# Patient Record
Sex: Female | Born: 1950 | Race: Black or African American | Hispanic: No | Smoking: Former smoker
Health system: Southern US, Community
[De-identification: ages and names within clinical notes are randomized; demographics above are authoritative.]

## PROBLEM LIST (undated history)

## (undated) DIAGNOSIS — K219 Gastro-esophageal reflux disease without esophagitis: Secondary | ICD-10-CM

## (undated) DIAGNOSIS — G4700 Insomnia, unspecified: Secondary | ICD-10-CM

## (undated) DIAGNOSIS — M542 Cervicalgia: Secondary | ICD-10-CM

## (undated) DIAGNOSIS — F4323 Adjustment disorder with mixed anxiety and depressed mood: Secondary | ICD-10-CM

## (undated) DIAGNOSIS — I10 Essential (primary) hypertension: Secondary | ICD-10-CM

## (undated) DIAGNOSIS — B353 Tinea pedis: Secondary | ICD-10-CM

## (undated) DIAGNOSIS — T7840XA Allergy, unspecified, initial encounter: Secondary | ICD-10-CM

## (undated) DIAGNOSIS — F32A Depression, unspecified: Secondary | ICD-10-CM

## (undated) DIAGNOSIS — R0989 Other specified symptoms and signs involving the circulatory and respiratory systems: Secondary | ICD-10-CM

## (undated) DIAGNOSIS — F329 Major depressive disorder, single episode, unspecified: Secondary | ICD-10-CM

## (undated) HISTORY — DX: Essential (primary) hypertension: I10

## (undated) HISTORY — DX: Tinea pedis: B35.3

## (undated) HISTORY — DX: Major depressive disorder, single episode, unspecified: F32.9

## (undated) HISTORY — DX: Gastro-esophageal reflux disease without esophagitis: K21.9

## (undated) HISTORY — DX: Other specified symptoms and signs involving the circulatory and respiratory systems: R09.89

## (undated) HISTORY — DX: Adjustment disorder with mixed anxiety and depressed mood: F43.23

## (undated) HISTORY — DX: Depression, unspecified: F32.A

## (undated) HISTORY — DX: Allergy, unspecified, initial encounter: T78.40XA

## (undated) HISTORY — DX: Cervicalgia: M54.2

## (undated) HISTORY — PX: HEMORRHOID SURGERY: SHX153

## (undated) HISTORY — DX: Insomnia, unspecified: G47.00

---

## 1982-06-20 HISTORY — PX: OTHER SURGICAL HISTORY: SHX169

## 1986-06-20 HISTORY — PX: ABDOMINAL HYSTERECTOMY: SHX81

## 1999-02-03 ENCOUNTER — Other Ambulatory Visit: Admission: RE | Admit: 1999-02-03 | Discharge: 1999-02-03 | Payer: Self-pay | Admitting: Obstetrics & Gynecology

## 2000-06-16 ENCOUNTER — Encounter: Admission: RE | Admit: 2000-06-16 | Discharge: 2000-06-16 | Payer: Self-pay | Admitting: Internal Medicine

## 2000-06-16 ENCOUNTER — Encounter: Payer: Self-pay | Admitting: Internal Medicine

## 2000-09-22 ENCOUNTER — Encounter: Admission: RE | Admit: 2000-09-22 | Discharge: 2000-09-22 | Payer: Self-pay | Admitting: Internal Medicine

## 2000-09-22 ENCOUNTER — Encounter: Payer: Self-pay | Admitting: Internal Medicine

## 2001-07-19 ENCOUNTER — Encounter: Payer: Self-pay | Admitting: Obstetrics & Gynecology

## 2001-07-19 ENCOUNTER — Encounter: Admission: RE | Admit: 2001-07-19 | Discharge: 2001-07-19 | Payer: Self-pay | Admitting: Internal Medicine

## 2002-11-27 ENCOUNTER — Encounter: Payer: Self-pay | Admitting: Internal Medicine

## 2002-11-27 ENCOUNTER — Encounter: Admission: RE | Admit: 2002-11-27 | Discharge: 2002-11-27 | Payer: Self-pay | Admitting: Internal Medicine

## 2003-06-02 ENCOUNTER — Encounter: Admission: RE | Admit: 2003-06-02 | Discharge: 2003-06-02 | Payer: Self-pay | Admitting: Gastroenterology

## 2003-06-06 ENCOUNTER — Ambulatory Visit (HOSPITAL_COMMUNITY): Admission: RE | Admit: 2003-06-06 | Discharge: 2003-06-06 | Payer: Self-pay | Admitting: Gastroenterology

## 2004-01-29 ENCOUNTER — Encounter: Admission: RE | Admit: 2004-01-29 | Discharge: 2004-01-29 | Payer: Self-pay | Admitting: Internal Medicine

## 2004-08-04 ENCOUNTER — Ambulatory Visit: Payer: Self-pay | Admitting: Family Medicine

## 2004-08-05 ENCOUNTER — Ambulatory Visit (HOSPITAL_COMMUNITY): Admission: RE | Admit: 2004-08-05 | Discharge: 2004-08-05 | Payer: Self-pay | Admitting: Family Medicine

## 2004-09-15 ENCOUNTER — Ambulatory Visit: Payer: Self-pay | Admitting: Family Medicine

## 2004-10-05 ENCOUNTER — Ambulatory Visit: Payer: Self-pay | Admitting: Family Medicine

## 2005-01-14 ENCOUNTER — Ambulatory Visit: Payer: Self-pay | Admitting: Family Medicine

## 2005-03-04 ENCOUNTER — Ambulatory Visit: Payer: Self-pay | Admitting: Family Medicine

## 2005-07-05 ENCOUNTER — Ambulatory Visit: Payer: Self-pay | Admitting: Family Medicine

## 2005-08-01 ENCOUNTER — Encounter: Admission: RE | Admit: 2005-08-01 | Discharge: 2005-08-01 | Payer: Self-pay | Admitting: Obstetrics & Gynecology

## 2005-09-27 ENCOUNTER — Ambulatory Visit: Payer: Self-pay | Admitting: Gastroenterology

## 2005-10-04 ENCOUNTER — Other Ambulatory Visit: Admission: RE | Admit: 2005-10-04 | Discharge: 2005-10-04 | Payer: Self-pay | Admitting: Obstetrics & Gynecology

## 2005-11-01 ENCOUNTER — Ambulatory Visit: Payer: Self-pay | Admitting: Family Medicine

## 2006-08-10 ENCOUNTER — Ambulatory Visit: Payer: Self-pay | Admitting: Family Medicine

## 2006-12-13 ENCOUNTER — Ambulatory Visit: Payer: Self-pay | Admitting: Family Medicine

## 2007-01-23 ENCOUNTER — Other Ambulatory Visit: Admission: RE | Admit: 2007-01-23 | Discharge: 2007-01-23 | Payer: Self-pay | Admitting: Otolaryngology

## 2007-02-09 ENCOUNTER — Ambulatory Visit: Payer: Self-pay | Admitting: Gastroenterology

## 2007-02-23 ENCOUNTER — Ambulatory Visit: Payer: Self-pay | Admitting: Gastroenterology

## 2007-06-21 ENCOUNTER — Encounter: Payer: Self-pay | Admitting: Family Medicine

## 2007-08-14 ENCOUNTER — Ambulatory Visit: Payer: Self-pay | Admitting: Family Medicine

## 2007-08-17 ENCOUNTER — Encounter: Payer: Self-pay | Admitting: Family Medicine

## 2007-08-17 LAB — CONVERTED CEMR LAB
BUN: 10 mg/dL (ref 6–23)
Basophils Absolute: 0 10*3/uL (ref 0.0–0.1)
Basophils Relative: 0 % (ref 0–1)
CO2: 25 meq/L (ref 19–32)
Calcium: 9.2 mg/dL (ref 8.4–10.5)
Chloride: 104 meq/L (ref 96–112)
Cholesterol: 140 mg/dL (ref 0–200)
Creatinine, Ser: 0.79 mg/dL (ref 0.40–1.20)
Eosinophils Absolute: 0.1 10*3/uL (ref 0.0–0.7)
Eosinophils Relative: 1 % (ref 0–5)
Glucose, Bld: 95 mg/dL (ref 70–99)
HCT: 42.3 % (ref 36.0–46.0)
HDL: 51 mg/dL (ref >39)
Hemoglobin: 13.9 g/dL (ref 12.0–15.0)
LDL Cholesterol: 74 mg/dL (ref 0–99)
Lymphocytes Relative: 31 % (ref 12–46)
Lymphs Abs: 1.6 10*3/uL (ref 0.7–4.0)
MCHC: 32.9 g/dL (ref 30.0–36.0)
MCV: 93.8 fL (ref 78.0–100.0)
Monocytes Absolute: 0.4 10*3/uL (ref 0.1–1.0)
Monocytes Relative: 7 % (ref 3–12)
Neutro Abs: 3.1 10*3/uL (ref 1.7–7.7)
Neutrophils Relative %: 61 % (ref 43–77)
Platelets: 228 10*3/uL (ref 150–400)
Potassium: 4.2 meq/L (ref 3.5–5.3)
RBC: 4.51 M/uL (ref 3.87–5.11)
RDW: 12.8 % (ref 11.5–15.5)
Sodium: 141 meq/L (ref 135–145)
Total CHOL/HDL Ratio: 2.7
Triglycerides: 77 mg/dL (ref <150)
VLDL: 15 mg/dL (ref 0–40)
WBC: 5.2 10*3/uL (ref 4.0–10.5)

## 2008-04-29 ENCOUNTER — Ambulatory Visit: Payer: Self-pay | Admitting: Family Medicine

## 2008-05-01 ENCOUNTER — Encounter: Payer: Self-pay | Admitting: Family Medicine

## 2008-08-12 ENCOUNTER — Ambulatory Visit: Payer: Self-pay | Admitting: Family Medicine

## 2008-08-12 DIAGNOSIS — I10 Essential (primary) hypertension: Secondary | ICD-10-CM | POA: Insufficient documentation

## 2008-08-12 DIAGNOSIS — K219 Gastro-esophageal reflux disease without esophagitis: Secondary | ICD-10-CM | POA: Insufficient documentation

## 2008-08-13 ENCOUNTER — Telehealth: Payer: Self-pay | Admitting: Family Medicine

## 2008-08-21 DIAGNOSIS — F4323 Adjustment disorder with mixed anxiety and depressed mood: Secondary | ICD-10-CM | POA: Insufficient documentation

## 2008-08-21 DIAGNOSIS — J309 Allergic rhinitis, unspecified: Secondary | ICD-10-CM | POA: Insufficient documentation

## 2008-08-26 ENCOUNTER — Encounter: Payer: Self-pay | Admitting: Family Medicine

## 2008-08-26 LAB — CONVERTED CEMR LAB
BUN: 12 mg/dL (ref 6–23)
Band Neutrophils: 0 % (ref 0–10)
Basophils Absolute: 0 10*3/uL (ref 0.0–0.1)
Basophils Relative: 1 % (ref 0–1)
CO2: 29 meq/L (ref 19–32)
Calcium: 9.4 mg/dL (ref 8.4–10.5)
Chloride: 102 meq/L (ref 96–112)
Cholesterol: 143 mg/dL (ref 0–200)
Creatinine, Ser: 0.76 mg/dL (ref 0.40–1.20)
Eosinophils Absolute: 0.2 10*3/uL (ref 0.0–0.7)
Eosinophils Relative: 4 % (ref 0–5)
Glucose, Bld: 91 mg/dL (ref 70–99)
HCT: 41.4 % (ref 36.0–46.0)
HDL: 50 mg/dL (ref >39)
Hemoglobin: 13.4 g/dL (ref 12.0–15.0)
LDL Cholesterol: 75 mg/dL (ref 0–99)
Lymphocytes Relative: 49 % — ABNORMAL HIGH (ref 12–46)
Lymphs Abs: 2.5 10*3/uL (ref 0.7–4.0)
MCHC: 32.4 g/dL (ref 30.0–36.0)
MCV: 93.5 fL (ref 78.0–100.0)
Monocytes Absolute: 0.5 10*3/uL (ref 0.1–1.0)
Monocytes Relative: 9 % (ref 3–12)
Neutro Abs: 1.9 10*3/uL (ref 1.7–7.7)
Neutrophils Relative %: 38 % — ABNORMAL LOW (ref 43–77)
Platelets: 226 10*3/uL (ref 150–400)
Potassium: 4.1 meq/L (ref 3.5–5.3)
RBC: 4.43 M/uL (ref 3.87–5.11)
RDW: 12.7 % (ref 11.5–15.5)
Sodium: 141 meq/L (ref 135–145)
Total CHOL/HDL Ratio: 2.9
Triglycerides: 90 mg/dL (ref <150)
VLDL: 18 mg/dL (ref 0–40)
Vit D, 25-Hydroxy: 33 ng/mL (ref 30–89)
WBC: 5 10*3/uL (ref 4.0–10.5)

## 2008-09-03 ENCOUNTER — Telehealth: Payer: Self-pay | Admitting: Family Medicine

## 2008-09-15 ENCOUNTER — Telehealth: Payer: Self-pay | Admitting: Family Medicine

## 2008-10-02 ENCOUNTER — Encounter: Payer: Self-pay | Admitting: Family Medicine

## 2008-10-02 DIAGNOSIS — K644 Residual hemorrhoidal skin tags: Secondary | ICD-10-CM | POA: Insufficient documentation

## 2008-10-02 DIAGNOSIS — K5900 Constipation, unspecified: Secondary | ICD-10-CM | POA: Insufficient documentation

## 2008-10-03 ENCOUNTER — Encounter: Payer: Self-pay | Admitting: Family Medicine

## 2008-10-03 ENCOUNTER — Ambulatory Visit: Payer: Self-pay | Admitting: Gastroenterology

## 2008-10-16 ENCOUNTER — Encounter: Payer: Self-pay | Admitting: Family Medicine

## 2008-11-04 ENCOUNTER — Encounter: Payer: Self-pay | Admitting: Family Medicine

## 2009-02-16 ENCOUNTER — Telehealth: Payer: Self-pay | Admitting: Family Medicine

## 2009-06-02 ENCOUNTER — Telehealth: Payer: Self-pay | Admitting: Family Medicine

## 2009-06-27 ENCOUNTER — Emergency Department (HOSPITAL_COMMUNITY): Admission: EM | Admit: 2009-06-27 | Discharge: 2009-06-27 | Payer: Self-pay | Admitting: Emergency Medicine

## 2009-07-09 ENCOUNTER — Ambulatory Visit: Payer: Self-pay | Admitting: Family Medicine

## 2009-07-09 DIAGNOSIS — F418 Other specified anxiety disorders: Secondary | ICD-10-CM | POA: Insufficient documentation

## 2009-07-09 DIAGNOSIS — F5105 Insomnia due to other mental disorder: Secondary | ICD-10-CM

## 2009-07-09 DIAGNOSIS — F419 Anxiety disorder, unspecified: Secondary | ICD-10-CM | POA: Insufficient documentation

## 2009-07-09 DIAGNOSIS — G4700 Insomnia, unspecified: Secondary | ICD-10-CM | POA: Insufficient documentation

## 2009-07-15 ENCOUNTER — Telehealth: Payer: Self-pay | Admitting: Family Medicine

## 2009-07-23 ENCOUNTER — Ambulatory Visit: Payer: Self-pay | Admitting: Family Medicine

## 2009-09-24 ENCOUNTER — Telehealth: Payer: Self-pay | Admitting: Family Medicine

## 2009-10-20 ENCOUNTER — Ambulatory Visit (HOSPITAL_COMMUNITY): Admission: RE | Admit: 2009-10-20 | Discharge: 2009-10-20 | Payer: Self-pay | Admitting: Obstetrics & Gynecology

## 2009-12-08 ENCOUNTER — Ambulatory Visit (HOSPITAL_COMMUNITY): Admission: RE | Admit: 2009-12-08 | Discharge: 2009-12-08 | Payer: Self-pay | Admitting: Family Medicine

## 2009-12-08 ENCOUNTER — Ambulatory Visit: Payer: Self-pay | Admitting: Family Medicine

## 2009-12-08 DIAGNOSIS — R5381 Other malaise: Secondary | ICD-10-CM | POA: Insufficient documentation

## 2009-12-08 DIAGNOSIS — R5383 Other fatigue: Secondary | ICD-10-CM | POA: Insufficient documentation

## 2009-12-08 DIAGNOSIS — M542 Cervicalgia: Secondary | ICD-10-CM | POA: Insufficient documentation

## 2009-12-08 DIAGNOSIS — R0989 Other specified symptoms and signs involving the circulatory and respiratory systems: Secondary | ICD-10-CM | POA: Insufficient documentation

## 2009-12-11 ENCOUNTER — Ambulatory Visit (HOSPITAL_COMMUNITY): Admission: RE | Admit: 2009-12-11 | Discharge: 2009-12-11 | Payer: Self-pay | Admitting: Family Medicine

## 2009-12-17 ENCOUNTER — Encounter: Payer: Self-pay | Admitting: Family Medicine

## 2009-12-31 LAB — CONVERTED CEMR LAB
BUN: 13 mg/dL (ref 6–23)
Basophils Absolute: 0 10*3/uL (ref 0.0–0.1)
Basophils Relative: 1 % (ref 0–1)
CO2: 26 meq/L (ref 19–32)
Calcium: 9.4 mg/dL (ref 8.4–10.5)
Chloride: 106 meq/L (ref 96–112)
Cholesterol: 171 mg/dL (ref 0–200)
Creatinine, Ser: 0.83 mg/dL (ref 0.40–1.20)
Eosinophils Absolute: 0.1 10*3/uL (ref 0.0–0.7)
Eosinophils Relative: 3 % (ref 0–5)
Glucose, Bld: 89 mg/dL (ref 70–99)
HCT: 37.5 % (ref 36.0–46.0)
HDL: 52 mg/dL (ref >39)
Hemoglobin: 12.8 g/dL (ref 12.0–15.0)
LDL Cholesterol: 107 mg/dL — ABNORMAL HIGH (ref 0–99)
Lymphocytes Relative: 56 % — ABNORMAL HIGH (ref 12–46)
Lymphs Abs: 2.6 10*3/uL (ref 0.7–4.0)
MCHC: 34.1 g/dL (ref 30.0–36.0)
MCV: 94 fL (ref 78.0–100.0)
Monocytes Absolute: 0.3 10*3/uL (ref 0.1–1.0)
Monocytes Relative: 7 % (ref 3–12)
Neutro Abs: 1.6 10*3/uL — ABNORMAL LOW (ref 1.7–7.7)
Neutrophils Relative %: 34 % — ABNORMAL LOW (ref 43–77)
Platelets: 211 10*3/uL (ref 150–400)
Potassium: 4.3 meq/L (ref 3.5–5.3)
RBC: 3.99 M/uL (ref 3.87–5.11)
RDW: 12.6 % (ref 11.5–15.5)
Sodium: 142 meq/L (ref 135–145)
TSH: 2.508 microintl units/mL (ref 0.350–4.500)
Total CHOL/HDL Ratio: 3.3
Triglycerides: 58 mg/dL (ref <150)
VLDL: 12 mg/dL (ref 0–40)
WBC: 4.6 10*3/uL (ref 4.0–10.5)

## 2010-01-12 ENCOUNTER — Telehealth: Payer: Self-pay | Admitting: Family Medicine

## 2010-04-15 ENCOUNTER — Ambulatory Visit: Payer: Self-pay | Admitting: Family Medicine

## 2010-06-04 ENCOUNTER — Ambulatory Visit: Payer: Self-pay | Admitting: Family Medicine

## 2010-06-04 DIAGNOSIS — M810 Age-related osteoporosis without current pathological fracture: Secondary | ICD-10-CM | POA: Insufficient documentation

## 2010-06-05 DIAGNOSIS — B353 Tinea pedis: Secondary | ICD-10-CM | POA: Insufficient documentation

## 2010-06-07 ENCOUNTER — Encounter: Payer: Self-pay | Admitting: Family Medicine

## 2010-06-09 ENCOUNTER — Encounter: Payer: Self-pay | Admitting: Family Medicine

## 2010-06-23 ENCOUNTER — Ambulatory Visit (HOSPITAL_COMMUNITY)
Admission: RE | Admit: 2010-06-23 | Discharge: 2010-06-23 | Payer: Self-pay | Source: Home / Self Care | Attending: Family Medicine | Admitting: Family Medicine

## 2010-06-23 ENCOUNTER — Encounter: Payer: Self-pay | Admitting: Family Medicine

## 2010-06-29 ENCOUNTER — Encounter: Payer: Self-pay | Admitting: Family Medicine

## 2010-07-11 ENCOUNTER — Encounter: Payer: Self-pay | Admitting: Obstetrics & Gynecology

## 2010-07-22 NOTE — Letter (Signed)
Summary: phone notes  phone notes   Imported By: Curtis Sites 11/23/2009 10:00:42  _____________________________________________________________________  External Attachment:    Type:   Image     Comment:   External Document

## 2010-07-22 NOTE — Progress Notes (Signed)
Summary: REFILL/ NO REFILLS  Phone Note Call from Patient   Summary of Call: WANTS HER CLORAZEPATE 7.5 MG FILLED AT [facility] IN [place] CALL HER BACK TO LET HER KNOW Initial call taken by: Lind Guest,  February 16, 2009 1:30 PM  Follow-up for Phone Call        Phone Call Completed Follow-up by: Worthy Keeler LPN,  February 16, 2009 1:45 PM

## 2010-07-22 NOTE — Letter (Signed)
Summary: progress notes  progress notes   Imported By: Curtis Sites 11/23/2009 10:00:59  _____________________________________________________________________  External Attachment:    Type:   Image     Comment:   External Document

## 2010-07-22 NOTE — Miscellaneous (Signed)
Summary: Refill  Clinical Lists Changes  Medications: Added new medication of ACIPHEX 20 MG TBEC (RABEPRAZOLE SODIUM) Take 1 tablet by mouth once a day - Signed Added new medication of KEFLEX 500 MG CAPS (CEPHALEXIN) Take 1 tablet by mouth two times a day - Signed Rx of ACIPHEX 20 MG TBEC (RABEPRAZOLE SODIUM) Take 1 tablet by mouth once a day;  #30 x 3;  Signed;  Entered by: Everitt Amber;  Authorized by: Syliva Overman MD;  Method used: Handwritten Rx of KEFLEX 500 MG CAPS (CEPHALEXIN) Take 1 tablet by mouth two times a day;  #14 x 0;  Signed;  Entered by: Everitt Amber;  Authorized by: Syliva Overman MD;  Method used: Handwritten    Prescriptions: KEFLEX 500 MG CAPS (CEPHALEXIN) Take 1 tablet by mouth two times a day  #14 x 0   Entered by:   Everitt Amber   Authorized by:   Syliva Overman MD   Signed by:   Everitt Amber on 05/01/2008   Method used:   Handwritten   RxID:   [id number] ACIPHEX 20 MG TBEC (RABEPRAZOLE SODIUM) Take 1 tablet by mouth once a day  #30 x 3   Entered by:   Everitt Amber   Authorized by:   Syliva Overman MD   Signed by:   Everitt Amber on 05/01/2008   Method used:   Handwritten   RxID:   [id number]

## 2010-07-22 NOTE — Progress Notes (Signed)
  Phone Note Call from Patient   Caller: Patient Summary of Call: patient wants to know if you can increase her dose of prozac to 40mg  Initial call taken by: Adella Hare LPN,  January 12, 2010 2:43 PM  Follow-up for Phone Call        advise will inc the dose , but sh should also consider seeing a therapist , and will cerainly need an ov by sept, pls fax in writing dose inc the new dose Follow-up by: Syliva Overman MD,  January 12, 2010 3:52 PM  Additional Follow-up for Phone Call Additional follow up Details #1::        called patient, no answer Additional Follow-up by: Everitt Amber LPN,  January 12, 2010 4:37 PM    Additional Follow-up for Phone Call Additional follow up Details #2::    called patient, busy signal Follow-up by: Adella Hare LPN,  January 13, 2010 9:54 AM  Additional Follow-up for Phone Call Additional follow up Details #3:: Details for Additional Follow-up Action Taken: patient aware Additional Follow-up by: Adella Hare LPN,  January 13, 2010 3:02 PM  New/Updated Medications: FLUOXETINE HCL 40 MG CAPS (FLUOXETINE HCL) Take 1 capsule by mouth once a day Prescriptions: FLUOXETINE HCL 40 MG CAPS (FLUOXETINE HCL) Take 1 capsule by mouth once a day  #30 x 2   Entered and Authorized by:   Syliva Overman MD   Signed by:   Syliva Overman MD on 01/12/2010   Method used:   Printed then faxed to ...       [facility] [facility] (retail)       [address]       [place], [place]  [address]       Ph: [phone]       Fax: [phone]   RxID:   [id number]

## 2010-07-22 NOTE — Progress Notes (Signed)
  Phone Note Call from Patient   Caller: Patient Summary of Call: having anxiety during the day  [facility]. [phone] Initial call taken by: Worthy Keeler LPN,  July 15, 2009 4:13 PM  Follow-up for Phone Call        pls fax to [facility] , i discussed with pt she wants the tranxene for anxiety, and prob twice daily, same sent in Follow-up by: Syliva Overman MD,  July 16, 2009 6:25 PM

## 2010-07-22 NOTE — Assessment & Plan Note (Signed)
Summary: office visit   Vital Signs:  Patient profile:   60 year old female Height:      68 inches Weight:      184.25 pounds BMI:     28.12 O2 Sat:      98 % on Room air Pulse rate:   80 / minute Pulse rhythm:   regular Resp:     16 per minute BP sitting:   120 / 80  (left arm)  Vitals Entered By: Worthy Keeler LPN (July 09, 2009 10:06 AM)  Nutrition Counseling: Patient's BMI is greater than 25 and therefore counseled on weight management options.  O2 Flow:  Room air CC: follow-up visit Is Patient Diabetic? No Pain Assessment Patient in pain? no        Primary Care Provider:  Syliva Overman MD  CC:  follow-up visit.  History of Present Illness: states sjhe was taking extra asprin for a short period of time, wonders if this is a problem. Not taking BP meds regularly. Nort sleeping well and had not been taing clorazepate regularly.  Overall stress with dealing with her Momis worsening, she reports frank depression, but denies suividl or homicidal thoughts or hallucinations.She reports continued deterioratin in her living sitation, sometimes driving out and parking in a lot to eat, so she can have mental peace. she has not been exercising regulsrly , but sees this as a way to improve both physical and mental heah  Was  in the ED recently, reportedly not feelig well , she was told bP was high.  Home BP 184/108, hosp 162/108, then 142/94  Current Medications (verified): 1)  Tranxene-T 7.5 Mg Tabs (Clorazepate Dipotassium) .... One Tab By Mouth Qhs 2)  Flonase 50 Mcg/act Susp (Fluticasone Propionate) .... Uad 3)  Hydrochlorothiazide 25 Mg Tabs (Hydrochlorothiazide) .... One Tab By Mouth Qd 4)  Klor-Con M10 10 Meq Cr-Tabs (Potassium Chloride Crys Cr) .... One Tab By Mouth Qd 5)  Miralax   Powd (Polyethylene Glycol 3350) .... Mix One Capful in 8 Ounces of Water At Bedtime 6)  Ranitidine Hcl 150 Mg Tabs (Ranitidine Hcl) .... One Tab By Mouth Bid 7)  Aspirin 81 Mg Tabs  (Aspirin) .... One Tab By Mouth Qd  Allergies (verified): No Known Drug Allergies  Review of Systems      See HPI General:  Denies chills and fever. Eyes:  Denies blurring and discharge. ENT:  Denies hoarseness. CV:  Denies chest pain or discomfort, palpitations, and swelling of feet. Resp:  Denies cough and sputum productive. GI:  Denies abdominal pain, constipation, diarrhea, nausea, and vomiting. GU:  Denies dysuria and urinary frequency. Neuro:  Denies headaches, seizures, and sensation of room spinning. Psych:  Complains of anxiety and depression; denies sense of great danger, suicidal thoughts/plans, thoughts of violence, and unusual visions or sounds. Endo:  Denies cold intolerance, excessive hunger, excessive thirst, excessive urination, heat intolerance, polyuria, and weight change. Heme:  Denies abnormal bruising. Allergy:  Complains of seasonal allergies.  Physical Exam  General:  Well-developed,well-nourished,in no acute distress; alert,appropriate and cooperative throughout examination HEENT: No facial asymmetry,  EOMI, No sinus tenderness, TM's Clear, oropharynx  pink and moist.   Chest: Clear to auscultation bilaterally.  CVS: S1, S2, No murmurs, No S3.   Abd: Soft, Nontender.  MS: Adequate ROM spine, hips, shoulders and knees.  Ext: No edema.   CNS: CN 2-12 intact, power tone and sensation normal throughout.   Skin: Intact, no visible lesions or rashes.  Psych: Good eye  contact, normal affect.  Memory intact,  anxious and depressed appearing.    Impression & Recommendations:  Problem # 1:  INSOMNIA (ICD-780.52) Assessment Deteriorated  The following medications were removed from the medication list:    Temazepam 15 Mg Caps (Temazepam) .Marland Kitchen... Take 1 tab by mouth at bedtime  Discussed sleep hygiene. After the visit, pt called back stating that anxiety was her bigger problem requesting inc on tranxene instead of tamezepam.  Problem # 2:  DEPRESSION  (ICD-311) Assessment: Deteriorated  The following medications were removed from the medication list:    Tranxene-t 7.5 Mg Tabs (Clorazepate dipotassium) ..... One tab by mouth qhs Her updated medication list for this problem includes:    Prozac 10 Mg Caps (Fluoxetine hcl) .Marland Kitchen... Take 1 tablet by mouth once a day    Tranxene-t 7.5 Mg Tabs (Clorazepate dipotassium) .Marland Kitchen... Take 1 tablet by mouth two times a day as needed  Problem # 3:  HYPERTENSION (ICD-401.9) Assessment: Unchanged  Her updated medication list for this problem includes:    Hydrochlorothiazide 25 Mg Tabs (Hydrochlorothiazide) ..... One tab by mouth qd  BP today: 120/80 Prior BP: 110/70 (10/03/2008)  Labs Reviewed: K+: 4.1 (08/26/2008) Creat: : 0.76 (08/26/2008)   Chol: 143 (08/26/2008)   HDL: 50 (08/26/2008)   LDL: 75 (08/26/2008)   TG: 90 (08/26/2008)  Problem # 4:  ALLERGIC RHINITIS CAUSE UNSPECIFIED (ICD-477.9) Assessment: Unchanged  Her updated medication list for this problem includes:    Flonase 50 Mcg/act Susp (Fluticasone propionate) ..... Uad  Problem # 5:  GERD (ICD-530.81) Assessment: Unchanged  The following medications were removed from the medication list:    Omeprazole 20 Mg Cpdr (Omeprazole) ..... One tab by mouth bid    Nexium 40 Mg Cpdr (Esomeprazole magnesium) .Marland Kitchen... Take 1 tablet by mouth once a day Her updated medication list for this problem includes:    Ranitidine Hcl 150 Mg Tabs (Ranitidine hcl) ..... One tab by mouth bid  Complete Medication List: 1)  Flonase 50 Mcg/act Susp (Fluticasone propionate) .... Uad 2)  Hydrochlorothiazide 25 Mg Tabs (Hydrochlorothiazide) .... One tab by mouth qd 3)  Klor-con M10 10 Meq Cr-tabs (Potassium chloride crys cr) .... One tab by mouth qd 4)  Miralax Powd (Polyethylene glycol 3350) .... Mix one capful in 8 ounces of water at bedtime 5)  Ranitidine Hcl 150 Mg Tabs (Ranitidine hcl) .... One tab by mouth bid 6)  Aspirin 81 Mg Tabs (Aspirin) .... One tab by  mouth qd 7)  Prozac 10 Mg Caps (Fluoxetine hcl) .... Take 1 tablet by mouth once a day 8)  Tranxene-t 7.5 Mg Tabs (Clorazepate dipotassium) .... Take 1 tablet by mouth two times a day as needed  Patient Instructions: 1)  Please schedule a follow-up appointment in 4 months. 2)  It is important that you exercise regularly at least 20 minutes 5 times a week. If you develop chest pain, have severe difficulty breathing, or feel very tired , stop exercising immediately and seek medical attention. 3)  You need to lose weight. Consider a lower calorie diet and regular exercise.  Prescriptions: TRANXENE-T 7.5 MG TABS (CLORAZEPATE DIPOTASSIUM) Take 1 tablet by mouth two times a day as needed  #60 x 3   Entered and Authorized by:   Syliva Overman MD   Signed by:   Syliva Overman MD on 07/16/2009   Method used:   Printed then faxed to ...       [facility] (retail)  [address]       [place], [place]  [address]       Ph: [phone]       Fax: [phone]   RxID:   [id number] PROZAC 10 MG CAPS (FLUOXETINE HCL) Take 1 tablet by mouth once a day  #30 x 3   Entered by:   Everitt Amber   Authorized by:   Syliva Overman MD   Signed by:   Everitt Amber on 07/09/2009   Method used:   Handwritten   RxID:   [id number] TEMAZEPAM 15 MG CAPS (TEMAZEPAM) Take 1 tab by mouth at bedtime  #30 x 3   Entered by:   Everitt Amber   Authorized by:   Syliva Overman MD   Signed by:   Everitt Amber on 07/09/2009   Method used:   Handwritten   RxID:   [id number]

## 2010-07-22 NOTE — Progress Notes (Signed)
Summary: CALL BACK  Phone Note Call from Patient   Summary of Call: NEEDS FOR YOU TO CALL HER AT [phone] ABOUT MEDICINE Initial call taken by: Lind Guest,  September 24, 2009 11:26 AM  Follow-up for Phone Call        wants to know if she should be on an antibiotic for preventative measures against strep throat, since mother has had strep throat Follow-up by: Adella Hare LPN,  September 24, 2009 2:18 PM  Additional Follow-up for Phone Call Additional follow up Details #1::        not necessary, mother got rocephin inj in the office Additional Follow-up by: Syliva Overman MD,  September 24, 2009 3:01 PM    Additional Follow-up for Phone Call Additional follow up Details #2::    patient aware Follow-up by: Adella Hare LPN,  September 24, 2009 4:54 PM

## 2010-07-22 NOTE — Medication Information (Signed)
Summary: Tax adviser   Imported By: Lind Guest 10/16/2008 08:03:06  _____________________________________________________________________  External Attachment:    Type:   Image     Comment:   External Document

## 2010-07-22 NOTE — Letter (Signed)
Summary: misc.  misc.   Imported By: Curtis Sites 11/23/2009 10:00:24  _____________________________________________________________________  External Attachment:    Type:   Image     Comment:   External Document

## 2010-07-22 NOTE — Progress Notes (Signed)
Summary: meds  Phone Note Call from Patient   Summary of Call: has a question about her meds and needs to talk with nurse. [phone] Initial call taken by: Rudene Anda,  July 15, 2009 1:31 PM  Follow-up for Phone Call        advised patient to take meds at the same time daily Follow-up by: Worthy Keeler LPN,  July 15, 2009 4:11 PM

## 2010-07-22 NOTE — Progress Notes (Signed)
Summary: nurse  Phone Note Call from Patient   Summary of Call: needs to speak with nurse about some test. [phone] Initial call taken by: Rudene Anda,  June 02, 2009 4:04 PM  Follow-up for Phone Call        She wants to know what you think about the [employer] checks and if you think she should have it done. Its an organization where they travel around to churches etc and perform tests (one test checks for blockages in arteries) Wants to know if you think she should get one done. Told her I would ask you and call her back Follow-up by: Everitt Amber,  June 02, 2009 4:12 PM  Additional Follow-up for Phone Call Additional follow up Details #1::        yes I think it is a good screening test Additional Follow-up by: Syliva Overman MD,  June 03, 2009 9:24 AM    Additional Follow-up for Phone Call Additional follow up Details #2::    called pt, left message Follow-up by: Everitt Amber,  June 03, 2009 10:40 AM  Additional Follow-up for Phone Call Additional follow up Details #3:: Details for Additional Follow-up Action Taken: Patient aware Additional Follow-up by: Everitt Amber,  June 03, 2009 1:57 PM

## 2010-07-22 NOTE — Procedures (Signed)
Summary: Colon   Colonoscopy  Procedure date:  02/23/2007  Findings:      Location:  [facility].    Colonoscopy  Procedure date:  02/23/2007  Findings:      Location:  [facility].    Patient Name: Tiffany Mendoza, Tiffany Mendoza MRN:  Procedure Procedures: Colonoscopy CPT: [phone].  Personnel: Endoscopist: Vania Rea. Jarold Motto, MD.  Exam Location: Exam performed in Outpatient Clinic. Outpatient  Patient Consent: Procedure, Alternatives, Risks and Benefits discussed, consent obtained, from patient. Consent was obtained by the RN.  Indications Symptoms: Constipation Patient has difficulty evacuating, strains with stool passage. Hematochezia.  Increased Risk Screening: Family History of Polyps.  History  Current Medications: Patient is not currently taking Coumadin.  Medical/ Surgical History: Hypertension, Reflux Disease,  Pre-Exam Physical: Performed Feb 23, 2007. Cardio-pulmonary exam, Rectal exam, Abdominal exam, Extremity exam, Mental status exam WNL.  Comments: Pt. history reviewed/updated, physical exam performed prior to initiation of sedation? yes Exam Exam: Extent of exam reached: Cecum, extent intended: Cecum.  The cecum was identified by appendiceal orifice and IC valve. Patient position: on left side. Time to Cecum: 00:11. Time for Withdrawl: 00:08:18. Colon retroflexion performed. Images taken. ASA Classification: II. Tolerance: excellent.  Monitoring: Pulse and BP monitoring, Oximetry used. Supplemental O2 given. at 2 Liters.  Colon Prep Used Golytely for colon prep. Prep results: excellent.  Sedation Meds: Patient assessed and found to be appropriate for moderate (conscious) sedation. Sedation was managed by the Endoscopist. Monitored Anesthesia Care. Fentanyl 100 mcg. given IV. Versed 10 mg. given IV.  Instrument(s): CF 140L. Serial [id number].  Comments: Very redundant and tortuous colon noted.... Findings - NORMAL EXAM:  Cecum to Rectum. Not Seen: Polyps. AVM's. Colitis. Tumors. Melanosis. Crohn's. Diverticulosis.  - HEMORRHOIDS: External. Size: Medium. Not bleeding. Not thrombosed. ICD9: Hemorrhoids, External: 455.3.   Assessment  Diagnoses: 455.3: Hemorrhoids, External.   Comments: Chronic functional constipation.and external hemorrhoidal periodic bleeding,.... Events  Unplanned Interventions: No intervention was required.  Plans Medication Plan: Anti-constipation: Amitiza BID, starting Feb 23, 2007 for 7 days.   Patient Education: Patient given standard instructions for: Constipation. Comments: Local anal cream... Disposition: After procedure patient sent to recovery. After recovery patient sent home.  Scheduling/Referral: Follow-Up prn.    CC: Syliva Overman, MD  This report was created from the original endoscopy report, which was reviewed and signed by the above listed endoscopist.

## 2010-07-22 NOTE — Letter (Signed)
Summary: consult  consult   Imported By: Curtis Sites 11/23/2009 09:59:14  _____________________________________________________________________  External Attachment:    Type:   Image     Comment:   External Document

## 2010-07-22 NOTE — Assessment & Plan Note (Signed)
Summary: office visit   Vital Signs:  Patient profile:   60 year old female Height:      68 inches Weight:      174.50 pounds BMI:     26.63 O2 Sat:      96 % Pulse rate:   73 / minute Pulse rhythm:   regular Resp:     16 per minute BP sitting:   124 / 86  (left arm)  Vitals Entered By: Everitt Amber LPN (December 08, 2009 10:55 AM)  Nutrition Counseling: Patient's BMI is greater than 25 and therefore counseled on weight management options. CC: Follow up chronic problems, do you still want her to take the prozac. She couldn't tell a difference really since maybe it was a low dose.    Primary Care Provider:  Syliva Overman MD  CC:  Follow up chronic problems and do you still want her to take the prozac. She couldn't tell a difference really since maybe it was a low dose. Marland Kitchen  History of Present Illness: Reports  that tshe has been doing fairly well. 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 Denies sinus pressure, nasal congestion , ear pain or sore throat. Denies chest congestion, or cough productive of sputum. Denies chest pain, palpitations, PND, orthopnea or leg swelling. Denies abdominal pain, nausea, vomitting, diarrhea or constipation. Denies change in bowel movements or bloody stool. Denies dysuria , frequency, incontinence or hesitancy. Denies  joint pain, swelling, or reduced mobility. Denies headaches, vertigo, seizures.  Denies  rash, lesions, or itch.     Current Medications (verified): 1)  Hydrochlorothiazide 25 Mg Tabs (Hydrochlorothiazide) .... One Tab By Mouth Qd 2)  Potassium 99 Mg Tabs (Potassium) .... Take 1 Tablet By Mouth Once A Day 3)  Ranitidine Hcl 150 Mg Tabs (Ranitidine Hcl) .... One Tab By Mouth Bid 4)  Aspirin 81 Mg Tabs (Aspirin) .... One Tab By Mouth Qd 5)  Prozac 10 Mg Caps (Fluoxetine Hcl) .... Take 1 Tablet By Mouth Once A Day 6)  Tranxene-T 7.5 Mg Tabs  (Clorazepate Dipotassium) .... Take 1 Tablet By Mouth Two Times A Day As Needed  Allergies (verified): No Known Drug Allergies  Review of Systems      See HPI General:  Complains of fatigue. Eyes:  Denies blurring and discharge. MS:  Complains of joint pain and stiffness; 4 week h/o ioncreased neck pain and stiffness, concerned about possible blockage in her neck also. Psych:  Complains of anxiety and depression; denies suicidal thoughts/plans, thoughts of violence, unusual visions or sounds, and thoughts /plans of harming others; improived symptoms, however feels as though she would do better with an increased dose of med. Endo:  Denies excessive thirst and excessive urination. Heme:  Denies abnormal bruising and bleeding. Allergy:  Complains of seasonal allergies; denies hives or rash and itching eyes; mild to moderate, controlled with as needed meds.  Physical Exam  General:  Well-developed,well-nourished,in no acute distress; alert,appropriate and cooperative throughout examination HEENT: No facial asymmetry,  EOMI, No sinus tenderness, TM's Clear, oropharynx  pink and moist. Carotid bruit  Chest: Clear to auscultation bilaterally.  CVS: S1, S2, No murmurs, No S3.   Abd: Soft, Nontender.  MS: Adequate ROM spine, hips, shoulders and knees.  Ext: No edema.   CNS: CN 2-12 intact, power tone and sensation normal throughout.   Skin: Intact, no visible lesions or rashes.  Psych: Good eye contact,  normal affect.  Memory intact, not anxious or depressed appearing.'    Impression & Recommendations:  Problem # 1:  NECK PAIN (ICD-723.1) Assessment Deteriorated  Her updated medication list for this problem includes:    Aspirin 81 Mg Tabs (Aspirin) ..... One tab by mouth qd  Orders: Diagnostic X-Ray/Fluoroscopy (Diagnostic X-Ray/Flu)  Problem # 2:  CAROTID BRUIT (ICD-785.9) Assessment: Deteriorated  Orders: Radiology Referral (Radiology)  Problem # 3:  DEPRESSION  (ICD-311) Assessment: Improved  The following medications were removed from the medication list:    Prozac 10 Mg Caps (Fluoxetine hcl) .Marland Kitchen... Take 1 tablet by mouth once a day Her updated medication list for this problem includes:    Tranxene-t 7.5 Mg Tabs (Clorazepate dipotassium) .Marland Kitchen... Take 1 tablet by mouth two times a day as needed    Fluoxetine Hcl 20 Mg Caps (Fluoxetine hcl) .Marland Kitchen... Take 1 capsule by mouth once a day  Problem # 4:  INSOMNIA (ICD-780.52) Assessment: Unchanged  Discussed sleep hygiene. Relies on tranxene  Problem # 5:  ALLERGIC RHINITIS CAUSE UNSPECIFIED (ICD-477.9) Assessment: Deteriorated  The following medications were removed from the medication list:    Flonase 50 Mcg/act Susp (Fluticasone propionate) ..... Uad  Problem # 6:  HYPERTENSION (ICD-401.9) Assessment: Unchanged  Her updated medication list for this problem includes:    Hydrochlorothiazide 25 Mg Tabs (Hydrochlorothiazide) ..... One tab by mouth qd  Orders: T-Basic Metabolic Panel [phone])  BP today: 124/86 Prior BP: 120/80 (07/23/2009)  Labs Reviewed: K+: 4.1 (08/26/2008) Creat: : 0.76 (08/26/2008)   Chol: 143 (08/26/2008)   HDL: 50 (08/26/2008)   LDL: 75 (08/26/2008)   TG: 90 (08/26/2008)  Problem # 7:  GERD (ICD-530.81) Assessment: Improved  Her updated medication list for this problem includes:    Ranitidine Hcl 150 Mg Tabs (Ranitidine hcl) ..... One tab by mouth bid  Complete Medication List: 1)  Hydrochlorothiazide 25 Mg Tabs (Hydrochlorothiazide) .... One tab by mouth qd 2)  Potassium 99 Mg Tabs (Potassium) .... Take 1 tablet by mouth once a day 3)  Ranitidine Hcl 150 Mg Tabs (Ranitidine hcl) .... One tab by mouth bid 4)  Aspirin 81 Mg Tabs (Aspirin) .... One tab by mouth qd 5)  Tranxene-t 7.5 Mg Tabs (Clorazepate dipotassium) .... Take 1 tablet by mouth two times a day as needed 6)  Fluoxetine Hcl 20 Mg Caps (Fluoxetine hcl) .... Take 1 capsule by mouth once a day  Other  Orders: T-Lipid Profile ([id number]) T-CBC w/Diff ([id number]) T-TSH [id number])  Patient Instructions: 1)  Please schedule a follow-up appointment in 4.5 months. 2)  BMP prior to visit, ICD-9: 3)  Lipid Panel prior to visit, ICD-9:   fasting labs  this week 4)  CBC w/ Diff prior to visit, ICD-9: 5)  TSH 6)  Dose increase on the prozac as discussed. 7)  It is important that you exercise regularly at least30 minutes 5 times a week. If you develop chest pain, have severe difficulty breathing, or feel very tired , stop exercising immediately and seek medical attention. 8)  You need to lose weight. Consider a lower calorie diet and regular exercise. Congrats on your weight loss, plas keeop it up! Prescriptions: HYDROCHLOROTHIAZIDE 25 MG TABS (HYDROCHLOROTHIAZIDE) one tab by mouth qd  #30 Each x 4   Entered by:   Everitt Amber LPN   Authorized by:   Syliva Overman MD   Signed by:   Everitt Amber LPN on 28/41/3244   Method used:   Electronically to  [facility] [facility] (retail)       [address]       [place], [place]  [address]       Ph: [phone]       Fax: [phone]   RxID:   [id number] FLUOXETINE HCL 20 MG CAPS (FLUOXETINE HCL) Take 1 capsule by mouth once a day  #30 x 3   Entered and Authorized by:   Syliva Overman MD   Signed by:   Syliva Overman MD on 12/08/2009   Method used:   Printed then faxed to ...       [facility] [facility] (retail)       [address]       [place], [place]  [address]       Ph: [phone]       Fax: [phone]   RxID:   [id number]

## 2010-07-22 NOTE — Assessment & Plan Note (Signed)
Summary: OV   Vital Signs:  Patient profile:   60 year old female Height:      68 inches Weight:      165.25 pounds BMI:     25.22 O2 Sat:      98 % on Room air Pulse rate:   95 / minute Pulse rhythm:   regular Resp:     16 per minute BP sitting:   120 / 80  (left arm)  Vitals Entered By: Adella Hare LPN (June 04, 2010 11:36 AM)  Nutrition Counseling: Patient's BMI is greater than 25 and therefore counseled on weight management options.  O2 Flow:  Room air CC: follow-up visit Is Patient Diabetic? No Comments did not bring meds to ov   Primary Care Provider:  Syliva Overman MD  CC:  follow-up visit.  History of Present Illness: Reports  that thshe has been doing well. 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 Denies sinus pressure, nasal congestion , ear pain or sore throat. Denies chest congestion, or cough productive of sputum. Denies chest pain, palpitations, PND, orthopnea or leg swelling. Denies abdominal pain, nausea, vomitting, diarrhea or constipation. Denies change in bowel movements or bloody stool. Denies dysuria , frequency, incontinence or hesitancy. Denies  joint pain, swelling, or reduced mobility. Denies headaches, vertigo, seizures. Denies depression, anxiety or insomnia. c/o puritic peeling rash on the soles of both feet   Allergies: No Known Drug Allergies  Review of Systems      See HPI Eyes:  Denies discharge, eye pain, and red eye. Derm:  smelly feet wityh scaling in the past 1 month. Endo:  Denies cold intolerance, excessive hunger, excessive thirst, excessive urination, and heat intolerance. Heme:  Denies abnormal bruising and bleeding. Allergy:  Denies hives or rash and itching eyes.  Physical Exam  General:  Well-developed,well-nourished,in no acute distress; alert,appropriate and cooperative throughout examination HEENT: No facial asymmetry,  EOMI, No sinus  tenderness, TM's Clear, oropharynx  pink and moist.   Chest: Clear to auscultation bilaterally.  CVS: S1, S2, No murmurs, No S3.   Abd: Soft, Nontender.  MS: Adequate ROM spine, hips, shoulders and knees.  Ext: No edema.   CNS: CN 2-12 intact, power tone and sensation normal throughout.   Skin: tinea pedis bilaterally affecting the soles of both feet, scratch marks visible where pt has been digging and scratching Psych: Good eye contact, normal affect.  Memory intact, not anxious or depressed appearing.    Impression & Recommendations:  Problem # 1:  UNSPECIFIED OSTEOPOROSIS (ICD-733.00) Assessment Comment Only  Orders: Radiology Referral (Radiology)  Problem # 2:  INSOMNIA (ICD-780.52) Assessment: Improved  Discussed sleep hygiene.   Problem # 3:  DEPRESSION (ICD-311) Assessment: Improved  Her updated medication list for this problem includes:    Tranxene-t 7.5 Mg Tabs (Clorazepate dipotassium) .Marland Kitchen... Take 1 tablet by mouth two times a day as needed    Fluoxetine Hcl 40 Mg Caps (Fluoxetine hcl) .Marland Kitchen... Take 1 capsule by mouth once a day  Problem # 4:  HYPERTENSION (ICD-401.9) Assessment: Unchanged  Her updated medication list for this problem includes:    Hydrochlorothiazide 25 Mg Tabs (Hydrochlorothiazide) ..... One tab by mouth once daily.  BP today: 120/80 Prior BP: 124/84 (04/15/2010)  Labs Reviewed: K+: 4.3 (12/10/2009) Creat: : 0.83 (12/10/2009)   Chol: 171 (12/10/2009)   HDL: 52 (12/10/2009)   LDL: 107 (12/10/2009)   TG: 58 (12/10/2009)  Problem # 5:  GERD (ICD-530.81) Assessment: Improved  Her updated medication list for this problem includes:    Ranitidine Hcl 150 Mg Tabs (Ranitidine hcl) ..... One tab by mouth two times a day.  Problem # 6:  DERMATOPHYTOSIS OF FOOT (ICD-110.4) Assessment: Comment Only  Her updated medication list for this problem includes:    Clotrimazole 1 % Crea (Clotrimazole) .Marland Kitchen... Apply twice daily to soles of feet for 4 weeks, then  as needed  Complete Medication List: 1)  Hydrochlorothiazide 25 Mg Tabs (Hydrochlorothiazide) .... One tab by mouth once daily. 2)  Potassium 99 Mg Tabs (Potassium) .... Take 1 tablet by mouth once a day 3)  Ranitidine Hcl 150 Mg Tabs (Ranitidine hcl) .... One tab by mouth two times a day. 4)  Aspirin 81 Mg Tabs (Aspirin) .... One tab by mouth once daily. 5)  Tranxene-t 7.5 Mg Tabs (Clorazepate dipotassium) .... Take 1 tablet by mouth two times a day as needed 6)  Fluoxetine Hcl 40 Mg Caps (Fluoxetine hcl) .... Take 1 capsule by mouth once a day 7)  B Complex  .Marland KitchenMarland Kitchen. 1 tab daily 8)  Vitamin C 500 Mg  .Marland Kitchen.. 1 tab daily 9)  Omega 3- Fish Oil  .Marland Kitchen.. 1 tab daily 10)  Vitamin D3 1000 Iu  .Marland Kitchen.. 1 tab daily 11)  Magnesium 200 Mg Tabs (Magnesium) .Marland Kitchen.. 1 tab daily 12)  Miralax  .... Before bedtime 13)  Psyllium Husk Powd (Psyllium husk) .... Taken daily 14)  Clotrimazole 1 % Crea (Clotrimazole) .... Apply twice daily to soles of feet for 4 weeks, then as needed  Other Orders: Gynecologic Referral (Gyn) TB Skin Test [phone]) Admin 1st Vaccine (30865)  Patient Instructions: 1)  F/U as before. 2)  You have fungal infection  of the soles of your feet. 3)  Pls stop picking your skin. 4)  Change your shoes and also change your socks every day. 5)  Pls use a drying foot powder dr Oletta Cohn for example every day 6)  Med is sent to your pharmacy  Prescriptions: CLOTRIMAZOLE 1 % CREA (CLOTRIMAZOLE) apply twice daily to soles of feet for 4 weeks, then as needed  #60 gm x 1   Entered and Authorized by:   Syliva Overman MD   Signed by:   Syliva Overman MD on 06/04/2010   Method used:   Electronically to        [facility]       [address]       [place], [place]  [address]       Ph: [phone]       Fax: [phone]   RxID:   [id number]    Orders Added: 1)  Est. Patient Level III [75643] 2)  Gynecologic Referral [Gyn] 3)  Radiology Referral  [Radiology] 4)  TB Skin Test [86580] 5)  Admin 1st Vaccine [32951]   Immunizations Administered:  PPD Skin Test:    Vaccine Type: PPD    Site: left forearm    Mfr: Sanofi Pasteur    Dose: 0.1 ml    Route: ID    Given by: Adella Hare LPN    Exp. Date: 04/22/2011    Lot #: [id number]   Immunizations Administered:  PPD Skin Test:    Vaccine Type: PPD    Site: left forearm    Mfr: Sanofi Pasteur    Dose: 0.1 ml    Route: ID    Given by: Marijean Niemann  Boothe LPN    Exp. Date: 04/22/2011    Lot #: [id number]   Appended Document: OV   Tetanus/Td Immunization History:    Tetanus/Td # 1:  Tdap (08/14/2007)  PPD Results    Date of reading: 06/07/2010    Results: < 5mm    Interpretation: negative    vision corrected right 20/20 left 20/25 both 20/20

## 2010-07-22 NOTE — Assessment & Plan Note (Signed)
Summary: TB  Nurse Visit        Orders Added: 1)  TB Skin Test [86580] 2)  Influenza Vaccine NON MCR [00028]    ]  Influenza Vaccine    Vaccine Type: Fluvax Non-MCR    Site: left deltoid    Mfr: GlaxoSmithKline    Dose: 0.5 ml    Route: IM    Given by: Everitt Amber    Exp. Date: 11/2008    Lot #: [id number]  PPD Application    Vaccine Type: PPD    Site: left forearm    Mfr: Sanofi Pasteur    Dose: 0.1 ml    Route: ID    Given by: Everitt Amber    Exp. Date: 12/20/2009    Lot #: [id number]   Appended Document: TB    Clinical Lists Changes  Observations: Added new observation of TB PPDRESULT: negative (05/01/2008 14:38) Added new observation of PPD RESULT: < 5mm (05/01/2008 14:38) Added new observation of TB-PPD RDDTE: 05/01/2008 (05/01/2008 14:38)       PPD Results    Date of reading: 05/01/2008    Results: < 5mm    Interpretation: negative

## 2010-07-22 NOTE — Letter (Signed)
Summary: lab  lab   Imported By: Curtis Sites 11/23/2009 10:00:09  _____________________________________________________________________  External Attachment:    Type:   Image     Comment:   External Document

## 2010-07-22 NOTE — Letter (Signed)
Summary: xray  xray   Imported By: Curtis Sites 11/23/2009 10:01:13  _____________________________________________________________________  External Attachment:    Type:   Image     Comment:   External Document

## 2010-07-22 NOTE — Letter (Signed)
Summary: demographic  demographic   Imported By: Curtis Sites 11/23/2009 09:59:30  _____________________________________________________________________  External Attachment:    Type:   Image     Comment:   External Document

## 2010-07-22 NOTE — Letter (Signed)
Summary: appt for dexa scan  appt for dexa scan   Imported By: Curtis Sites 06/22/2010 15:29:55  _____________________________________________________________________  External Attachment:    Type:   Image     Comment:   External Document

## 2010-07-22 NOTE — Letter (Signed)
Summary: Letter  Letter   Imported By: Lind Guest 06/30/2010 12:50:55  _____________________________________________________________________  External Attachment:    Type:   Image     Comment:   External Document

## 2010-07-22 NOTE — Assessment & Plan Note (Signed)
Summary: HEMMORROIDS,..EM    History of Present Illness Visit Type: Follow-up Visit Primary GI MD: Sheryn Bison MD FACP FAGA Primary Provider: Syliva Overman MD Chief Complaint: hemmorhoids History of Present Illness:   This patient is a 60 year old African American female who comes my office for evaluation of" hemorrhoids". She denies rectal bleeding but occasionally has some rectal pain associated with constipation. She is on fiber supplements and daily MiraLax and denies abdominal pain. She has chronic reflux for which he takes daily omeprazole 20 mg. I did colonoscopy on her 2 years ago which was unremarkable except for prominent external hemorrhoids.She is insistent on the fact that she's had previous injection therapy for hemorrhoids, but I cannot find this report in her records from [facility] or from [facility].   GI Review of Systems      Denies abdominal pain, acid reflux, belching, bloating, chest pain, dysphagia with liquids, dysphagia with solids, heartburn, loss of appetite, nausea, vomiting, vomiting blood, weight loss, and  weight gain.      Reports rectal pain.     Denies anal fissure, black tarry stools, change in bowel habit, constipation, diarrhea, diverticulosis, fecal incontinence, heme positive stool, hemorrhoids, irritable bowel syndrome, jaundice, light color stool, liver problems, and  rectal bleeding.    Current Medications (verified): 1)  Tranxene-T 7.5 Mg Tabs (Clorazepate Dipotassium) .... One Tab By Mouth Qhs 2)  Flonase 50 Mcg/act Susp (Fluticasone Propionate) .... Uad 3)  Hydrochlorothiazide 25 Mg Tabs (Hydrochlorothiazide) .... One Tab By Mouth Qd 4)  Klor-Con M10 10 Meq Cr-Tabs (Potassium Chloride Crys Cr) .... One Tab By Mouth Qd 5)  Omeprazole 20 Mg Cpdr (Omeprazole) .... One Tab By Mouth Bid  Allergies (verified): No Known Drug Allergies  Past History:  Past Surgical History:    Hysterectomy 1988    Hemorrhoid surgery  myoectomy  Family History:    Reviewed history from 10/02/2008 and no changes required:       Family History of Colon Polyps: father mid 84's       No FH of Colon Cancer:       Family History of Liver Disease/Cirrhosis:grandmother       Family History of Prostate Cancer:father       Family History of Liver Cancer:grandmother       Family History of Pancreatic Cancer:grandfather  Social History:    Reviewed history from 10/02/2008 and no changes required:       Patient has never smoked.        Alcohol Use - no       Occupation: teacher  Review of Systems  The patient denies allergy/sinus, anemia, anxiety-new, arthritis/joint pain, back pain, blood in urine, breast changes/lumps, change in vision, confusion, cough, coughing up blood, depression-new, fainting, fatigue, fever, headaches-new, hearing problems, heart murmur, heart rhythm changes, itching, menstrual pain, muscle pains/cramps, night sweats, nosebleeds, pregnancy symptoms, shortness of breath, skin rash, sleeping problems, sore throat, swelling of feet/legs, swollen lymph glands, thirst - excessive , urination - excessive , urination changes/pain, urine leakage, vision changes, and voice change.    Vital Signs:  Patient profile:   60 year old female Height:      68 inches Weight:      191.13 pounds BMI:     29.17 Pulse rate:   78 / minute Pulse rhythm:   regular BP sitting:   110 / 70  (left arm)  Vitals Entered By: Chales Abrahams CMA (October 03, 2008 10:54 AM)  Physical Exam  General:  Well developed, well nourished, no acute distress.healthy appearing.   Head:  Normocephalic and atraumatic. Eyes:  PERRLA, no icterus. Abdomen:  Soft, nontender and nondistended. No masses, hepatosplenomegaly or hernias noted. Normal bowel sounds. Rectal:  She has prominent external hemorrhoids in all quadrants without  fissures or fistulae noted. Rectal exam showed no masses or tenderness and there was no stool in the rectal vault for  guaiac testing. Psych:  Alert and cooperative. Normal mood and affect.   Impression & Recommendations:  Problem # 1:  EXTERNAL HEMORRHOIDS (ICD-455.3) Assessment Unchanged I have advised her to continue a high-fiber diet with fiber supplements and liberal p.o. fluids. She is to use p.r.n. Sitz bbaths and local Analmantle cream as needed.I have offered her a surgical appointment for possible hemorrhoidectomy but she is not interested in this course of action. I have informed her that I do not think injection therapy is indicated for her external hemorrhoids.  Problem # 2:  CONSTIPATION (ICD-564.00) Assessment: Improved Continue high-fiber diet, fiber supplements, and nightly MiraLax.  Problem # 3:  GERD (ICD-530.81) Assessment: Improved I have reviewed reflux regime with her and suggested that she continue omeprazole before first meal of the day for chronic GERD.  Problem # 4:  HYPERTENSION (ICD-401.9) Assessment: Improved Continue medications per Dr. Syliva Overman.  Patient Instructions: 1)  Copy sent to : Dr. Syliva Overman 2)  High Fiber, Low Fat  Healthy Eating Plan brochure given.  3)  Constipation and Hemorrhoids brochure given.  4)  Local as needed Analmantle ream 5)  Continue daily MiraLax 6)  The medication list was reviewed and reconciled.  All changed / newly prescribed medications were explained.  A complete medication list was provided to the patient / caregiver. Prescriptions: ANAMANTLE HC 3-2.5 %  KIT (LIDOCAINE-HYDROCORTISONE ACE) apply to rectum two times a day  #1 kit x 1   Entered by:   Harlow Mares CMA   Authorized by:   Mardella Layman MD FACG,FAGA   Signed by:   Harlow Mares CMA on 10/03/2008   Method used:   Electronically to        [facility]  [address] [facility] (retail)       [address]       [place], [place]  [address]       Ph: [phone]       Fax: [phone]   RxID:   [id number]

## 2010-07-22 NOTE — Progress Notes (Signed)
Summary: FLONASE  Phone Note Call from Patient   Summary of Call: SAID THE DOCTOR SAID SOMETHING ABOUT GIVING HER A RX FOR FLONASE AND NEVER RECEVED IT  CALL HER BACK TO LET HER KNOW AT [phone] Initial call taken by: Lind Guest,  August 13, 2008 10:43 AM  Follow-up for Phone Call        pls advise and fax in prescription i can't get into EMR Follow-up by: Syliva Overman MD,  August 13, 2008 1:46 PM  Additional Follow-up for Phone Call Additional follow up Details #1::        Patient aware Additional Follow-up by: Everitt Amber,  August 13, 2008 2:16 PM

## 2010-07-22 NOTE — Miscellaneous (Signed)
Summary: refill  Clinical Lists Changes  Medications: Added new medication of OMEPRAZOLE 20 MG CPDR (OMEPRAZOLE) one tab by mouth bid - Signed Rx of OMEPRAZOLE 20 MG CPDR (OMEPRAZOLE) one tab by mouth bid;  #180 x 1;  Signed;  Entered by: Worthy Keeler LPN;  Authorized by: Syliva Overman MD;  Method used: Electronically to [facility] [address], [place], [place], [place]  [address], Ph: [phone], Fax: [phone]    Prescriptions: OMEPRAZOLE 20 MG CPDR (OMEPRAZOLE) one tab by mouth bid  #180 x 1   Entered by:   Worthy Keeler LPN   Authorized by:   Syliva Overman MD   Signed by:   Worthy Keeler LPN on 78/46/9629   Method used:   Electronically to        [facility] ([facility]ail)       [address]       [place], [place]  [address]       Ph: [phone]       Fax: [phone]   RxID:   [id number]

## 2010-07-22 NOTE — Letter (Signed)
Summary: [facility] EXAMINATION CERTIFICATE  [facility] EXAMINATION CERTIFICATE   Imported By: Lind Guest 06/08/2010 09:52:34  _____________________________________________________________________  External Attachment:    Type:   Image     Comment:   External Document

## 2010-07-22 NOTE — Letter (Signed)
Summary: history and physical  history and physical   Imported By: Curtis Sites 11/23/2009 09:59:55  _____________________________________________________________________  External Attachment:    Type:   Image     Comment:   External Document

## 2010-07-22 NOTE — Assessment & Plan Note (Signed)
Summary: BP CK  Nurse Visit   Vital Signs:  Patient profile:   60 year old female Height:      68 inches Weight:      181 pounds BMI:     27.62 BP sitting:   120 / 80  (left arm)  Vitals Entered By: Worthy Keeler LPN (July 23, 2009 3:08 PM)   Allergies: No Known Drug Allergies  Orders Added: 1)  No Charge Patient Arrived (NCPA0) [NCPA0]   Orders Added: 1)  No Charge Patient Arrived (NCPA0) [NCPA0]

## 2010-07-22 NOTE — Assessment & Plan Note (Signed)
Summary: F UP   Vital Signs:  Patient profile:   60 year old female Height:      68 inches Weight:      170 pounds BMI:     25.94 O2 Sat:      98 % on Room air Pulse rate:   86 / minute Pulse rhythm:   regular Resp:     16 per minute BP sitting:   124 / 84  (left arm)  Vitals Entered By: Mauricia Area CMA (April 15, 2010 4:12 PM)  Nutrition Counseling: Patient's BMI is greater than 25 and therefore counseled on weight management options.  O2 Flow:  Room air CC: follow up   Primary Care Provider:  Syliva Overman MD  CC:  follow up.  History of Present Illness: Reports  that tshe has been doing well. 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 Denies recent fever or chills. Denies sinus pressure, nasal congestion , ear pain or sore throat. Denies chest congestion, or cough productive of sputum. Denies chest pain, palpitations, PND, orthopnea or leg swelling. Denies abdominal pain, nausea, vomitting, diarrhea or constipation. Denies change in bowel movements or bloody stool. Denies dysuria , frequency, incontinence or hesitancy. Denies  joint pain, swelling, or reduced mobility. Denies headaches, vertigo, seizures. Denies depression, anxiety or insomnia. Denies  rash, lesions, or itch.     Current Medications (verified): 1)  Hydrochlorothiazide 25 Mg Tabs (Hydrochlorothiazide) .... One Tab By Mouth Once Daily. 2)  Potassium 99 Mg Tabs (Potassium) .... Take 1 Tablet By Mouth Once A Day 3)  Ranitidine Hcl 150 Mg Tabs (Ranitidine Hcl) .... One Tab By Mouth Two Times A Day. 4)  Aspirin 81 Mg Tabs (Aspirin) .... One Tab By Mouth Once Daily. 5)  Tranxene-T 7.5 Mg Tabs (Clorazepate Dipotassium) .... Take 1 Tablet By Mouth Two Times A Day As Needed 6)  Fluoxetine Hcl 40 Mg Caps (Fluoxetine Hcl) .... Take 1 Capsule By Mouth Once A Day 7)  B Complex .Marland Kitchen.. 1 Tab Daily 8)  Vitamin C 500 Mg .Marland Kitchen.. 1 Tab Daily 9)  Omega 3- Fish  Oil .Marland Kitchen.. 1 Tab Daily 10)  Vitamin D3 1000 Iu .Marland Kitchen.. 1 Tab Daily 11)  Magnesium 200 Mg Tabs (Magnesium) .Marland Kitchen.. 1 Tab Daily 12)  Miralax .... Before Bedtime 13)  Psyllium Husk  Powd (Psyllium Husk) .... Taken Daily  Allergies (verified): No Known Drug Allergies  Review of Systems      See HPI General:  Denies chills and fever. Eyes:  Denies discharge, eye pain, and red eye. Endo:  Denies excessive thirst and excessive urination. Heme:  Denies abnormal bruising and bleeding. Allergy:  Complains of seasonal allergies; denies hives or rash and itching eyes.  Physical Exam  General:  Well-developed,well-nourished,in no acute distress; alert,appropriate and cooperative throughout examination HEENT: No facial asymmetry,  EOMI, No sinus tenderness, TM's Clear, oropharynx  pink and moist.   Chest: Clear to auscultation bilaterally.  CVS: S1, S2, No murmurs, No S3.   Abd: Soft, Nontender.  MS: Adequate ROM spine, hips, shoulders and knees.  Ext: No edema.   CNS: CN 2-12 intact, power tone and sensation normal throughout.   Skin: Intact, no visible lesions or rashes.  Psych: Good eye contact, normal affect.  Memory intact, not anxious or depressed appearing.    Impression & Recommendations:  Problem # 1:  INSOMNIA (ICD-780.52) Assessment Improved  Discussed sleep hygiene. Continue med as before  Problem # 2:  DEPRESSION (ICD-311) Assessment: Improved  Her updated medication list for this problem includes:    Tranxene-t 7.5 Mg Tabs (Clorazepate dipotassium) .Marland Kitchen... Take 1 tablet by mouth two times a day as needed    Fluoxetine Hcl 40 Mg Caps (Fluoxetine hcl) .Marland Kitchen... Take 1 capsule by mouth once a day  Problem # 3:  HYPERTENSION (ICD-401.9) Assessment: Improved  Her updated medication list for this problem includes:    Hydrochlorothiazide 25 Mg Tabs (Hydrochlorothiazide) ..... One tab by mouth once daily.  BP today: 124/84 Prior BP: 124/86 (12/08/2009)  Labs Reviewed: K+: 4.3  (12/10/2009) Creat: : 0.83 (12/10/2009)   Chol: 171 (12/10/2009)   HDL: 52 (12/10/2009)   LDL: 107 (12/10/2009)   TG: 58 (12/10/2009)  Problem # 4:  GERD (ICD-530.81) Assessment: Improved  Her updated medication list for this problem includes:    Ranitidine Hcl 150 Mg Tabs (Ranitidine hcl) ..... One tab by mouth two times a day.  Complete Medication List: 1)  Hydrochlorothiazide 25 Mg Tabs (Hydrochlorothiazide) .... One tab by mouth once daily. 2)  Potassium 99 Mg Tabs (Potassium) .... Take 1 tablet by mouth once a day 3)  Ranitidine Hcl 150 Mg Tabs (Ranitidine hcl) .... One tab by mouth two times a day. 4)  Aspirin 81 Mg Tabs (Aspirin) .... One tab by mouth once daily. 5)  Tranxene-t 7.5 Mg Tabs (Clorazepate dipotassium) .... Take 1 tablet by mouth two times a day as needed 6)  Fluoxetine Hcl 40 Mg Caps (Fluoxetine hcl) .... Take 1 capsule by mouth once a day 7)  B Complex  .Marland KitchenMarland Kitchen. 1 tab daily 8)  Vitamin C 500 Mg  .Marland Kitchen.. 1 tab daily 9)  Omega 3- Fish Oil  .Marland Kitchen.. 1 tab daily 10)  Vitamin D3 1000 Iu  .Marland Kitchen.. 1 tab daily 11)  Magnesium 200 Mg Tabs (Magnesium) .Marland Kitchen.. 1 tab daily 12)  Miralax  .... Before bedtime 13)  Psyllium Husk Powd (Psyllium husk) .... Taken daily  Other Orders: Influenza Vaccine NON MCR ([address]109)  Patient Instructions: 1)  Follow up appointment in 5.52months 2)  Congrats on your progress with education, all the best. 3)  Congrats on a 21 pound weight loss in 18 months. 4)  Your bP is excellent, no med changes, pls continue HALF HCTZ once daily. 5)  No changes in any of your meds 6)  BP is 124/82 7)  BMI is 25.94, wght is 170 pounds , and height 68' Prescriptions: FLUOXETINE HCL 40 MG CAPS (FLUOXETINE HCL) Take 1 capsule by mouth once a day  #30 x 3   Entered by:   Adella Hare LPN   Authorized by:   Syliva Overman MD   Signed by:   Adella Hare LPN on 60/45/4098   Method used:   Electronically to        [facility]  [address] (retail)       [address]        [place], [place]  [address]       Ph: [phone]       Fax: [phone]   RxID:   [id number] RANITIDINE HCL 150 MG TABS (RANITIDINE HCL) one tab by mouth two times a day.  #60 x 3   Entered by:   Adella Hare LPN   Authorized by:   Syliva Overman MD   Signed by:   Adella Hare LPN on 53/66/4403   Method used:   Electronically to  [facility]  [address] (retail)       [address]       [place], [place]  [address]       Ph: [phone]       Fax: [phone]   RxID:   [id number] HYDROCHLOROTHIAZIDE 25 MG TABS (HYDROCHLOROTHIAZIDE) one tab by mouth once daily.  #30 x 3   Entered by:   Adella Hare LPN   Authorized by:   Syliva Overman MD   Signed by:   Adella Hare LPN on 40/03/2724   Method used:   Electronically to        [facility]  [address] (retail)       [address]       [place], [place]  [address]       Ph: [phone]       Fax: [phone]   RxID:   [id number]    Orders Added: 1)  Est. Patient Level IV [09323] 2)  Influenza Vaccine NON MCR [00028]   Immunizations Administered:  Influenza Vaccine # 1:    Vaccine Type: Fluvax Non-MCR    Site: right deltoid    Mfr: novartis    Dose: 0.5 ml    Route: IM    Given by: Adella Hare LPN    Exp. Date: 10/2010    Lot #: [id number]    VIS given: 01/12/10 version given April 15, 2010.   Immunizations Administered:  Influenza Vaccine # 1:    Vaccine Type: Fluvax Non-MCR    Site: right deltoid    Mfr: novartis    Dose: 0.5 ml    Route: IM    Given by: Adella Hare LPN    Exp. Date: 10/2010    Lot #: [id number]    VIS given: 01/12/10 version given April 15, 2010.

## 2010-07-22 NOTE — Letter (Signed)
Summary: Letter  Letter   Imported By: Lind Guest 12/17/2009 15:55:14  _____________________________________________________________________  External Attachment:    Type:   Image     Comment:   External Document

## 2010-07-22 NOTE — Miscellaneous (Signed)
Summary: refill  Clinical Lists Changes  Medications: Rx of OMEPRAZOLE 20 MG CPDR (OMEPRAZOLE) one tab by mouth bid;  #180 x 1;  Signed;  Entered by: Worthy Keeler LPN;  Authorized by: Syliva Overman MD;  Method used: Electronically to [facility] [address], [place], [place], [place]  [address], Ph: [phone], Fax: [phone]    Prescriptions: OMEPRAZOLE 20 MG CPDR (OMEPRAZOLE) one tab by mouth bid  #180 x 1   Entered by:   Worthy Keeler LPN   Authorized by:   Syliva Overman MD   Signed by:   Worthy Keeler LPN on 47/42/5956   Method used:   Electronically to        [facility]  [address] (retail)       [address]       [place], [place]  [address]       Ph: [phone]       Fax: [phone]   RxID:   [id number]

## 2010-07-22 NOTE — Progress Notes (Signed)
Summary: RESULTS  Phone Note Call from Patient   Summary of Call: WANTS SOMEONE TO CALL HER ABOUT HER LAB RESULTS Initial call taken by: Lind Guest,  September 03, 2008 10:29 AM  Follow-up for Phone Call        Patient aware Follow-up by: Everitt Amber,  September 03, 2008 10:42 AM

## 2010-08-09 ENCOUNTER — Emergency Department (HOSPITAL_COMMUNITY)
Admission: EM | Admit: 2010-08-09 | Discharge: 2010-08-09 | Disposition: A | Discharge status: Home / Self Care | Payer: Self-pay | Attending: Emergency Medicine | Admitting: Emergency Medicine

## 2010-08-09 ENCOUNTER — Emergency Department (HOSPITAL_COMMUNITY): Payer: Self-pay

## 2010-08-09 DIAGNOSIS — Z79899 Other long term (current) drug therapy: Secondary | ICD-10-CM | POA: Insufficient documentation

## 2010-08-09 DIAGNOSIS — I10 Essential (primary) hypertension: Secondary | ICD-10-CM | POA: Insufficient documentation

## 2010-08-09 DIAGNOSIS — R51 Headache: Secondary | ICD-10-CM | POA: Insufficient documentation

## 2010-08-10 ENCOUNTER — Telehealth (INDEPENDENT_AMBULATORY_CARE_PROVIDER_SITE_OTHER): Payer: Self-pay | Admitting: *Deleted

## 2010-08-11 ENCOUNTER — Ambulatory Visit: Payer: Self-pay | Admitting: Family Medicine

## 2010-08-13 ENCOUNTER — Encounter: Payer: Self-pay | Admitting: Family Medicine

## 2010-08-13 ENCOUNTER — Ambulatory Visit (INDEPENDENT_AMBULATORY_CARE_PROVIDER_SITE_OTHER): Payer: Self-pay | Admitting: Family Medicine

## 2010-08-13 DIAGNOSIS — K219 Gastro-esophageal reflux disease without esophagitis: Secondary | ICD-10-CM

## 2010-08-13 DIAGNOSIS — I10 Essential (primary) hypertension: Secondary | ICD-10-CM

## 2010-08-13 LAB — CONVERTED CEMR LAB
HIV: NONREACTIVE
Hepatitis B-Post: 7.1 milliintl units/mL

## 2010-08-17 ENCOUNTER — Telehealth: Payer: Self-pay | Admitting: Family Medicine

## 2010-08-17 NOTE — Progress Notes (Signed)
Summary: headache  Phone Note Call from Patient   Summary of Call: pt has a headache and went to er yesterday. they did a ct and gave her medicines. but pt head still hurting [phone] Initial call taken by: Rudene Anda,  August 10, 2010 4:45 PM  Follow-up for Phone Call        patient has called agin and still would like to have an appoinment before friday Follow-up by: Lind Guest,  August 11, 2010 9:09 AM  Additional Follow-up for Phone Call Additional follow up Details #1::         I know there are no openings but try to work her in Friday if possible, thanks Additional Follow-up by: Everitt Amber LPN,  August 11, 2010 9:19 AM    Additional Follow-up for Phone Call Additional follow up Details #2::    patient aware to come in friday at 10:30 Follow-up by: Lind Guest,  August 11, 2010 4:38 PM

## 2010-08-26 NOTE — Assessment & Plan Note (Signed)
Summary: office visit   Vital Signs:  Patient profile:   60 year old female Height:      68 inches Weight:      169.25 pounds BMI:     25.83 O2 Sat:      96 % on Room air Pulse rate:   71 / minute Pulse rhythm:   regular Resp:     16 per minute BP sitting:   150 / 94  (left arm)  Vitals Entered By: Adella Hare LPN (August 13, 2010 10:55 AM)  Nutrition Counseling: Patient's BMI is greater than 25 and therefore counseled on weight management options.  O2 Flow:  Room air CC: follow-up visit Is Patient Diabetic? No   Primary Care Provider:  Syliva Overman MD  CC:  follow-up visit.  History of Present Illness: Pt has ahd headach since last Thursday, pain on the left side of the head, has been using asprin regularly, seen in the ED, was prescribed vicodin has not filled , stopped fluoxetine , wants tranxene Reports  that prior to this she had been doing fairly well. She continues to feel . Denies recent fever or chills. Denies sinus pressure, nasal congestion , ear pain or sore throat. Denies chest congestion, or cough productive of sputum. Denies chest pain, palpitations, PND, orthopnea or leg swelling. Denies abdominal pain, nausea, vomitting, diarrhea or constipation. Denies change in bowel movements or bloody stool. Denies dysuria , frequency, incontinence or hesitancy.  Denies depression, anxiety or insomnia. Denies  rash, lesions, or itch.     Current Medications (verified): 1)  Hydrochlorothiazide 25 Mg Tabs (Hydrochlorothiazide) .... One Tab By Mouth Once Daily. 2)  Potassium 99 Mg Tabs (Potassium) .... Take 1 Tablet By Mouth Once A Day 3)  Ranitidine Hcl 150 Mg Tabs (Ranitidine Hcl) .... One Tab By Mouth Two Times A Day. 4)  Tranxene-T 7.5 Mg Tabs (Clorazepate Dipotassium) .... Take 1 Tablet By Mouth Two Times A Day As Needed 5)  Vitamin D3 1000 Iu .Marland Kitchen.. 1 Tab Daily 6)  Magnesium 200 Mg Tabs (Magnesium) .Marland Kitchen.. 1 Tab Daily 7)  Miralax .... Before Bedtime 8)   Clotrimazole 1 % Crea (Clotrimazole) .... Apply Twice Daily To Soles of Feet For 4 Weeks, Then As Needed  Allergies (verified): No Known Drug Allergies  Review of Systems      See HPI General:  Complains of fatigue and sleep disorder. Eyes:  Complains of vision loss-both eyes; denies discharge and eye pain. Neuro:  Complains of headaches; denies memory loss, numbness, poor balance, seizures, sensation of room spinning, and tingling; left sided headache with sharp pain started last week Thursday. Psych:  Complains of anxiety and mental problems; denies depression, suicidal thoughts/plans, thoughts of violence, and unusual visions or sounds. Endo:  Denies cold intolerance, excessive hunger, excessive thirst, and excessive urination. Heme:  Denies abnormal bruising and bleeding. Allergy:  Complains of seasonal allergies.  Physical Exam  General:  Well-developed,well-nourished,in no acute distress; alert,appropriate and cooperative throughout examination HEENT: No facial asymmetry,  EOMI, No sinus tenderness, TM's Clear, oropharynx  pink and moist.   Chest: Clear to auscultation bilaterally.  CVS: S1, S2, No murmurs, No S3.   Abd: Soft, Nontender.  MS: Adequate ROM spine, hips, shoulders and knees.  Ext: No edema.   CNS: CN 2-12 intact, power tone and sensation normal throughout.   Skin: Intact, no visible lesions or rashes.  Psych: Good eye contact, normal affect.  Memory intact, mildly anxious not  depressed appearing.  Impression & Recommendations:  Problem # 1:  HYPERTENSION (ICD-401.9) Assessment Deteriorated  Her updated medication list for this problem includes:    Hydrochlorothiazide 25 Mg Tabs (Hydrochlorothiazide) ..... One tab by mouth once daily.  BP today: 150/94 Prior BP: 120/80 (06/04/2010)  Labs Reviewed: K+: 4.3 (12/10/2009) Creat: : 0.83 (12/10/2009)   Chol: 171 (12/10/2009)   HDL: 52 (12/10/2009)   LDL: 107 (12/10/2009)   TG: 58 (12/10/2009)  Problem #  2:  DEPRESSION (ICD-311)  The following medications were removed from the medication list:    Fluoxetine Hcl 40 Mg Caps (Fluoxetine hcl) .Marland Kitchen... Take 1 capsule by mouth once a day Her updated medication list for this problem includes:    Tranxene-t 7.5 Mg Tabs (Clorazepate dipotassium) .Marland Kitchen... Take 1 tablet by mouth two times a day as needed  Discussed treatment options, including trial of antidpressant medication. Will refer to behavioral health. Follow-up call in in 24-48 hours and recheck in 2 weeks, sooner as needed. Patient agrees to call if any worsening of symptoms or thoughts of doing harm arise. Verified that the patient has no suicidal ideation at this time.   Problem # 3:  GERD (ICD-530.81) Assessment: Unchanged  Her updated medication list for this problem includes:    Ranitidine Hcl 150 Mg Tabs (Ranitidine hcl) ..... One tab by mouth two times a day.  Labs Reviewed: Hgb: 12.8 (12/10/2009)   Hct: 37.5 (12/10/2009)  Complete Medication List: 1)  Hydrochlorothiazide 25 Mg Tabs (Hydrochlorothiazide) .... One tab by mouth once daily. 2)  Potassium 99 Mg Tabs (Potassium) .... Take 1 tablet by mouth once a day 3)  Ranitidine Hcl 150 Mg Tabs (Ranitidine hcl) .... One tab by mouth two times a day. 4)  Tranxene-t 7.5 Mg Tabs (Clorazepate dipotassium) .... Take 1 tablet by mouth two times a day as needed 5)  Vitamin D3 1000 Iu  .Marland Kitchen.. 1 tab daily 6)  Magnesium 200 Mg Tabs (Magnesium) .Marland Kitchen.. 1 tab daily 7)  Miralax  .... Before bedtime 8)  Clotrimazole 1 % Crea (Clotrimazole) .... Apply twice daily to soles of feet for 4 weeks, then as needed  Other Orders: T-HIV Antibody  (Reflex) ([id number]) T- * Misc. Laboratory test [phone])  Patient Instructions: 1)  Please schedule a follow-up appointment in 2.months. 2)  Resume tranxene as before, we will refill. 3)  Pls call if you still feel overwhelmed  and stressed 3 weeks into tranxene, so I can know if I need to refill the fluoxetine, even  at a lower dose for starters. 4)  Increase the HCTZ to ONE tabl;et daily and the potassium to TWO tablets daily pls. your BP is 150/94   Orders Added: 1)  Est. Patient Level IV [95621] 2)  T-HIV Antibody  (Reflex) [[id number]] 3)  T- * Misc. Laboratory test [phone]

## 2010-08-26 NOTE — Progress Notes (Signed)
Summary: wnats a anti.  Phone Note From Pharmacy   Summary of Call: could you rx her an anti. for ear and head thinkning it is a viral infection. [facility] in [place]. call back at cell # [phone] to let her know Initial call taken by: Lind Guest,  August 17, 2010 1:52 PM  Follow-up for Phone Call        headache, sinus pressure, no fever, stuffy head  patient wants antibiotic  advised no antibiotics without ov but patient wants to know what you suggest  since no feve , etc then netty pot, 2 to 3 times daily, decongestant eg sudafed one daily and benadryl 1 at night if she has no prescription allergy meds regulalrly  Additional Follow-up for Phone Call Additional follow up Details #1::        returned call, left message Additional Follow-up by: Adella Hare LPN,  August 17, 2010 2:16 PM

## 2010-09-23 ENCOUNTER — Encounter: Payer: Self-pay | Admitting: Family Medicine

## 2010-09-23 ENCOUNTER — Encounter (HOSPITAL_COMMUNITY): Payer: Self-pay | Admitting: Family Medicine

## 2010-09-27 ENCOUNTER — Encounter: Payer: Self-pay | Admitting: Family Medicine

## 2010-09-28 ENCOUNTER — Ambulatory Visit: Payer: Self-pay | Admitting: Family Medicine

## 2010-10-05 ENCOUNTER — Encounter: Payer: Self-pay | Admitting: Family Medicine

## 2010-10-06 ENCOUNTER — Ambulatory Visit: Payer: Self-pay | Admitting: Family Medicine

## 2010-10-28 ENCOUNTER — Telehealth: Payer: Self-pay | Admitting: Family Medicine

## 2010-11-05 NOTE — Op Note (Signed)
NAME:  Tiffany Mendoza, Tiffany Mendoza NO.:  [id number]   MEDICAL RECORD NO.:  [id number]                   PATIENT TYPE:  AMB   LOCATION:  ENDO                                 FACILITY:  [facility]   PHYSICIAN:  Danise Edge, M.D.                DATE OF BIRTH:  1950/10/22   DATE OF PROCEDURE:  06/06/2003  DATE OF DISCHARGE:                                 OPERATIVE REPORT   PROCEDURE:  Esophagogastroduodenoscopy.   INDICATIONS FOR PROCEDURE:  Tiffany Mendoza is a 60 year old female born  06/10/1951.  Tiffany Mendoza is experiencing post prandial upper abdominal  discomfort.  Her abdominal ultrasound did not show gallstones.  She is on a  proton pump inhibitor and her symptoms are improving.   ENDOSCOPIST:  Danise Edge, M.D.   PREMEDICATION:  Versed 10 mg, Demerol 50 mg.   DESCRIPTION OF PROCEDURE:  After obtaining informed consent, Tiffany Mendoza was  placed in the left lateral decubitus position. I administered intravenous  Demerol and intravenous Versed to achieve conscious sedation for the  procedure. The patient's blood pressure, oxygen saturation and cardiac  rhythm were monitored throughout the procedure and documented in the medical  record.   The Olympus gastroscope was passed through the posterior hypopharynx into  the proximal esophagus without difficulty. The hypopharynx, larynx and vocal  cords appeared normal.   ESOPHAGOSCOPY:  The proximal, mid and lower segments of the esophageal  mucosa appear normal. The squamocolumnar junction and esophagogastric  junction are noted at 40 cm from the incisor teeth.   GASTROSCOPY:  Retroflexed view of the gastric cardia and fundus was normal.  The gastric body, antrum, and pylorus appear normal.   DUODENOSCOPY:  The duodenal bulb, mid duodenum and distal duodenum appear  normal.   ASSESSMENT:  Normal esophagogastroduodenoscopy.  No gallstones by  ultrasound.      Danise Edge, M.D.    MJ/MEDQ  D:  06/06/2003  T:  06/07/2003  Job:  [id number]   cc:   Theressa Millard, M.D.  [address] [place]  [place] [address]  Fax: [phone]

## 2010-11-11 ENCOUNTER — Encounter: Payer: Self-pay | Admitting: Family Medicine

## 2010-11-16 ENCOUNTER — Ambulatory Visit (INDEPENDENT_AMBULATORY_CARE_PROVIDER_SITE_OTHER): Payer: Self-pay | Admitting: Family Medicine

## 2010-11-16 ENCOUNTER — Encounter: Payer: Self-pay | Admitting: Family Medicine

## 2010-11-16 ENCOUNTER — Other Ambulatory Visit: Payer: Self-pay | Admitting: Family Medicine

## 2010-11-16 DIAGNOSIS — I10 Essential (primary) hypertension: Secondary | ICD-10-CM

## 2010-11-16 DIAGNOSIS — Z13220 Encounter for screening for lipoid disorders: Secondary | ICD-10-CM

## 2010-11-16 DIAGNOSIS — G4700 Insomnia, unspecified: Secondary | ICD-10-CM

## 2010-11-16 DIAGNOSIS — F329 Major depressive disorder, single episode, unspecified: Secondary | ICD-10-CM

## 2010-11-16 DIAGNOSIS — K219 Gastro-esophageal reflux disease without esophagitis: Secondary | ICD-10-CM

## 2010-11-16 DIAGNOSIS — R5381 Other malaise: Secondary | ICD-10-CM

## 2010-11-16 DIAGNOSIS — Z139 Encounter for screening, unspecified: Secondary | ICD-10-CM

## 2010-11-16 DIAGNOSIS — M810 Age-related osteoporosis without current pathological fracture: Secondary | ICD-10-CM

## 2010-11-16 DIAGNOSIS — R5383 Other fatigue: Secondary | ICD-10-CM

## 2010-11-16 DIAGNOSIS — B353 Tinea pedis: Secondary | ICD-10-CM

## 2010-11-16 NOTE — Progress Notes (Signed)
  Subjective:    Patient ID: Tiffany Mendoza, female    DOB: [birth year], 60 y.o.   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 She has std receiving her social security which is a help, recently had a hearing in [place] regarding property which she had almost lost to foreclosure, this was approved in April, so overall her finances are better, and so reportedly is her mental health. She is planning to start an exercise program and make serious attempts to lose some weight. She even reports that she is hoping to move to [employer]. C/O fungal foot infection after visit and requests med sent in for this   Review of Systems Denies recent fever or chills. Denies sinus pressure, nasal congestion, ear pain or sore throat. Denies chest congestion, productive cough or wheezing. Denies chest pains, palpitations, and leg swelling Denies abdominal pain, nausea, vomiting,diarrhea or constipation.  . Denies dysuria, frequency, hesitancy or incontinence. Denies joint pain, swelling and limitation in mobility. Denies headaches, seizure, numbness, or tingling. Denies uncontrolled  depression, anxiety or insomnia.       Objective:   Physical Exam    Patient alert and oriented and in no Cardiopulmonary distress.  HEENT: No facial asymmetry, EOMI, no sinus tenderness,   Neck supple no adenopathy.  Chest: Clear to auscultation bilaterally.  CVS: S1, S2 no murmurs, no S3.  ABD: Soft non tender. Bowel sounds normal.  Ext: No edema  MS: Adequate ROM spine, shoulders, hips and knees.  Skin: Intact, tinea pedis  Psych: Good eye contact, normal affect. Memory intact not anxious or depressed appearing.  CNS: CN 2-12 intact, power, tone and  sensation normal throughout.     Assessment & Plan:

## 2010-11-16 NOTE — Patient Instructions (Addendum)
F/U in 4 months.  You need your mammogram and we will send for your last colonscopy report.  Blood pressure is good , no med changes.  Pls start regular exercise , and stop the moon pies, eat mainly fruit and vegetable and drink water and not sweetened drinks.  Fasting labs are due June 23 or after , you will get the order today

## 2010-11-17 ENCOUNTER — Telehealth: Payer: Self-pay | Admitting: Family Medicine

## 2010-11-17 MED ORDER — KETOCONAZOLE 2 % EX CREA: TOPICAL_CREAM | Freq: Every day | CUTANEOUS | Status: DC

## 2010-11-17 NOTE — Assessment & Plan Note (Signed)
Unchanged, wants alternative med

## 2010-11-17 NOTE — Assessment & Plan Note (Signed)
Controlled on current med, no change 

## 2010-11-17 NOTE — Telephone Encounter (Signed)
Called patient, not available 

## 2010-11-17 NOTE — Assessment & Plan Note (Signed)
Controlled, no change in medication  

## 2010-11-17 NOTE — Assessment & Plan Note (Signed)
Improved, no med change at this time

## 2010-11-17 NOTE — Telephone Encounter (Signed)
Let her know new med sent in pls

## 2010-11-17 NOTE — Assessment & Plan Note (Signed)
improved

## 2010-11-25 ENCOUNTER — Ambulatory Visit (HOSPITAL_COMMUNITY): Payer: Self-pay

## 2010-12-02 ENCOUNTER — Ambulatory Visit (HOSPITAL_COMMUNITY)
Admission: RE | Admit: 2010-12-02 | Discharge: 2010-12-02 | Disposition: A | Discharge status: Home / Self Care | Payer: Self-pay | Source: Ambulatory Visit | Attending: Family Medicine | Admitting: Family Medicine

## 2010-12-02 DIAGNOSIS — Z139 Encounter for screening, unspecified: Secondary | ICD-10-CM

## 2010-12-02 DIAGNOSIS — Z1231 Encounter for screening mammogram for malignant neoplasm of breast: Secondary | ICD-10-CM | POA: Insufficient documentation

## 2010-12-07 NOTE — Telephone Encounter (Signed)
Was sent to [facility] and nothing was sent back it is 6.19.12

## 2010-12-14 LAB — CBC WITH DIFFERENTIAL/PLATELET
Basophils Absolute: 0 10*3/uL (ref 0.0–0.1)
Basophils Relative: 0 % (ref 0–1)
Eosinophils Absolute: 0.1 10*3/uL (ref 0.0–0.7)
Eosinophils Relative: 2 % (ref 0–5)
HCT: 40.8 % (ref 36.0–46.0)
Hemoglobin: 13.2 g/dL (ref 12.0–15.0)
Lymphocytes Relative: 46 % (ref 12–46)
Lymphs Abs: 2.4 10*3/uL (ref 0.7–4.0)
MCH: 31.1 pg (ref 26.0–34.0)
MCHC: 32.4 g/dL (ref 30.0–36.0)
MCV: 96.2 fL (ref 78.0–100.0)
Monocytes Absolute: 0.3 10*3/uL (ref 0.1–1.0)
Monocytes Relative: 6 % (ref 3–12)
Neutro Abs: 2.4 10*3/uL (ref 1.7–7.7)
Neutrophils Relative %: 47 % (ref 43–77)
Platelets: 226 10*3/uL (ref 150–400)
RBC: 4.24 MIL/uL (ref 3.87–5.11)
RDW: 12.9 % (ref 11.5–15.5)
WBC: 5.1 10*3/uL (ref 4.0–10.5)

## 2010-12-14 LAB — BASIC METABOLIC PANEL
BUN: 11 mg/dL (ref 6–23)
CO2: 27 mEq/L (ref 19–32)
Calcium: 9.7 mg/dL (ref 8.4–10.5)
Chloride: 105 mEq/L (ref 96–112)
Creat: 0.81 mg/dL (ref 0.50–1.10)
Glucose, Bld: 92 mg/dL (ref 70–99)
Potassium: 4.3 mEq/L (ref 3.5–5.3)
Sodium: 142 mEq/L (ref 135–145)

## 2010-12-14 LAB — LIPID PANEL
Cholesterol: 170 mg/dL (ref 0–200)
HDL: 58 mg/dL (ref >39)
LDL Cholesterol: 97 mg/dL (ref 0–99)
Total CHOL/HDL Ratio: 2.9 Ratio
Triglycerides: 76 mg/dL (ref <150)
VLDL: 15 mg/dL (ref 0–40)

## 2010-12-14 LAB — TSH: TSH: 3.22 u[IU]/mL (ref 0.350–4.500)

## 2010-12-16 LAB — VITAMIN D 1,25 DIHYDROXY
Vitamin D 1, 25 (OH)2 Total: 29 pg/mL (ref 18–72)
Vitamin D2 1, 25 (OH)2: <8 pg/mL
Vitamin D3 1, 25 (OH)2: 29 pg/mL

## 2011-01-09 ENCOUNTER — Other Ambulatory Visit: Payer: Self-pay | Admitting: Family Medicine

## 2011-01-24 ENCOUNTER — Telehealth: Payer: Self-pay | Admitting: Family Medicine

## 2011-01-28 NOTE — Telephone Encounter (Signed)
Patient wanted to know when last flu shot was. She is aware

## 2011-03-09 ENCOUNTER — Other Ambulatory Visit: Payer: Self-pay | Admitting: Family Medicine

## 2011-03-17 ENCOUNTER — Ambulatory Visit: Payer: Self-pay | Admitting: Family Medicine

## 2011-03-29 ENCOUNTER — Telehealth: Payer: Self-pay | Admitting: Family Medicine

## 2011-03-30 ENCOUNTER — Other Ambulatory Visit: Payer: Self-pay | Admitting: Family Medicine

## 2011-03-30 NOTE — Telephone Encounter (Signed)
Pls refer to dermatology for foot rash per her request

## 2011-03-30 NOTE — Telephone Encounter (Signed)
Called and left message for pt to return my call

## 2011-03-30 NOTE — Telephone Encounter (Signed)
pls advise pt OTC antfungal preps are very good if used regularly cream as well as the powder. If she has no insurance we do not need to refer her , she can just call for her own appt, let me know if further problems with this

## 2011-04-05 NOTE — Telephone Encounter (Signed)
There are no dermotology that takes discount

## 2011-04-15 ENCOUNTER — Encounter: Payer: Self-pay | Admitting: Family Medicine

## 2011-04-18 ENCOUNTER — Encounter: Payer: Self-pay | Admitting: Family Medicine

## 2011-04-18 ENCOUNTER — Ambulatory Visit (INDEPENDENT_AMBULATORY_CARE_PROVIDER_SITE_OTHER): Payer: Self-pay | Admitting: Family Medicine

## 2011-04-18 VITALS — BP 120/84 | HR 85 | Resp 16 | Ht 68.0 in | Wt 179.0 lb

## 2011-04-18 DIAGNOSIS — F329 Major depressive disorder, single episode, unspecified: Secondary | ICD-10-CM

## 2011-04-18 DIAGNOSIS — B353 Tinea pedis: Secondary | ICD-10-CM

## 2011-04-18 DIAGNOSIS — I10 Essential (primary) hypertension: Secondary | ICD-10-CM

## 2011-04-18 DIAGNOSIS — G4700 Insomnia, unspecified: Secondary | ICD-10-CM

## 2011-04-18 DIAGNOSIS — Z23 Encounter for immunization: Secondary | ICD-10-CM

## 2011-04-18 MED ORDER — KETOCONAZOLE 2 % EX CREA: TOPICAL_CREAM | Freq: Every day | CUTANEOUS | Status: AC

## 2011-04-18 MED ORDER — HYDROCHLOROTHIAZIDE 25 MG PO TABS: ORAL_TABLET | ORAL | Status: DC

## 2011-04-18 MED ORDER — CLORAZEPATE DIPOTASSIUM 7.5 MG PO TABS: ORAL_TABLET | ORAL | Status: DC

## 2011-04-18 NOTE — Patient Instructions (Addendum)
F/u in 6 months  It is important that you exercise regularly at least 30 minutes 5 times a week. If you develop chest pain, have severe difficulty breathing, or feel very tired, stop exercising immediately and seek medical attention    A healthy diet is rich in fruit, vegetables and whole grains. Poultry fish, nuts and beans are a healthy choice for protein rather then red meat. A low sodium diet and drinking 64 ounces of water daily is generally recommended. Oils and sweet should be limited. Carbohydrates especially for those who are diabetic or overweight, should be limited to 30-45 gram per meal. It is important to eat on a regular schedule, at least 3 times daily. Snacks should be primarily fruits, vegetables or nuts.  Your blood pressure is excellent no med change  Labs this Summer were excellent

## 2011-04-19 NOTE — Assessment & Plan Note (Signed)
Continues to rely on medication, primarily due  To stress of relationship with her mother and lack of work

## 2011-04-19 NOTE — Assessment & Plan Note (Signed)
Controlled, no change in medication  

## 2011-04-19 NOTE — Assessment & Plan Note (Signed)
Improved, she is to continue to use topical antifungal cream

## 2011-04-19 NOTE — Assessment & Plan Note (Signed)
Improved, not relying on /needing zoloft at this time

## 2011-04-19 NOTE — Progress Notes (Signed)
  Subjective:    Patient ID: Tiffany Mendoza, female    DOB: 04/03/1951, 60 y.o.   MRN: [id number]  HPI The PT is here for follow up and re-evaluation of chronic medical conditions, medication management and review of any available recent lab and radiology data.  Preventive health is updated, specifically  Cancer screening and Immunization.   Questions or concerns regarding consultations or procedures which the PT has had in the interim are  addressed. The PT denies any adverse reactions to current medications since the last visit.  There are no new concerns.  C/o continued stress living with her aging mom, but states that in many ways this is improving    Review of Systems See HPI Denies recent fever or chills. Denies sinus pressure, nasal congestion, ear pain or sore throat. Denies chest congestion, productive cough or wheezing. Denies chest pains, palpitations and leg swelling Denies abdominal pain, nausea, vomiting,diarrhea or constipation.   Denies dysuria, frequency, hesitancy or incontinence. Denies joint pain, swelling and limitation in mobility. Denies headaches, seizures, numbness, or tingling. Denies depression, anxiety or insomnia. States fungal rash on foot markedly improved, but concerned that still present to slight degree      Objective:   Physical Exam Patient alert and oriented and in no cardiopulmonary distress.  HEENT: No facial asymmetry, EOMI, no sinus tenderness,  oropharynx pink and moist.  Neck supple no adenopathy.  Chest: Clear to auscultation bilaterally.  CVS: S1, S2 no murmurs, no S3.  ABD: Soft non tender. Bowel sounds normal.  Ext: No edema  MS: Adequate ROM spine, shoulders, hips and knees.  Skin: Intact, no ulcerations noted.Mild tinea pedis  Psych: Good eye contact, normal affect. Memory intact not anxious or depressed appearing.  CNS: CN 2-12 intact, power, tone and sensation normal throughout.        Assessment & Plan:

## 2011-10-17 ENCOUNTER — Ambulatory Visit: Payer: Self-pay | Admitting: Family Medicine

## 2011-11-25 ENCOUNTER — Other Ambulatory Visit: Payer: Self-pay | Admitting: Family Medicine

## 2011-12-12 ENCOUNTER — Telehealth: Payer: Self-pay | Admitting: Family Medicine

## 2011-12-12 NOTE — Telephone Encounter (Signed)
pls document her concerns I just spoke with her last week, pls explain this is the way we handle phone messages

## 2011-12-13 NOTE — Telephone Encounter (Signed)
Calling regarding a bill her mother received in the mail regarding office visit 10/07/2010

## 2011-12-14 NOTE — Telephone Encounter (Signed)
Returned call to Tiffany Mendoza regarding Tiffany Mendoza charges for 10/07/10 visit.  Advised I would request [facility] send to Coding for review as I do not see any problems with the initial billing for a Level IV visit which is supported by Dr. Anthony Sar progress notes.  JSH

## 2012-01-02 ENCOUNTER — Other Ambulatory Visit: Payer: Self-pay | Admitting: Family Medicine

## 2012-01-11 ENCOUNTER — Ambulatory Visit: Payer: Self-pay | Admitting: Family Medicine

## 2012-01-24 ENCOUNTER — Telehealth: Payer: Self-pay

## 2012-01-24 DIAGNOSIS — I10 Essential (primary) hypertension: Secondary | ICD-10-CM

## 2012-01-24 DIAGNOSIS — Z13220 Encounter for screening for lipoid disorders: Secondary | ICD-10-CM

## 2012-01-24 NOTE — Telephone Encounter (Signed)
Advise cbc, fasting chem 7 and lipids before next vist and order please. Sorry she was "upset" let her know

## 2012-01-24 NOTE — Telephone Encounter (Signed)
Patient said she was upset because she was called to be reminded of her next day appt and a message was left with her mother she needed to bring $100 to visit. She called back and cancelled and rescheduled for Oct. She said she likes to be preventative with her healthcare and she hasn't had labs done in over a year and really needs to come in also. Wants to know if you think she needs to come sooner and if she needs labwork and when? (last labs were excellent fyi)

## 2012-01-30 NOTE — Telephone Encounter (Signed)
01/30/12 - spoke with Janye (daughter) regarding mother's open accounts.  As of this date, only accounts open are awaiting payment from insurance company.  No outstanding balances and nothing shown as being sent to [employer].  Advised Roselinda if she receives another call from [employer], immediately call [employer] and have them research - I will also research from the physician practice side as well.

## 2012-01-30 NOTE — Addendum Note (Signed)
Addended by: Kandis Fantasia B on: 01/30/2012 12:02 PM   Modules accepted: Orders

## 2012-01-30 NOTE — Telephone Encounter (Signed)
Pt aware and labs ordered 

## 2012-02-23 ENCOUNTER — Ambulatory Visit (INDEPENDENT_AMBULATORY_CARE_PROVIDER_SITE_OTHER): Payer: Self-pay | Admitting: Otolaryngology

## 2012-02-23 DIAGNOSIS — K219 Gastro-esophageal reflux disease without esophagitis: Secondary | ICD-10-CM

## 2012-02-23 DIAGNOSIS — R070 Pain in throat: Secondary | ICD-10-CM

## 2012-04-18 ENCOUNTER — Ambulatory Visit: Payer: Self-pay | Admitting: Family Medicine

## 2012-04-22 ENCOUNTER — Other Ambulatory Visit: Payer: Self-pay | Admitting: Family Medicine

## 2012-04-24 ENCOUNTER — Telehealth: Payer: Self-pay | Admitting: Family Medicine

## 2012-04-24 NOTE — Telephone Encounter (Signed)
Tele message is on [place] and will be in her chart

## 2012-05-28 ENCOUNTER — Other Ambulatory Visit: Payer: Self-pay | Admitting: Family Medicine

## 2012-06-26 ENCOUNTER — Other Ambulatory Visit: Payer: Self-pay | Admitting: Family Medicine

## 2012-06-28 ENCOUNTER — Telehealth: Payer: Self-pay

## 2012-07-04 NOTE — Telephone Encounter (Signed)
Opened in error

## 2012-07-31 ENCOUNTER — Ambulatory Visit: Payer: Self-pay | Admitting: Family Medicine

## 2012-08-08 ENCOUNTER — Ambulatory Visit (INDEPENDENT_AMBULATORY_CARE_PROVIDER_SITE_OTHER): Payer: Self-pay | Admitting: Family Medicine

## 2012-08-08 ENCOUNTER — Encounter: Payer: Self-pay | Admitting: Family Medicine

## 2012-08-08 VITALS — BP 140/90 | HR 76 | Resp 18 | Ht 68.0 in | Wt 188.0 lb

## 2012-08-08 DIAGNOSIS — Z130 Encounter for screening for diseases of the blood and blood-forming organs and certain disorders involving the immune mechanism: Secondary | ICD-10-CM

## 2012-08-08 DIAGNOSIS — Z23 Encounter for immunization: Secondary | ICD-10-CM

## 2012-08-08 DIAGNOSIS — Z1321 Encounter for screening for nutritional disorder: Secondary | ICD-10-CM

## 2012-08-08 DIAGNOSIS — R5383 Other fatigue: Secondary | ICD-10-CM

## 2012-08-08 DIAGNOSIS — Z1329 Encounter for screening for other suspected endocrine disorder: Secondary | ICD-10-CM

## 2012-08-08 DIAGNOSIS — Z13220 Encounter for screening for lipoid disorders: Secondary | ICD-10-CM

## 2012-08-08 DIAGNOSIS — I10 Essential (primary) hypertension: Secondary | ICD-10-CM

## 2012-08-08 DIAGNOSIS — F329 Major depressive disorder, single episode, unspecified: Secondary | ICD-10-CM

## 2012-08-08 DIAGNOSIS — K219 Gastro-esophageal reflux disease without esophagitis: Secondary | ICD-10-CM

## 2012-08-08 DIAGNOSIS — F3289 Other specified depressive episodes: Secondary | ICD-10-CM

## 2012-08-08 DIAGNOSIS — Z111 Encounter for screening for respiratory tuberculosis: Secondary | ICD-10-CM

## 2012-08-08 DIAGNOSIS — R5381 Other malaise: Secondary | ICD-10-CM

## 2012-08-08 DIAGNOSIS — F4323 Adjustment disorder with mixed anxiety and depressed mood: Secondary | ICD-10-CM

## 2012-08-08 DIAGNOSIS — G4700 Insomnia, unspecified: Secondary | ICD-10-CM

## 2012-08-08 MED ORDER — HYDROCHLOROTHIAZIDE 25 MG PO TABS: ORAL_TABLET | ORAL | Status: DC

## 2012-08-08 NOTE — Patient Instructions (Addendum)
F/u in mid May.  Blood pressure is high today, you need to take your medication every day at the same time  CBC, fasting lipid, chem 7, TSH, Vit D today today.  Please call and schedule your mammogram , this is past due.  It is important that you exercise regularly at least 30 minutes 5 times a week. If you develop chest pain, have severe difficulty breathing, or feel very tired, stop exercising immediately and seek medical attention    TB test placement today.  Flu vaccine today

## 2012-08-09 ENCOUNTER — Other Ambulatory Visit: Payer: Self-pay | Admitting: Family Medicine

## 2012-08-09 ENCOUNTER — Other Ambulatory Visit (HOSPITAL_COMMUNITY): Payer: Self-pay | Admitting: Internal Medicine

## 2012-08-09 DIAGNOSIS — Z0000 Encounter for general adult medical examination without abnormal findings: Secondary | ICD-10-CM

## 2012-08-09 DIAGNOSIS — Z23 Encounter for immunization: Secondary | ICD-10-CM

## 2012-08-09 DIAGNOSIS — Z139 Encounter for screening, unspecified: Secondary | ICD-10-CM

## 2012-08-09 LAB — LIPID PANEL
Cholesterol: 181 mg/dL (ref 0–200)
HDL: 52 mg/dL (ref >39)
LDL Cholesterol: 112 mg/dL — ABNORMAL HIGH (ref 0–99)
Total CHOL/HDL Ratio: 3.5 Ratio
Triglycerides: 83 mg/dL (ref <150)
VLDL: 17 mg/dL (ref 0–40)

## 2012-08-09 LAB — CBC
HCT: 38.7 % (ref 36.0–46.0)
Hemoglobin: 13.4 g/dL (ref 12.0–15.0)
MCH: 31.1 pg (ref 26.0–34.0)
MCHC: 34.6 g/dL (ref 30.0–36.0)
MCV: 89.8 fL (ref 78.0–100.0)
Platelets: 245 10*3/uL (ref 150–400)
RBC: 4.31 MIL/uL (ref 3.87–5.11)
RDW: 13.6 % (ref 11.5–15.5)
WBC: 6.6 10*3/uL (ref 4.0–10.5)

## 2012-08-09 LAB — BASIC METABOLIC PANEL
BUN: 11 mg/dL (ref 6–23)
CO2: 29 mEq/L (ref 19–32)
Calcium: 10.1 mg/dL (ref 8.4–10.5)
Chloride: 102 mEq/L (ref 96–112)
Creat: 0.86 mg/dL (ref 0.50–1.10)
Glucose, Bld: 82 mg/dL (ref 70–99)
Potassium: 3.8 mEq/L (ref 3.5–5.3)
Sodium: 141 mEq/L (ref 135–145)

## 2012-08-09 LAB — TSH: TSH: 2.985 u[IU]/mL (ref 0.350–4.500)

## 2012-08-09 LAB — VITAMIN D 25 HYDROXY (VIT D DEFICIENCY, FRACTURES): Vit D, 25-Hydroxy: 36 ng/mL (ref 30–89)

## 2012-08-12 NOTE — Assessment & Plan Note (Signed)
Not suicidal or homicidal, overwhelmed with caring for her mother, her personal life is improving, needs counseling

## 2012-08-12 NOTE — Progress Notes (Signed)
  Subjective:    Patient ID: Tiffany Mendoza, female    DOB: Jan 30, 1951, 62 y.o.   MRN: [id number]  HPI The PT is here for follow up and re-evaluation of chronic medical conditions, medication management and review of any available recent lab and radiology data.  Preventive health is updated, specifically  Cancer screening and Immunization.   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 Has noted increased increase anxiety and agitation while dealing with her Mom, asking for help with this.  She is non compliant with taking her bP med daily as prescribed, no specific reason for this      Review of Systems See HPI Denies recent fever or chills. Denies sinus pressure, nasal congestion, ear pain or sore throat. Denies chest congestion, productive cough or wheezing. Denies chest pains, palpitations and leg swelling Denies abdominal pain, nausea, vomiting,diarrhea or constipation.   Denies dysuria, frequency, hesitancy or incontinence. Denies joint pain, swelling and limitation in mobility. Denies headaches, seizures, numbness, or tingling.  Denies skin break down or rash.        Objective:   Physical Exam  Patient alert and oriented and in no cardiopulmonary distress.  HEENT: No facial asymmetry, EOMI, no sinus tenderness,  oropharynx pink and moist.  Neck supple no adenopathy.  Chest: Clear to auscultation bilaterally.  CVS: S1, S2 no murmurs, no S3.  ABD: Soft non tender. Bowel sounds normal.  Ext: No edema  MS: Adequate ROM spine, shoulders, hips and knees.  Skin: Intact, no ulcerations or rash noted.  Psych: Good eye contact, normal affect. Memory intact not anxious or depressed appearing.  CNS: CN 2-12 intact, power, tone and sensation normal throughout.       Assessment & Plan:

## 2012-08-12 NOTE — Assessment & Plan Note (Signed)
Uncontrolled, non compliant with meds. DASH diet and commitment to daily physical activity for a minimum of 30 minutes discussed and encouraged, as a part of hypertension management. The importance of attaining a healthy weight is also discussed.

## 2012-08-12 NOTE — Assessment & Plan Note (Signed)
Controlled, no change in medication  

## 2012-08-12 NOTE — Assessment & Plan Note (Signed)
Sleep hygiene reviewed, continue tranxene

## 2012-08-12 NOTE — Assessment & Plan Note (Signed)
increaasd anxiety and poor impulse control associated wit caring for her Mom. Will refer to psych for counseling

## 2012-08-22 ENCOUNTER — Ambulatory Visit (HOSPITAL_COMMUNITY): Payer: Self-pay | Admitting: Psychiatry

## 2012-09-07 ENCOUNTER — Ambulatory Visit (HOSPITAL_COMMUNITY)
Admission: RE | Admit: 2012-09-07 | Discharge: 2012-09-07 | Disposition: A | Discharge status: Home / Self Care | Payer: Self-pay | Source: Ambulatory Visit | Attending: Family Medicine | Admitting: Family Medicine

## 2012-09-07 DIAGNOSIS — Z139 Encounter for screening, unspecified: Secondary | ICD-10-CM

## 2012-09-07 DIAGNOSIS — Z1231 Encounter for screening mammogram for malignant neoplasm of breast: Secondary | ICD-10-CM | POA: Insufficient documentation

## 2012-11-07 ENCOUNTER — Encounter: Payer: Self-pay | Admitting: Family Medicine

## 2012-11-07 ENCOUNTER — Ambulatory Visit (INDEPENDENT_AMBULATORY_CARE_PROVIDER_SITE_OTHER): Payer: Self-pay | Admitting: Family Medicine

## 2012-11-07 VITALS — BP 110/78 | HR 74 | Resp 16 | Wt 189.1 lb

## 2012-11-07 DIAGNOSIS — F329 Major depressive disorder, single episode, unspecified: Secondary | ICD-10-CM

## 2012-11-07 DIAGNOSIS — K219 Gastro-esophageal reflux disease without esophagitis: Secondary | ICD-10-CM

## 2012-11-07 DIAGNOSIS — I10 Essential (primary) hypertension: Secondary | ICD-10-CM

## 2012-11-07 DIAGNOSIS — F4323 Adjustment disorder with mixed anxiety and depressed mood: Secondary | ICD-10-CM

## 2012-11-07 NOTE — Patient Instructions (Addendum)
F/u in 6 month, call if you need me before  Blood pressure is excellent , no med change   Please commit to daily exercise , at least for 30 minutes each day.  Continue healthy eating this will continue to improve your health

## 2012-11-07 NOTE — Progress Notes (Signed)
  Subjective:    Patient ID: Tiffany Mendoza, female    DOB: Apr 10, 1951, 62 y.o.   MRN: [id number]  HPI The PT is here for follow up and re-evaluation of chronic medical conditions, medication management and review of any available recent lab and radiology data.  Preventive health is updated, specifically  Cancer screening and Immunization.   The PT denies any adverse reactions to current medications since the last visit.  Increased anxiety and mild depression due to lack of steady income and living with her Mother. Not suicidal or homicidal, no hallucinations     Review of Systems See HPI Denies recent fever or chills. Denies sinus pressure, nasal congestion, ear pain or sore throat. Denies chest congestion, productive cough or wheezing. Denies chest pains, palpitations and leg swelling Denies abdominal pain, nausea, vomiting,diarrhea , has chronic constipation which is unchanged   Denies dysuria, frequency, hesitancy or incontinence. Denies joint pain, swelling and limitation in mobility. Denies headaches, seizures, numbness, or tingling.  Denies skin break down or rash.        Objective:   Physical Exam  Patient alert and oriented and in no cardiopulmonary distress.  HEENT: No facial asymmetry, EOMI, no sinus tenderness,  oropharynx pink and moist.  Neck supple no adenopathy.  Chest: Clear to auscultation bilaterally.  CVS: S1, S2 no murmurs, no S3.  ABD: Soft non tender. Bowel sounds normal.  Ext: No edema  MS: Adequate ROM spine, shoulders, hips and knees.  Skin: Intact, no ulcerations or rash noted.  Psych: Good eye contact, flat affect. Memory intact mildly anxious and  depressed appearing.  CNS: CN 2-12 intact, power, tone and sensation normal throughout.       Assessment & Plan:

## 2012-11-14 ENCOUNTER — Other Ambulatory Visit: Payer: Self-pay | Admitting: Family Medicine

## 2012-11-14 ENCOUNTER — Telehealth: Payer: Self-pay

## 2012-11-14 MED ORDER — FLUOXETINE HCL 10 MG PO CAPS: 10.0000 mg | ORAL_CAPSULE | Freq: Every day | ORAL | Status: DC

## 2012-11-14 NOTE — Telephone Encounter (Signed)
Fluoxetine sent in pls let her know

## 2012-11-14 NOTE — Telephone Encounter (Signed)
She did not discuss this when she was here because she has been trying to avoid going on any medicine for this but she has been a little sad/depressed lately and wants to see if she can try an antidepressant like zoloft or prozac. She is familiar with those 2 and wants to see if you would be willing to prescribe one. She has been feeling blue and lack of motivation with sadness for some time now. Finally decided to try to do something about it. Please advise  [facility]

## 2012-11-15 ENCOUNTER — Other Ambulatory Visit: Payer: Self-pay

## 2012-11-15 NOTE — Telephone Encounter (Signed)
Patient aware.

## 2012-11-25 NOTE — Assessment & Plan Note (Signed)
Controlled, no change in medication DASH diet and commitment to daily physical activity for a minimum of 30 minutes discussed and encouraged, as a part of hypertension management. The importance of attaining a healthy weight is also discussed.  

## 2012-11-25 NOTE — Assessment & Plan Note (Signed)
Increased anxiety due to unemployment and living situation, trying to change both

## 2012-11-25 NOTE — Assessment & Plan Note (Signed)
Controlled, no change in medication  

## 2012-11-25 NOTE — Assessment & Plan Note (Signed)
Uncontrolled, not suicidal or homicidal. Resume prozac

## 2012-11-27 ENCOUNTER — Ambulatory Visit (INDEPENDENT_AMBULATORY_CARE_PROVIDER_SITE_OTHER): Payer: Self-pay | Admitting: Obstetrics & Gynecology

## 2012-11-27 ENCOUNTER — Encounter: Payer: Self-pay | Admitting: Obstetrics & Gynecology

## 2012-11-27 VITALS — BP 130/80 | Ht 67.0 in | Wt 187.0 lb

## 2012-11-27 DIAGNOSIS — Z01419 Encounter for gynecological examination (general) (routine) without abnormal findings: Secondary | ICD-10-CM

## 2012-11-27 DIAGNOSIS — Z1212 Encounter for screening for malignant neoplasm of rectum: Secondary | ICD-10-CM

## 2012-11-27 LAB — HEMOCCULT GUIAC POC 1CARD (OFFICE): Fecal Occult Blood, POC: NEGATIVE

## 2012-11-27 NOTE — Patient Instructions (Addendum)
Pelvic Exam A pelvic (gynecologic) exam is an exam of a woman's outer and inner genitals and reproductive organs. At age 64, or before a woman starts to have sexual intercourse, she should have her first pelvic exam. Pelvic exams allow your caregiver to check on normal development and screen for health problems. These exams should be done regularly throughout a woman's life. Usually, a general physical exam is done first. An exam of the breasts is also done. At this visit, you can ask questions about your health, body, menstrual cycles, sex, and birth control methods. Your caregiver will also ask you questions about your health, family health, menstrual periods, immunizations, and if you are sexually active. The information shared between you and your caregiver is kept confidential. REASONS FOR A PELVIC EXAM  Annual exam and Pap test. A Pap test removes cells from the cervix gently with a spatula and a small brush. The cells are tested for infection, precancer, and cancer.  A Pap test is done to screen for cervical cancer.  The first Pap test should be done at age 21.  Between ages 21 and 29, Pap tests are repeated every 2 years.  Beginning at age 30, you are advised to have a Pap test every 3 years as long as your past 3 Pap tests have been normal.  Some women have medical problems that increase the chance of getting cervical cancer. Talk to your caregiver about these problems. It is especially important to talk to your caregiver if a new problem develops soon after your last Pap test. In these cases, your caregiver may recommend more frequent screening and Pap tests.  The above recommendations are the same for women who have or have not gotten the vaccine for HPV (Human Papillomavirus).  If you had a hysterectomy for a problem that was not cancer or a condition that could lead to cancer, then you no longer need Pap tests. However, even if you no longer need a Pap test, a regular exam is a good  idea to make sure no other problems are starting.   If you are between ages 65 and 70, and you have had normal Pap tests going back 10 years, you no longer need Pap tests. However, even if you no longer need a Pap test, a regular exam is a good idea to make sure no other problems are starting.   If you have had past treatment for cervical cancer or a condition that could lead to cancer, you need Pap tests and screening for cancer for at least 20 years after your treatment.  If Pap tests have been discontinued, risk factors (such as a new sexual partner) need to be re-assessed to determine if screening should be resumed.  Some women may need screenings more often if they are at high risk for cervical cancer.  Make sure your female organs are normal and functioning correctly.  Evaluate a mass or other symptoms that suggest a reproductive system cancer.  Explore why you are not able to get pregnant (infertility).  Find a cause for vaginal discharge, itching, or burning.  Get certain types of birth control or start hormone therapy.  Look for causes of urinary incontinence or sexual problems.  Look for signs of sexually transmitted infection (STI).  Follow the progression of labor.  Determine if pregnancy is present or how far advanced the pregnancy is.  You have severe cramps during your menstrual period.  You have pain during sexual intercourse.  You have abnormal   menstrual periods.  You have no menstrual period by the age of 16. PROCEDURE   A pelvic exam is usually painless but may cause mild discomfort.  In unusual circumstances or in young girls, medicines may be used for comfort. A pelvic exam is not done routinely before a girl is sexually active. Special circumstances such as rape, trauma, or medical problems may require an exam.  You will remove all your clothes and will be given a gown. Usually, there is a nurse in the room during the exam and you can have someone  from your family with you also.  The general physical exam will be done first.  Before the pelvic exam starts, the woman lies down on her back on a special table. She puts the heels of her feet into foot rests (stirrups) with her legs apart. A gown, cloth, or paper drape is usually placed over her belly (abdomen) and legs. First, the caregiver checks the normal arrangement of body parts of the outer genitals. This includes the clitoris, vaginal opening, hymen, labia, and the perineal area between the vagina and rectum. The labia are the skin folds surrounding the vaginal opening. The tube that carries urine (urethra) is also examined.  An internal exam is done next. First, the caregiver inserts an instrument called a speculum into the vagina. The speculum has lubricant on it. The speculum helps hold the vaginal walls apart. The caregiver can then examine the vagina and cervix, which is the opening to the womb (uterus). Cultures of any discharge may be taken to check for an infection. A Pap test may be done.  After the internal exam is done, the speculum is removed. The caregiver uses latex gloves with a lubricant on the fingers to gently press against various pelvic organs from inside the vagina while the other hand is on the lower belly. The caregiver will note any tenderness or abnormalities.  If a pelvic exam is done on a woman who is thought to be in labor, her caregiver can check on the baby and how far her cervix has opened.  Following the exam, you will get dressed and can speak with your caregiver.  Ask your caregiver when and how often you should return for future visits. Finding out the results of your test Ask when your test results will be ready. Make sure you get your test results. TO HAVE A HEALTHY LIFESTYLE:  Follow your caregiver's advice regarding follow-up and future visits.  Get the necessary immunizations according to your age and any traveling you may do.  Eat a balanced,  nourishing diet.  Get plenty of rest and sleep.  Exercise regularly.  Maintain a healthy weight.  Do not smoke or take illegal drugs.  Drink alcohol in moderation or not at all.  If you are sexually active, use some form of birth control if you do not plan to get pregnant.  If you are sexually active, practice safe sex by using a condom to protect against sexually transmitted disease (STD).  Get help or counseling if you have emotional problems. Document Released: 08/27/2002 Document Revised: 08/29/2011 Document Reviewed: 09/02/2009 [employer] Patient Information 2014 [employer].  

## 2012-11-27 NOTE — Progress Notes (Signed)
Patient ID: SMITH MCNICHOLAS, female   DOB: May 18, 1951, 62 y.o.   MRN: [id number] Subjective:     Tiffany Mendoza is a 62 y.o. female here for a routine exam.  No LMP recorded. Patient has had a hysterectomy. No obstetric history on file. Current complaints: none.  Personal health questionnaire reviewed: no.   Gynecologic History No LMP recorded. Patient has had a hysterectomy. Contraception: status post hysterectomy Last Pap: 2010. Results were: normal Last mammogram: 2013. Results were: normal  Obstetric History OB History   Grav Para Term Preterm Abortions TAB SAB Ect Mult Living                   The following portions of the patient's history were reviewed and updated as appropriate: allergies, current medications, past family history, past medical history, past social history, past surgical history and problem list.  Review of Systems  Review of Systems  Constitutional: Negative for fever, chills, weight loss, malaise/fatigue and diaphoresis.  HENT: Negative for hearing loss, ear pain, nosebleeds, congestion, sore throat, neck pain, tinnitus and ear discharge.   Eyes: Negative for blurred vision, double vision, photophobia, pain, discharge and redness.  Respiratory: Negative for cough, hemoptysis, sputum production, shortness of breath, wheezing and stridor.   Cardiovascular: Negative for chest pain, palpitations, orthopnea, claudication, leg swelling and PND.  Gastrointestinal: negative for abdominal pain. Negative for heartburn, nausea, vomiting, diarrhea, constipation, blood in stool and melena.  Genitourinary: Negative for dysuria, urgency, frequency, hematuria and flank pain.  Musculoskeletal: Negative for myalgias, back pain, joint pain and falls.  Skin: Negative for itching and rash.  Neurological: Negative for dizziness, tingling, tremors, sensory change, speech change, focal weakness, seizures, loss of consciousness, weakness and headaches.  Endo/Heme/Allergies:  Negative for environmental allergies and polydipsia. Does not bruise/bleed easily.  Psychiatric/Behavioral: Negative for depression, suicidal ideas, hallucinations, memory loss and substance abuse. The patient is not nervous/anxious and does not have insomnia.        Objective:    Physical Exam  Vitals reviewed. Constitutional: She is oriented to person, place, and time. She appears well-developed and well-nourished.  HENT:  Head: Normocephalic and atraumatic.        Right Ear: External ear normal.  Left Ear: External ear normal.  Nose: Nose normal.  Mouth/Throat: Oropharynx is clear and moist.  Eyes: Conjunctivae and EOM are normal. Pupils are equal, round, and reactive to light. Right eye exhibits no discharge. Left eye exhibits no discharge. No scleral icterus.  Neck: Normal range of motion. Neck supple. No tracheal deviation present. No thyromegaly present.  Cardiovascular: Normal rate, regular rhythm, normal heart sounds and intact distal pulses.  Exam reveals no gallop and no friction rub.   No murmur heard. Respiratory: Effort normal and breath sounds normal. No respiratory distress. She has no wheezes. She has no rales. She exhibits no tenderness.  GI: Soft. Bowel sounds are normal. She exhibits no distension and no mass. There is no tenderness. There is no rebound and no guarding.  Genitourinary:       Vulva is normal without lesions Vagina is pink moist without discharge Cervix absent Uterus absent Adnexa is negative with no ovaries   Musculoskeletal: Normal range of motion. She exhibits no edema and no tenderness.  Neurological: She is alert and oriented to person, place, and time. She has normal reflexes. She displays normal reflexes. No cranial nerve deficit. She exhibits normal muscle tone. Coordination normal.  Skin: Skin is warm and dry. No rash noted.  No erythema. No pallor.  Psychiatric: She has a normal mood and affect. Her behavior is normal. Judgment and thought  content normal.       Assessment:    Healthy female exam.    Plan:    Follow up in: 1 year.

## 2013-01-24 ENCOUNTER — Telehealth: Payer: Self-pay | Admitting: Family Medicine

## 2013-01-25 NOTE — Telephone Encounter (Signed)
Noted  

## 2013-01-25 NOTE — Telephone Encounter (Signed)
Let her know that the hospital has group classes on an intermittent basis,keep an ear open for that, explain that we have not got a direct response though message has been sent, I believe group session is what is available. Pls also see if the hospital can give you any info on when next session will be held Thanks

## 2013-01-25 NOTE — Telephone Encounter (Signed)
Daughter aware of appointment summary from her mother's visit on 8/7.  She is also aware of xray results.  She is now asking if there is any information on help with living will.  Email was sent to [facility], with no response.  Please advise.

## 2013-03-12 ENCOUNTER — Other Ambulatory Visit: Payer: Self-pay | Admitting: Family Medicine

## 2013-04-25 ENCOUNTER — Other Ambulatory Visit: Payer: Self-pay

## 2013-05-08 ENCOUNTER — Ambulatory Visit: Payer: Self-pay | Admitting: Family Medicine

## 2013-07-12 ENCOUNTER — Other Ambulatory Visit: Payer: Self-pay | Admitting: Family Medicine

## 2013-08-01 ENCOUNTER — Telehealth: Payer: Self-pay

## 2013-08-01 DIAGNOSIS — R5381 Other malaise: Secondary | ICD-10-CM

## 2013-08-01 DIAGNOSIS — I10 Essential (primary) hypertension: Secondary | ICD-10-CM

## 2013-08-01 DIAGNOSIS — R5383 Other fatigue: Secondary | ICD-10-CM

## 2013-08-01 DIAGNOSIS — E785 Hyperlipidemia, unspecified: Secondary | ICD-10-CM

## 2013-08-01 NOTE — Telephone Encounter (Signed)
Has appt for Mar 31 and wants me to mail her lab order to her. Please advise what you would like me to order.

## 2013-08-02 NOTE — Telephone Encounter (Signed)
pls order cbc, fasting chem 7 and lipid. Pls also send a note with iit that the absolutely only one she nEEDS based on meds is her kidney function test. Her bad chol was slightly high last year , so if she wants to follow up ion this then order, her blood count was normal last year. Explain things can CHANGE and unless it is checked we will not know. Explain if cost, or dx coverage is an issue she needs to let me know if she wants to cancel any ordered labs,  Two have a code , HTN and dyslipidemia, cbc is just 'Screening" and may not be covered, I cannot tell Frankly I think based on probs with coding and billing best if labs are done after visit , based on symptoms she describes pls ex[plain

## 2013-08-02 NOTE — Addendum Note (Signed)
Addended by: Eual Fines on: 08/02/2013 03:21 PM   Modules accepted: Orders

## 2013-08-02 NOTE — Telephone Encounter (Signed)
Patient would like blood work before visit and will mail to her

## 2013-08-21 ENCOUNTER — Ambulatory Visit: Payer: Self-pay | Admitting: Family Medicine

## 2013-09-13 LAB — CBC WITH DIFFERENTIAL/PLATELET
Basophils Absolute: 0 10*3/uL (ref 0.0–0.1)
Basophils Relative: 1 % (ref 0–1)
Eosinophils Absolute: 0.1 10*3/uL (ref 0.0–0.7)
Eosinophils Relative: 3 % (ref 0–5)
HCT: 38.9 % (ref 36.0–46.0)
Hemoglobin: 13.3 g/dL (ref 12.0–15.0)
Lymphocytes Relative: 49 % — ABNORMAL HIGH (ref 12–46)
Lymphs Abs: 2.2 10*3/uL (ref 0.7–4.0)
MCH: 31.1 pg (ref 26.0–34.0)
MCHC: 34.2 g/dL (ref 30.0–36.0)
MCV: 90.9 fL (ref 78.0–100.0)
Monocytes Absolute: 0.4 10*3/uL (ref 0.1–1.0)
Monocytes Relative: 8 % (ref 3–12)
Neutro Abs: 1.7 10*3/uL (ref 1.7–7.7)
Neutrophils Relative %: 39 % — ABNORMAL LOW (ref 43–77)
Platelets: 254 10*3/uL (ref 150–400)
RBC: 4.28 MIL/uL (ref 3.87–5.11)
RDW: 13.6 % (ref 11.5–15.5)
WBC: 4.4 10*3/uL (ref 4.0–10.5)

## 2013-09-14 LAB — BASIC METABOLIC PANEL
BUN: 9 mg/dL (ref 6–23)
CO2: 28 mEq/L (ref 19–32)
Calcium: 9 mg/dL (ref 8.4–10.5)
Chloride: 106 mEq/L (ref 96–112)
Creat: 0.82 mg/dL (ref 0.50–1.10)
Glucose, Bld: 99 mg/dL (ref 70–99)
Potassium: 3.9 mEq/L (ref 3.5–5.3)
Sodium: 141 mEq/L (ref 135–145)

## 2013-09-14 LAB — LIPID PANEL
Cholesterol: 149 mg/dL (ref 0–200)
HDL: 49 mg/dL (ref >39)
LDL Cholesterol: 87 mg/dL (ref 0–99)
Total CHOL/HDL Ratio: 3 Ratio
Triglycerides: 63 mg/dL (ref <150)
VLDL: 13 mg/dL (ref 0–40)

## 2013-09-17 ENCOUNTER — Ambulatory Visit (INDEPENDENT_AMBULATORY_CARE_PROVIDER_SITE_OTHER): Payer: Medicare Other | Admitting: Family Medicine

## 2013-09-17 ENCOUNTER — Encounter: Payer: Self-pay | Admitting: Family Medicine

## 2013-09-17 VITALS — BP 122/82 | HR 95 | Resp 18 | Ht 68.0 in | Wt 185.1 lb

## 2013-09-17 DIAGNOSIS — F4323 Adjustment disorder with mixed anxiety and depressed mood: Secondary | ICD-10-CM

## 2013-09-17 DIAGNOSIS — F329 Major depressive disorder, single episode, unspecified: Secondary | ICD-10-CM

## 2013-09-17 DIAGNOSIS — F3289 Other specified depressive episodes: Secondary | ICD-10-CM

## 2013-09-17 DIAGNOSIS — Z13820 Encounter for screening for osteoporosis: Secondary | ICD-10-CM

## 2013-09-17 DIAGNOSIS — I10 Essential (primary) hypertension: Secondary | ICD-10-CM

## 2013-09-17 DIAGNOSIS — K219 Gastro-esophageal reflux disease without esophagitis: Secondary | ICD-10-CM

## 2013-09-17 DIAGNOSIS — Z7189 Other specified counseling: Secondary | ICD-10-CM

## 2013-09-17 DIAGNOSIS — R7301 Impaired fasting glucose: Secondary | ICD-10-CM

## 2013-09-17 MED ORDER — SERTRALINE HCL 25 MG PO TABS: 25.0000 mg | ORAL_TABLET | Freq: Every day | ORAL | Status: DC

## 2013-09-17 NOTE — Patient Instructions (Addendum)
F/u in  8 weeks, please call if you need me before  Pneumonia vaccine today, and check with your pharmacy re cost of shingles vaccine, please get this in 4 to 5 weeks if able (should be affordable)  Bone density test is recommended and you are referred for this   Please consider seeing a therapist to help with mental health issues, if you decide call so we can refer  You are "self medicating" with ice cream, please discontinue and , rely on  fruit as your source of sugar  Commit to getting out of bed sooner and exercising, and I hope that you are able to get employment in the near future.  I DO recommend resuming zoloft 25 mg daily to help you as you work on these changes, and will re evalaute you in 8 weeks   Labs are excellent, except blood sugar too high, under 100 is normal , you are 99!  Fall Prevention and Home Safety Falls cause injuries and can affect all age groups. It is possible to prevent falls.  HOW TO PREVENT FALLS  Wear shoes with rubber soles that do not have an opening for your toes.  Keep the inside and outside of your house well lit.  Use night lights throughout your home.  Remove clutter from floors.  Clean up floor spills.  Remove throw rugs or fasten them to the floor with carpet tape.  Do not place electrical cords across pathways.  Put grab bars by your tub, shower, and toilet. Do not use towel bars as grab bars.  Put handrails on both sides of the stairway. Fix loose handrails.  Do not climb on stools or stepladders, if possible.  Do not wax your floors.  Repair uneven or unsafe sidewalks, walkways, or stairs.  Keep items you use a lot within reach.  Be aware of pets.  Keep emergency numbers next to the telephone.  Put smoke detectors in your home and near bedrooms. Ask your doctor what other things you can do to prevent falls. Document Released: 04/02/2009 Document Revised: 12/06/2011 Document Reviewed: 09/06/2011 [facility] Patient  Information 2014 [place], [place].

## 2013-09-17 NOTE — Progress Notes (Signed)
   Subjective:    Patient ID: Tiffany Mendoza, female    DOB: [birth year], 63 y.o.   MRN: [id number]  HPI The PT is here for follow up and re-evaluation of chronic medical conditions, medication management and review of any available recent lab and radiology data.  Preventive health is updated, specifically  Cancer screening and Immunization.   Has had pelvic exam since last seen The PT denies any adverse reactions to current medications since the last visit.  C/o increased and uncontrolled symptoms of depression and anxiety, increasingly pressured, living with and caring for her Mom and remaining unemployed, wants to resume zoloft, no interest in therapy currently , not suicidal or homicidal, working on Arts development officer a book. Over medicating on icecream    Review of Systems See HPI Denies recent fever or chills. Denies sinus pressure, nasal congestion, ear pain or sore throat. Denies chest congestion, productive cough or wheezing. Denies chest pains, palpitations and leg swelling Denies abdominal pain, nausea, vomiting,diarrhea or constipation.   Denies dysuria, frequency, hesitancy or incontinence. Denies joint pain, swelling and limitation in mobility. Denies headaches, seizures, numbness, or tingling.  Denies skin break down or rash.        Objective:   Physical Exam BP 122/82  Pulse 95  Resp 18  Ht 5\' 8"  (1.727 m)  Wt 185 lb 1.9 oz (83.97 kg)  BMI 28.15 kg/m2  SpO2 95% Patient alert and oriented and in no cardiopulmonary distress.  HEENT: No facial asymmetry, EOMI, no sinus tenderness,  oropharynx pink and moist.  Neck supple no adenopathy.  Chest: Clear to auscultation bilaterally.  CVS: S1, S2 no murmurs, no S3.  ABD: Soft non tender. Bowel sounds normal.  Ext: No edema  MS: Adequate ROM spine, shoulders, hips and knees.  Skin: Intact, no ulcerations or rash noted.  Psych: Good eye contact, normal affect. Memory intact not anxious but mildly depressed  appearing.  CNS: CN 2-12 intact, power, tone and sensation normal throughout.        Assessment & Plan:  HYPERTENSION Controlled, no change in medication DASH diet and commitment to daily physical activity for a minimum of 30 minutes discussed and encouraged, as a part of hypertension management. The importance of attaining a healthy weight is also discussed.   DEPRESSION Resume zoloft, therapy offered but no interest at thjs ttime  ADJ DISORDER WITH MIXED ANXIETY & DEPRESSED MOOD Continue tranxene at night for sleep Sleep hygiene reviewed. \Pt still struggliung to get full time employment, also trying to get a book published  GERD Controlled with zantac as needed  Encounter for home safety review for injury prevention Home safety discussed and information also provided  IFG (impaired fasting glucose) Fasting blood glucose of 100, with over indulgence on ice cream Patient educated about the importance of limiting  Carbohydrate intake , the need to commit to daily physical activity for a minimum of 30 minutes , and to commit weight loss. The fact that changes in all these areas will reduce or eliminate all together the development of diabetes is stressed.

## 2013-09-24 ENCOUNTER — Telehealth: Payer: Self-pay | Admitting: Family Medicine

## 2013-09-24 NOTE — Telephone Encounter (Signed)
Concern addressed

## 2013-09-30 ENCOUNTER — Telehealth: Payer: Self-pay | Admitting: Family Medicine

## 2013-09-30 ENCOUNTER — Telehealth: Payer: Self-pay

## 2013-09-30 ENCOUNTER — Other Ambulatory Visit (HOSPITAL_COMMUNITY): Payer: Medicare Other

## 2013-09-30 NOTE — Telephone Encounter (Signed)
Noted  

## 2013-09-30 NOTE — Telephone Encounter (Signed)
Patient wants to know if she can increase her zoloft. Doesn't seem to be helping and she wants to go ahead and increase it before shes due to come back in 8 weeks. Please advise if increase is ok.

## 2013-10-06 NOTE — Telephone Encounter (Signed)
Change to zoloft 50mg  daily #30 refill 2, and send in I will sign, pls let her know also. Again remind her of recommendation to see a therapist , referral was entered already

## 2013-10-07 ENCOUNTER — Other Ambulatory Visit: Payer: Self-pay

## 2013-10-07 MED ORDER — SERTRALINE HCL 50 MG PO TABS: 50.0000 mg | ORAL_TABLET | Freq: Every day | ORAL | Status: DC

## 2013-10-07 NOTE — Telephone Encounter (Signed)
Med increased and patient aware

## 2013-11-04 ENCOUNTER — Other Ambulatory Visit: Payer: Self-pay | Admitting: Family Medicine

## 2013-11-11 DIAGNOSIS — R7301 Impaired fasting glucose: Secondary | ICD-10-CM | POA: Insufficient documentation

## 2013-11-11 DIAGNOSIS — Z7189 Other specified counseling: Secondary | ICD-10-CM | POA: Insufficient documentation

## 2013-11-11 NOTE — Assessment & Plan Note (Signed)
Controlled with zantac as needed

## 2013-11-11 NOTE — Assessment & Plan Note (Signed)
Controlled, no change in medication DASH diet and commitment to daily physical activity for a minimum of 30 minutes discussed and encouraged, as a part of hypertension management. The importance of attaining a healthy weight is also discussed.  

## 2013-11-11 NOTE — Assessment & Plan Note (Signed)
Continue tranxene at night for sleep Sleep hygiene reviewed. \Pt still struggliung to get full time employment, also trying to get a book published

## 2013-11-11 NOTE — Assessment & Plan Note (Signed)
Home safety discussed and information also provided

## 2013-11-11 NOTE — Assessment & Plan Note (Signed)
Resume zoloft, therapy offered but no interest at thjs ttime

## 2013-11-11 NOTE — Assessment & Plan Note (Addendum)
Fasting blood glucose of 100, with over indulgence on ice cream Patient educated about the importance of limiting  Carbohydrate intake , the need to commit to daily physical activity for a minimum of 30 minutes , and to commit weight loss. The fact that changes in all these areas will reduce or eliminate all together the development of diabetes is stressed.

## 2013-11-14 ENCOUNTER — Encounter: Payer: Self-pay | Admitting: Family Medicine

## 2013-11-14 ENCOUNTER — Ambulatory Visit (INDEPENDENT_AMBULATORY_CARE_PROVIDER_SITE_OTHER): Payer: Medicare Other | Admitting: Family Medicine

## 2013-11-14 VITALS — BP 118/74 | HR 91 | Resp 16 | Wt 182.0 lb

## 2013-11-14 DIAGNOSIS — I10 Essential (primary) hypertension: Secondary | ICD-10-CM

## 2013-11-14 DIAGNOSIS — K219 Gastro-esophageal reflux disease without esophagitis: Secondary | ICD-10-CM

## 2013-11-14 DIAGNOSIS — Z7189 Other specified counseling: Secondary | ICD-10-CM

## 2013-11-14 DIAGNOSIS — R7301 Impaired fasting glucose: Secondary | ICD-10-CM

## 2013-11-14 DIAGNOSIS — F329 Major depressive disorder, single episode, unspecified: Secondary | ICD-10-CM

## 2013-11-14 DIAGNOSIS — F3289 Other specified depressive episodes: Secondary | ICD-10-CM

## 2013-11-14 MED ORDER — CLORAZEPATE DIPOTASSIUM 7.5 MG PO TABS: ORAL_TABLET | ORAL | Status: DC

## 2013-11-14 NOTE — Patient Instructions (Addendum)
Welcome to medicare 4.5 month, call if you ned me before  Zostavax  As soon as possible  No changes in medication at this time  You will dEFINITELY benefit from therapy, so pLs fill out the form!

## 2013-11-16 NOTE — Assessment & Plan Note (Signed)
Home safety discussed and literature provided

## 2013-11-16 NOTE — Assessment & Plan Note (Signed)
Controlled, no change in medication  

## 2013-11-16 NOTE — Assessment & Plan Note (Addendum)
improveed and controlled on zoloft 50mg  daily Needs therapy to help her to work through her personal issues, encouraged to follow through on this

## 2013-11-16 NOTE — Progress Notes (Signed)
   Subjective:    Patient ID: Tiffany Mendoza, female    DOB: June 28, 1950, 63 y.o.   MRN: [id number]  HPI The PT is here for follow up and re-evaluation of chronic medical conditions, medication management and review of any available recent lab and radiology data.  Preventive health is updated, specifically  Cancer screening and Immunization.   Questions or concerns regarding consultations or procedures which the PT has had in the interim are  Addressed.Has upcoming gyne appt where she will get her dexa done also. Still did not follow through wit psychologic counseling though she realizes that it could be beneficial, states paperwork to be completed was a lot, states she realizes that a big problem for her is the fact that she is still not independent in her own home and has not accomplished things she wished she had at an earlier age. Between visits, she called in requesting a higher dose of zoloft and feels this was more effective The PT denies any adverse reactions to current medications since the last visit.  C/o acute GI disturbance 4 nights ago, with residual abdominal cramps and loose stool, not excessive, probavbly 3 per day, no blood or mucus, had chills and fatigue, no documented fever, symptoms have essentially resolved       Review of Systems See HPI Denies recent fever or chills. Denies sinus pressure, nasal congestion, ear pain or sore throat. Denies chest congestion, productive cough or wheezing. Denies chest pains, palpitations and leg swelling    Denies dysuria, frequency, hesitancy or incontinence. Denies joint pain, swelling and limitation in mobility. Denies headaches, seizures, numbness, or tingling. Denies skin break down or rash.        Objective:   Physical Exam BP 118/74  Pulse 91  Resp 16  Wt 182 lb (82.555 kg)  SpO2 97% Patient alert and oriented and in no cardiopulmonary distress.  HEENT: No facial asymmetry, EOMI, no sinus tenderness,  oropharynx  pink and moist.  Neck supple no adenopathy.  Chest: Clear to auscultation bilaterally.  CVS: S1, S2 no murmurs, no S3.  ABD: Soft non tender.  Ext: No edema  MS: Adequate ROM spine, shoulders, hips and knees.  Skin: Intact, no ulcerations or rash noted.  Psych: Good eye contact, normal affect. Memory intact mildly  anxious and  depressed appearing.  CNS: CN 2-12 intact, power, t normal throughout.        Assessment & Plan:  HYPERTENSION Controlled, no change in medication DASH diet and commitment to daily physical activity for a minimum of 30 minutes discussed and encouraged, as a part of hypertension management. The importance of attaining a healthy weight is also discussed.   GERD Controlled, no change in medication   DEPRESSION improveed and controlled on zoloft 50mg  daily Needs therapy to help her to work through her personal issues, encouraged to follow through on this  Encounter for home safety review for injury prevention Home safety discussed and literature provided  IFG (impaired fasting glucose) Patient educated about the importance of limiting  Carbohydrate intake , the need to commit to daily physical activity for a minimum of 30 minutes , and to commit weight loss. The fact that changes in all these areas will reduce or eliminate all together the development of diabetes is stressed.   Updated lab needed at/ before next visit.

## 2013-11-16 NOTE — Assessment & Plan Note (Signed)
Patient educated about the importance of limiting  Carbohydrate intake , the need to commit to daily physical activity for a minimum of 30 minutes , and to commit weight loss. The fact that changes in all these areas will reduce or eliminate all together the development of diabetes is stressed.   Updated lab needed at/ before next visit.  

## 2013-11-16 NOTE — Assessment & Plan Note (Signed)
Controlled, no change in medication DASH diet and commitment to daily physical activity for a minimum of 30 minutes discussed and encouraged, as a part of hypertension management. The importance of attaining a healthy weight is also discussed.  

## 2013-12-17 ENCOUNTER — Ambulatory Visit: Payer: Medicare Other | Admitting: Family Medicine

## 2014-01-14 ENCOUNTER — Other Ambulatory Visit: Payer: Self-pay | Admitting: Family Medicine

## 2014-01-30 ENCOUNTER — Encounter: Payer: Self-pay | Admitting: Family Medicine

## 2014-01-30 ENCOUNTER — Ambulatory Visit (INDEPENDENT_AMBULATORY_CARE_PROVIDER_SITE_OTHER): Payer: Medicare Other | Admitting: Family Medicine

## 2014-01-30 VITALS — BP 126/82 | HR 76 | Resp 18 | Ht 68.0 in | Wt 177.1 lb

## 2014-01-30 DIAGNOSIS — G4700 Insomnia, unspecified: Secondary | ICD-10-CM

## 2014-01-30 DIAGNOSIS — I10 Essential (primary) hypertension: Secondary | ICD-10-CM

## 2014-01-30 DIAGNOSIS — F329 Major depressive disorder, single episode, unspecified: Secondary | ICD-10-CM

## 2014-01-30 DIAGNOSIS — K219 Gastro-esophageal reflux disease without esophagitis: Secondary | ICD-10-CM

## 2014-01-30 DIAGNOSIS — F3289 Other specified depressive episodes: Secondary | ICD-10-CM

## 2014-01-30 MED ORDER — SERTRALINE HCL 100 MG PO TABS: 100.0000 mg | ORAL_TABLET | Freq: Every day | ORAL | Status: DC

## 2014-01-30 NOTE — Patient Instructions (Signed)
F/u as before  Dose increase in zoloft as we discussed.  I ABSOLUTELY belive that  Sessions with the therapist that I recommended, will be of great benefit , an respectfully ask that you reconsider this and that you do go

## 2014-02-02 NOTE — Assessment & Plan Note (Addendum)
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

## 2014-02-02 NOTE — Progress Notes (Signed)
   Subjective:    Patient ID: Tiffany Mendoza, female    DOB: 12/14/1950, 63 y.o.   MRN: [id number]  HPI Pt in with concerns of fatigue, inability to remain focussed on getting ahead as she wants to . Repeats goal of living on her own, no specific plan as to what will happen to her mom who she cares for, states she will get a sibling in [place] to be more involved. Repts same concerns of insufficient money and space. Not suicidal or homicidal, remains frustrated, has not yet seen therapist which she needs Sleep is fair C/o sore area on right flank wants this checked, no drainage , warmth or significant pain associated with it, thinks she may have picked off a scab   Review of Systems See HPI Denies recent fever or chills. Denies sinus pressure, nasal congestion, ear pain or sore throat. Denies chest congestion, productive cough or wheezing. Denies chest pains, palpitations and leg swelling Denies abdominal pain, nausea, vomiting,diarrhea or constipation.   Denies dysuria, frequency, hesitancy or incontinence. Denies joint pain, swelling and limitation in mobility. Denies headaches, seizures, numbness, or tingling.       Objective:   Physical Exam BP 126/82  Pulse 76  Resp 18  Ht 5\' 8"  (1.727 m)  Wt 177 lb 1.9 oz (80.341 kg)  BMI 26.94 kg/m2  SpO2 100% Patient alert and oriented and in no cardiopulmonary distress.  HEENT: No facial asymmetry, EOMI,   oropharynx pink and moist.  Neck supple no JVD, no mass.  Chest: Clear to auscultation bilaterally.  CVS: S1, S2 no murmurs, no S3.Regular rate.  ABD: Soft non tender.   Ext: No edema  MS: Adequate ROM spine, shoulders, hips and knees.  Skin: Intact, healed scab on right flank in area of concern, no sign of infectrion  Psych: Good eye contact, normal affect. Memory intact  anxious , mildly depressed appearing.  CNS: CN 2-12 intact, power,  normal throughout.no focal deficits noted.        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 Prescription sent for  medication needed. Tranxene as before   HYPERTENSION Controlled, no change in medication DASH diet and commitment to daily physical activity for a minimum of 30 minutes discussed and encouraged, as a part of hypertension management. The importance of attaining a healthy weight is also discussed.   GERD Controlled, no change in medication

## 2014-02-02 NOTE — Assessment & Plan Note (Signed)
Controlled, no change in medication  

## 2014-02-02 NOTE — Assessment & Plan Note (Signed)
Sleep hygiene reviewed and written information offered also. Prescription sent for  medication needed. Tranxene as before

## 2014-02-02 NOTE — Assessment & Plan Note (Signed)
Controlled, no change in medication DASH diet and commitment to daily physical activity for a minimum of 30 minutes discussed and encouraged, as a part of hypertension management. The importance of attaining a healthy weight is also discussed.  

## 2014-02-17 ENCOUNTER — Other Ambulatory Visit: Payer: Self-pay | Admitting: Family Medicine

## 2014-03-07 ENCOUNTER — Encounter: Payer: Self-pay | Admitting: Internal Medicine

## 2014-03-18 ENCOUNTER — Encounter: Payer: Self-pay | Admitting: Family Medicine

## 2014-03-18 ENCOUNTER — Ambulatory Visit (INDEPENDENT_AMBULATORY_CARE_PROVIDER_SITE_OTHER): Payer: Medicare Other | Admitting: Family Medicine

## 2014-03-18 VITALS — BP 126/72 | HR 74 | Resp 18 | Ht 68.0 in | Wt 177.1 lb

## 2014-03-18 DIAGNOSIS — F341 Dysthymic disorder: Secondary | ICD-10-CM | POA: Diagnosis not present

## 2014-03-18 DIAGNOSIS — H9319 Tinnitus, unspecified ear: Secondary | ICD-10-CM | POA: Diagnosis not present

## 2014-03-18 DIAGNOSIS — Z23 Encounter for immunization: Secondary | ICD-10-CM

## 2014-03-18 DIAGNOSIS — R7301 Impaired fasting glucose: Secondary | ICD-10-CM

## 2014-03-18 DIAGNOSIS — I10 Essential (primary) hypertension: Secondary | ICD-10-CM | POA: Diagnosis not present

## 2014-03-18 DIAGNOSIS — F418 Other specified anxiety disorders: Secondary | ICD-10-CM

## 2014-03-18 DIAGNOSIS — F5105 Insomnia due to other mental disorder: Secondary | ICD-10-CM

## 2014-03-18 DIAGNOSIS — F489 Nonpsychotic mental disorder, unspecified: Secondary | ICD-10-CM

## 2014-03-18 NOTE — Patient Instructions (Addendum)
F/u in 5 months, call if you need me before  Flu vaccine today     Fasting chem 7 and TSH this week  You are referred to ENT per your request for tinnitus

## 2014-03-19 ENCOUNTER — Telehealth: Payer: Self-pay | Admitting: Family Medicine

## 2014-03-20 NOTE — Telephone Encounter (Signed)
Noted and given to referral staff

## 2014-03-20 NOTE — Telephone Encounter (Signed)
Script signed pls complete , fax and let her know

## 2014-03-28 ENCOUNTER — Encounter: Payer: Self-pay | Admitting: Family Medicine

## 2014-03-28 LAB — BASIC METABOLIC PANEL
BUN: 9 mg/dL (ref 6–23)
CO2: 27 mEq/L (ref 19–32)
Calcium: 9.2 mg/dL (ref 8.4–10.5)
Chloride: 102 mEq/L (ref 96–112)
Creat: 0.74 mg/dL (ref 0.50–1.10)
Glucose, Bld: 85 mg/dL (ref 70–99)
Potassium: 3.7 mEq/L (ref 3.5–5.3)
Sodium: 138 mEq/L (ref 135–145)

## 2014-03-28 LAB — TSH: TSH: 2.156 u[IU]/mL (ref 0.350–4.500)

## 2014-05-07 ENCOUNTER — Encounter: Payer: Self-pay | Admitting: Family Medicine

## 2014-05-13 ENCOUNTER — Ambulatory Visit (INDEPENDENT_AMBULATORY_CARE_PROVIDER_SITE_OTHER): Payer: Medicare Other | Admitting: Internal Medicine

## 2014-05-13 ENCOUNTER — Encounter: Payer: Self-pay | Admitting: Internal Medicine

## 2014-05-13 VITALS — BP 112/70 | HR 77 | Ht 66.0 in | Wt 178.2 lb

## 2014-05-13 DIAGNOSIS — K5900 Constipation, unspecified: Secondary | ICD-10-CM

## 2014-05-13 DIAGNOSIS — K644 Residual hemorrhoidal skin tags: Secondary | ICD-10-CM

## 2014-05-13 DIAGNOSIS — K5909 Other constipation: Secondary | ICD-10-CM

## 2014-05-13 DIAGNOSIS — K648 Other hemorrhoids: Secondary | ICD-10-CM

## 2014-05-13 NOTE — Progress Notes (Signed)
   Subjective:    Patient ID: Tiffany Mendoza, female    DOB: 1951-03-30, 63 y.o.   MRN: [id number]  HPI This is middle-aged to elderly black woman with a hx of chronic constipation and hemorrhoids. She has previously seen my retired partner Dr. Sharlett Iles. She manages her constipation fairly well with diet and psyllium husk. She thinks her hemorrhoids might be flaring with swelling from time to time and wants to get checked. No major complaints today. No rectal bleeding, just some swelling and irritation occasionally. She uses a Fleet enema from time to time as well. She had a hemorrhoidectomy > 10 yrs ago by Dr. Lennie Hummer. Medications, allergies, past medical history, past surgical history, family history and social history are reviewed and updated in the EMR.    Review of Systems All other ROs negative except as per HPI    Objective:   Physical Exam General:  Well-developed, well-nourished and in no acute distress Eyes:  anicteric. Lungs: Clear to auscultation bilaterally. Heart:  S1S2, no rubs, murmurs, gallops. Abdomen:  soft, non-tender, no hepatosplenomegaly, hernia, or mass and BS+.  Rectal:  Patti [place], [place] present.  Mod large anal tags Moderate length anal canal, non-tender and no mass  Anoscopy:  RA internal hemorrhoid, Gr 1 Otherwise negative   Lymph:  no cervical or supraclavicular adenopathy. Extremities:   no edema Skin   no rash. Neuro:  A&O x 3.  Psych:  appropriate mood and  Affect.   Data Reviewed: Prior colonoscopy and GI notes 2008 colonoscopy - hemorrhoids o/w NL     Assessment & Plan:  Chronic constipation  hemorrhoids  Anal skin tags  Continue current care See me as needed Colonoscopy for screening 2018  [id number] Moshe Cipro, MD

## 2014-05-13 NOTE — Patient Instructions (Addendum)
Follow up with Dr. Carlean Purl as needed.  You may use preparation H suppositories as needed.    I appreciate the opportunity to care for you.

## 2014-05-19 ENCOUNTER — Encounter (HOSPITAL_COMMUNITY): Payer: Self-pay | Admitting: *Deleted

## 2014-05-19 ENCOUNTER — Ambulatory Visit (HOSPITAL_COMMUNITY)
Admission: RE | Admit: 2014-05-19 | Discharge: 2014-05-19 | Disposition: A | Discharge status: Home / Self Care | Payer: Medicare Other | Source: Ambulatory Visit | Attending: Family Medicine | Admitting: Family Medicine

## 2014-05-19 ENCOUNTER — Telehealth: Payer: Self-pay | Admitting: Family Medicine

## 2014-05-19 ENCOUNTER — Telehealth: Payer: Self-pay

## 2014-05-19 ENCOUNTER — Emergency Department (HOSPITAL_COMMUNITY)
Admission: EM | Admit: 2014-05-19 | Discharge: 2014-05-19 | Disposition: A | Discharge status: Home / Self Care | Payer: Medicare Other | Attending: Emergency Medicine | Admitting: Emergency Medicine

## 2014-05-19 DIAGNOSIS — X58XXXA Exposure to other specified factors, initial encounter: Secondary | ICD-10-CM | POA: Diagnosis not present

## 2014-05-19 DIAGNOSIS — R0789 Other chest pain: Secondary | ICD-10-CM | POA: Insufficient documentation

## 2014-05-19 DIAGNOSIS — S39012A Strain of muscle, fascia and tendon of lower back, initial encounter: Secondary | ICD-10-CM | POA: Insufficient documentation

## 2014-05-19 DIAGNOSIS — Z79899 Other long term (current) drug therapy: Secondary | ICD-10-CM | POA: Diagnosis not present

## 2014-05-19 DIAGNOSIS — R0781 Pleurodynia: Secondary | ICD-10-CM

## 2014-05-19 DIAGNOSIS — Y929 Unspecified place or not applicable: Secondary | ICD-10-CM | POA: Insufficient documentation

## 2014-05-19 DIAGNOSIS — Z8619 Personal history of other infectious and parasitic diseases: Secondary | ICD-10-CM | POA: Insufficient documentation

## 2014-05-19 DIAGNOSIS — K219 Gastro-esophageal reflux disease without esophagitis: Secondary | ICD-10-CM | POA: Insufficient documentation

## 2014-05-19 DIAGNOSIS — Y93B9 Activity, other involving muscle strengthening exercises: Secondary | ICD-10-CM | POA: Diagnosis not present

## 2014-05-19 DIAGNOSIS — F329 Major depressive disorder, single episode, unspecified: Secondary | ICD-10-CM | POA: Insufficient documentation

## 2014-05-19 DIAGNOSIS — M549 Dorsalgia, unspecified: Secondary | ICD-10-CM | POA: Diagnosis present

## 2014-05-19 DIAGNOSIS — I10 Essential (primary) hypertension: Secondary | ICD-10-CM | POA: Insufficient documentation

## 2014-05-19 DIAGNOSIS — Y998 Other external cause status: Secondary | ICD-10-CM | POA: Diagnosis not present

## 2014-05-19 DIAGNOSIS — Z8669 Personal history of other diseases of the nervous system and sense organs: Secondary | ICD-10-CM | POA: Diagnosis not present

## 2014-05-19 DIAGNOSIS — S29012A Strain of muscle and tendon of back wall of thorax, initial encounter: Secondary | ICD-10-CM

## 2014-05-19 MED ORDER — METHOCARBAMOL 500 MG PO TABS: 500.0000 mg | ORAL_TABLET | Freq: Three times a day (TID) | ORAL | Status: DC

## 2014-05-19 MED ORDER — METHOCARBAMOL 500 MG PO TABS
500.0000 mg | ORAL_TABLET | Freq: Once | ORAL | Status: AC
  Administered 2014-05-19: 500 mg via ORAL
  Filled 2014-05-19: qty 1

## 2014-05-19 MED ORDER — TRAMADOL HCL 50 MG PO TABS: 50.0000 mg | ORAL_TABLET | Freq: Four times a day (QID) | ORAL | Status: DC | PRN

## 2014-05-19 NOTE — Telephone Encounter (Signed)
cxr is ordered

## 2014-05-19 NOTE — ED Provider Notes (Signed)
CSN: [id number]     Arrival date & time 05/19/14  1805 History  This chart was scribed for Kem Parkinson, PA-C with Carmin Muskrat, MD by Edison Simon, ED Scribe. This patient was seen in room APFT24/APFT24 and the patient's care was started at 7:03 PM.    Chief Complaint  Patient presents with  . Back Pain   The history is provided by the patient. No language interpreter was used.    HPI Comments: Tiffany Mendoza is a 63 y.o. female who presents to the Emergency Department complaining of left scapula area back pain with onset 4 days ago. She states she has been doing weight exercises 6-7 days a week for the past 3 weeks, including planks, pushups, and jumping jacks. She states pain was intermittent at first and increased gradually; she started using Advil 2 days ago and took 6 pills that day. She states that yesterday, she was unable to get comfortable while trying sleep despite using Clorazepate sleep aid and was able to get to sleep after using Advil. She contacted Dr. Moshe Cipro today and had a chest x-ray, but pain continued and has been constant so she decided to come to the ED. She states pain is not worse with movement or deep breath. She states that pain improved when applying a warm compress. She denies headache or dizziness, neck pain, numbness or weakness of the arms, chest pain or shortness of breath.  Past Medical History  Diagnosis Date  . Allergy   . Neck pain   . Insomnia   . Carotid bruit   . Dermatophytosis of foot   . Osteoporosis   . Adjustment disorder with mixed anxiety and depressed mood   . Depression   . Hypertension   . GERD (gastroesophageal reflux disease)    Past Surgical History  Procedure Laterality Date  . Abdominal hysterectomy  1988  . Hemorrhoid surgery    . Myoectomy     Family History  Problem Relation Age of Onset  . Colon polyps Father   . Prostate cancer Father   . Cirrhosis      grandmother   . Liver cancer Maternal Grandmother   .  Pancreatic cancer Maternal Grandfather    History  Substance Use Topics  . Smoking status: Never Smoker   . Smokeless tobacco: Not on file  . Alcohol Use: No   OB History    No data available     Review of Systems  Respiratory: Negative for cough, chest tightness and shortness of breath.   Cardiovascular: Negative for chest pain and palpitations.  Genitourinary: Negative for flank pain.  Musculoskeletal: Positive for back pain.  Skin: Negative for rash and wound.  Neurological: Negative for dizziness, weakness, numbness and headaches.  Psychiatric/Behavioral: Negative for sleep disturbance.  All other systems reviewed and are negative.     Allergies  Review of patient's allergies indicates no known allergies.  Home Medications   Prior to Admission medications   Medication Sig Start Date End Date Taking? Authorizing Provider  b complex vitamins tablet Take 1 tablet by mouth daily.    Historical Provider, MD  Cholecalciferol (VITAMIN D3) 1000 UNITS CAPS Take by mouth.      Historical Provider, MD  clorazepate (TRANXENE) 7.5 MG tablet TAKE ONE TABLET BY MOUTH TWICE DAILY AS NEEDED 11/14/13   Fayrene Helper, MD  estrogen, conjugated,-medroxyprogesterone (PREMPRO) 0.625-2.5 MG per tablet Take 1 tablet by mouth daily.    Historical Provider, MD  fish oil-omega-3 fatty acids  1000 MG capsule Take 1 g by mouth daily.    Historical Provider, MD  GARLIC PO Take by mouth.    Historical Provider, MD  hydrochlorothiazide (HYDRODIURIL) 25 MG tablet TAKE ONE TABLET BY MOUTH ONCE DAILY 02/18/14   Fayrene Helper, MD  magnesium aspartate (MAGINEX) 615 MG tablet Take 615 mg by mouth 2 (two) times daily.    Historical Provider, MD  Nutritional Supplements (NUTRITIONAL SUPPLEMENT PO) Take by mouth. Psyllium Husk    Historical Provider, MD  omeprazole (PRILOSEC OTC) 20 MG tablet Take 20 mg by mouth daily.    Historical Provider, MD  Polyethylene Glycol 3350 (MIRALAX PO) Take by mouth.       Historical Provider, MD  Potassium 99 MG TABS Take by mouth daily.      Historical Provider, MD  Probiotic Product (PROBIOTIC DAILY PO) Take by mouth.    Historical Provider, MD  ranitidine (ZANTAC) 150 MG capsule Take 150 mg by mouth every evening.    Historical Provider, MD  sertraline (ZOLOFT) 100 MG tablet Take 1 tablet (100 mg total) by mouth daily. 01/30/14   Fayrene Helper, MD  vitamin C (ASCORBIC ACID) 500 MG tablet Take 500 mg by mouth daily.    Historical Provider, MD   BP 159/92 mmHg  Pulse 67  Temp(Src) 97.7 F (36.5 C) (Oral)  Resp 18  Ht 5\' 7"  (1.702 m)  Wt 178 lb (80.74 kg)  BMI 27.87 kg/m2  SpO2 97% Physical Exam  Constitutional: She is oriented to person, place, and time. She appears well-developed and well-nourished.  HENT:  Head: Normocephalic and atraumatic.  Eyes: Conjunctivae are normal.  Neck: Normal range of motion, full passive range of motion without pain and phonation normal. Neck supple.  Cardiovascular: Normal rate, regular rhythm, normal heart sounds and intact distal pulses.   No murmur heard. Pulmonary/Chest: Effort normal and breath sounds normal. No respiratory distress. She has no wheezes. She has no rales. She exhibits no tenderness.  Musculoskeletal: Normal range of motion.  Localized tenderness along left scapular border No spinal tednerness or edema Distal sensation, radial pulse intact cap refill <2 seconds 5/5 strength against resistance of bilateral UE's  Neurological: She is alert and oriented to person, place, and time.  Skin: Skin is warm and dry.  Psychiatric: She has a normal mood and affect.  Nursing note and vitals reviewed.   ED Course  Procedures (including critical care time)  DIAGNOSTIC STUDIES: Oxygen Saturation is 97% on room air, normal by my interpretation.    COORDINATION OF CARE: 7:18 PM Discussed treatment plan with patient at beside, including muscle relaxant and pain medication. The patient agrees with the  plan and has no further questions at this time.   Labs Review Labs Reviewed - No data to display  Imaging Review No results found.   EKG Interpretation None      MDM   Final diagnoses:  Muscle strain of left upper back, initial encounter    Pt with likely muscle strain after exercising.  No concerning sx for cardiac or neurological  process.  NV intact.  She agrees to rest, ice and symptomatic treatment with ultram and robaxin   I personally performed the services described in this documentation, which was scribed in my presence. The recorded information has been reviewed and is accurate.    Reonna Finlayson L. Vanessa Watts, PA-C 05/21/14 [place], MD 05/21/14 [phone]

## 2014-05-19 NOTE — Telephone Encounter (Signed)
I spoke directly with pt, she had told the radiologist she is not leaving the x ray de[pt unless she got medication for pain I advised her to go to the ED to be evaluated as she needs a clinician to determine and treat the cause of such severe debilitating pain, she agreed to go

## 2014-05-19 NOTE — Telephone Encounter (Signed)
States x 1 week she has been hurting under her left arm, in her rib area and around to her left shoulder blade. Getting worse since Saturday. Rates pain at a 9 (throbbing) Has been taking Advil without much relief. Has been exercising for the past 2 weeks (sit ups and planks) doesn't know if this is the cause. Told her I would call her with response but she wants to wait here incase you order xray she can go on now. Please advise

## 2014-05-19 NOTE — ED Notes (Signed)
Pain lt scapular region,  Turned her head on Thursday and felt onset  Of pain.  Had chest xray done as OP per order Dr Moshe Cipro  No known injury

## 2014-05-19 NOTE — Discharge Instructions (Signed)

## 2014-05-19 NOTE — ED Notes (Signed)
Pt has been doing weight bearing exercises for 3 weeks. States pain to left upper back. States pain was better with heat therapy last night. Has seen Dr. Moshe Cipro for same.

## 2014-05-20 ENCOUNTER — Emergency Department (HOSPITAL_COMMUNITY)
Admission: EM | Admit: 2014-05-20 | Discharge: 2014-05-20 | Disposition: A | Discharge status: Home / Self Care | Payer: Medicare Other | Attending: Emergency Medicine | Admitting: Emergency Medicine

## 2014-05-20 ENCOUNTER — Encounter (HOSPITAL_COMMUNITY): Payer: Self-pay | Admitting: Emergency Medicine

## 2014-05-20 DIAGNOSIS — Z8619 Personal history of other infectious and parasitic diseases: Secondary | ICD-10-CM | POA: Insufficient documentation

## 2014-05-20 DIAGNOSIS — Z23 Encounter for immunization: Secondary | ICD-10-CM | POA: Insufficient documentation

## 2014-05-20 DIAGNOSIS — K219 Gastro-esophageal reflux disease without esophagitis: Secondary | ICD-10-CM | POA: Diagnosis not present

## 2014-05-20 DIAGNOSIS — Z79899 Other long term (current) drug therapy: Secondary | ICD-10-CM | POA: Insufficient documentation

## 2014-05-20 DIAGNOSIS — I10 Essential (primary) hypertension: Secondary | ICD-10-CM | POA: Insufficient documentation

## 2014-05-20 DIAGNOSIS — F329 Major depressive disorder, single episode, unspecified: Secondary | ICD-10-CM | POA: Diagnosis not present

## 2014-05-20 DIAGNOSIS — M5412 Radiculopathy, cervical region: Secondary | ICD-10-CM | POA: Diagnosis not present

## 2014-05-20 DIAGNOSIS — M792 Neuralgia and neuritis, unspecified: Secondary | ICD-10-CM

## 2014-05-20 DIAGNOSIS — R200 Anesthesia of skin: Secondary | ICD-10-CM | POA: Diagnosis present

## 2014-05-20 MED ORDER — PREDNISONE 10 MG PO TABS: 20.0000 mg | ORAL_TABLET | Freq: Two times a day (BID) | ORAL | Status: DC

## 2014-05-20 NOTE — Discharge Instructions (Signed)
Continue tramadol and Flexeril as prescribed.  Start taking prednisone as prescribed.  Follow-up with your primary Dr. if not improving in the next week to discuss additional medications or treatment options.   Neuropathic Pain We often think that pain has a physical cause. If we get rid of the cause, the pain should go away. Nerves themselves can also cause pain. It is called neuropathic pain, which means nerve abnormality. It may be difficult for the patients who have it and for the treating caregivers. Pain is usually described as acute (short-lived) or chronic (long-lasting). Acute pain is related to the physical sensations caused by an injury. It can last from a few seconds to many weeks, but it usually goes away when normal healing occurs. Chronic pain lasts beyond the typical healing time. With neuropathic pain, the nerve fibers themselves may be damaged or injured. They then send incorrect signals to other pain centers. The pain you feel is real, but the cause is not easy to find.  CAUSES  Chronic pain can result from diseases, such as diabetes and shingles (an infection related to chickenpox), or from trauma, surgery, or amputation. It can also happen without any known injury or disease. The nerves are sending pain messages, even though there is no identifiable cause for such messages.   Other common causes of neuropathy include diabetes, phantom limb pain, or Regional Pain Syndrome (RPS).  As with all forms of chronic back pain, if neuropathy is not correctly treated, there can be a number of associated problems that lead to a downward cycle for the patient. These include depression, sleeplessness, feelings of fear and anxiety, limited social interaction and inability to do normal daily activities or work.  The most dramatic and mysterious example of neuropathic pain is called "phantom limb syndrome." This occurs when an arm or a leg has been removed because of illness or injury. The brain  still gets pain messages from the nerves that originally carried impulses from the missing limb. These nerves now seem to misfire and cause troubling pain.  Neuropathic pain often seems to have no cause. It responds poorly to standard pain treatment. Neuropathic pain can occur after:  Shingles (herpes zoster virus infection).  A lasting burning sensation of the skin, caused usually by injury to a peripheral nerve.  Peripheral neuropathy which is widespread nerve damage, often caused by diabetes or alcoholism.  Phantom limb pain following an amputation.  Facial nerve problems (trigeminal neuralgia).  Multiple sclerosis.  Reflex sympathetic dystrophy.  Pain which comes with cancer and cancer chemotherapy.  Entrapment neuropathy such as when pressure is put on a nerve such as in carpal tunnel syndrome.  Back, leg, and hip problems (sciatica).  Spine or back surgery.  HIV Infection or AIDS where nerves are infected by viruses. Your caregiver can explain items in the above list which may apply to you. SYMPTOMS  Characteristics of neuropathic pain are:  Severe, sharp, electric shock-like, shooting, lightening-like, knife-like.  Pins and needles sensation.  Deep burning, deep cold, or deep ache.  Persistent numbness, tingling, or weakness.  Pain resulting from light touch or other stimulus that would not usually cause pain.  Increased sensitivity to something that would normally cause pain, such as a pinprick. Pain may persist for months or years following the healing of damaged tissues. When this happens, pain signals no longer sound an alarm about current injuries or injuries about to happen. Instead, the alarm system itself is not working correctly.  Neuropathic pain may get  worse instead of better over time. For some people, it can lead to serious disability. It is important to be aware that severe injury in a limb can occur without a proper, protective pain response.Burns,  cuts, and other injuries may go unnoticed. Without proper treatment, these injuries can become infected or lead to further disability. Take any injury seriously, and consult your caregiver for treatment. DIAGNOSIS  When you have a pain with no known cause, your caregiver will probably ask some specific questions:   Do you have any other conditions, such as diabetes, shingles, multiple sclerosis, or HIV infection?  How would you describe your pain? (Neuropathic pain is often described as shooting, stabbing, burning, or searing.)  Is your pain worse at any time of the day? (Neuropathic pain is usually worse at night.)  Does the pain seem to follow a certain physical pathway?  Does the pain come from an area that has missing or injured nerves? (An example would be phantom limb pain.)  Is the pain triggered by minor things such as rubbing against the sheets at night? These questions often help define the type of pain involved. Once your caregiver knows what is happening, treatment can begin. Anticonvulsant, antidepressant drugs, and various pain relievers seem to work in some cases. If another condition, such as diabetes is involved, better management of that disorder may relieve the neuropathic pain.  TREATMENT  Neuropathic pain is frequently long-lasting and tends not to respond to treatment with narcotic type pain medication. It may respond well to other drugs such as antiseizure and antidepressant medications. Usually, neuropathic problems do not completely go away, but partial improvement is often possible with proper treatment. Your caregivers have large numbers of medications available to treat you. Do not be discouraged if you do not get immediate relief. Sometimes different medications or a combination of medications will be tried before you receive the results you are hoping for. See your caregiver if you have pain that seems to be coming from nowhere and does not go away. Help is available.    SEEK IMMEDIATE MEDICAL CARE IF:   There is a sudden change in the quality of your pain, especially if the change is on only one side of the body.  You notice changes of the skin, such as redness, black or purple discoloration, swelling, or an ulcer.  You cannot move the affected limbs. Document Released: 03/03/2004 Document Revised: 08/29/2011 Document Reviewed: 03/03/2004 [facility] Patient Information 2015 [place], [place]. This information is not intended to replace advice given to you by your health care provider. Make sure you discuss any questions you have with your health care provider.

## 2014-05-20 NOTE — Assessment & Plan Note (Signed)
Vaccine administered at visit.  

## 2014-05-20 NOTE — ED Notes (Signed)
Patient reports seen in ED yesterday and diagnosed with muscle strain of left upper back. Reports she noticed last night that her lower left arm is numb.

## 2014-05-20 NOTE — Assessment & Plan Note (Signed)
Sleep hygiene reviewed and written information offered also. Prescription sent for  medication needed.  

## 2014-05-20 NOTE — Assessment & Plan Note (Signed)
Patient educated about the importance of limiting  Carbohydrate intake , the need to commit to daily physical activity for a minimum of 30 minutes , and to commit weight loss. The fact that changes in all these areas will reduce or eliminate all together the development of diabetes is stressed.   Updated lab needed at/ before next visit.  

## 2014-05-20 NOTE — ED Notes (Signed)
Pt alert & oriented x4, stable gait. Patient given discharge instructions, paperwork & prescription(s). Patient  instructed to stop at the registration desk to finish any additional paperwork. Patient verbalized understanding. Pt left department w/ no further questions. 

## 2014-05-20 NOTE — Progress Notes (Signed)
Subjective:    Patient ID: Tiffany Mendoza, female    DOB: 10-07-1950, 63 y.o.   MRN: [id number]  HPI The PT is here for follow up and re-evaluation of chronic medical conditions, medication management and review of any available recent lab and radiology data.  Preventive health is updated, specifically  Cancer screening and Immunization.  Need for flu vaccine Questions or concerns regarding consultations or procedures which the PT has had in the interim are  Addressed.Has not followed through on referral to mental health, will benefit from therapy but not yet willing to go. Not suicidal or homicidal, but has a lot of conflict at home with her aged Mother who sh cares for The PT denies any adverse reactions to current medications since the last visit.  C/o ringing in the ears and wants ENT eval for this, denies hearing loss. 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 Denies sinus pressure, nasal congestion, ear pain or sore throat. Denies chest congestion, productive cough or wheezing. Denies chest pains, palpitations and leg swelling Denies abdominal pain, nausea, vomiting,diarrhea or constipation.   Denies dysuria, frequency, hesitancy or incontinence. Denies joint pain, swelling and limitation in mobility. Denies headaches, seizures, numbness, or tingling. Denies uncontrolled depression, anxiety or insomnia.not suicidal or homicidal Denies skin break down or rash.        Objective:   Physical Exam BP 126/72 mmHg  Pulse 74  Resp 18  Ht 5\' 8"  (1.727 m)  Wt 177 lb 1.9 oz (80.341 kg)  BMI 26.94 kg/m2  SpO2 96% Patient alert and oriented and in no cardiopulmonary distress.  HEENT: No facial asymmetry, EOMI,   oropharynx pink and moist.  Neck supple no JVD, no mass. TM clear bilaterally good light reflex Chest: Clear to  auscultation bilaterally.  CVS: S1, S2 no murmurs, no S3.Regular rate.  ABD: Soft non tender.   Ext: No edema  MS: Adequate ROM spine, shoulders, hips and knees.  Skin: Intact, no ulcerations or rash noted.  Psych: Good eye contact, normal affect. Memory intact not anxious or depressed appearing.  CNS: CN 2-12 intact, power,  normal throughout.no focal deficits noted.        Assessment & Plan:  Essential hypertension Controlled, no change in medication DASH diet and commitment to daily physical activity for a minimum of 30 minutes discussed and encouraged, as a part of hypertension management. The importance of attaining a healthy weight is also discussed.   IFG (impaired fasting glucose) Patient educated about the importance of limiting  Carbohydrate intake , the need to commit to daily physical activity for a minimum of 30 minutes , and to commit weight loss. The fact that changes in all these areas will reduce or eliminate all together the development of diabetes is stressed.   Updated lab needed at/ before next visit.   Depression with anxiety Inadequate control, though poses no threat to herself or to anyone. Needs the benefit of therapy and maybe even psychiatry, resists being referred, states sh has seen psychiatry in the apst and does not feel as though she would benefit from  Mental health service at this time  Insomnia secondary to depression with anxiety Sleep hygiene reviewed and written information offered also. Prescription sent for  medication needed.   Tinnitus Ear exam is normal refer to ENT  for further eval and management . Med review reveals no ototoxic meds   Need for prophylactic vaccination and inoculation against influenza Vaccine administered at visit.

## 2014-05-20 NOTE — ED Notes (Signed)
Pt w/ good movement & strength in all extremities. Pt complaining of a numbness to touch on the left forearm. No other symptoms noted.

## 2014-05-20 NOTE — ED Provider Notes (Signed)
CSN: [id number]     Arrival date & time 05/20/14  2111 History  This chart was scribed for Tiffany Speak, MD by Delphia Grates, ED Scribe. This patient was seen in room APA10/[facility]10 and the patient's care was started at 9:45 PM.     Chief Complaint  Patient presents with  . Numbness    The history is provided by the patient. No language interpreter was used.     HPI Comments: Tiffany Mendoza is a 63 y.o. female who presents to the Emergency Department complaining of numbness to the left forearm onset last night. Patient states she was here last night due to pain in the left scapular region that developed after exercising for the last 3 weeks. Patient was prescribed a muscle relaxer, however, she reports upon taking the medication, she developed numbness in her left forearm.  She reports taking several other prescribed medications (potassium, Sertraline, and hydrochlorothiazide)  with the muscle relaxer and suspects this is may be related. Patient is also concerned she may be having a unique side effect to the medication. She denies any other numbness or pain.  Past Medical History  Diagnosis Date  . Allergy   . Neck pain   . Insomnia   . Carotid bruit   . Dermatophytosis of foot   . Osteoporosis   . Adjustment disorder with mixed anxiety and depressed mood   . Depression   . Hypertension   . GERD (gastroesophageal reflux disease)    Past Surgical History  Procedure Laterality Date  . Abdominal hysterectomy  1988  . Hemorrhoid surgery    . Myoectomy     Family History  Problem Relation Age of Onset  . Colon polyps Father   . Prostate cancer Father   . Cirrhosis      grandmother   . Liver cancer Maternal Grandmother   . Pancreatic cancer Maternal Grandfather    History  Substance Use Topics  . Smoking status: Never Smoker   . Smokeless tobacco: Not on file  . Alcohol Use: No   OB History    No data available     Review of Systems  A complete 10 system review of  systems was obtained and all systems are negative except as noted in the HPI and PMH.    Allergies  Review of patient's allergies indicates no known allergies.  Home Medications   Prior to Admission medications   Medication Sig Start Date End Date Taking? Authorizing Provider  b complex vitamins tablet Take 1 tablet by mouth daily.    Historical Provider, MD  Cholecalciferol (VITAMIN D3) 1000 UNITS CAPS Take by mouth.      Historical Provider, MD  clorazepate (TRANXENE) 7.5 MG tablet TAKE ONE TABLET BY MOUTH TWICE DAILY AS NEEDED 11/14/13   Fayrene Helper, MD  estrogen, conjugated,-medroxyprogesterone (PREMPRO) 0.625-2.5 MG per tablet Take 1 tablet by mouth daily.    Historical Provider, MD  fish oil-omega-3 fatty acids 1000 MG capsule Take 1 g by mouth daily.    Historical Provider, MD  GARLIC PO Take by mouth.    Historical Provider, MD  hydrochlorothiazide (HYDRODIURIL) 25 MG tablet TAKE ONE TABLET BY MOUTH ONCE DAILY 02/18/14   Fayrene Helper, MD  magnesium aspartate (MAGINEX) 615 MG tablet Take 615 mg by mouth 2 (two) times daily.    Historical Provider, MD  methocarbamol (ROBAXIN) 500 MG tablet Take 1 tablet (500 mg total) by mouth 3 (three) times daily. 05/19/14   Tammy L. Triplett,  PA-C  Nutritional Supplements (NUTRITIONAL SUPPLEMENT PO) Take by mouth. Psyllium Husk    Historical Provider, MD  omeprazole (PRILOSEC OTC) 20 MG tablet Take 20 mg by mouth daily.    Historical Provider, MD  Polyethylene Glycol 3350 (MIRALAX PO) Take by mouth.      Historical Provider, MD  Potassium 99 MG TABS Take by mouth daily.      Historical Provider, MD  Probiotic Product (PROBIOTIC DAILY PO) Take by mouth.    Historical Provider, MD  ranitidine (ZANTAC) 150 MG capsule Take 150 mg by mouth every evening.    Historical Provider, MD  sertraline (ZOLOFT) 100 MG tablet Take 1 tablet (100 mg total) by mouth daily. 01/30/14   Fayrene Helper, MD  traMADol (ULTRAM) 50 MG tablet Take 1 tablet (50 mg  total) by mouth every 6 (six) hours as needed. 05/19/14   Tammy L. Triplett, PA-C  vitamin C (ASCORBIC ACID) 500 MG tablet Take 500 mg by mouth daily.    Historical Provider, MD   Triage Vitals: BP 146/93 mmHg  Pulse 65  Temp(Src) 97.9 F (36.6 C) (Oral)  Resp 18  Ht 5\' 6"  (1.676 m)  Wt 178 lb (80.74 kg)  BMI 28.74 kg/m2  SpO2 99%  Physical Exam  Constitutional: She is oriented to person, place, and time. She appears well-developed and well-nourished. No distress.  HENT:  Head: Normocephalic and atraumatic.  Eyes: Conjunctivae and EOM are normal.  Neck: Neck supple. No tracheal deviation present.  Cardiovascular: Normal rate.   Pulmonary/Chest: Effort normal. No respiratory distress.  Musculoskeletal: Normal range of motion.  The bilateral upper extremities are noted to have 5/5 strength. Ulnar and radial pulses are palpable bilaterally. Sensory is intact trhoughout.  Neurological: She is alert and oriented to person, place, and time.  Skin: Skin is warm and dry.  Psychiatric: She has a normal mood and affect. Her behavior is normal.  Nursing note and vitals reviewed.   ED Course  Procedures (including critical care time)  DIAGNOSTIC STUDIES: Oxygen Saturation is 99% on room air, normal by my interpretation.    COORDINATION OF CARE: At 2155 Discussed treatment plan with patient. Patient agrees.   Labs Review Labs Reviewed - No data to display  Imaging Review Dg Chest 2 View  05/20/2014   CLINICAL DATA:  Left sided rib cage pain after exercisinglast week  EXAM: CHEST  2 VIEW  COMPARISON:  None.  FINDINGS: The lungs are mildly hyperinflated with hemidiaphragm flattening. There is no focal infiltrate, pleural effusion, or pneumothorax. The heart and pulmonary vascularity are within the limits of normal. The bony thorax exhibits no acute abnormality. Specific attention to the visualized left ribs reveals no acute abnormality.  IMPRESSION: Mild hyperinflation consistent with  COPD or reactive airway disease. There is no evidence of pneumonia nor other acute cardiopulmonary abnormality. The observed portions of the bony thorax are unremarkable.  A left rib detail series may be useful if the patient has focal symptoms.   Electronically Signed   By: David  Martinique   On: 05/20/2014 08:33     EKG Interpretation None      MDM   Final diagnoses:  None    Patient presents with numbness of the left forearm. She was seen yesterday for pain in her left shoulder. Her symptoms appear radicular in nature. I will add prednisone to the treatment regimen and advise her to follow-up with her primary Dr. if not improving in the next week. Strength, motor, and pulses  are symmetrical in both upper extremities and I see no red flags which would indicate an emergent situation.  I personally performed the services described in this documentation, which was scribed in my presence. The recorded information has been reviewed and is accurate.     Tiffany Speak, MD 05/20/14 2231

## 2014-05-20 NOTE — Assessment & Plan Note (Signed)
Inadequate control, though poses no threat to herself or to anyone. Needs the benefit of therapy and maybe even psychiatry, resists being referred, states sh has seen psychiatry in the apst and does not feel as though she would benefit from  Mental health service at this time

## 2014-05-20 NOTE — Assessment & Plan Note (Signed)
Ear exam is normal refer to ENT for further eval and management . Med review reveals no ototoxic meds

## 2014-05-20 NOTE — Assessment & Plan Note (Signed)
Controlled, no change in medication DASH diet and commitment to daily physical activity for a minimum of 30 minutes discussed and encouraged, as a part of hypertension management. The importance of attaining a healthy weight is also discussed.  

## 2014-05-22 ENCOUNTER — Encounter: Payer: Self-pay | Admitting: Family Medicine

## 2014-05-22 ENCOUNTER — Ambulatory Visit (INDEPENDENT_AMBULATORY_CARE_PROVIDER_SITE_OTHER): Payer: Medicare Other | Admitting: Family Medicine

## 2014-05-22 VITALS — BP 122/82 | HR 86 | Resp 16 | Ht 68.0 in | Wt 179.0 lb

## 2014-05-22 DIAGNOSIS — M546 Pain in thoracic spine: Secondary | ICD-10-CM | POA: Insufficient documentation

## 2014-05-22 DIAGNOSIS — I10 Essential (primary) hypertension: Secondary | ICD-10-CM

## 2014-05-22 MED ORDER — IBUPROFEN 600 MG PO TABS: ORAL_TABLET | ORAL | Status: DC

## 2014-05-22 NOTE — Progress Notes (Signed)
   Subjective:    Patient ID: Tiffany Mendoza, female    DOB: 04-24-1951, 63 y.o.   MRN: [id number]  HPI Pt in today still having significant upper back pain radiating to left forearm associated with left hand weakness today, feels as though she injured herself while doing exercises last week Has only filled tramadol and flexeril,needs to fill prednisone diose pack Denies weakness in hand, has had no simmilar symptoms in the past  Review of Systems See HPI Denies recent fever or chills. Denies sinus pressure, nasal congestion, ear pain or sore throat. Denies chest congestion, productive cough or wheezing. Denies anterior  chest pains, palpitations and leg swelling, denies PND or orthopnea Denies abdominal pain, nausea, vomiting,diarrhea or constipation.    Denies headaches, seizures,  Denies uncontrolled depression, anxiety or insomnia. Denies skin break down or rash.        Objective:   Physical Exam BP 122/82 mmHg  Pulse 86  Resp 16  Ht 5\' 8"  (1.727 m)  Wt 179 lb (81.194 kg)  BMI 27.22 kg/m2  SpO2 97% Patient alert and oriented and in no cardiopulmonary distress.Pt in mild to moderate pain  HEENT: No facial asymmetry, EOMI,   oropharynx pink and moist.  Neck supple no JVD, no mass.  Chest: Clear to auscultation bilaterally.Tender over posterior left chest, upper and middle third, no erythema or warmth or visiuble skin breakdown in area concerned  CVS: S1, S2 no murmurs, no S3.Regular rate.  ABD: Soft non tender.   Ext: No edema  MS: Adequate ROM spine, shoulders, hips and knees.  Skin: Intact, no ulcerations or rash noted.  Psych: Good eye contact, normal affect. Memory intact not anxious or depressed appearing.  CNS: CN 2-12 intact, power,  normal throughout.no focal deficits noted.        Assessment & Plan:  Left-sided thoracic back pain Uncontrolled, pt needs to fill prediosone, s ibuprofen prescribed with warning re excessive use. Muscle relaxant  also prescribed Xray shows no bony abnormality, but s/s consistent withy nerve pain likely traumatized during recent exercise May benefit from Pt , wills see how things evolve, may also need orhto input depending on severity and duration of symptoms  Essential hypertension Controlled, no change in medication

## 2014-05-22 NOTE — Patient Instructions (Signed)
F/u as before  You are experiencing pain from nerve irritation in your uppper back  Take prednisone as prescribed  Take ibuprofen as prescribed  Take Zantac 150 mg one twice daily for 2 weeks  Only use tramadol and cyclobenzaprine for uncontrolled pain or back spasm and preferably only at night as they are both sedating

## 2014-05-28 ENCOUNTER — Other Ambulatory Visit: Payer: Self-pay

## 2014-05-28 ENCOUNTER — Telehealth: Payer: Self-pay

## 2014-05-28 MED ORDER — IBUPROFEN 800 MG PO TABS: 800.0000 mg | ORAL_TABLET | Freq: Every day | ORAL | Status: DC | PRN

## 2014-05-28 NOTE — Telephone Encounter (Signed)
Patient aware and med sent to pharmacy.  Will call back if she would like to be referred to pt

## 2014-05-28 NOTE — Telephone Encounter (Signed)
When is gh her appt with chiropracter, does she want to be referred to PT instead, iof so pls do twice weekly for 4 weeks What is she requesting ? I ibprofen 800mg  , I recommen once daily #30 only and also tylenol 500mg  2 daily  ??pls ask

## 2014-06-01 ENCOUNTER — Telehealth: Payer: Self-pay | Admitting: Family Medicine

## 2014-06-01 ENCOUNTER — Other Ambulatory Visit: Payer: Self-pay | Admitting: Family Medicine

## 2014-06-01 NOTE — Telephone Encounter (Signed)
Called in with question about whether she can take additional ibuprofen 800 mg tab today, has already taken one and ibuprofen 800 mg, also has taken 2 alleve today, I  advised her to only to take tylenol for the rest of day, and states she will keep appt with chiropracter in am before decifding if she will need orhto, convinmced she injured herself doing exercise  Pls refill flexeril x 2 thanks

## 2014-06-01 NOTE — Assessment & Plan Note (Signed)
Uncontrolled, pt needs to fill prediosone, s ibuprofen prescribed with warning re excessive use. Muscle relaxant also prescribed Xray shows no bony abnormality, but s/s consistent withy nerve pain likely traumatized during recent exercise May benefit from Pt , wills see how things evolve, may also need orhto input depending on severity and duration of symptoms

## 2014-06-01 NOTE — Assessment & Plan Note (Signed)
Controlled, no change in medication  

## 2014-06-02 ENCOUNTER — Other Ambulatory Visit: Payer: Self-pay

## 2014-06-02 MED ORDER — CYCLOBENZAPRINE HCL 5 MG PO TABS: 5.0000 mg | ORAL_TABLET | Freq: Three times a day (TID) | ORAL | Status: DC | PRN

## 2014-06-02 NOTE — Telephone Encounter (Signed)
refilled 

## 2014-06-26 ENCOUNTER — Other Ambulatory Visit: Payer: Self-pay | Admitting: Family Medicine

## 2014-07-19 ENCOUNTER — Other Ambulatory Visit: Payer: Self-pay | Admitting: Family Medicine

## 2014-07-29 ENCOUNTER — Ambulatory Visit: Payer: Medicare Other | Admitting: Family Medicine

## 2014-08-07 ENCOUNTER — Encounter: Payer: Self-pay | Admitting: Family Medicine

## 2014-08-07 ENCOUNTER — Ambulatory Visit (INDEPENDENT_AMBULATORY_CARE_PROVIDER_SITE_OTHER): Payer: Medicare Other | Admitting: Family Medicine

## 2014-08-07 VITALS — BP 120/78 | HR 83 | Resp 16 | Ht 68.0 in | Wt 176.0 lb

## 2014-08-07 DIAGNOSIS — I10 Essential (primary) hypertension: Secondary | ICD-10-CM | POA: Diagnosis not present

## 2014-08-07 DIAGNOSIS — R0789 Other chest pain: Secondary | ICD-10-CM | POA: Insufficient documentation

## 2014-08-07 DIAGNOSIS — F5105 Insomnia due to other mental disorder: Secondary | ICD-10-CM

## 2014-08-07 DIAGNOSIS — E559 Vitamin D deficiency, unspecified: Secondary | ICD-10-CM

## 2014-08-07 DIAGNOSIS — F4323 Adjustment disorder with mixed anxiety and depressed mood: Secondary | ICD-10-CM

## 2014-08-07 DIAGNOSIS — F341 Dysthymic disorder: Secondary | ICD-10-CM

## 2014-08-07 DIAGNOSIS — E785 Hyperlipidemia, unspecified: Secondary | ICD-10-CM

## 2014-08-07 DIAGNOSIS — Z79899 Other long term (current) drug therapy: Secondary | ICD-10-CM | POA: Diagnosis not present

## 2014-08-07 DIAGNOSIS — F418 Other specified anxiety disorders: Secondary | ICD-10-CM

## 2014-08-07 DIAGNOSIS — R7301 Impaired fasting glucose: Secondary | ICD-10-CM | POA: Diagnosis not present

## 2014-08-07 DIAGNOSIS — K5909 Other constipation: Secondary | ICD-10-CM

## 2014-08-07 DIAGNOSIS — K219 Gastro-esophageal reflux disease without esophagitis: Secondary | ICD-10-CM

## 2014-08-07 NOTE — Progress Notes (Signed)
Subjective:    Patient ID: Tiffany Mendoza, female    DOB: 07/09/1950, 64 y.o.   MRN: [id number]  HPI   Tiffany Mendoza     MRN: [id number]      DOB: May 14, 1951   HPI Tiffany Mendoza is here for follow up and re-evaluation of chronic medical conditions, medication management and review of any available recent lab and radiology data.  Preventive health is updated, specifically  Cancer screening and Immunization.    The PT denies any adverse reactions to current medications since the last visit.  2 week h/o intermittent left chest pain, no specific aggravating or relieving factors noted, non radiating, Denies any associated nausea, diaphoresis or light headedness.   ROS Denies recent fever or chills. Denies sinus pressure, nasal congestion, ear pain or sore throat. Denies chest congestion, productive cough or wheezing.  Denies abdominal pain, nausea, vomiting,diarrhea or constipation.   Denies dysuria, frequency, hesitancy or incontinence. Denies joint pain, swelling and limitation in mobility. Denies headaches, seizures, numbness, or tingling. Denies uncontrolled  depression, anxiety or insomnia.Wants to taper off of zoloft and wishes advice how to safely do this. Does not feel that she needs this anymore. Denies skin break down or rash.   PE  BP 120/78 mmHg  Pulse 83  Resp 16  Ht 5\' 8"  (1.727 m)  Wt 176 lb (79.833 kg)  BMI 26.77 kg/m2  SpO2 97%  Patient alert and oriented and in no cardiopulmonary distress.  HEENT: No facial asymmetry, EOMI,   oropharynx pink and moist.  Neck supple no JVD, no mass.  Chest: Clear to auscultation bilaterally.No reproducible chest wall tenderness  CVS: S1, S2 no murmurs, no S3.Regular rate.  ABD: Soft non tender.   Ext: No edema  MS: Adequate ROM spine, shoulders, hips and knees.  Skin: Intact, no ulcerations or rash noted.  Psych: Good eye contact, normal affect. Memory intact not anxious or depressed appearing.  CNS: CN 2-12  intact, power,  normal throughout.no focal deficits noted.   Assessment & Plan   Essential hypertension Controlled, no change in medication DASH diet and commitment to daily physical activity for a minimum of 30 minutes discussed and encouraged, as a part of hypertension management. The importance of attaining a healthy weight is also discussed.  BP/Weight 08/07/2014 05/22/2014 05/20/2014 05/19/2014 05/13/2014 03/18/2014 02/05/2992  Systolic BP 716 967 893 810 175 102 585  Diastolic BP 78 82 93 92 70 72 82  Wt. (Lbs) 176 179 178 178 178.2 177.12 177.12  BMI 26.77 27.22 28.74 27.87 28.78 26.94 26.94         GERD Improved, uses medication on as needed basis only   Constipation Improved, and best controlled with diet. Uses fiber supplement as needed only   ADJ DISORDER WITH MIXED ANXIETY & DEPRESSED MOOD Improved Continue current medication   Depression with anxiety Reports marked im[provement and wishes to taper off medication, advised her in writing how to discontinue medication safely. Alo advised that should she determine that she needs to resume medication she is to call in for this   Chest discomfort C/o intermittent left chest pain, non specific in character. EKG in office today shows no ischemia, NSR and no LVH   Insomnia secondary to depression with anxiety Sleep hygiene reviewed and written information offered also. Prescription sent for  medication needed.    Dyslipidemia Hyperlipidemia:Low fat diet discussed and encouraged.   Lipid Panel  Lab Results  Component Value Date   CHOL 169 09/25/2014  HDL 50 09/25/2014   LDLCALC 100* 09/25/2014   TRIG 96 09/25/2014   CHOLHDL 3.4 09/25/2014               Review of Systems     Objective:   Physical Exam        Assessment & Plan:

## 2014-08-07 NOTE — Patient Instructions (Addendum)
Annual welllness initial in 4 month, call if you need me before  EKG today   Reduce zoloft , take on e tablet ever Monday, Wednesday , Friday and Sunday for the next 3 weeks, Starting the week of March 14 take zoloft 100 mg evry Monday, Wednesday and  Friday ONLY for 2 weeks, then starting the week of   March 28 take zoloft 100mg  Monday and Thursday only for 2 more weeks then STOP zoloft April 8, the last day to take it is April 7.  Pls bring all meds to visit    Fasting lipid, cmp, TSH vit D and CBC first week in April  You  Do need the zostavax  OK to get 2nd pneumonia vaccine  First week i n April , make arrangement with nurse for this

## 2014-08-18 ENCOUNTER — Encounter: Payer: Self-pay | Admitting: Family Medicine

## 2014-08-28 ENCOUNTER — Other Ambulatory Visit: Payer: Self-pay | Admitting: Family Medicine

## 2014-09-26 LAB — TSH: TSH: 2.345 u[IU]/mL (ref 0.350–4.500)

## 2014-09-26 LAB — COMPREHENSIVE METABOLIC PANEL
ALT: 11 U/L (ref 0–35)
AST: 15 U/L (ref 0–37)
Albumin: 4.1 g/dL (ref 3.5–5.2)
Alkaline Phosphatase: 43 U/L (ref 39–117)
BUN: 10 mg/dL (ref 6–23)
CO2: 29 mEq/L (ref 19–32)
Calcium: 9.4 mg/dL (ref 8.4–10.5)
Chloride: 103 mEq/L (ref 96–112)
Creat: 0.76 mg/dL (ref 0.50–1.10)
Glucose, Bld: 80 mg/dL (ref 70–99)
Potassium: 3.8 mEq/L (ref 3.5–5.3)
Sodium: 142 mEq/L (ref 135–145)
Total Bilirubin: 0.7 mg/dL (ref 0.2–1.2)
Total Protein: 7.1 g/dL (ref 6.0–8.3)

## 2014-09-26 LAB — CBC
HCT: 39.3 % (ref 36.0–46.0)
Hemoglobin: 12.9 g/dL (ref 12.0–15.0)
MCH: 30.7 pg (ref 26.0–34.0)
MCHC: 32.8 g/dL (ref 30.0–36.0)
MCV: 93.6 fL (ref 78.0–100.0)
MPV: 10.8 fL (ref 8.6–12.4)
Platelets: 229 10*3/uL (ref 150–400)
RBC: 4.2 MIL/uL (ref 3.87–5.11)
RDW: 13.4 % (ref 11.5–15.5)
WBC: 4.5 10*3/uL (ref 4.0–10.5)

## 2014-09-26 LAB — LIPID PANEL
Cholesterol: 169 mg/dL (ref 0–200)
HDL: 50 mg/dL (ref >=46)
LDL Cholesterol: 100 mg/dL — ABNORMAL HIGH (ref 0–99)
Total CHOL/HDL Ratio: 3.4 Ratio
Triglycerides: 96 mg/dL (ref <150)
VLDL: 19 mg/dL (ref 0–40)

## 2014-09-26 LAB — VITAMIN D 25 HYDROXY (VIT D DEFICIENCY, FRACTURES): Vit D, 25-Hydroxy: 30 ng/mL (ref 30–100)

## 2014-10-26 DIAGNOSIS — E785 Hyperlipidemia, unspecified: Secondary | ICD-10-CM | POA: Insufficient documentation

## 2014-10-26 NOTE — Assessment & Plan Note (Signed)
Hyperlipidemia:Low fat diet discussed and encouraged.   Lipid Panel  Lab Results  Component Value Date   CHOL 169 09/25/2014   HDL 50 09/25/2014   LDLCALC 100* 09/25/2014   TRIG 96 09/25/2014   CHOLHDL 3.4 09/25/2014

## 2014-10-26 NOTE — Assessment & Plan Note (Signed)
Improved, and best controlled with diet. Uses fiber supplement as needed only

## 2014-10-26 NOTE — Assessment & Plan Note (Signed)
Improved.  Continue current medication. 

## 2014-10-26 NOTE — Assessment & Plan Note (Signed)
Controlled, no change in medication DASH diet and commitment to daily physical activity for a minimum of 30 minutes discussed and encouraged, as a part of hypertension management. The importance of attaining a healthy weight is also discussed.  BP/Weight 08/07/2014 05/22/2014 05/20/2014 05/19/2014 05/13/2014 03/18/2014 2/56/3893  Systolic BP 734 287 681 157 262 035 597  Diastolic BP 78 82 93 92 70 72 82  Wt. (Lbs) 176 179 178 178 178.2 177.12 177.12  BMI 26.77 27.22 28.74 27.87 28.78 26.94 26.94

## 2014-10-26 NOTE — Assessment & Plan Note (Signed)
Sleep hygiene reviewed and written information offered also. Prescription sent for  medication needed.  

## 2014-10-26 NOTE — Assessment & Plan Note (Signed)
Improved, uses medication on as needed basis only

## 2014-10-26 NOTE — Assessment & Plan Note (Signed)
C/o intermittent left chest pain, non specific in character. EKG in office today shows no ischemia, NSR and no LVH

## 2014-10-26 NOTE — Assessment & Plan Note (Addendum)
Reports marked im[provement and wishes to taper off medication, advised her in writing how to discontinue medication safely. Alo advised that should she determine that she needs to resume medication she is to call in for this

## 2014-12-15 ENCOUNTER — Other Ambulatory Visit: Payer: Self-pay

## 2014-12-24 ENCOUNTER — Encounter: Payer: Medicare Other | Admitting: Family Medicine

## 2015-01-13 ENCOUNTER — Other Ambulatory Visit: Payer: Self-pay | Admitting: Family Medicine

## 2015-03-25 ENCOUNTER — Ambulatory Visit: Payer: Medicare Other | Admitting: Family Medicine

## 2015-04-22 ENCOUNTER — Other Ambulatory Visit: Payer: Self-pay | Admitting: Family Medicine

## 2015-04-28 ENCOUNTER — Encounter: Payer: Self-pay | Admitting: Family Medicine

## 2015-04-28 ENCOUNTER — Ambulatory Visit (INDEPENDENT_AMBULATORY_CARE_PROVIDER_SITE_OTHER): Payer: Medicare Other | Admitting: Family Medicine

## 2015-04-28 VITALS — BP 138/84 | HR 84 | Resp 16 | Ht 68.0 in | Wt 184.1 lb

## 2015-04-28 DIAGNOSIS — Z1159 Encounter for screening for other viral diseases: Secondary | ICD-10-CM | POA: Diagnosis not present

## 2015-04-28 DIAGNOSIS — I10 Essential (primary) hypertension: Secondary | ICD-10-CM

## 2015-04-28 DIAGNOSIS — K219 Gastro-esophageal reflux disease without esophagitis: Secondary | ICD-10-CM

## 2015-04-28 DIAGNOSIS — R7303 Prediabetes: Secondary | ICD-10-CM

## 2015-04-28 DIAGNOSIS — F418 Other specified anxiety disorders: Secondary | ICD-10-CM

## 2015-04-28 DIAGNOSIS — F4323 Adjustment disorder with mixed anxiety and depressed mood: Secondary | ICD-10-CM

## 2015-04-28 DIAGNOSIS — E785 Hyperlipidemia, unspecified: Secondary | ICD-10-CM | POA: Diagnosis not present

## 2015-04-28 DIAGNOSIS — Z23 Encounter for immunization: Secondary | ICD-10-CM | POA: Diagnosis not present

## 2015-04-28 DIAGNOSIS — F341 Dysthymic disorder: Secondary | ICD-10-CM

## 2015-04-28 DIAGNOSIS — E663 Overweight: Secondary | ICD-10-CM

## 2015-04-28 DIAGNOSIS — R7301 Impaired fasting glucose: Secondary | ICD-10-CM

## 2015-04-28 DIAGNOSIS — F5105 Insomnia due to other mental disorder: Secondary | ICD-10-CM

## 2015-04-28 NOTE — Patient Instructions (Signed)
Annual wellness in 3.5 month, call if you need mke sooner  Fating labs   Flu vaccine today  Please work on good  health habits so that your health will improve. 1. Commitment to daily physical activity for 30 to 60  minutes, if you are able to do this.  2. Commitment to wise food choices. Aim for half of your  food intake to be vegetable and fruit, one quarter starchy foods, and one quarter protein. Try to eat on a regular schedule  3 meals per day, snacking between meals should be limited to vegetables or fruits or small portions of nuts. 64 ounces of water per day is generally recommended, unless you have specific health conditions, like heart failure or kidney failure where you will need to limit fluid intake.  3. Commitment to sufficient and a  good quality of physical and mental rest daily, generally between 6 to 8 hours per day.  WITH PERSISTANCE AND PERSEVERANCE, THE IMPOSSIBLE , BECOMES THE NORM!  Thanks for choosing [facility], we consider it a privelige to serve you.

## 2015-04-28 NOTE — Progress Notes (Signed)
Subjective:    Patient ID: Tiffany Mendoza, female    DOB: 01/08/1951, 64 y.o.   MRN: [id number]  HPI The PT is here for follow up and re-evaluation of chronic medical conditions, medication management and review of any available recent lab and radiology data.  Preventive health is updated, specifically  Cancer screening and Immunization.   Questions or concerns regarding consultations or procedures which the PT has had in the interim are  addressed. The PT denies any adverse reactions to current medications since the last visit.  In today stating that her youngest sister in [place] since Oct 15, has taken over her mother's finances, and has the health care  POA Reports that the sister in [place]'s spouse sees things more from her own  (Tiffany Mendoza's) view point Tiffany Mendoza states she has decided to put the house on sale , and she is looking for a senior's  Center for new living situation She is stressed , depressed and overwhelmed, ready for a necessary change, plans a visit with her 2 siblings , so that they can hear and understand her Mother's condition      Review of Systems See HPI Denies recent fever or chills. Denies sinus pressure, nasal congestion, ear pain or sore throat. Denies chest congestion, productive cough or wheezing. Denies chest pains, palpitations and leg swelling Denies abdominal pain, nausea, vomiting,diarrhea or constipation.   Denies dysuria, frequency, hesitancy or incontinence. Denies joint pain, swelling and limitation in mobility. Denies headaches, seizures, numbness, or tingling. Denies skin break down or rash.        Objective:   Physical Exam  BP 138/84 mmHg  Pulse 84  Resp 16  Ht 5\' 8"  (1.727 m)  Wt 184 lb 1.9 oz (83.516 kg)  BMI 28.00 kg/m2  SpO2 99% Patient alert and oriented and in no cardiopulmonary distress.  HEENT: No facial asymmetry, EOMI,   oropharynx pink and moist.  Neck supple no JVD, no mass.  Chest: Clear to auscultation  bilaterally.  CVS: S1, S2 no murmurs, no S3.Regular rate.  ABD: Soft non tender.   Ext: No edema  MS: Adequate ROM spine, shoulders, hips and knees.  Skin: Intact, no ulcerations or rash noted.  Psych: Good eye contact, . Memory intact  anxious , obviously irritated and stressed at times tearful depressed appearing.  CNS: CN 2-12 intact, power,  normal throughout.no focal deficits noted.       Assessment & Plan:  Essential hypertension Controlled, no change in medication DASH diet and commitment to daily physical activity for a minimum of 30 minutes discussed and encouraged, as a part of hypertension management. The importance of attaining a healthy weight is also discussed.  BP/Weight 04/28/2015 08/07/2014 05/22/2014 05/20/2014 05/19/2014 05/13/2014 9/44/9675  Systolic BP 916 384 665 993 570 177 939  Diastolic BP 84 78 82 93 92 70 72  Wt. (Lbs) 184.12 176 179 178 178 178.2 177.12  BMI 28 26.77 27.22 28.74 27.87 28.78 26.94        IFG (impaired fasting glucose) Patient educated about the importance of limiting  Carbohydrate intake , the need to commit to daily physical activity for a minimum of 30 minutes , and to commit weight loss. The fact that changes in all these areas will reduce or eliminate all together the development of diabetes is stressed.    Diabetic Labs Latest Ref Rng 04/28/2015 09/25/2014 03/27/2014 09/13/2013 08/08/2012  HbA1c <5.7 % 5.8(H) - - - -  Chol 125 - 200 mg/dL 148  169 - 149 181  HDL >=46 mg/dL 56 50 - 49 52  Calc LDL <130 mg/dL 77 100(H) - 87 112(H)  Triglycerides <150 mg/dL 75 96 - 63 83  Creatinine 0.50 - 1.10 mg/dL - 0.76 0.74 0.82 0.86   BP/Weight 04/28/2015 08/07/2014 05/22/2014 05/20/2014 05/19/2014 05/13/2014 0/01/6760  Systolic BP 950 932 671 245 809 983 382  Diastolic BP 84 78 82 93 92 70 72  Wt. (Lbs) 184.12 176 179 178 178 178.2 177.12  BMI 28 26.77 27.22 28.74 27.87 28.78 26.94   No flowsheet data found.     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 Intends to seek independent living situation Wants conference with her 2 sibl;ings present to discuss furhter  Need for prophylactic vaccination and inoculation against influenza After obtaining informed consent, the vaccine is  administered by LPN.   Insomnia secondary to depression with anxiety Sleep hygiene reviewed and written information offered also. Prescription sent for  medication needed.   GERD Improved, uses medication, zantac  as needed only  Overweight (BMI 25.0-29.9) Deteriorated. Patient re-educated about  the importance of commitment to a  minimum of 150 minutes of exercise per week.  The importance of healthy food choices with portion control discussed. Encouraged to start a food diary, count calories and to consider  joining a support group. Sample diet sheets offered. Goals set by the patient for the next several months.   Weight /BMI 04/28/2015 08/07/2014 05/22/2014  WEIGHT 184 lb 1.9 oz 176 lb 179 lb  HEIGHT 5\' 8"  5\' 8"  5\' 8"   BMI 28 kg/m2 26.77 kg/m2 27.22 kg/m2    Current exercise per week 45 minutes.

## 2015-04-29 LAB — HEMOGLOBIN A1C
Hgb A1c MFr Bld: 5.8 % — ABNORMAL HIGH (ref <5.7)
Mean Plasma Glucose: 120 mg/dL — ABNORMAL HIGH (ref <117)

## 2015-04-29 LAB — LIPID PANEL
Cholesterol: 148 mg/dL (ref 125–200)
HDL: 56 mg/dL (ref >=46)
LDL Cholesterol: 77 mg/dL (ref <130)
Total CHOL/HDL Ratio: 2.6 Ratio (ref <=5.0)
Triglycerides: 75 mg/dL (ref <150)
VLDL: 15 mg/dL (ref <30)

## 2015-04-29 LAB — TSH: TSH: 2.905 u[IU]/mL (ref 0.350–4.500)

## 2015-04-29 LAB — HEPATITIS C ANTIBODY: HCV Ab: NEGATIVE

## 2015-05-09 DIAGNOSIS — E663 Overweight: Secondary | ICD-10-CM | POA: Insufficient documentation

## 2015-05-09 NOTE — Assessment & Plan Note (Signed)
Patient educated about the importance of limiting  Carbohydrate intake , the need to commit to daily physical activity for a minimum of 30 minutes , and to commit weight loss. The fact that changes in all these areas will reduce or eliminate all together the development of diabetes is stressed.    Diabetic Labs Latest Ref Rng 04/28/2015 09/25/2014 03/27/2014 09/13/2013 08/08/2012  HbA1c <5.7 % 5.8(H) - - - -  Chol 125 - 200 mg/dL 148 169 - 149 181  HDL >=46 mg/dL 56 50 - 49 52  Calc LDL <130 mg/dL 77 100(H) - 87 112(H)  Triglycerides <150 mg/dL 75 96 - 63 83  Creatinine 0.50 - 1.10 mg/dL - 0.76 0.74 0.82 0.86   BP/Weight 04/28/2015 08/07/2014 05/22/2014 05/20/2014 05/19/2014 05/13/2014 [id number]  Systolic BP 0000000 123456 [id number] 123456 [id number]  Diastolic BP 84 78 82 93 92 70 72  Wt. (Lbs) 184.12 176 179 178 178 178.2 177.12  BMI 28 26.77 27.22 28.74 27.87 28.78 26.94   No flowsheet data found.

## 2015-05-09 NOTE — Assessment & Plan Note (Signed)
Deteriorated. Patient re-educated about  the importance of commitment to a  minimum of 150 minutes of exercise per week.  The importance of healthy food choices with portion control discussed. Encouraged to start a food diary, count calories and to consider  joining a support group. Sample diet sheets offered. Goals set by the patient for the next several months.   Weight /BMI 04/28/2015 08/07/2014 05/22/2014  WEIGHT 184 lb 1.9 oz 176 lb 179 lb  HEIGHT 5\' 8"  5\' 8"  5\' 8"   BMI 28 kg/m2 26.77 kg/m2 27.22 kg/m2    Current exercise per week 45 minutes.

## 2015-05-09 NOTE — Assessment & Plan Note (Signed)
Improved, uses medication, zantac  as needed only

## 2015-05-09 NOTE — Assessment & Plan Note (Signed)
Sleep hygiene reviewed and written information offered also. Prescription sent for  medication needed.  

## 2015-05-09 NOTE — Assessment & Plan Note (Signed)
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 Intends to seek independent living situation Wants conference with her 2 sibl;ings present to discuss furhter

## 2015-05-09 NOTE — Assessment & Plan Note (Signed)
After obtaining informed consent, the vaccine is  administered by LPN.  

## 2015-05-09 NOTE — Assessment & Plan Note (Signed)
Controlled, no change in medication DASH diet and commitment to daily physical activity for a minimum of 30 minutes discussed and encouraged, as a part of hypertension management. The importance of attaining a healthy weight is also discussed.  BP/Weight 04/28/2015 08/07/2014 05/22/2014 05/20/2014 05/19/2014 05/13/2014 [id number]  Systolic BP 0000000 123456 [id number] 123456 [id number]  Diastolic BP 84 78 82 93 92 70 72  Wt. (Lbs) 184.12 176 179 178 178 178.2 177.12  BMI 28 26.77 27.22 28.74 27.87 28.78 26.94

## 2015-05-12 ENCOUNTER — Other Ambulatory Visit: Payer: Self-pay | Admitting: Family Medicine

## 2015-05-25 ENCOUNTER — Other Ambulatory Visit: Payer: Self-pay | Admitting: Family Medicine

## 2015-06-11 ENCOUNTER — Other Ambulatory Visit: Payer: Self-pay | Admitting: Family Medicine

## 2015-06-25 ENCOUNTER — Encounter: Payer: Medicare Other | Admitting: Family Medicine

## 2015-08-05 ENCOUNTER — Encounter: Payer: Medicare Other | Admitting: Family Medicine

## 2015-08-14 ENCOUNTER — Ambulatory Visit (INDEPENDENT_AMBULATORY_CARE_PROVIDER_SITE_OTHER): Payer: Medicare Other | Admitting: Family Medicine

## 2015-08-14 ENCOUNTER — Encounter: Payer: Self-pay | Admitting: Family Medicine

## 2015-08-14 VITALS — BP 120/82 | HR 88 | Resp 16 | Ht 67.5 in | Wt 178.0 lb

## 2015-08-14 DIAGNOSIS — Z0000 Encounter for general adult medical examination without abnormal findings: Secondary | ICD-10-CM | POA: Insufficient documentation

## 2015-08-14 DIAGNOSIS — Z23 Encounter for immunization: Secondary | ICD-10-CM

## 2015-08-14 DIAGNOSIS — E785 Hyperlipidemia, unspecified: Secondary | ICD-10-CM

## 2015-08-14 DIAGNOSIS — I10 Essential (primary) hypertension: Secondary | ICD-10-CM

## 2015-08-14 DIAGNOSIS — R7301 Impaired fasting glucose: Secondary | ICD-10-CM

## 2015-08-14 NOTE — Progress Notes (Signed)
Subjective:    Patient ID: TYNEKA MONTANI, female    DOB: 03-26-1951, 65 y.o.   MRN: [id number]  HPI Preventive Screening-Counseling & Management   Patient present here today for a Medicare annual wellness visit.   Current Problems (verified)   Medications Prior to Visit Allergies (verified)   PAST HISTORY  Family History (verified)  Social History Divorced, no children, still on the sub list but only part time.    Risk Factors   Current exercise habits:   In process of starting exercising routine   Dietary issues discussed: Heart healthy diet encouraged, limit fried fatty foods and red meat   Cardiac risk factors: none significant  Depression Screen  (Note: if answer to either of the following is "Yes", a more complete depression screening is indicated)   Over the past two weeks, have you felt down, depressed or hopeless? Seemed to be more depressed in the am so she is tapering off her zoloft  Over the past two weeks, have you felt little interest or pleasure in doing things? No  Have you lost interest or pleasure in daily life? No  Do you often feel hopeless? No  Do you cry easily over simple problems? No   Activities of Daily Living  In your present state of health, do you have any difficulty performing the following activities?  Driving?: No Managing money?: No Feeding yourself?:No Getting from bed to chair?:No Climbing a flight of stairs?:No Preparing food and eating?:No Bathing or showering?:No Getting dressed?:No Getting to the toilet?:No Using the toilet?:No Moving around from place to place?: No  Fall Risk Assessment In the past year have you fallen or had a near fall?:No Are you currently taking any medications that make you dizzy?:No   Hearing Difficulties: No Do you often ask people to speak up or repeat themselves?:No Do you experience ringing or noises in your ears?:No Do you have difficulty understanding soft or whispered  voices?:No  Cognitive Testing  Alert? Yes Normal Appearance?Yes  Oriented to person? Yes Place? Yes  Time? Yes  Displays appropriate judgment?Yes  Can read the correct time from a watch face? yes Are you having problems remembering things?No  Advanced Directives have been discussed with the patient?Yes, brochure already given    List the Names of Other Physician/Practitioners you currently use:  Dr Evette Cristal (obgyn) Dr Gershon Crane (opth)  Indicate any recent Medical Services you may have received from other than [facility] providers in the past year (date may be approximate).   Assessment:    Annual Wellness Exam   Plan:    Medicare Attestation  I have personally reviewed:  The patient's medical and social history  Their use of alcohol, tobacco or illicit drugs  Their current medications and supplements  The patient's functional ability including ADLs,fall risks, home safety risks, cognitive, and hearing and visual impairment  Diet and physical activities  Evidence for depression or mood disorders  The patient's weight, height, BMI, and visual acuity have been recorded in the chart. I have made referrals, counseling, and provided education to the patient based on review of the above and I have provided the patient with a written personalized care plan for preventive services.      Review of Systems     Objective:   Physical Exam BP 120/82 mmHg  Pulse 88  Resp 16  Ht 5' 7.5" (1.715 m)  Wt 178 lb (80.74 kg)  BMI 27.45 kg/m2  SpO2 98%  Assessment & Plan:  Medicare annual wellness visit, subsequent Annual exam as documented. Counseling done  re healthy lifestyle involving commitment to 150 minutes exercise per week, heart healthy diet, and attaining healthy weight.The importance of adequate sleep also discussed. Regular seat belt use and home safety, is also discussed. Changes in health habits are decided on by the patient with goals and time frames  set for  achieving them. Immunization and cancer screening needs are specifically addressed at this visit.

## 2015-08-14 NOTE — Patient Instructions (Addendum)
F/u in 4 month,call if you need me before  Please stay on DAILY zoloft 50 mg   Fasting lipid, cmp , hBA1C, CBC 2nd week in 4 month CONGRATS on weight loss  It is important that you exercise regularly at least 30 minutes 7 times a week. If you develop chest pain, have severe difficulty breathing, or feel very tired, stop exercising immediately and seek medical attention   Prevnar today Fall Prevention in the Home  Falls can cause injuries. They can happen to people of all ages. There are many things you can do to make your home safe and to help prevent falls.  WHAT CAN I DO ON THE OUTSIDE OF MY HOME?  Regularly fix the edges of walkways and driveways and fix any cracks.  Remove anything that might make you trip as you walk through a door, such as a raised step or threshold.  Trim any bushes or trees on the path to your home.  Use bright outdoor lighting.  Clear any walking paths of anything that might make someone trip, such as rocks or tools.  Regularly check to see if handrails are loose or broken. Make sure that both sides of any steps have handrails.  Any raised decks and porches should have guardrails on the edges.  Have any leaves, snow, or ice cleared regularly.  Use sand or salt on walking paths during winter.  Clean up any spills in your garage right away. This includes oil or grease spills. WHAT CAN I DO IN THE BATHROOM?   Use night lights.  Install grab bars by the toilet and in the tub and shower. Do not use towel bars as grab bars.  Use non-skid mats or decals in the tub or shower.  If you need to sit down in the shower, use a plastic, non-slip stool.  Keep the floor dry. Clean up any water that spills on the floor as soon as it happens.  Remove soap buildup in the tub or shower regularly.  Attach bath mats securely with double-sided non-slip rug tape.  Do not have throw rugs and other things on the floor that can make you trip. WHAT CAN I DO IN THE  BEDROOM?  Use night lights.  Make sure that you have a light by your bed that is easy to reach.  Do not use any sheets or blankets that are too big for your bed. They should not hang down onto the floor.  Have a firm chair that has side arms. You can use this for support while you get dressed.  Do not have throw rugs and other things on the floor that can make you trip. WHAT CAN I DO IN THE KITCHEN?  Clean up any spills right away.  Avoid walking on wet floors.  Keep items that you use a lot in easy-to-reach places.  If you need to reach something above you, use a strong step stool that has a grab bar.  Keep electrical cords out of the way.  Do not use floor polish or wax that makes floors slippery. If you must use wax, use non-skid floor wax.  Do not have throw rugs and other things on the floor that can make you trip. WHAT CAN I DO WITH MY STAIRS?  Do not leave any items on the stairs.  Make sure that there are handrails on both sides of the stairs and use them. Fix handrails that are broken or loose. Make sure that handrails are as long  as the stairways.  Check any carpeting to make sure that it is firmly attached to the stairs. Fix any carpet that is loose or worn.  Avoid having throw rugs at the top or bottom of the stairs. If you do have throw rugs, attach them to the floor with carpet tape.  Make sure that you have a light switch at the top of the stairs and the bottom of the stairs. If you do not have them, ask someone to add them for you. WHAT ELSE CAN I DO TO HELP PREVENT FALLS?  Wear shoes that:  Do not have high heels.  Have rubber bottoms.  Are comfortable and fit you well.  Are closed at the toe. Do not wear sandals.  If you use a stepladder:  Make sure that it is fully opened. Do not climb a closed stepladder.  Make sure that both sides of the stepladder are locked into place.  Ask someone to hold it for you, if possible.  Clearly mark and make  sure that you can see:  Any grab bars or handrails.  First and last steps.  Where the edge of each step is.  Use tools that help you move around (mobility aids) if they are needed. These include:  Canes.  Walkers.  Scooters.  Crutches.  Turn on the lights when you go into a dark area. Replace any light bulbs as soon as they burn out.  Set up your furniture so you have a clear path. Avoid moving your furniture around.  If any of your floors are uneven, fix them.  If there are any pets around you, be aware of where they are.  Review your medicines with your doctor. Some medicines can make you feel dizzy. This can increase your chance of falling. Ask your doctor what other things that you can do to help prevent falls.   This information is not intended to replace advice given to you by your health care provider. Make sure you discuss any questions you have with your health care provider.   Document Released: 04/02/2009 Document Revised: 10/21/2014 Document Reviewed: 07/11/2014 Elsevier Interactive Patient Education [employer].

## 2015-08-14 NOTE — Assessment & Plan Note (Signed)

## 2015-08-15 ENCOUNTER — Encounter: Payer: Self-pay | Admitting: Family Medicine

## 2015-08-15 ENCOUNTER — Other Ambulatory Visit: Payer: Self-pay | Admitting: Family Medicine

## 2015-08-17 ENCOUNTER — Telehealth: Payer: Self-pay | Admitting: Family Medicine

## 2015-08-17 DIAGNOSIS — M25511 Pain in right shoulder: Secondary | ICD-10-CM

## 2015-08-17 NOTE — Telephone Encounter (Signed)
Patient has some symptoms to discuss with the nurse, please advise?

## 2015-08-17 NOTE — Telephone Encounter (Signed)
Patient is requesting to speak with Velna Hatchet since she was present during last visit.

## 2015-08-18 NOTE — Addendum Note (Signed)
Addended by: Eual Fines on: 08/18/2015 12:58 PM   Modules accepted: Orders

## 2015-08-18 NOTE — Telephone Encounter (Signed)
pls order I will sign

## 2015-08-18 NOTE — Telephone Encounter (Signed)
Forgot to tell you that she has been having right shoulder pain x 2 months, in the deltoid area. Thinks she needs an xray. Please advise

## 2015-08-18 NOTE — Telephone Encounter (Signed)
X-ray ordered.

## 2015-08-21 ENCOUNTER — Ambulatory Visit (HOSPITAL_COMMUNITY)
Admission: RE | Admit: 2015-08-21 | Discharge: 2015-08-21 | Disposition: A | Discharge status: Home / Self Care | Payer: Medicare Other | Source: Ambulatory Visit | Attending: Family Medicine | Admitting: Family Medicine

## 2015-08-21 DIAGNOSIS — M25511 Pain in right shoulder: Secondary | ICD-10-CM | POA: Diagnosis not present

## 2015-08-25 ENCOUNTER — Other Ambulatory Visit: Payer: Self-pay | Admitting: Family Medicine

## 2015-08-25 DIAGNOSIS — R9389 Abnormal findings on diagnostic imaging of other specified body structures: Secondary | ICD-10-CM

## 2015-08-28 ENCOUNTER — Encounter: Payer: Self-pay | Admitting: *Deleted

## 2015-09-03 ENCOUNTER — Encounter (HOSPITAL_COMMUNITY): Payer: Medicare Other | Attending: Hematology & Oncology | Admitting: Hematology & Oncology

## 2015-09-03 ENCOUNTER — Encounter (HOSPITAL_COMMUNITY): Payer: Self-pay | Admitting: Hematology & Oncology

## 2015-09-03 ENCOUNTER — Ambulatory Visit (HOSPITAL_COMMUNITY)
Admission: RE | Admit: 2015-09-03 | Discharge: 2015-09-03 | Disposition: A | Discharge status: Home / Self Care | Payer: Medicare Other | Source: Ambulatory Visit | Attending: Hematology & Oncology | Admitting: Hematology & Oncology

## 2015-09-03 ENCOUNTER — Encounter (HOSPITAL_COMMUNITY): Payer: Medicare Other

## 2015-09-03 VITALS — BP 130/76 | HR 76 | Temp 98.3°F | Resp 20 | Wt 176.6 lb

## 2015-09-03 DIAGNOSIS — K219 Gastro-esophageal reflux disease without esophagitis: Secondary | ICD-10-CM | POA: Insufficient documentation

## 2015-09-03 DIAGNOSIS — Z90710 Acquired absence of both cervix and uterus: Secondary | ICD-10-CM | POA: Insufficient documentation

## 2015-09-03 DIAGNOSIS — Z79899 Other long term (current) drug therapy: Secondary | ICD-10-CM | POA: Diagnosis not present

## 2015-09-03 DIAGNOSIS — F329 Major depressive disorder, single episode, unspecified: Secondary | ICD-10-CM | POA: Diagnosis not present

## 2015-09-03 DIAGNOSIS — M899 Disorder of bone, unspecified: Secondary | ICD-10-CM | POA: Insufficient documentation

## 2015-09-03 DIAGNOSIS — M25511 Pain in right shoulder: Secondary | ICD-10-CM | POA: Insufficient documentation

## 2015-09-03 DIAGNOSIS — I10 Essential (primary) hypertension: Secondary | ICD-10-CM | POA: Diagnosis not present

## 2015-09-03 DIAGNOSIS — Z8042 Family history of malignant neoplasm of prostate: Secondary | ICD-10-CM | POA: Diagnosis not present

## 2015-09-03 LAB — CBC WITH DIFFERENTIAL/PLATELET
Basophils Absolute: 0 10*3/uL (ref 0.0–0.1)
Basophils Relative: 0 %
Eosinophils Absolute: 0.1 10*3/uL (ref 0.0–0.7)
Eosinophils Relative: 2 %
HCT: 40.6 % (ref 36.0–46.0)
Hemoglobin: 13.5 g/dL (ref 12.0–15.0)
Lymphocytes Relative: 42 %
Lymphs Abs: 2.3 10*3/uL (ref 0.7–4.0)
MCH: 30.9 pg (ref 26.0–34.0)
MCHC: 33.3 g/dL (ref 30.0–36.0)
MCV: 92.9 fL (ref 78.0–100.0)
Monocytes Absolute: 0.4 10*3/uL (ref 0.1–1.0)
Monocytes Relative: 7 %
Neutro Abs: 2.7 10*3/uL (ref 1.7–7.7)
Neutrophils Relative %: 49 %
Platelets: 216 10*3/uL (ref 150–400)
RBC: 4.37 MIL/uL (ref 3.87–5.11)
RDW: 12.4 % (ref 11.5–15.5)
WBC: 5.4 10*3/uL (ref 4.0–10.5)

## 2015-09-03 LAB — COMPREHENSIVE METABOLIC PANEL
ALT: 16 U/L (ref 14–54)
AST: 19 U/L (ref 15–41)
Albumin: 4.5 g/dL (ref 3.5–5.0)
Alkaline Phosphatase: 48 U/L (ref 38–126)
Anion gap: 6 (ref 5–15)
BUN: 9 mg/dL (ref 6–20)
CO2: 31 mmol/L (ref 22–32)
Calcium: 9.4 mg/dL (ref 8.9–10.3)
Chloride: 102 mmol/L (ref 101–111)
Creatinine, Ser: 0.82 mg/dL (ref 0.44–1.00)
GFR calc Af Amer: >60 mL/min (ref >60)
GFR calc non Af Amer: >60 mL/min (ref >60)
Glucose, Bld: 96 mg/dL (ref 65–99)
Potassium: 3.6 mmol/L (ref 3.5–5.1)
Sodium: 139 mmol/L (ref 135–145)
Total Bilirubin: 0.8 mg/dL (ref 0.3–1.2)
Total Protein: 8.1 g/dL (ref 6.5–8.1)

## 2015-09-03 NOTE — Progress Notes (Signed)
Tiffany Mendoza at [facility] Note  Patient Care Team: Fayrene Helper, MD as PCP - General W Evette Cristal, MD as Consulting Physician (Obstetrics and Gynecology) Rutherford Guys, MD as Consulting Physician (Ophthalmology)  CHIEF COMPLAINTS/PURPOSE OF CONSULTATION:  Right shoulder film on 08/21/2015 with oval areas of lucency in the humeral head and neck as well as in the scapula.  HISTORY OF PRESENTING ILLNESS:  Tiffany Mendoza 65 y.o. female is here because of abnormal imaging of the shoulder. Tiffany Mendoza was here alone today.  She had an X-Ray done on her right shoulder on 08/21/15. This showed some lucent areas.   She is the primary caregiver for her mother. She says that she has to do a lot for her and in the past 6 months she has gotten more combative. Her mother forgets a lot of things and seems to be in denial about her situation. The patient lives with her mother.  She states that she feels good other than stress. She said that she has always been a laid back person who is not anxious. She says that this is the most stressed she has every been and she attributes this to taking care of her Mother. She said that she is getting depressed because she doesn't like where she is living now. She said that she is not liking her life and that she needs something to get up for in the morning.   She started out saying that her appetite is good. However as she continued she stated that she is eating later now because she has been going through everything and trimming down. She said that packing has "got next to me" She says that she has lost interest in her appetite that it is "not gone but something else took the place of it" She says that she gets up in the morning and she is not focused on eating. She says that when she goes to sleep later her meal gets pushed back. She tries to eat at least 2 times a day.   She said that after she puts her Mother to bed she sometimes eats  [employer] ice cream late at night. Her stomach had been hurting and she thought that this was just because of stress but noticed that it hurts if she eats after 12 am.   Her doctor told her that she needs to lose 6-8 pounds. She has lost 2 pounds since 08/14/15. She has been trying to do weight bearing exercises.  She says that she has an on and off Rheumatism on her right shoulder. Every once an a while she feels a little ache. She does notice it when she lifts and carries things. She also feels a little ache when she lays down. Her chiropractor said that this might be because she stays in one position for too long.  She asked if her vitamins would have anything to do with her body being out of whack. She stopped taking her vitamins because she was worried about this and just took her normal medicine.  She gets a bone density test taken every couple of years. She said that her last one was improved.  MEDICAL HISTORY:  Past Medical History  Diagnosis Date  . Allergy   . Neck pain   . Insomnia   . Carotid bruit   . Dermatophytosis of foot   . Osteoporosis   . Adjustment disorder with mixed anxiety and depressed mood   . Depression   .  Hypertension   . GERD (gastroesophageal reflux disease)     SURGICAL HISTORY: Past Surgical History  Procedure Laterality Date  . Abdominal hysterectomy  1988  . Hemorrhoid surgery    . Myoectomy      SOCIAL HISTORY: Social History   Social History  . Marital Status: Divorced    Spouse Name: N/A  . Number of Children: N/A  . Years of Education: N/A   Occupational History  . Not on file.   Social History Main Topics  . Smoking status: Never Smoker   . Smokeless tobacco: Not on file  . Alcohol Use: No  . Drug Use: No  . Sexual Activity: Not Currently   Other Topics Concern  . 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 Colon polyps Father   . Prostate cancer Father   . Hypertension Sister   . Liver cancer Paternal Grandmother    indicated that her mother is alive. She indicated that her father is deceased. She indicated that both of her sisters are alive.   ALLERGIES:  has No Known Allergies.  MEDICATIONS:  Current Outpatient Prescriptions  Medication Sig Dispense Refill  . b complex vitamins tablet Take 1 tablet by mouth daily.    . Cholecalciferol (VITAMIN D3) 1000 UNITS CAPS Take by mouth.      . clorazepate (TRANXENE) 7.5 MG tablet TAKE ONE TABLET BY MOUTH TWICE DAILY AS NEEDED 60 tablet 1  . fish oil-omega-3 fatty acids 1000 MG capsule Take 1 g by mouth daily.    Marland Kitchen GARLIC PO Take by mouth.    . hydrochlorothiazide (HYDRODIURIL) 25 MG tablet TAKE ONE TABLET BY MOUTH ONCE DAILY 90 tablet 0  . HYDROcodone-acetaminophen (NORCO/VICODIN) 5-325 MG tablet Take 1 tablet by mouth every 3 (three) hours as needed for moderate pain.    . Nutritional Supplements (NUTRITIONAL SUPPLEMENT PO) Take by mouth. Psyllium Husk    . Polyethylene Glycol 3350 (MIRALAX PO) Take by mouth.      . Potassium 99 MG TABS Take by mouth daily.      . ranitidine (ZANTAC) 150 MG capsule Take 150 mg by mouth every evening.    . sertraline (ZOLOFT) 50 MG tablet TAKE ONE TABLET BY MOUTH ONCE DAILY 30 tablet 2  . vitamin C (ASCORBIC ACID) 500 MG tablet Take 500 mg by mouth daily.     No current facility-administered medications for this visit.    Review of Systems  Constitutional: Negative.   HENT: Negative.   Eyes: Negative.   Respiratory: Negative.     Cardiovascular: Negative.   Gastrointestinal: Negative.  Negative for abdominal pain.  Genitourinary: Negative.   Musculoskeletal: Negative.        Rheumatism on right shoulder  Skin: Negative.   Neurological: Negative.   Endo/Heme/Allergies: Negative.   Psychiatric/Behavioral: Positive for depression.       Stressed due to taking care of her Mother Depressed because she doesn't like where she is living and not liking her life.  All other systems reviewed and are negative.  14 point ROS was done and is otherwise as detailed above or in HPI   PHYSICAL EXAMINATION: ECOG PERFORMANCE STATUS: 0 - Asymptomatic  Filed Vitals:   09/03/15 1433  BP: 130/76  Pulse: 76  Temp: 98.3 F (36.8 C)  Resp: 20   Filed Weights   09/03/15 1433  Weight: 176 lb 9.6 oz (80.105 kg)     Physical Exam  Constitutional: She is oriented to person, place, and time and well-developed, well-nourished, and in no distress.  HENT:  Head: Normocephalic and atraumatic.  Nose: Nose normal.  Mouth/Throat: Oropharynx is clear and moist. No oropharyngeal exudate.  Eyes: Conjunctivae and EOM are normal. Pupils are equal, round, and reactive to light. Right eye exhibits no discharge. Left eye exhibits no discharge. No scleral icterus.  Neck: Normal range of motion. Neck supple. No tracheal deviation present. No thyromegaly present.  Cardiovascular: Normal rate, regular rhythm and normal heart sounds.  Exam reveals no gallop and no friction rub.   No murmur heard. Pulmonary/Chest: Effort normal and breath sounds normal. She has no wheezes. She has no rales.  Abdominal: Soft. Bowel sounds are normal. She exhibits no distension and no mass. There is no tenderness. There is no rebound and no guarding.  Musculoskeletal: Normal range of motion. She exhibits no edema.  Lymphadenopathy:    She has no cervical adenopathy.  Neurological: She is alert and oriented to person, place, and time. She has normal reflexes. No  cranial nerve deficit. Gait normal. Coordination normal.  Skin: Skin is warm and dry. No rash noted.  Psychiatric: Mood, memory, affect and judgment normal.  Nursing note and vitals reviewed.   LABORATORY DATA:  I have reviewed the data as listed Lab Results  Component Value Date   WBC 5.4 09/03/2015   HGB 13.5 09/03/2015   HCT 40.6 09/03/2015   MCV 92.9 09/03/2015   PLT 216 09/03/2015    RADIOGRAPHIC STUDIES: I have personally reviewed the radiological images as listed and agreed with the findings in the report. Study Result     CLINICAL DATA: Right shoulder pain for several months  EXAM: RIGHT SHOULDER - 2+ VIEW  COMPARISON: None.  FINDINGS: There is mild arthritic change of the acromioclavicular joint. There is no fracture or dislocation. There are several oval areas of lucency measuring 2-3 mm in the humeral head and neck as well as in the scapula. These are nonspecific. In the appropriate clinical setting they could be related to myeloma but are by no means specific. Glenohumeral joint shows no significant degenerative change.  IMPRESSION: No acute finding   Electronically Signed  By: Skipper Cliche M.D.  On: 08/21/2015 17:26     ASSESSMENT & PLAN:  Right shoulder film on 08/21/2015 with oval areas of lucency in the humeral head and neck as well as in the scapula. R shoulder pain  We discussed potential causes of the findings on her plain films.  She was concerned about myeloma. I advised her that we would certainly proceed with a myeloma evaluation. We have ordered the following:  Orders Placed This Encounter  Procedures  . DG Bone Survey Met    Add on, nbs    Standing Status: Future     Number of Occurrences:      Standing Expiration Date: 11/02/2016    Order Specific Question:  Reason for Exam (SYMPTOM  OR DIAGNOSIS REQUIRED)    Answer:  ;possible mylemoa    Order Specific Question:  Preferred imaging location?    Answer:  [facility]  . Protein electrophoresis, urine    Standing Status: Future     Number of Occurrences:      Standing Expiration Date: 09/02/2016  . Immunofixation, urine    Standing Status: Future     Number of Occurrences:      Standing Expiration Date: 09/02/2016  . CBC with Differential    Standing Status: Future     Number of Occurrences: 1     Standing Expiration Date: 09/02/2016  . Comprehensive metabolic panel    Standing Status: Future     Number of Occurrences: 1     Standing Expiration Date: 09/02/2016  . Multiple myeloma panel, serum    Standing Status: Future     Number of Occurrences: 1     Standing Expiration Date: 09/02/2016  . Kappa/lambda light chains    Standing Status: Future     Number of Occurrences: 1     Standing Expiration Date: 09/02/2016    She will return in a week and a half for a follow up and to discuss her lab results.   All questions were answered. The patient knows to call the clinic with any problems, questions or concerns.  This document serves as a record of services personally performed by Ancil Linsey, MD. It was created on her behalf by Kandace Blitz, a trained medical scribe. The creation of this record is based on the scribe's personal observations and the provider's statements to them. This document has been checked and approved by the attending provider.  I have reviewed the above documentation for accuracy and completeness, and I agree with the above.  This note was electronically signed.    Molli Hazard, MD  09/03/2015 4:29 PM

## 2015-09-03 NOTE — Patient Instructions (Addendum)
[place] at [facility] Discharge Instructions  RECOMMENDATIONS MADE BY THE CONSULTANT AND ANY TEST RESULTS WILL BE SENT TO YOUR REFERRING PHYSICIAN.    Exam and discussion by Dr Whitney Muse today On your x-ray your bone should be white and some of your on your x-ray shows some paler areas. So we are going to work this up. Need to do 24 hour urine.  Make sure to keep this in the fridge or on ice. Lab work today Corporate treasurer, this will look at all your long bones and your skull  Return to see the doctor in Please call the clinic if you have any questions or concerns      24-Hour Urine Collection HOW DO I DO A 24-HOUR URINE COLLECTION?  When you get up in the morning, urinate in the toilet and flush. Write down the time. This will be your start time on the day of collection and your end time on the next morning.  From then on, collect all of your urine in the plastic jug that is given to you.  Stop collecting your urine 24 hours after you started.  If the plastic jug that is given to you already has liquid in it, that is okay. Do not throw out the liquid or rinse out the jug. Some tests need the liquid to be added to your urine.  Keep your plastic jug cool in an ice chest or keep it in the refrigerator during the test.  When 24 hours are over, bring your plastic jug to the clinic lab. Keep the jug cool in an ice chest while you are bringing it to the lab.   This information is not intended to replace advice given to you by your health care provider. Make sure you discuss any questions you have with your health care provider.   Document Released: 09/02/2008 Document Revised: 06/27/2014 Document Reviewed: 10/30/2013 Elsevier Interactive Patient Education 2016 [employer].     Thank you for choosing [place] at [facility] to provide your oncology and hematology care.  To afford each patient quality time with our provider, please  arrive at least 15 minutes before your scheduled appointment time.   Beginning January 23rd 2017 lab work for the [facility] will be done in the  Main lab at [facility] on 1st floor. If you have a lab appointment with the [place] please come in thru the  Main Entrance and check in at the main information desk  You need to re-schedule your appointment should you arrive 10 or more minutes late.  We strive to give you quality time with our providers, and arriving late affects you and other patients whose appointments are after yours.  Also, if you no show three or more times for appointments you may be dismissed from the clinic at the providers discretion.     Again, thank you for choosing [facility].  Our hope is that these requests will decrease the amount of time that you wait before being seen by our physicians.       _____________________________________________________________  Should you have questions after your visit to [facility], please contact our office at [phone] between the hours of 8:30 a.m. and 4:30 p.m.  Voicemails left after 4:30 p.m. will not be returned until the following business day.  For prescription refill requests, have your pharmacy contact our office.         Resources  For Cancer Patients and their Caregivers ? American Cancer Society: Can assist with transportation, wigs, general needs, runs Look Good Feel Better.        806 758 8592 ? Cancer Care: Provides financial assistance, online support groups, medication/co-pay assistance.  1-800-813-HOPE (216)484-3234) ? [employer] Assists [place] cancer patients and their families through emotional , educational and financial support.  (304) 004-9946 ? [place] DSS Where to apply for food stamps, Medicaid and utility assistance. 531-066-5820 ? [facility]: Transportation to medical appointments. 405-067-8020 ? Social Security Administration: May  apply for disability if have a Stage IV cancer. 959-046-1115 606-599-8614 ? [employer], Disability and Transit Services: Assists with nutrition, care and transit needs. 661-191-8412

## 2015-09-04 LAB — KAPPA/LAMBDA LIGHT CHAINS
Kappa free light chain: 21.93 mg/L — ABNORMAL HIGH (ref 3.30–19.40)
Kappa, lambda light chain ratio: 1.88 — ABNORMAL HIGH (ref 0.26–1.65)
Lambda free light chains: 11.69 mg/L (ref 5.71–26.30)

## 2015-09-07 ENCOUNTER — Telehealth: Payer: Self-pay | Admitting: Family Medicine

## 2015-09-07 ENCOUNTER — Encounter (HOSPITAL_COMMUNITY): Payer: Medicare Other

## 2015-09-07 DIAGNOSIS — M899 Disorder of bone, unspecified: Secondary | ICD-10-CM

## 2015-09-07 DIAGNOSIS — M25511 Pain in right shoulder: Secondary | ICD-10-CM | POA: Diagnosis not present

## 2015-09-07 MED ORDER — PREDNISONE 5 MG PO TABS: 5.0000 mg | ORAL_TABLET | Freq: Two times a day (BID) | ORAL | Status: DC

## 2015-09-07 MED ORDER — BENZONATATE 100 MG PO CAPS: 100.0000 mg | ORAL_CAPSULE | Freq: Two times a day (BID) | ORAL | Status: DC | PRN

## 2015-09-07 MED ORDER — AZITHROMYCIN 250 MG PO TABS: ORAL_TABLET | ORAL | Status: DC

## 2015-09-07 NOTE — Telephone Encounter (Signed)
Called in stating that since this past Thursday, cough, scant sputum, tight in chest Z pack, tessalon perles and prednisone prescribed, she is aware

## 2015-09-08 LAB — UIFE/LIGHT CHAINS/TP QN, 24-HR UR
% BETA, Urine: 0 %
ALPHA 1 URINE: 0 %
Albumin, U: 100 %
Alpha 2, Urine: 0 %
Free Kappa/Lambda Ratio: 18.57 — ABNORMAL HIGH (ref 2.04–10.37)
Free Lambda Lt Chains,Ur: 0.07 mg/L — ABNORMAL LOW (ref 0.24–6.66)
Free Lt Chn Excr Rate: 1.3 mg/L — ABNORMAL LOW (ref 1.35–24.19)
GAMMA GLOBULIN URINE: 0 %
Time.: 24 hours
Total Protein, Urine-Ur/day: 77.6 mg/24 hr (ref 30.0–150.0)
Total Protein, Urine: 4.5 mg/dL
Volume, Urin-: 1725 mL

## 2015-09-08 LAB — MULTIPLE MYELOMA PANEL, SERUM
Albumin SerPl Elph-Mcnc: 4 g/dL (ref 2.9–4.4)
Albumin/Glob SerPl: 1.2 (ref 0.7–1.7)
Alpha 1: 0.2 g/dL (ref 0.0–0.4)
Alpha2 Glob SerPl Elph-Mcnc: 0.8 g/dL (ref 0.4–1.0)
B-Globulin SerPl Elph-Mcnc: 1.1 g/dL (ref 0.7–1.3)
Gamma Glob SerPl Elph-Mcnc: 1.4 g/dL (ref 0.4–1.8)
Globulin, Total: 3.5 g/dL (ref 2.2–3.9)
IgA: 192 mg/dL (ref 87–352)
IgG (Immunoglobin G), Serum: 1427 mg/dL (ref 700–1600)
IgM, Serum: 56 mg/dL (ref 26–217)
Total Protein ELP: 7.5 g/dL (ref 6.0–8.5)

## 2015-09-08 LAB — IMMUNOFIXATION, URINE

## 2015-09-09 ENCOUNTER — Encounter: Payer: Self-pay | Admitting: Family Medicine

## 2015-09-09 ENCOUNTER — Telehealth: Payer: Self-pay | Admitting: Family Medicine

## 2015-09-09 ENCOUNTER — Ambulatory Visit (INDEPENDENT_AMBULATORY_CARE_PROVIDER_SITE_OTHER): Payer: Medicare Other | Admitting: Family Medicine

## 2015-09-09 VITALS — BP 114/76 | HR 76 | Temp 98.5°F | Resp 16 | Ht 67.0 in | Wt 176.0 lb

## 2015-09-09 DIAGNOSIS — F418 Other specified anxiety disorders: Secondary | ICD-10-CM | POA: Diagnosis not present

## 2015-09-09 DIAGNOSIS — J209 Acute bronchitis, unspecified: Secondary | ICD-10-CM | POA: Diagnosis not present

## 2015-09-09 DIAGNOSIS — I10 Essential (primary) hypertension: Secondary | ICD-10-CM

## 2015-09-09 DIAGNOSIS — J040 Acute laryngitis: Secondary | ICD-10-CM | POA: Diagnosis not present

## 2015-09-09 MED ORDER — PROMETHAZINE-DM 6.25-15 MG/5ML PO SYRP: ORAL_SOLUTION | ORAL | Status: DC

## 2015-09-09 MED ORDER — PREDNISONE 5 MG (21) PO TBPK: 5.0000 mg | ORAL_TABLET | ORAL | Status: DC

## 2015-09-09 NOTE — Telephone Encounter (Signed)
Patient is scheduled today at 11:00 am.

## 2015-09-09 NOTE — Telephone Encounter (Signed)
Called on 3/21 with worsening cough and chest congestion, started on meds 2 days prior and worsening. Pls sched work in appt today before 2:30 pm as able , thanks

## 2015-09-09 NOTE — Patient Instructions (Signed)
F/u as before, call if you need me sooner  You are treated for acute bronchitis and laryngitis. VOICE REST Salt  Water gargles 2 to 3 times daily, also honey and lime are soothing to the throat. Drink a lot of water Complete azithromycin, the antibiotic Start new prescription for prednisone do not take anymore of the  Last prednisone tablets you have  Take tessalon perles as prescribed  Laryngitis Laryngitis is swelling (inflammation) of your vocal cords. This causes hoarseness, coughing, loss of voice, sore throat, or a dry throat. When your vocal cords are inflamed, your voice sounds different. Laryngitis can be temporary (acute) or long-term (chronic). Most cases of acute laryngitis improve with time. Chronic laryngitis is laryngitis that lasts for more than three weeks. HOME CARE  Drink enough fluid to keep your pee (urine) clear or pale yellow.  Breathe in moist air. Use a humidifier if you live in a dry climate.  Take medicines only as told by your doctor.  Do not smoke cigarettes or electronic cigarettes. If you need help quitting, ask your doctor.  Talk as little as possible. Also avoid whispering, which can cause vocal strain.  Write instead of talking. Do this until your voice is back to normal. GET HELP IF:  You have a fever.  Your pain is worse.  You have trouble swallowing. GET HELP RIGHT AWAY IF:  You cough up blood.  You have trouble breathing.   This information is not intended to replace advice given to you by your health care provider. Make sure you discuss any questions you have with your health care provider.   Document Released: 05/26/2011 Document Revised: 06/27/2014 Document Reviewed: 11/19/2013 Elsevier Interactive Patient Education [employer].

## 2015-09-09 NOTE — Progress Notes (Signed)
   Subjective:    Patient ID: Tiffany Mendoza, female    DOB: Jan 09, 1951, 65 y.o.   MRN: [id number]  HPI 5 day  h/o worsening chest congestion, associated with fever and chills intermittently. Sputum is thick and yellow. C/o bilateral ear pressure, denies hearing loss . Increasing fatigue , poor appetitie and sleep disturbed by cough. No improvement with OTC medication.Started on Z pack and low dose prednisone , notes increased hoarseness and a lot of chest tightness and congestion     Review of Systems See HPI  Denies chest pains, palpitations and leg swelling Denies abdominal pain, nausea, vomiting,diarrhea or constipation.   Denies dysuria, frequency, hesitancy or incontinence. Denies joint pain, swelling and limitation in mobility. Denies headaches, seizures, numbness, or tingling. Denies depression, anxiety or insomnia. Denies skin break down or rash.         Objective:   Physical Exam BP 114/76 mmHg  Pulse 76  Temp(Src) 98.5 F (36.9 C) (Oral)  Resp 16  Ht 5\' 7"  (1.702 m)  Wt 176 lb (79.833 kg)  BMI 27.56 kg/m2  SpO2 97% Patient alert and oriented and in no cardiopulmonary distress.Hoarse, ill appearing  HEENT: No facial asymmetry, EOMI,   oropharynx pink and moist.  Neck supple no JVD, no mass. TM clear, No sinus tenderness, nasal mucosa erythematous and edematous Chest: Adequate air entry bilaterally, few crackles bilaterally no wheezes  CVS: S1, S2 no murmurs, no S3.Regular rate.  ABD: Soft non tender.   Ext: No edema  MS: Adequate ROM spine, shoulders, hips and knees.  Skin: Intact, no ulcerations or rash noted.  Psych: Good eye contact, normal affect. Memory intact not anxious or depressed appearing.  CNS: CN 2-12 intact, power,  normal throughout.no focal deficits noted.        Assessment & Plan:  Acute bronchitis Complete z pack started 2 days ago, also additional prednisone prescribed in higher dose, tessalon perles as before Call for  worsening symptoms or no improvement  Acute laryngitis Voice rest, salt water gargles, honey and lemon and TIME  Essential hypertension Controlled, no change in medication   Depression with anxiety Controlled, no change in medication

## 2015-09-13 DIAGNOSIS — J040 Acute laryngitis: Secondary | ICD-10-CM | POA: Insufficient documentation

## 2015-09-13 NOTE — Assessment & Plan Note (Signed)
Voice rest, salt water gargles, honey and lemon and TIME

## 2015-09-13 NOTE — Assessment & Plan Note (Signed)
Controlled, no change in medication  

## 2015-09-13 NOTE — Assessment & Plan Note (Signed)
Complete z pack started 2 days ago, also additional prednisone prescribed in higher dose, tessalon perles as before Call for worsening symptoms or no improvement

## 2015-09-17 ENCOUNTER — Ambulatory Visit (HOSPITAL_COMMUNITY): Payer: Medicare Other | Admitting: Hematology & Oncology

## 2015-09-18 ENCOUNTER — Encounter (HOSPITAL_BASED_OUTPATIENT_CLINIC_OR_DEPARTMENT_OTHER): Payer: Medicare Other | Admitting: Hematology & Oncology

## 2015-09-18 ENCOUNTER — Telehealth: Payer: Self-pay | Admitting: Family Medicine

## 2015-09-18 ENCOUNTER — Encounter (HOSPITAL_COMMUNITY): Payer: Self-pay | Admitting: Hematology & Oncology

## 2015-09-18 VITALS — BP 131/72 | HR 78 | Temp 98.1°F | Resp 18 | Wt 180.1 lb

## 2015-09-18 DIAGNOSIS — M899 Disorder of bone, unspecified: Secondary | ICD-10-CM | POA: Diagnosis not present

## 2015-09-18 DIAGNOSIS — R803 Bence Jones proteinuria: Secondary | ICD-10-CM | POA: Diagnosis present

## 2015-09-18 NOTE — Telephone Encounter (Signed)
Patient is calling stating that she thinks she needs more antibiotics that she is not 100%, she still is having sinus drainage and somewhat of a sorethroat, please advise?

## 2015-09-18 NOTE — Patient Instructions (Signed)
[place] at [facility] Discharge Instructions  RECOMMENDATIONS MADE BY THE CONSULTANT AND ANY TEST RESULTS WILL BE SENT TO YOUR REFERRING PHYSICIAN.   Exam and discussion by Dr Whitney Muse today  Your light chain serum ration is abnormal this is an indication for a bone marrow biopsy.  We need to look at your cells in your bone marrow, we can do this by a bone marrow biopsy.  They do this at [facility], they will put in an IV and make you sleepy, so you will need a driver that day   Return to see the doctor in few weeks after your bone marrow biopsy. If it has not been at least two weeks since your bone marrow biopsy then please call and change your appt, we will not have all the results back yet because it takes about 2 weeks for these results to come back  Please call the clinic if you have any questions or concerns        Bone Marrow Aspiration and Bone Marrow Biopsy Bone marrow aspiration and bone marrow biopsy are procedures that are done to diagnose blood disorders. You may also have one of these procedures to help diagnose infections or some types of cancer. Bone marrow is the soft tissue that is inside your bones. Blood cells are produced in bone marrow. For bone marrow aspiration, a sample of tissue in liquid form is removed from inside your bone. For a bone marrow biopsy, a small core of bone marrow tissue is removed. Then these samples are examined under a microscope or tested in a lab. You may need these procedures if you have an abnormal complete blood count (CBC). The aspiration or biopsy sample is usually taken from the top of your hip bone. Sometimes, an aspiration sample is taken from your chest bone (sternum). LET [facility] CARE PROVIDER KNOW ABOUT:  Any allergies you have.  All medicines you are taking, including vitamins, herbs, eye drops, creams, and over-the-counter medicines.  Previous problems you or members of your family have had with  the use of anesthetics.  Any blood disorders you have.  Previous surgeries you have had.  Any medical conditions you may have.  Whether you are pregnant or you think that you may be pregnant. RISKS AND COMPLICATIONS Generally, this is a safe procedure. However, problems may occur, including:  Infection.  Bleeding. BEFORE THE PROCEDURE  Ask your health care provider about:  Changing or stopping your regular medicines. This is especially important if you are taking diabetes medicines or blood thinners.  Taking medicines such as aspirin and ibuprofen. These medicines can thin your blood. Do not take these medicines before your procedure if your health care provider instructs you not to.  Plan to have someone take you home after the procedure.  If you go home right after the procedure, plan to have someone with you for 24 hours. PROCEDURE   An IV tube may be inserted into one of your veins.  The injection site will be cleaned with a germ-killing solution (antiseptic).  You will be given one or more of the following:  A medicine that helps you relax (sedative).  A medicine that numbs the area (local anesthetic).  The bone marrow sample will be removed as follows:  For an aspiration, a hollow needle will be inserted through your skin and into your bone. Bone marrow fluid will be drawn up into a syringe.  For a biopsy, your health care provider  will use a hollow needle to remove a core of tissue from your bone marrow.  The needle will be removed.  A bandage (dressing) will be placed over the insertion site and taped in place. The procedure may vary among health care providers and hospitals. AFTER THE PROCEDURE  Your blood pressure, heart rate, breathing rate, and blood oxygen level will be monitored often until the medicines you were given have worn off.  Return to your normal activities as directed by your health care provider.   This information is not intended to  replace advice given to you by your health care provider. Make sure you discuss any questions you have with your health care provider.   Document Released: 06/09/2004 Document Revised: 10/21/2014 Document Reviewed: 05/28/2014 Elsevier Interactive Patient Education 2016 [employer].        Thank you for choosing [place] at [facility] to provide your oncology and hematology care.  To afford each patient quality time with our provider, please arrive at least 15 minutes before your scheduled appointment time.   Beginning January 23rd 2017 lab work for the [facility] will be done in the  Main lab at [facility] on 1st floor. If you have a lab appointment with the [facility] please come in thru the  Main Entrance and check in at the main information desk  You need to re-schedule your appointment should you arrive 10 or more minutes late.  We strive to give you quality time with our providers, and arriving late affects you and other patients whose appointments are after yours.  Also, if you no show three or more times for appointments you may be dismissed from the clinic at the providers discretion.     Again, thank you for choosing [facility].  Our hope is that these requests will decrease the amount of time that you wait before being seen by our physicians.       _____________________________________________________________  Should you have questions after your visit to [facility], please contact our office at [phone] between the hours of 8:30 a.m. and 4:30 p.m.  Voicemails left after 4:30 p.m. will not be returned until the following business day.  For prescription refill requests, have your pharmacy contact our office.         Resources For Cancer Patients and their Caregivers ? American Cancer Society: Can assist with transportation, wigs, general needs, runs Look Good Feel Better.        (414)473-7442 ? Cancer  Care: Provides financial assistance, online support groups, medication/co-pay assistance.  1-800-813-HOPE 628-284-7102) ? [facility] Assists [place] cancer patients and their families through emotional , educational and financial support.  312-533-7643 ? [place] DSS Where to apply for food stamps, Medicaid and utility assistance. 450-130-9599 ? [facility]: Transportation to medical appointments. 951-278-9878 ? Social Security Administration: May apply for disability if have a Stage IV cancer. 406-792-0387 (785)820-6521 ? [employer], Disability and Transit Services: Assists with nutrition, care and transit needs. 860-725-2304

## 2015-09-18 NOTE — Telephone Encounter (Signed)
Very minimal sinus drainage and DOESN'T have sore throat but a tingle every now and then. Advised no more antibiotics needed but to call back Monday with an update on how she's feeling

## 2015-09-18 NOTE — Progress Notes (Signed)
[facility] [place] Note  Patient Care Team: Fayrene Helper, MD as PCP - General W Tiffany Cristal, MD as Consulting Physician (Obstetrics and Gynecology) Tiffany Guys, MD as Consulting Physician (Ophthalmology)  CHIEF COMPLAINTS/PURPOSE OF CONSULTATION:  Right shoulder film on 08/21/2015 with oval areas of lucency in the humeral head and neck as well as in the scapula.  HISTORY OF PRESENTING ILLNESS:  Tiffany Mendoza 65 y.o. female is here because of abnormal imaging of the shoulder. Tiffany Mendoza was here alone today.  She had an X-Ray done on her right shoulder on 08/21/15. This showed some lucent areas.   Tiffany Mendoza returns to the [facility] alone today.   At the start of her appointment, she was advised that many of her labs came back normal, with the exception of the presence of light chains in her blood and urine.  As myeloma and MGUS were reviewed with the patient during her appointment, many questions about the nature of the diseases were answered.   MEDICAL HISTORY:  Past Medical History  Diagnosis Date  . Allergy   . Neck pain   . Insomnia   . Carotid bruit   . Dermatophytosis of foot   . Osteoporosis   . Adjustment disorder with mixed anxiety and depressed mood   . Depression   . Hypertension   . GERD (gastroesophageal reflux disease)     SURGICAL HISTORY: Past Surgical History  Procedure Laterality Date  . Abdominal hysterectomy  1988  . Hemorrhoid surgery    . Myoectomy      SOCIAL HISTORY: Social History   Social History  . Marital Status: Divorced    Spouse Name: N/A  . Number of Children: N/A  . Years of Education: N/A   Occupational History  . Not on file.   Social History Main Topics  . Smoking status: Never Smoker   . Smokeless tobacco: Not on file  . Alcohol Use: No  . Drug Use: No  . Sexual Activity: Not Currently   Other Topics Concern  . 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 Colon polyps Father   . Prostate cancer Father   . Hypertension Sister   . Liver cancer Paternal Grandmother    indicated that her mother is alive. She indicated that her father is deceased. She indicated that both of her sisters are alive.   ALLERGIES:  has No Known Allergies.  MEDICATIONS:  Current Outpatient Prescriptions  Medication Sig Dispense Refill  . b complex vitamins tablet Take 1 tablet by mouth daily.    . benzonatate (TESSALON) 100 MG capsule Take 1 capsule (100 mg total) by mouth 2 (two) times daily as needed for cough. 14 capsule 0  . Cholecalciferol (VITAMIN D3) 1000 UNITS CAPS Take by mouth.      . clorazepate (TRANXENE) 7.5 MG tablet TAKE ONE TABLET BY MOUTH TWICE DAILY AS NEEDED 60 tablet 1  . fish oil-omega-3 fatty acids 1000  MG capsule Take 1 g by mouth daily.    Marland Kitchen GARLIC PO Take by mouth.    . hydrochlorothiazide (HYDRODIURIL) 25 MG tablet TAKE ONE TABLET BY MOUTH ONCE DAILY 90 tablet 0  . HYDROcodone-acetaminophen (NORCO/VICODIN) 5-325 MG tablet Take 1 tablet by mouth every 3 (three) hours as needed for moderate pain.    . Nutritional Supplements (NUTRITIONAL SUPPLEMENT PO) Take by mouth. Psyllium Husk    . Polyethylene Glycol 3350 (MIRALAX PO) Take by mouth.      . Potassium 99 MG TABS Take by mouth daily.      . predniSONE (STERAPRED UNI-PAK 21 TAB) 5 MG (21) TBPK tablet Take 1 tablet (5 mg total) by mouth as directed. Use as directed 21 tablet 0  . promethazine-dextromethorphan  (PROMETHAZINE-DM) 6.25-15 MG/5ML syrup One teaspoon at bedtime as needed , for excessive cough 118 mL 0  . ranitidine (ZANTAC) 150 MG capsule Take 150 mg by mouth every evening.    . sertraline (ZOLOFT) 50 MG tablet TAKE ONE TABLET BY MOUTH ONCE DAILY 30 tablet 2  . vitamin C (ASCORBIC ACID) 500 MG tablet Take 500 mg by mouth daily.     No current facility-administered medications for this visit.    Review of Systems  Constitutional: Negative.   HENT: Negative.   Eyes: Negative.   Respiratory: Negative.   Cardiovascular: Negative.   Gastrointestinal: Negative.  Negative for abdominal pain.  Genitourinary: Negative.   Musculoskeletal: Negative.        Rheumatism on right shoulder  Skin: Negative.   Neurological: Negative.   Endo/Heme/Allergies: Negative.   Psychiatric/Behavioral: Positive for depression.       Stressed due to taking care of her Mother Depressed because she doesn't like where she is living and not liking her life.   All other systems reviewed and are negative.  14 point ROS was done and is otherwise as detailed above or in HPI    PHYSICAL EXAMINATION: ECOG PERFORMANCE STATUS: 0 - Asymptomatic  Filed Vitals:   09/18/15 1300  BP: 131/72  Pulse: 78  Temp: 98.1 F (36.7 C)  Resp: 18   Filed Weights   09/18/15 1300  Weight: 180 lb 1.6 oz (81.693 kg)   Physical Exam  Constitutional: She is oriented to person, place, and time and well-developed, well-nourished, and in no distress.  HENT:  Head: Normocephalic and atraumatic.  Nose: Nose normal.  Mouth/Throat: Oropharynx is clear and moist. No oropharyngeal exudate.  Eyes: Conjunctivae and EOM are normal. Pupils are equal, round, and reactive to light. Right eye exhibits no discharge. Left eye exhibits no discharge. No scleral icterus.  Neck: Normal range of motion. Neck supple. No tracheal deviation present. No thyromegaly present.  Cardiovascular: Normal rate, regular rhythm and normal heart sounds.  Exam  reveals no gallop and no friction rub.   No murmur heard. Pulmonary/Chest: Effort normal and breath sounds normal. She has no wheezes. She has no rales.  Abdominal: Soft. Bowel sounds are normal. She exhibits no distension and no mass. There is no tenderness. There is no rebound and no guarding.  Musculoskeletal: Normal range of motion. She exhibits no edema.  Lymphadenopathy:    She has no cervical adenopathy.  Neurological: She is alert and oriented to person, place, and time. She has normal reflexes. No cranial nerve deficit. Gait normal. Coordination normal.  Skin: Skin is warm and dry. No rash noted.  Psychiatric: Mood, memory, affect and judgment normal.  Nursing note and vitals reviewed.   LABORATORY  DATA:  I have reviewed the data as listed Lab Results  Component Value Date   WBC 5.4 09/03/2015   HGB 13.5 09/03/2015   HCT 40.6 09/03/2015   MCV 92.9 09/03/2015   PLT 216 09/03/2015   CMP     Component Value Date/Time   NA 139 09/03/2015 1601   K 3.6 09/03/2015 1601   CL 102 09/03/2015 1601   CO2 31 09/03/2015 1601   GLUCOSE 96 09/03/2015 1601   BUN 9 09/03/2015 1601   CREATININE 0.82 09/03/2015 1601   CREATININE 0.76 09/25/2014 1620   CALCIUM 9.4 09/03/2015 1601   PROT 8.1 09/03/2015 1601   ALBUMIN 4.5 09/03/2015 1601   AST 19 09/03/2015 1601   ALT 16 09/03/2015 1601   ALKPHOS 48 09/03/2015 1601   BILITOT 0.8 09/03/2015 1601   GFRNONAA >60 09/03/2015 1601   GFRAA >60 09/03/2015 1601    Results for AUGUSTA, MIRKIN (MRN [id number]) as of 09/18/2015 16:09  Ref. Range 09/07/2015 10:15  % Beta Latest Units: % 0.0  M-SPIKE, % Latest Ref Range: Not Observed % Not Observed  NOTE: Unknown Comment  Immunofixation, Urine Unknown Comment  Total Protein, Urine-UPE24 Latest Ref Range: Not Estab. mg/dL 4.5  Total Protein, Urine-Ur/day Latest Ref Range: 30.0-150.0 mg/24 hr 77.6  ALBUMIN, U Latest Units: % 100.0  ALPHA 1 URINE Latest Units: % 0.0  Alpha 2, Urine Latest  Units: % 0.0  GAMMA GLOBULIN URINE Latest Units: % 0.0  Free Lt Chn Excr Rate Latest Ref Range: 1.35-24.19 mg/L 1.30 (L)  Free Lambda Lt Chains,Ur Latest Ref Range: 0.24-6.66 mg/L 0.07 (L)  Free Kappa/Lambda Ratio Latest Ref Range: 2.04-10.37  18.57 (H)  Immunofixation Result, Urine Unknown Comment      RADIOGRAPHIC STUDIES: I have personally reviewed the radiological images as listed and agreed with the findings in the report. Study Result     CLINICAL DATA: Right shoulder pain for several months  EXAM: RIGHT SHOULDER - 2+ VIEW  COMPARISON: None.  FINDINGS: There is mild arthritic change of the acromioclavicular joint. There is no fracture or dislocation. There are several oval areas of lucency measuring 2-3 mm in the humeral head and neck as well as in the scapula. These are nonspecific. In the appropriate clinical setting they could be related to myeloma but are by no means specific. Glenohumeral joint shows no significant degenerative change.  IMPRESSION: No acute finding   Electronically Signed  By: Skipper Cliche M.D.  On: 08/21/2015 17:26   CLINICAL DATA: Lytic bone lesions on x-ray. Possible myeloma. Right shoulder pain.  EXAM: METASTATIC BONE SURVEY  COMPARISON: Right shoulder radiographs 08/21/2015. Chest radiographs 05/19/2014. Cervical spine radiographs 12/08/2009.  FINDINGS: Tiny lucencies in the proximal right humerus and scapula on the prior shoulder radiographs are not as well demonstrated on the current examination. There is at most one similar lucency in the left humeral neck measuring 3 mm. No suspicious lytic or blastic osseous lesions are identified elsewhere in the imaged portion of the skeleton.  Hyperostosis frontalis is noted. Multilevel cervical disc degeneration is greatest at C6-7 where there is moderate disc space narrowing and anterior greater than posterior osteophytosis, similar to the prior study. Mild  thoracic and lumbar spondylosis is noted, and there are degenerative changes involving the SI joints. Chronic appearing sclerosis is noted about the SI joints and pubic symphysis. Transitional lumbosacral anatomy is noted with partial lumbarization of S1, and there is grade 1 anterolisthesis of L4 on L5 which is likely likely degenerative  and facet mediated.  The lungs demonstrate mild chronic interstitial coarsening without evidence of acute airspace consolidation, edema, pleural effusion, or pneumothorax. Cardiac silhouette is normal in size. Thoracic aortic calcification is noted.  IMPRESSION: No definite evidence of multiple myeloma or metastatic disease. Tiny, subtle lucencies in the right and possibly left proximal humeri and right scapula are not as well seen as on the prior shoulder radiographs.  ASSESSMENT & PLAN:  Right shoulder film on 08/21/2015 with oval areas of lucency in the humeral head and neck as well as in the scapula. R shoulder pain Abnormal kappa/lamda light chain ratio, serum Bence jones proteinuria  We discussed completion of her evaluation with BMBX. We discussed the procedure in detail, including risks, benefits and she is agreeable. Bone survey was reviewed with her. Labs from her prior visit were reviewed as well. She was provided with reading information on light chains/MGUS.   Orders Placed This Encounter  Procedures  . CT Biopsy    Standing Status: Future     Number of Occurrences:      Standing Expiration Date: 09/17/2016    Order Specific Question:  Lab orders requested (DO NOT place separate lab orders, these will be automatically ordered during procedure specimen collection):    Answer:  Surgical Pathology    Order Specific Question:  Reason for Exam (SYMPTOM  OR DIAGNOSIS REQUIRED)    Answer:  BMBX and aspirate, send flow cytometry, cytogenetics and FISH    Order Specific Question:  Preferred imaging location?    Answer:  [facility]     All questions were answered. The patient knows to call the clinic with any problems, questions or concerns.  This document serves as a record of services personally performed by Ancil Linsey, MD. It was created on her behalf by Toni Amend, a trained medical scribe. The creation of this record is based on the scribe's personal observations and the provider's statements to them. This document has been checked and approved by the attending provider.  I have reviewed the above documentation for accuracy and completeness, and I agree with the above.  This note was electronically signed.    Molli Hazard, MD  09/18/2015 4:10 PM

## 2015-09-19 ENCOUNTER — Other Ambulatory Visit: Payer: Self-pay | Admitting: Family Medicine

## 2015-09-24 ENCOUNTER — Other Ambulatory Visit: Payer: Self-pay | Admitting: Family Medicine

## 2015-09-29 ENCOUNTER — Ambulatory Visit: Payer: Self-pay | Admitting: Allergy and Immunology

## 2015-10-01 ENCOUNTER — Other Ambulatory Visit: Payer: Self-pay | Admitting: General Surgery

## 2015-10-02 ENCOUNTER — Ambulatory Visit (HOSPITAL_COMMUNITY)
Admission: RE | Admit: 2015-10-02 | Discharge: 2015-10-02 | Disposition: A | Discharge status: Home / Self Care | Payer: Medicare Other | Source: Ambulatory Visit | Attending: Hematology & Oncology | Admitting: Hematology & Oncology

## 2015-10-02 ENCOUNTER — Encounter (HOSPITAL_COMMUNITY): Payer: Self-pay

## 2015-10-02 DIAGNOSIS — F329 Major depressive disorder, single episode, unspecified: Secondary | ICD-10-CM | POA: Insufficient documentation

## 2015-10-02 DIAGNOSIS — D72822 Plasmacytosis: Secondary | ICD-10-CM | POA: Diagnosis not present

## 2015-10-02 DIAGNOSIS — Z90710 Acquired absence of both cervix and uterus: Secondary | ICD-10-CM | POA: Diagnosis not present

## 2015-10-02 DIAGNOSIS — M542 Cervicalgia: Secondary | ICD-10-CM | POA: Insufficient documentation

## 2015-10-02 DIAGNOSIS — B353 Tinea pedis: Secondary | ICD-10-CM | POA: Diagnosis not present

## 2015-10-02 DIAGNOSIS — F4323 Adjustment disorder with mixed anxiety and depressed mood: Secondary | ICD-10-CM | POA: Insufficient documentation

## 2015-10-02 DIAGNOSIS — Z808 Family history of malignant neoplasm of other organs or systems: Secondary | ICD-10-CM | POA: Diagnosis not present

## 2015-10-02 DIAGNOSIS — Z8042 Family history of malignant neoplasm of prostate: Secondary | ICD-10-CM | POA: Insufficient documentation

## 2015-10-02 DIAGNOSIS — D72820 Lymphocytosis (symptomatic): Secondary | ICD-10-CM | POA: Diagnosis not present

## 2015-10-02 DIAGNOSIS — Z8249 Family history of ischemic heart disease and other diseases of the circulatory system: Secondary | ICD-10-CM | POA: Insufficient documentation

## 2015-10-02 DIAGNOSIS — R803 Bence Jones proteinuria: Secondary | ICD-10-CM | POA: Insufficient documentation

## 2015-10-02 DIAGNOSIS — R0989 Other specified symptoms and signs involving the circulatory and respiratory systems: Secondary | ICD-10-CM | POA: Insufficient documentation

## 2015-10-02 DIAGNOSIS — K219 Gastro-esophageal reflux disease without esophagitis: Secondary | ICD-10-CM | POA: Diagnosis not present

## 2015-10-02 DIAGNOSIS — Z79899 Other long term (current) drug therapy: Secondary | ICD-10-CM | POA: Insufficient documentation

## 2015-10-02 DIAGNOSIS — I10 Essential (primary) hypertension: Secondary | ICD-10-CM | POA: Insufficient documentation

## 2015-10-02 DIAGNOSIS — M810 Age-related osteoporosis without current pathological fracture: Secondary | ICD-10-CM | POA: Insufficient documentation

## 2015-10-02 DIAGNOSIS — Z8719 Personal history of other diseases of the digestive system: Secondary | ICD-10-CM | POA: Insufficient documentation

## 2015-10-02 DIAGNOSIS — G4700 Insomnia, unspecified: Secondary | ICD-10-CM | POA: Insufficient documentation

## 2015-10-02 DIAGNOSIS — Z8371 Family history of colonic polyps: Secondary | ICD-10-CM | POA: Diagnosis not present

## 2015-10-02 LAB — CBC
HCT: 39 % (ref 36.0–46.0)
Hemoglobin: 13.2 g/dL (ref 12.0–15.0)
MCH: 31.1 pg (ref 26.0–34.0)
MCHC: 33.8 g/dL (ref 30.0–36.0)
MCV: 92 fL (ref 78.0–100.0)
Platelets: 224 10*3/uL (ref 150–400)
RBC: 4.24 MIL/uL (ref 3.87–5.11)
RDW: 12.8 % (ref 11.5–15.5)
WBC: 4.7 10*3/uL (ref 4.0–10.5)

## 2015-10-02 LAB — PROTIME-INR
INR: 0.94 (ref 0.00–1.49)
Prothrombin Time: 12.8 seconds (ref 11.6–15.2)

## 2015-10-02 LAB — APTT: aPTT: 32 seconds (ref 24–37)

## 2015-10-02 LAB — BONE MARROW EXAM: Bone Marrow Exam: 270

## 2015-10-02 MED ORDER — FENTANYL CITRATE (PF) 100 MCG/2ML IJ SOLN
INTRAMUSCULAR | Status: AC | PRN
  Administered 2015-10-02 (×2): 25 ug via INTRAVENOUS
  Administered 2015-10-02: 50 ug via INTRAVENOUS

## 2015-10-02 MED ORDER — FENTANYL CITRATE (PF) 100 MCG/2ML IJ SOLN
INTRAMUSCULAR | Status: AC
  Filled 2015-10-02: qty 4

## 2015-10-02 MED ORDER — HYDROCODONE-ACETAMINOPHEN 5-325 MG PO TABS
1.0000 | ORAL_TABLET | ORAL | Status: DC | PRN
  Filled 2015-10-02: qty 2

## 2015-10-02 MED ORDER — MIDAZOLAM HCL 2 MG/2ML IJ SOLN
INTRAMUSCULAR | Status: AC | PRN
  Administered 2015-10-02: 2 mg via INTRAVENOUS
  Administered 2015-10-02 (×2): 1 mg via INTRAVENOUS

## 2015-10-02 MED ORDER — MIDAZOLAM HCL 2 MG/2ML IJ SOLN
INTRAMUSCULAR | Status: AC
  Filled 2015-10-02: qty 6

## 2015-10-02 MED ORDER — SODIUM CHLORIDE 0.9 % IV SOLN: INTRAVENOUS | Status: DC

## 2015-10-02 NOTE — H&P (Signed)
Chief Complaint: possible myeloma  Referring Physician:Dr. Ancil Linsey  Supervising Physician: Daryll Brod  HPI: Tiffany Mendoza is an 65 y.o. female who was found to have a lucency in her humeral head and neck as well as her scapula.  There was a concern for possible myeloma.  She was sent to Dr. Whitney Muse for evaluation.  She felt as though the patient would benefit from a BMBX and aspiration.  The patient presents today for this procedure.  She has otherwise been feeling well and in her normal state of health.  Past Medical History:  Past Medical History  Diagnosis Date  . Allergy   . Neck pain   . Insomnia   . Carotid bruit   . Dermatophytosis of foot   . Osteoporosis   . Adjustment disorder with mixed anxiety and depressed mood   . Depression   . Hypertension   . GERD (gastroesophageal reflux disease)     Past Surgical History:  Past Surgical History  Procedure Laterality Date  . Abdominal hysterectomy  1988  . Hemorrhoid surgery    . Myoectomy      Family History:  Family History  Problem Relation Age of Onset  . Colon polyps Father   . Prostate cancer Father   . Hypertension Sister   . Liver cancer Paternal Grandmother     Social History:  reports that she has never smoked. She does not have any smokeless tobacco history on file. She reports that she does not drink alcohol or use illicit drugs.  Allergies: No Known Allergies  Medications:   Medication List    ASK your doctor about these medications        b complex vitamins tablet  Take 1 tablet by mouth daily.     benzonatate 100 MG capsule  Commonly known as:  TESSALON  Take 1 capsule (100 mg total) by mouth 2 (two) times daily as needed for cough.     clorazepate 7.5 MG tablet  Commonly known as:  TRANXENE  TAKE ONE TABLET BY MOUTH TWICE DAILY AS NEEDED     fish oil-omega-3 fatty acids 1000 MG capsule  Take 1 g by mouth daily.     GARLIC PO  Take by mouth.     hydrochlorothiazide 25 MG tablet  Commonly known as:  HYDRODIURIL  TAKE ONE TABLET BY MOUTH ONCE DAILY     HYDROcodone-acetaminophen 5-325 MG tablet  Commonly known as:  NORCO/VICODIN  Take 1 tablet by mouth every 3 (three) hours as needed for moderate pain.     MIRALAX PO  Take by mouth.     NUTRITIONAL SUPPLEMENT PO  Take by mouth. Psyllium Husk     Potassium 99 MG Tabs  Take by mouth daily.     predniSONE 5 MG (21) Tbpk tablet  Commonly known as:  STERAPRED UNI-PAK 21 TAB  Take 1 tablet (5 mg total) by mouth as directed. Use as directed     promethazine-dextromethorphan 6.25-15 MG/5ML syrup  Commonly known as:  PROMETHAZINE-DM  One teaspoon at bedtime as needed , for excessive cough     ranitidine 150 MG capsule  Commonly known as:  ZANTAC  Take 150 mg by mouth every evening.     sertraline 50 MG tablet  Commonly known as:  ZOLOFT  TAKE ONE TABLET BY MOUTH ONCE DAILY     vitamin C 500 MG tablet  Commonly known as:  ASCORBIC ACID  Take 500 mg by mouth daily.  Vitamin D3 1000 units Caps  Take by mouth.        Please HPI for pertinent positives, otherwise complete 10 system ROS negative.  Mallampati Score: MD Evaluation Airway: WNL Heart: WNL Abdomen: WNL Chest/ Lungs: WNL ASA  Classification: 3 Mallampati/Airway Score: Two  Physical Exam: BP 147/92 mmHg  Pulse 71  Temp(Src) 97.8 F (36.6 C) (Oral)  Resp 16  Ht 5' 7"  (1.702 m)  Wt 179 lb (81.194 kg)  BMI 28.03 kg/m2  SpO2 98% Body mass index is 28.03 kg/(m^2). General: pleasant, WD, WN black female who is laying in bed in NAD HEENT: head is normocephalic, atraumatic.  Sclera are noninjected.  PERRL.  Ears and nose without any masses or lesions.  Mouth is pink and moist Heart: regular, rate, and rhythm.  Normal s1,s2. No obvious murmurs, gallops, or rubs noted.  Palpable radial and pedal pulses bilaterally Lungs: CTAB, no wheezes, rhonchi, or rales noted.  Respiratory effort nonlabored Abd: soft,  NT, ND, +BS, no masses, hernias, or organomegaly MS: all 4 extremities are symmetrical with no cyanosis, clubbing, or edema. Psych: A&Ox3 with an appropriate affect.   Labs: Results for orders placed or performed during the hospital encounter of 10/02/15 (from the past 48 hour(s))  APTT upon arrival     Status: None   Collection Time: 10/02/15  9:25 AM  Result Value Ref Range   aPTT 32 24 - 37 seconds  CBC upon arrival     Status: None   Collection Time: 10/02/15  9:25 AM  Result Value Ref Range   WBC 4.7 4.0 - 10.5 K/uL   RBC 4.24 3.87 - 5.11 MIL/uL   Hemoglobin 13.2 12.0 - 15.0 g/dL   HCT 39.0 36.0 - 46.0 %   MCV 92.0 78.0 - 100.0 fL   MCH 31.1 26.0 - 34.0 pg   MCHC 33.8 30.0 - 36.0 g/dL   RDW 12.8 11.5 - 15.5 %   Platelets 224 150 - 400 K/uL  Protime-INR upon arrival     Status: None   Collection Time: 10/02/15  9:25 AM  Result Value Ref Range   Prothrombin Time 12.8 11.6 - 15.2 seconds   INR 0.94 0.00 - 1.49    Imaging: No results found.  Assessment/Plan 1. Lucency of humerus, neck and scapula -will proceed with bone marrow biopsy today with aspiration. -labs and vitals have been reviewed -Risks and Benefits discussed with the patient including, but not limited to bleeding, infection, damage to adjacent structures or low yield requiring additional tests. All of the patient's questions were answered, patient is agreeable to proceed. Consent signed and in chart.     Thank you for this interesting consult.  I greatly enjoyed meeting SADAKO CEGIELSKI and look forward to participating in their care.  A copy of this report was sent to the requesting provider on this date.  Electronically Signed: Henreitta Cea 10/02/2015, 10:25 AM   I spent a total of  30 Minutes   in face to face in clinical consultation, greater than 50% of which was counseling/coordinating care for lesions on humerus, neck, and scapula, rule out myeloma

## 2015-10-02 NOTE — Sedation Documentation (Signed)
Patient denies pain and is resting comfortably.  

## 2015-10-02 NOTE — Procedures (Signed)
Successful RT ILIAC BM ASP AND CORE BX No comp Stable Path pending Full report in PACS

## 2015-10-02 NOTE — Discharge Instructions (Signed)
Bone Marrow Aspiration and Bone Marrow Biopsy, Care After °Refer to this sheet in the next few weeks. These instructions provide you with information about caring for yourself after your procedure. Your health care provider may also give you more specific instructions. Your treatment has been planned according to current medical practices, but problems sometimes occur. Call your health care provider if you have any problems or questions after your procedure. °WHAT TO EXPECT AFTER THE PROCEDURE °After your procedure, it is common to have: °· Soreness or tenderness around the puncture site. °· Bruising. °HOME CARE INSTRUCTIONS °· Take medicines only as directed by your health care provider. °· Follow your health care provider's instructions about: °¨ Puncture site care. °¨ Bandage (dressing) changes and removal. °· Bathe and shower as directed by your health care provider. °· Check your puncture site every day for signs of infection. Watch for: °¨ Redness, swelling, or pain. °¨ Fluid, blood, or pus. °· Return to your normal activities as directed by your health care provider. °· Keep all follow-up visits as directed by your health care provider. This is important. °SEEK MEDICAL CARE IF: °· You have a fever. °· You have uncontrollable bleeding. °· You have redness, swelling, or pain at the site of your puncture. °· You have fluid, blood, or pus coming from your puncture site. °  °This information is not intended to replace advice given to you by your health care provider. Make sure you discuss any questions you have with your health care provider. °  °Document Released: 12/24/2004 Document Revised: 10/21/2014 Document Reviewed: 05/28/2014 °Elsevier Interactive Patient Education ©2016 Elsevier Inc. °Moderate Conscious Sedation, Adult, Care After °Refer to this sheet in the next few weeks. These instructions provide you with information on caring for yourself after your procedure. Your health care provider may also give  you more specific instructions. Your treatment has been planned according to current medical practices, but problems sometimes occur. Call your health care provider if you have any problems or questions after your procedure. °WHAT TO EXPECT AFTER THE PROCEDURE  °After your procedure: °· You may feel sleepy, clumsy, and have poor balance for several hours. °· Vomiting may occur if you eat too soon after the procedure. °HOME CARE INSTRUCTIONS °· Do not participate in any activities where you could become injured for at least 24 hours. Do not: °¨ Drive. °¨ Swim. °¨ Ride a bicycle. °¨ Operate heavy machinery. °¨ Cook. °¨ Use power tools. °¨ Climb ladders. °¨ Work from a high place. °· Do not make important decisions or sign legal documents until you are improved. °· If you vomit, drink water, juice, or soup when you can drink without vomiting. Make sure you have little or no nausea before eating solid foods. °· Only take over-the-counter or prescription medicines for pain, discomfort, or fever as directed by your health care provider. °· Make sure you and your family fully understand everything about the medicines given to you, including what side effects may occur. °· You should not drink alcohol, take sleeping pills, or take medicines that cause drowsiness for at least 24 hours. °· If you smoke, do not smoke without supervision. °· If you are feeling better, you may resume normal activities 24 hours after you were sedated. °· Keep all appointments with your health care provider. °SEEK MEDICAL CARE IF: °· Your skin is pale or bluish in color. °· You continue to feel nauseous or vomit. °· Your pain is getting worse and is not helped by medicine. °·   You have bleeding or swelling. °· You are still sleepy or feeling clumsy after 24 hours. °SEEK IMMEDIATE MEDICAL CARE IF: °· You develop a rash. °· You have difficulty breathing. °· You develop any type of allergic problem. °· You have a fever. °MAKE SURE YOU: °· Understand  these instructions. °· Will watch your condition. °· Will get help right away if you are not doing well or get worse. °  °This information is not intended to replace advice given to you by your health care provider. Make sure you discuss any questions you have with your health care provider. °  °Document Released: 03/27/2013 Document Revised: 06/27/2014 Document Reviewed: 03/27/2013 °Elsevier Interactive Patient Education ©2016 Elsevier Inc. ° ° °

## 2015-10-16 LAB — CHROMOSOME ANALYSIS, BONE MARROW

## 2015-10-16 LAB — TISSUE HYBRIDIZATION (BONE MARROW)-NCBH

## 2015-10-21 ENCOUNTER — Encounter (HOSPITAL_COMMUNITY): Payer: Self-pay | Admitting: Hematology & Oncology

## 2015-10-21 ENCOUNTER — Encounter (HOSPITAL_COMMUNITY): Payer: Medicare Other | Attending: Hematology & Oncology | Admitting: Hematology & Oncology

## 2015-10-21 VITALS — BP 119/82 | HR 82 | Temp 98.1°F | Resp 18 | Wt 175.0 lb

## 2015-10-21 DIAGNOSIS — Z90710 Acquired absence of both cervix and uterus: Secondary | ICD-10-CM | POA: Insufficient documentation

## 2015-10-21 DIAGNOSIS — I10 Essential (primary) hypertension: Secondary | ICD-10-CM | POA: Insufficient documentation

## 2015-10-21 DIAGNOSIS — M25511 Pain in right shoulder: Secondary | ICD-10-CM | POA: Insufficient documentation

## 2015-10-21 DIAGNOSIS — M898X9 Other specified disorders of bone, unspecified site: Secondary | ICD-10-CM

## 2015-10-21 DIAGNOSIS — D72822 Plasmacytosis: Secondary | ICD-10-CM | POA: Diagnosis not present

## 2015-10-21 DIAGNOSIS — K219 Gastro-esophageal reflux disease without esophagitis: Secondary | ICD-10-CM | POA: Insufficient documentation

## 2015-10-21 DIAGNOSIS — R803 Bence Jones proteinuria: Secondary | ICD-10-CM

## 2015-10-21 DIAGNOSIS — F329 Major depressive disorder, single episode, unspecified: Secondary | ICD-10-CM | POA: Insufficient documentation

## 2015-10-21 DIAGNOSIS — Z79899 Other long term (current) drug therapy: Secondary | ICD-10-CM | POA: Insufficient documentation

## 2015-10-21 DIAGNOSIS — Z8042 Family history of malignant neoplasm of prostate: Secondary | ICD-10-CM | POA: Insufficient documentation

## 2015-10-21 DIAGNOSIS — M899 Disorder of bone, unspecified: Secondary | ICD-10-CM

## 2015-10-21 NOTE — Patient Instructions (Signed)
[place] at [facility] Discharge Instructions  RECOMMENDATIONS MADE BY THE CONSULTANT AND ANY TEST RESULTS WILL BE SENT TO YOUR REFERRING PHYSICIAN.   Exam and discussion by Dr Whitney Muse today Labs in 6 months Return to see the doctor in 6 months Please call the clinic if you have any questions or concerns      Thank you for choosing [place] at [facility] to provide your oncology and hematology care.  To afford each patient quality time with our provider, please arrive at least 15 minutes before your scheduled appointment time.   Beginning January 23rd 2017 lab work for the [facility] will be done in the  Main lab at [facility] on 1st floor. If you have a lab appointment with the [facility] please come in thru the  Main Entrance and check in at the main information desk  You need to re-schedule your appointment should you arrive 10 or more minutes late.  We strive to give you quality time with our providers, and arriving late affects you and other patients whose appointments are after yours.  Also, if you no show three or more times for appointments you may be dismissed from the clinic at the providers discretion.     Again, thank you for choosing [facility].  Our hope is that these requests will decrease the amount of time that you wait before being seen by our physicians.       _____________________________________________________________  Should you have questions after your visit to [facility], please contact our office at [phone] between the hours of 8:30 a.m. and 4:30 p.m.  Voicemails left after 4:30 p.m. will not be returned until the following business day.  For prescription refill requests, have your pharmacy contact our office.         Resources For Cancer Patients and their Caregivers ? American Cancer Society: Can assist with transportation, wigs, general needs, runs Look Good  Feel Better.        786-749-7304 ? Cancer Care: Provides financial assistance, online support groups, medication/co-pay assistance.  1-800-813-HOPE 405-812-0081) ? [place] Assists [place] cancer patients and their families through emotional , educational and financial support.  (818)037-2897 ? [place] DSS Where to apply for food stamps, Medicaid and utility assistance. 407-218-1249 ? [facility]: Transportation to medical appointments. (718) 613-3310 ? Social Security Administration: May apply for disability if have a Stage IV cancer. (831)843-9477 (214)655-7435 ? [employer], Disability and Transit Services: Assists with nutrition, care and transit needs. 959-517-9274

## 2015-10-21 NOTE — Progress Notes (Signed)
[place] at [place] Note  Patient Care Team: Fayrene Helper, MD as PCP - General W Evette Cristal, MD as Consulting Physician (Obstetrics and Gynecology) Rutherford Guys, MD as Consulting Physician (Ophthalmology)  CHIEF COMPLAINTS/PURPOSE OF CONSULTATION:  Right shoulder film on 08/21/2015 with oval areas of lucency in the humeral head and neck as well as in the scapula.  HISTORY OF PRESENTING ILLNESS:  Tiffany Mendoza 65 y.o. female is here because of abnormal imaging of the shoulder. Tiffany Mendoza was here alone today. She is here to review recent BMBX.  She is the primary caregiver for her mother. She says that she has to do a lot for her and in the past 6 months she has gotten more combative. Her mother forgets a lot of things and seems to be in denial about her situation. The patient lives with her mother.  Tiffany Mendoza is accompanied by her mother today. I personally reviewed and went over laboratory studies and BMBX with the patient at length.  She believes her symptoms may be stress related, noting her arm did not hurt until she visited our clinic.   The patient has chronic medical problems including bronchitis. She does not get chronic bronchitis often and has not had it since 2010. She recently recovered from laryngitis.    MEDICAL HISTORY:  Past Medical History  Diagnosis Date  . Allergy   . Neck pain   . Insomnia   . Carotid bruit   . Dermatophytosis of foot   . Osteoporosis   . Adjustment disorder with mixed anxiety and depressed mood   . Depression   . Hypertension   . GERD (gastroesophageal reflux disease)     SURGICAL HISTORY: Past Surgical History  Procedure Laterality Date  . Abdominal hysterectomy  1988  . Hemorrhoid surgery    . Myoectomy      SOCIAL HISTORY: Social History   Social History  . Marital Status: Divorced    Spouse Name: N/A  . Number of Children: N/A  . Years of Education: N/A   Occupational History   . Not on file.   Social History Main Topics  . Smoking status: Never Smoker   . Smokeless tobacco: Not on file  . Alcohol Use: No  . Drug Use: No  . Sexual Activity: Not Currently   Other Topics Concern  . 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 Colon polyps Father   . Prostate cancer Father   . Hypertension Sister   . Liver cancer Paternal Grandmother    indicated that her mother is alive. She indicated that her father is deceased. She indicated that both of her sisters are alive.   ALLERGIES:  has No Known Allergies.  MEDICATIONS:  Current Outpatient Prescriptions  Medication Sig Dispense Refill  . Cholecalciferol (VITAMIN D3) 1000 UNITS CAPS Take by mouth.      . clorazepate (TRANXENE) 7.5 MG tablet TAKE ONE TABLET BY MOUTH TWICE DAILY AS NEEDED  60 tablet 2  . fish oil-omega-3 fatty acids 1000 MG capsule Take 1 g by mouth daily.    Marland Kitchen GARLIC PO Take by mouth.    . hydrochlorothiazide (HYDRODIURIL) 25 MG tablet TAKE ONE TABLET BY MOUTH ONCE DAILY 90 tablet 0  . Nutritional Supplements (NUTRITIONAL SUPPLEMENT PO) Take by mouth. Psyllium Husk    . Polyethylene Glycol 3350 (MIRALAX PO) Take by mouth.      . Potassium 99 MG TABS Take by mouth daily.      . ranitidine (ZANTAC) 150 MG capsule Take 150 mg by mouth every evening.    . sertraline (ZOLOFT) 50 MG tablet TAKE ONE TABLET BY MOUTH ONCE DAILY 30 tablet 2  . vitamin C (ASCORBIC ACID) 500 MG tablet Take 500 mg by mouth daily.    Marland Kitchen b complex  vitamins tablet Take 1 tablet by mouth daily. Reported on 10/21/2015    . promethazine-dextromethorphan (PROMETHAZINE-DM) 6.25-15 MG/5ML syrup One teaspoon at bedtime as needed , for excessive cough (Patient not taking: Reported on 10/21/2015) 118 mL 0   No current facility-administered medications for this visit.    Review of Systems  Constitutional: Negative.   HENT: Negative.   Eyes: Negative.   Respiratory: Negative.   Cardiovascular: Negative.   Gastrointestinal: Negative.  Negative for abdominal pain.  Genitourinary: Negative.   Musculoskeletal: Negative.        Rheumatism on right shoulder  Skin: Negative.   Neurological: Negative.   Endo/Heme/Allergies: Negative.   Psychiatric/Behavioral: Positive for depression.       Stress  All other systems reviewed and are negative.  14 point ROS was done and is otherwise as detailed above or in HPI   PHYSICAL EXAMINATION: ECOG PERFORMANCE STATUS: 0 - Asymptomatic  Filed Vitals:   10/21/15 1500  BP: 119/82  Pulse: 82  Temp: 98.1 F (36.7 C)  Resp: 18   Filed Weights   10/21/15 1500  Weight: 175 lb (79.379 kg)     Physical Exam  Constitutional: She is oriented to person, place, and time and well-developed, well-nourished, and in no distress.  HENT:  Head: Normocephalic and atraumatic.  Nose: Nose normal.  Mouth/Throat: Oropharynx is clear and moist. No oropharyngeal exudate.  Eyes: Conjunctivae and EOM are normal. Pupils are equal, round, and reactive to light. Right eye exhibits no discharge. Left eye exhibits no discharge. No scleral icterus.  Neck: Normal range of motion. Neck supple. No tracheal deviation present. No thyromegaly present.  Cardiovascular: Normal rate, regular rhythm and normal heart sounds.  Exam reveals no gallop and no friction rub.   No murmur heard. Pulmonary/Chest: Effort normal and breath sounds normal. She has no wheezes. She has no rales.  Abdominal: Soft. Bowel sounds are normal. She exhibits  no distension and no mass. There is no tenderness. There is no rebound and no guarding.  Musculoskeletal: Normal range of motion. She exhibits no edema.  Lymphadenopathy:    She has no cervical adenopathy.  Neurological: She is alert and oriented to person, place, and time. She has normal reflexes. No cranial nerve deficit. Gait normal. Coordination normal.  Skin: Skin is warm and dry. No rash noted.  Psychiatric: Mood, memory, affect and judgment normal.  Nursing note and vitals reviewed.   LABORATORY DATA:  I have reviewed the data as listed Lab Results  Component Value Date   WBC 4.7 10/02/2015   HGB 13.2 10/02/2015   HCT 39.0 10/02/2015   MCV 92.0 10/02/2015   PLT 224 10/02/2015  RADIOGRAPHIC STUDIES: I have personally reviewed the radiological images as listed and agreed with the findings in the report. Study Result     CLINICAL DATA: Right shoulder pain for several months  EXAM: RIGHT SHOULDER - 2+ VIEW  COMPARISON: None.  FINDINGS: There is mild arthritic change of the acromioclavicular joint. There is no fracture or dislocation. There are several oval areas of lucency measuring 2-3 mm in the humeral head and neck as well as in the scapula. These are nonspecific. In the appropriate clinical setting they could be related to myeloma but are by no means specific. Glenohumeral joint shows no significant degenerative change.  IMPRESSION: No acute finding   Electronically Signed  By: Skipper Cliche M.D.  On: 08/21/2015 17:26   PATHOLOGY    ASSESSMENT & PLAN:  Right shoulder film on 08/21/2015 with oval areas of lucency in the humeral head and neck as well as in the scapula. R shoulder pain Abnormal kappa/lamda light chain ratio, serum Bence jones proteinuria BMBX 10/06/2015 trilineage hematopoiesis, plasmacytosis 6%, polyclonal    I reviewed her BMBX in detail. 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  I advised her that there is no evidence of myeloma.   The patient was given a copy of these results.  She will return in 6 months for follow up with labs. If these labs results come back stable, we will consider discharge.   Orders Placed This Encounter  Procedures  . CBC with Differential    Standing Status: Future     Number of Occurrences:      Standing Expiration Date: 10/20/2016  . Comprehensive metabolic panel    Standing Status: Future     Number of Occurrences:      Standing Expiration Date: 10/20/2016    All questions were answered. The patient knows to call the clinic with any problems, questions or concerns.  This document serves as a record of services personally performed by Ancil Linsey, MD. It was created on her behalf by Arlyce Harman, a trained medical scribe. The creation of this record is based on the scribe's personal observations and the provider's statements to them. This document has been checked and approved by the attending provider.  I have reviewed the above documentation for accuracy and completeness, and I agree with the above.  This note was electronically signed.    Molli Hazard, MD  11/07/2015 1:59 PM

## 2015-10-22 ENCOUNTER — Encounter (HOSPITAL_COMMUNITY): Payer: Self-pay

## 2015-10-23 ENCOUNTER — Encounter (HOSPITAL_COMMUNITY): Payer: Self-pay | Admitting: Hematology & Oncology

## 2015-11-19 ENCOUNTER — Ambulatory Visit (INDEPENDENT_AMBULATORY_CARE_PROVIDER_SITE_OTHER): Payer: Medicare Other | Admitting: Otolaryngology

## 2015-11-19 DIAGNOSIS — R490 Dysphonia: Secondary | ICD-10-CM

## 2015-12-17 ENCOUNTER — Encounter: Payer: Self-pay | Admitting: Family Medicine

## 2015-12-17 ENCOUNTER — Ambulatory Visit (INDEPENDENT_AMBULATORY_CARE_PROVIDER_SITE_OTHER): Payer: Medicare Other | Admitting: Family Medicine

## 2015-12-17 VITALS — BP 134/82 | HR 78 | Resp 16 | Ht 67.0 in | Wt 172.0 lb

## 2015-12-17 DIAGNOSIS — E663 Overweight: Secondary | ICD-10-CM

## 2015-12-17 DIAGNOSIS — K219 Gastro-esophageal reflux disease without esophagitis: Secondary | ICD-10-CM

## 2015-12-17 DIAGNOSIS — I10 Essential (primary) hypertension: Secondary | ICD-10-CM | POA: Diagnosis not present

## 2015-12-17 DIAGNOSIS — E559 Vitamin D deficiency, unspecified: Secondary | ICD-10-CM | POA: Diagnosis not present

## 2015-12-17 DIAGNOSIS — F418 Other specified anxiety disorders: Secondary | ICD-10-CM

## 2015-12-17 DIAGNOSIS — R7301 Impaired fasting glucose: Secondary | ICD-10-CM | POA: Diagnosis not present

## 2015-12-17 DIAGNOSIS — E785 Hyperlipidemia, unspecified: Secondary | ICD-10-CM

## 2015-12-17 DIAGNOSIS — Z0000 Encounter for general adult medical examination without abnormal findings: Secondary | ICD-10-CM

## 2015-12-17 NOTE — Patient Instructions (Addendum)
F/u in 4.5 month, call if you need me before  Non fast chem 7, HBA1C  Fasting chol, tsh, chem 7 and vit D Nov 8 or after  congrats on improved weight   Need mammogram, pls schedule and stool test  Thank you  for choosing [facility]. We consider it a privelige to serve you.  Delivering excellent health care in a caring and  compassionate way is our goal.  Partnering with you,  so that together we can achieve this goal is our strategy.

## 2015-12-18 LAB — BASIC METABOLIC PANEL
BUN: 8 mg/dL (ref 7–25)
CO2: 28 mmol/L (ref 20–31)
Calcium: 9.6 mg/dL (ref 8.6–10.4)
Chloride: 102 mmol/L (ref 98–110)
Creat: 0.78 mg/dL (ref 0.50–0.99)
Glucose, Bld: 95 mg/dL (ref 65–99)
Potassium: 3.7 mmol/L (ref 3.5–5.3)
Sodium: 139 mmol/L (ref 135–146)

## 2015-12-18 LAB — HEMOGLOBIN A1C
Hgb A1c MFr Bld: 5.7 % — ABNORMAL HIGH (ref <5.7)
Mean Plasma Glucose: 117 mg/dL

## 2015-12-20 NOTE — Assessment & Plan Note (Signed)
Improved, trial of living on her won, showed her that best for her Mom to be in assisted living facility where  She can get the care that she needs, states one of her 2 sisters is working with her on this

## 2015-12-20 NOTE — Assessment & Plan Note (Signed)
Improved. Pt applauded on succesful weight loss through lifestyle change, and encouraged to continue same. Weight loss goal set for the next several months.  

## 2015-12-20 NOTE — Progress Notes (Signed)
Subjective:    Patient ID: Tiffany Mendoza, female    DOB: 1951/06/01, 65 y.o.   MRN: [id number]  HPI   Tiffany Mendoza     MRN: [id number]      DOB: June 28, 1950   HPI Tiffany Mendoza is here for follow up and re-evaluation of chronic medical conditions, medication management and review of any available recent lab and radiology data.  Preventive health is updated, specifically  Cancer screening and Immunization.   Questions or concerns regarding consultations or procedures which the PT has had in the interim are  addressed. The PT denies any adverse reactions to current medications since the last visit.  There are no new concerns.  There are no specific complaints   ROS Denies recent fever or chills. Denies sinus pressure, nasal congestion, ear pain or sore throat. Denies chest congestion, productive cough or wheezing. Denies chest pains, palpitations and leg swelling Denies abdominal pain, nausea, vomiting,diarrhea or constipation.   Denies dysuria, frequency, hesitancy or incontinence. Denies joint pain, swelling and limitation in mobility. Denies headaches, seizures, numbness, or tingling. Denies depression, anxiety or insomnia. Denies skin break down or rash.   PE  BP 134/82 mmHg  Pulse 78  Resp 16  Ht 5\' 7"  (1.702 m)  Wt 172 lb (78.019 kg)  BMI 26.93 kg/m2  SpO2 99%  Patient alert and oriented and in no cardiopulmonary distress.  HEENT: No facial asymmetry, EOMI,   oropharynx pink and moist.  Neck supple no JVD, no mass.  Chest: Clear to auscultation bilaterally.  CVS: S1, S2 no murmurs, no S3.Regular rate.  ABD: Soft non tender.   Ext: No edema  MS: Adequate ROM spine, shoulders, hips and knees.  Skin: Intact, no ulcerations or rash noted.  Psych: Good eye contact, normal affect. Memory intact not anxious or depressed appearing.  CNS: CN 2-12 intact, power,  normal throughout.no focal deficits noted.   Assessment & Plan   Essential  hypertension Controlled, no change in medication DASH diet and commitment to daily physical activity for a minimum of 30 minutes discussed and encouraged, as a part of hypertension management. The importance of attaining a healthy weight is also discussed.  BP/Weight 12/17/2015 10/21/2015 10/02/2015 09/18/2015 09/09/2015 09/03/2015 99991111  Systolic BP Q000111Q 123456 [id number] [id number]  Diastolic BP 82 82 76 72 76 76 82  Wt. (Lbs) 172 175 179 180.1 176 176.6 178  BMI 26.93 27.4 28.03 28.2 27.56 27.24 27.45        GERD Improved, uses H2 blocker, as needed  Depression with anxiety Improved, trial of living on her won, showed her that best for her Mom to be in assisted living facility where  She can get the care that she needs, states one of her 2 sisters is working with her on this  Overweight (BMI 25.0-29.9) Improved. Pt applauded on succesful weight loss through lifestyle change, and encouraged to continue same. Weight loss goal set for the next several months.   IFG (impaired fasting glucose) Patient educated about the importance of limiting  Carbohydrate intake , the need to commit to daily physical activity for a minimum of 30 minutes , and to commit weight loss. The fact that changes in all these areas will reduce or eliminate all together the development of diabetes is stressed.  Improved  Diabetic Labs Latest Ref Rng 12/17/2015 09/03/2015 04/28/2015 09/25/2014 03/27/2014  HbA1c <5.7 % 5.7(H) - 5.8(H) - -  Chol 125 - 200 mg/dL - - 148 169 -  HDL >=46 mg/dL - - 56 50 -  Calc LDL <130 mg/dL - - 77 100(H) -  Triglycerides <150 mg/dL - - 75 96 -  Creatinine 0.50 - 0.99 mg/dL 0.78 0.82 - 0.76 0.74   BP/Weight 12/17/2015 10/21/2015 10/02/2015 09/18/2015 09/09/2015 09/03/2015 99991111  Systolic BP Q000111Q 123456 [id number] [id number] [id number]  Diastolic BP 82 82 76 72 76 76 82  Wt. (Lbs) 172 175 179 180.1 176 176.6 178  BMI 26.93 27.4 28.03 28.2 27.56 27.24 27.45   No flowsheet data  found.         Review of Systems     Objective:   Physical Exam        Assessment & Plan:

## 2015-12-20 NOTE — Assessment & Plan Note (Signed)
Patient educated about the importance of limiting  Carbohydrate intake , the need to commit to daily physical activity for a minimum of 30 minutes , and to commit weight loss. The fact that changes in all these areas will reduce or eliminate all together the development of diabetes is stressed.  Improved  Diabetic Labs Latest Ref Rng 12/17/2015 09/03/2015 04/28/2015 09/25/2014 03/27/2014  HbA1c <5.7 % 5.7(H) - 5.8(H) - -  Chol 125 - 200 mg/dL - - 148 169 -  HDL >=46 mg/dL - - 56 50 -  Calc LDL <130 mg/dL - - 77 100(H) -  Triglycerides <150 mg/dL - - 75 96 -  Creatinine 0.50 - 0.99 mg/dL 0.78 0.82 - 0.76 0.74   BP/Weight 12/17/2015 10/21/2015 10/02/2015 09/18/2015 09/09/2015 09/03/2015 99991111  Systolic BP Q000111Q 123456 [id number] [id number] [id number]  Diastolic BP 82 82 76 72 76 76 82  Wt. (Lbs) 172 175 179 180.1 176 176.6 178  BMI 26.93 27.4 28.03 28.2 27.56 27.24 27.45   No flowsheet data found.

## 2015-12-20 NOTE — Assessment & Plan Note (Signed)
Improved, uses H2 blocker, as needed

## 2015-12-20 NOTE — Assessment & Plan Note (Signed)
Controlled, no change in medication DASH diet and commitment to daily physical activity for a minimum of 30 minutes discussed and encouraged, as a part of hypertension management. The importance of attaining a healthy weight is also discussed.  BP/Weight 12/17/2015 10/21/2015 10/02/2015 09/18/2015 09/09/2015 09/03/2015 99991111  Systolic BP Q000111Q 123456 [id number] [id number]  Diastolic BP 82 82 76 72 76 76 82  Wt. (Lbs) 172 175 179 180.1 176 176.6 178  BMI 26.93 27.4 28.03 28.2 27.56 27.24 27.45

## 2015-12-21 ENCOUNTER — Other Ambulatory Visit: Payer: Self-pay | Admitting: Family Medicine

## 2016-01-19 ENCOUNTER — Other Ambulatory Visit: Payer: Self-pay | Admitting: Family Medicine

## 2016-03-15 ENCOUNTER — Other Ambulatory Visit: Payer: Self-pay | Admitting: Family Medicine

## 2016-03-15 DIAGNOSIS — Z1231 Encounter for screening mammogram for malignant neoplasm of breast: Secondary | ICD-10-CM

## 2016-03-24 ENCOUNTER — Ambulatory Visit (HOSPITAL_COMMUNITY): Payer: Medicare Other

## 2016-03-30 ENCOUNTER — Ambulatory Visit (HOSPITAL_COMMUNITY)
Admission: RE | Admit: 2016-03-30 | Discharge: 2016-03-30 | Disposition: A | Discharge status: Home / Self Care | Payer: Medicare Other | Source: Ambulatory Visit | Attending: Family Medicine | Admitting: Family Medicine

## 2016-03-30 DIAGNOSIS — Z1231 Encounter for screening mammogram for malignant neoplasm of breast: Secondary | ICD-10-CM | POA: Diagnosis not present

## 2016-04-08 ENCOUNTER — Other Ambulatory Visit: Payer: Self-pay | Admitting: Family Medicine

## 2016-04-22 ENCOUNTER — Encounter (HOSPITAL_COMMUNITY): Payer: Medicare Other | Attending: Hematology & Oncology | Admitting: Hematology & Oncology

## 2016-04-22 ENCOUNTER — Encounter (HOSPITAL_COMMUNITY): Payer: Self-pay | Admitting: Hematology & Oncology

## 2016-04-22 ENCOUNTER — Encounter (HOSPITAL_COMMUNITY): Payer: Medicare Other

## 2016-04-22 VITALS — BP 129/82 | HR 74 | Temp 98.5°F | Resp 16 | Wt 174.8 lb

## 2016-04-22 DIAGNOSIS — R803 Bence Jones proteinuria: Secondary | ICD-10-CM

## 2016-04-22 DIAGNOSIS — D72822 Plasmacytosis: Secondary | ICD-10-CM | POA: Diagnosis present

## 2016-04-22 DIAGNOSIS — M899 Disorder of bone, unspecified: Secondary | ICD-10-CM

## 2016-04-22 DIAGNOSIS — M898X9 Other specified disorders of bone, unspecified site: Secondary | ICD-10-CM

## 2016-04-22 LAB — COMPREHENSIVE METABOLIC PANEL
ALT: 16 U/L (ref 14–54)
AST: 24 U/L (ref 15–41)
Albumin: 4.2 g/dL (ref 3.5–5.0)
Alkaline Phosphatase: 47 U/L (ref 38–126)
Anion gap: 7 (ref 5–15)
BUN: 9 mg/dL (ref 6–20)
CO2: 27 mmol/L (ref 22–32)
Calcium: 9.3 mg/dL (ref 8.9–10.3)
Chloride: 104 mmol/L (ref 101–111)
Creatinine, Ser: 0.67 mg/dL (ref 0.44–1.00)
GFR calc Af Amer: >60 mL/min (ref >60)
GFR calc non Af Amer: >60 mL/min (ref >60)
Glucose, Bld: 108 mg/dL — ABNORMAL HIGH (ref 65–99)
Potassium: 3.6 mmol/L (ref 3.5–5.1)
Sodium: 138 mmol/L (ref 135–145)
Total Bilirubin: 0.5 mg/dL (ref 0.3–1.2)
Total Protein: 7.5 g/dL (ref 6.5–8.1)

## 2016-04-22 LAB — CBC WITH DIFFERENTIAL/PLATELET
Basophils Absolute: 0 10*3/uL (ref 0.0–0.1)
Basophils Relative: 1 %
Eosinophils Absolute: 0.2 10*3/uL (ref 0.0–0.7)
Eosinophils Relative: 4 %
HCT: 38.7 % (ref 36.0–46.0)
Hemoglobin: 13 g/dL (ref 12.0–15.0)
Lymphocytes Relative: 51 %
Lymphs Abs: 2.3 10*3/uL (ref 0.7–4.0)
MCH: 31.4 pg (ref 26.0–34.0)
MCHC: 33.6 g/dL (ref 30.0–36.0)
MCV: 93.5 fL (ref 78.0–100.0)
Monocytes Absolute: 0.3 10*3/uL (ref 0.1–1.0)
Monocytes Relative: 7 %
Neutro Abs: 1.7 10*3/uL (ref 1.7–7.7)
Neutrophils Relative %: 37 %
Platelets: 214 10*3/uL (ref 150–400)
RBC: 4.14 MIL/uL (ref 3.87–5.11)
RDW: 12.4 % (ref 11.5–15.5)
WBC: 4.5 10*3/uL (ref 4.0–10.5)

## 2016-04-22 NOTE — Patient Instructions (Addendum)
[place] at [facility] Discharge Instructions  RECOMMENDATIONS MADE BY THE CONSULTANT AND ANY TEST RESULTS WILL BE SENT TO YOUR REFERRING PHYSICIAN.  You saw Dr.Penland today. Follow up in 6 months after bone survey and labs. See Amy at checkout for appointments.  Thank you for choosing [place] at [facility] to provide your oncology and hematology care.  To afford each patient quality time with our provider, please arrive at least 15 minutes before your scheduled appointment time.   Beginning January 23rd 2017 lab work for the [facility] will be done in the  Main lab at [facility] on 1st floor. If you have a lab appointment with the [facility] please come in thru the  Main Entrance and check in at the main information desk  You need to re-schedule your appointment should you arrive 10 or more minutes late.  We strive to give you quality time with our providers, and arriving late affects you and other patients whose appointments are after yours.  Also, if you no show three or more times for appointments you may be dismissed from the clinic at the providers discretion.     Again, thank you for choosing [facility].  Our hope is that these requests will decrease the amount of time that you wait before being seen by our physicians.       _____________________________________________________________  Should you have questions after your visit to [facility], please contact our office at [phone] between the hours of 8:30 a.m. and 4:30 p.m.  Voicemails left after 4:30 p.m. will not be returned until the following business day.  For prescription refill requests, have your pharmacy contact our office.         Resources For Cancer Patients and their Caregivers ? American Cancer Society: Can assist with transportation, wigs, general needs, runs Look Good Feel Better.        (770) 138-9886 ? Cancer  Care: Provides financial assistance, online support groups, medication/co-pay assistance.  1-800-813-HOPE 216-177-8286) ? [place] Assists [place] cancer patients and their families through emotional , educational and financial support.  (904) 768-9739 ? [place] DSS Where to apply for food stamps, Medicaid and utility assistance. (442) 617-5450 ? [facility]: Transportation to medical appointments. 813-793-6497 ? Social Security Administration: May apply for disability if have a Stage IV cancer. 920-633-5426 717-533-1193 ? [employer], Disability and Transit Services: Assists with nutrition, care and transit needs. [place] Support Programs: @10RELATIVEDAYS @ > Cancer Support Group  2nd Tuesday of the month 1pm-2pm, Journey Room  > Creative Journey  3rd Tuesday of the month 1130am-1pm, Journey Room  > Look Good Feel Better  1st Wednesday of the month 10am-12 noon, Journey Room (Call [facility] to register [phone])

## 2016-04-22 NOTE — Progress Notes (Signed)
[place] at [facility] Note  Patient Care Team: Fayrene Helper, MD as PCP - General W Evette Cristal, MD as Consulting Physician (Obstetrics and Gynecology) Rutherford Guys, MD as Consulting Physician (Ophthalmology)  CHIEF COMPLAINT: Right shoulder film on 08/21/2015 with oval areas of lucency in the humeral head and neck as well as in the scapula.  HISTORY OF PRESENTING ILLNESS:  Tiffany Mendoza 65 y.o. female is here because of abnormal imaging of the shoulder which showed an oval area of lucency. 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   She is the primary caregiver for her mother. She says that she has to do a lot for her and in the past 6 months she has gotten more combative. Her mother forgets a lot of things and seems to be in denial about her situation. The patient lives with her mother.  [name] is unaccompanied.  She is aggravated and stressed because her mother is even more combative and argues with her much more. It is hard to get her out of the house. She's trying to figure out how to handle the stress.   She has a good appetite.   She's been having problems with depression and lack of motivation in the mornings. She said that the afternoons she feels good and gets out of the house, but in the mornings she doesn't want to move. She's been going to bed later and getting a full night of sleep.   She has been having some cramping in her left hand, near her thumb and second finger. She says it happens when she moves her hand "in a certain way". When it's not cramping, she says that it is sore.  She believes she has received her flu shot, but she is not sure.  She is up to date on her mammogram. She got one last month.  She is going to try and start exercising to prevent her skin from stretching. She wants to "tone up".  I have reviewed labs with the patient.  No new pain, no change in appetite.    MEDICAL HISTORY:    Past Medical History:  Diagnosis Date  . Adjustment disorder with mixed anxiety and depressed mood   . Allergy   . Carotid bruit   . Depression   . Dermatophytosis of foot   . GERD (gastroesophageal reflux disease)   . Hypertension   . Insomnia   . Neck pain   . Osteoporosis     SURGICAL HISTORY: Past Surgical History:  Procedure Laterality Date  . ABDOMINAL HYSTERECTOMY  1988  . HEMORRHOID SURGERY    . myoectomy      SOCIAL HISTORY: Social History   Social History  . Marital status: Divorced    Spouse name: N/A  . Number of children: N/A  . Years of education: N/A   Occupational History  . Not on file.   Social History Main Topics  . Smoking status: Never Smoker  . Smokeless tobacco: Not on file  . Alcohol use No  . Drug use: No  . Sexual activity: Not Currently   Other Topics Concern  . Not on file   Social History Narrative  . 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 Colon polyps Father   . Prostate cancer Father   . Hypertension Sister   . Liver cancer Paternal Grandmother    indicated that her mother is alive. She indicated that her father is deceased. She indicated that both of her sisters are alive.    ALLERGIES:  has No Known Allergies.  MEDICATIONS:  Current Outpatient Prescriptions  Medication Sig Dispense Refill  . b complex vitamins tablet Take 1 tablet by mouth daily. Reported on 10/21/2015    . Cholecalciferol (VITAMIN D3) 1000 UNITS CAPS Take by mouth.      . clorazepate  (TRANXENE) 7.5 MG tablet TAKE ONE TABLET BY MOUTH TWICE DAILY AS NEEDED 60 tablet 1  . fish oil-omega-3 fatty acids 1000 MG capsule Take 1 g by mouth daily.    Marland Kitchen GARLIC PO Take by mouth.    . hydrochlorothiazide (HYDRODIURIL) 25 MG tablet TAKE ONE TABLET BY MOUTH ONCE DAILY 90 tablet 1  . Nutritional Supplements (NUTRITIONAL SUPPLEMENT PO) Take by mouth. Psyllium Husk    . Polyethylene Glycol 3350 (MIRALAX PO) Take by mouth.      . Potassium 99 MG TABS Take by mouth daily.      . ranitidine (ZANTAC) 150 MG capsule Take 150 mg by mouth every evening.    . sertraline (ZOLOFT) 50 MG tablet TAKE ONE TABLET BY MOUTH ONCE DAILY 30 tablet 3  . vitamin C (ASCORBIC ACID) 500 MG tablet Take 500 mg by mouth daily.     No current facility-administered medications for this visit.     Review of Systems  Constitutional: Negative.   HENT: Negative.   Eyes: Negative.   Respiratory: Negative.   Cardiovascular: Negative.   Gastrointestinal: Negative.   Genitourinary: Negative.   Musculoskeletal: Negative.        Cramping and soreness in left hand (between thumb and 2nd finger)  Skin: Negative.   Neurological: Negative.   Endo/Heme/Allergies: Negative.   Psychiatric/Behavioral: Positive for depression.       Stress and aggravation Lack of motivation in the mornings  All other systems reviewed and are negative.  14 point ROS was done and is otherwise as detailed above or in HPI   PHYSICAL EXAMINATION: ECOG PERFORMANCE STATUS: 0 - Asymptomatic  Vitals:   04/22/16 1418  BP: 129/82  Pulse: 74  Resp: 16  Temp: 98.5 F (36.9 C)   Filed Weights   04/22/16 1418  Weight: 174 lb 12.8 oz (79.3 kg)      Physical Exam  Constitutional: She is oriented to person, place, and time and well-developed, well-nourished, and in no distress.  HENT:  Head: Normocephalic and atraumatic.  Nose: Nose normal.  Mouth/Throat: Oropharynx is clear and moist. No oropharyngeal exudate.  Eyes: Conjunctivae and  EOM are normal. Pupils are equal, round, and reactive to light. Right eye exhibits no discharge. Left eye exhibits no discharge. No scleral icterus.  Neck: Normal range of motion. Neck supple. No tracheal deviation present. No thyromegaly present.  Cardiovascular: Normal rate, regular rhythm and normal heart sounds.  Exam reveals no gallop and no friction rub.   No murmur heard. Pulmonary/Chest: Effort normal and breath sounds normal. She has no wheezes. She has no rales.  Abdominal: Soft. Bowel sounds are normal. She  exhibits no distension and no mass. There is no tenderness. There is no rebound and no guarding.  Musculoskeletal: Normal range of motion. She exhibits no edema.  Lymphadenopathy:    She has no cervical adenopathy.  Neurological: She is alert and oriented to person, place, and time. She has normal reflexes. No cranial nerve deficit. Gait normal. Coordination normal.  Skin: Skin is warm and dry. No rash noted.  Psychiatric: Mood, memory, affect and judgment normal.  Nursing note and vitals reviewed.   LABORATORY DATA:  I have reviewed the data as listed Lab Results  Component Value Date   WBC 4.7 10/02/2015   HGB 13.2 10/02/2015   HCT 39.0 10/02/2015   MCV 92.0 10/02/2015   PLT 224 10/02/2015             RADIOGRAPHIC STUDIES: I have personally reviewed the radiological images as listed and agreed with the findings in the report. Study Result   CLINICAL DATA:  Screening.  EXAM: 2D DIGITAL SCREENING BILATERAL MAMMOGRAM WITH CAD AND ADJUNCT TOMO  COMPARISON:  Previous exam(s).  ACR Breast Density Category a: The breast tissue is almost entirely fatty.  FINDINGS: There are no findings suspicious for malignancy. Images were processed with CAD.  IMPRESSION: No mammographic evidence of malignancy. A result letter of this screening mammogram will be mailed directly to the patient.  RECOMMENDATION: Screening mammogram in one year.  (Code:SM-B-01Y)  BI-RADS CATEGORY  1: Negative.   Electronically Signed   By: Margarette Canada M.D.   On: 04/01/2016 14:52     PATHOLOGY    ASSESSMENT & PLAN:  Right shoulder film on 08/21/2015 with oval areas of lucency in the humeral head and neck as well as in the scapula. R shoulder pain Abnormal kappa/lamda light chain ratio, serum Bence jones proteinuria BMBX 10/06/2015 trilineage hematopoiesis, plasmacytosis 6%, polyclonal   She had multiple questions about her BMBX again today. Questions were answered. I reviewed her BMBX in detail. 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 I advised her that there is no evidence of myeloma.   I will check and see when she got her last flu shot to see if she is up to date with this year.  I will order a repeat of her bone survey  and her blood tests in 6 months.   She will return for a follow up in 6 months to review her labs. If everything is normal, we will consider discharge back to her PCP.  Orders Placed This Encounter  Procedures  . DG Bone Survey Met    Standing Status:   Future    Standing Expiration Date:   06/23/2017    Order Specific Question:   Reason for Exam (SYMPTOM  OR DIAGNOSIS REQUIRED)    Answer:   f/u bone lucency. light chain MGUS    Order Specific Question:   Preferred imaging location?    Answer:   [facility] light chains    Standing Status:   Future    Standing Expiration Date:   04/22/2017  . Immunofixation electrophoresis    Standing Status:   Future    Standing Expiration Date:   04/22/2017  . Protein electrophoresis, serum    Standing Status:   Future    Standing Expiration Date:   04/22/2017  . CBC with Differential    Standing Status:   Future    Standing Expiration Date:   04/22/2017  . Comprehensive metabolic panel    Standing Status:  Future    Standing Expiration Date:   04/22/2017    All questions were answered. The patient knows to call the  clinic with any problems, questions or concerns.  This document serves as a record of services personally performed by Ancil Linsey, MD. It was created on her behalf by Martinique Casey, a trained medical scribe. The creation of this record is based on the scribe's personal observations and the provider's statements to them. This document has been checked and approved by the attending provider.  I have reviewed the above documentation for accuracy and completeness, and I agree with the above.  This note was electronically signed.  Molli Hazard, MD  04/22/2016 2:21 PM

## 2016-05-04 ENCOUNTER — Ambulatory Visit: Payer: Medicare Other | Admitting: Family Medicine

## 2016-05-16 ENCOUNTER — Encounter (HOSPITAL_COMMUNITY): Payer: Self-pay | Admitting: Hematology & Oncology

## 2016-05-18 ENCOUNTER — Ambulatory Visit: Payer: Medicare Other

## 2016-05-18 DIAGNOSIS — Z23 Encounter for immunization: Secondary | ICD-10-CM

## 2016-05-19 ENCOUNTER — Other Ambulatory Visit: Payer: Self-pay | Admitting: Family Medicine

## 2016-06-01 ENCOUNTER — Ambulatory Visit (INDEPENDENT_AMBULATORY_CARE_PROVIDER_SITE_OTHER): Payer: Medicare Other | Admitting: Family Medicine

## 2016-06-01 ENCOUNTER — Encounter: Payer: Self-pay | Admitting: Family Medicine

## 2016-06-01 VITALS — BP 130/84 | HR 68 | Temp 97.9°F | Resp 18 | Ht 67.0 in | Wt 176.0 lb

## 2016-06-01 DIAGNOSIS — Z23 Encounter for immunization: Secondary | ICD-10-CM | POA: Diagnosis not present

## 2016-06-01 DIAGNOSIS — R7301 Impaired fasting glucose: Secondary | ICD-10-CM | POA: Diagnosis not present

## 2016-06-01 DIAGNOSIS — I10 Essential (primary) hypertension: Secondary | ICD-10-CM

## 2016-06-01 DIAGNOSIS — F418 Other specified anxiety disorders: Secondary | ICD-10-CM | POA: Diagnosis not present

## 2016-06-01 DIAGNOSIS — K219 Gastro-esophageal reflux disease without esophagitis: Secondary | ICD-10-CM

## 2016-06-01 DIAGNOSIS — E663 Overweight: Secondary | ICD-10-CM

## 2016-06-01 NOTE — Patient Instructions (Signed)
Annual wellness Feb 25 or after, call if you need me before  Fasting labs today  Excellent blood pressure, no med change   It is important that you exercise regularly at least 30 minutes 5 times a week. If you develop chest pain, have severe difficulty breathing, or feel very tired, stop exercising immediately and seek medical attention

## 2016-06-02 ENCOUNTER — Encounter: Payer: Self-pay | Admitting: Family Medicine

## 2016-06-02 LAB — BASIC METABOLIC PANEL
BUN: 9 mg/dL (ref 7–25)
CO2: 31 mmol/L (ref 20–31)
Calcium: 9.7 mg/dL (ref 8.6–10.4)
Chloride: 105 mmol/L (ref 98–110)
Creat: 0.8 mg/dL (ref 0.50–0.99)
Glucose, Bld: 94 mg/dL (ref 65–99)
Potassium: 4.1 mmol/L (ref 3.5–5.3)
Sodium: 141 mmol/L (ref 135–146)

## 2016-06-02 LAB — TSH: TSH: 3.38 mIU/L

## 2016-06-02 LAB — HEMOGLOBIN A1C
Hgb A1c MFr Bld: 5.4 % (ref <5.7)
Mean Plasma Glucose: 108 mg/dL

## 2016-06-02 LAB — LIPID PANEL
Cholesterol: 169 mg/dL (ref <200)
HDL: 58 mg/dL (ref >50)
LDL Cholesterol: 96 mg/dL (ref <100)
Total CHOL/HDL Ratio: 2.9 Ratio (ref <5.0)
Triglycerides: 75 mg/dL (ref <150)
VLDL: 15 mg/dL (ref <30)

## 2016-06-02 LAB — VITAMIN D 25 HYDROXY (VIT D DEFICIENCY, FRACTURES): Vit D, 25-Hydroxy: 39 ng/mL (ref 30–100)

## 2016-06-05 NOTE — Assessment & Plan Note (Signed)
Deteriorated. Patient re-educated about  the importance of commitment to a  minimum of 150 minutes of exercise per week.  The importance of healthy food choices with portion control discussed. Encouraged to start a food diary, count calories and to consider  joining a support group. Sample diet sheets offered. Goals set by the patient for the next several months.   Weight /BMI 06/01/2016 04/22/2016 12/17/2015  WEIGHT 176 lb 0.6 oz 174 lb 12.8 oz 172 lb  HEIGHT 5\' 7"  - 5\' 7"   BMI 27.57 kg/m2 27.38 kg/m2 26.93 kg/m2

## 2016-06-05 NOTE — Assessment & Plan Note (Signed)
Patient educated about the importance of limiting  Carbohydrate intake , the need to commit to daily physical activity for a minimum of 30 minutes , and to commit weight loss. The fact that changes in all these areas will reduce or eliminate all together the development of diabetes is stressed.  Markedly improved and normalized  Diabetic Labs Latest Ref Rng & Units 06/01/2016 04/22/2016 12/17/2015 09/03/2015 04/28/2015  HbA1c <5.7 % 5.4 - 5.7(H) - 5.8(H)  Chol <200 mg/dL 169 - - - 148  HDL >50 mg/dL 58 - - - 56  Calc LDL <100 mg/dL 96 - - - 77  Triglycerides <150 mg/dL 75 - - - 75  Creatinine 0.50 - 0.99 mg/dL 0.80 0.67 0.78 0.82 -   BP/Weight 06/01/2016 04/22/2016 12/17/2015 10/21/2015 10/02/2015 09/18/2015 Q000111Q  Systolic BP [id number] Q000111Q Q000111Q 123456 [id number]  Diastolic BP 84 82 82 82 76 72 76  Wt. (Lbs) 176.04 174.8 172 175 179 180.1 176  BMI 27.57 27.38 26.93 27.4 28.03 28.2 27.56   No flowsheet data found.

## 2016-06-05 NOTE — Assessment & Plan Note (Signed)
Improved, as needed use of medication only

## 2016-06-05 NOTE — Progress Notes (Signed)
Tiffany Mendoza     MRN: [id number]      DOB: 29-Dec-1950   HPI Tiffany Mendoza is here for follow up and re-evaluation of chronic medical conditions, medication management and review of any available recent lab and radiology data.  Preventive health is updated, specifically  Cancer screening and Immunization.   Questions or concerns regarding consultations or procedures which the PT has had in the interim are  addressed. The PT denies any adverse reactions to current medications since the last visit.  There are no new concerns.  There are no specific complaints   ROS Denies recent fever or chills. Denies sinus pressure, nasal congestion, ear pain or sore throat. Denies chest congestion, productive cough or wheezing. Denies chest pains, palpitations and leg swelling Denies abdominal pain, nausea, vomiting,diarrhea or constipation.   Denies dysuria, frequency, hesitancy or incontinence. Denies joint pain, swelling and limitation in mobility. Denies headaches, seizures, numbness, or tingling. Denies depression, anxiety or insomnia. Denies skin break down or rash.   PE  BP 130/84 (BP Location: Left Arm, Patient Position: Sitting, Cuff Size: Normal)   Pulse 68   Temp 97.9 F (36.6 C) (Oral)   Resp 18   Ht 5\' 7"  (1.702 m)   Wt 176 lb 0.6 oz (79.9 kg)   SpO2 96%   BMI 27.57 kg/m   Patient alert and oriented and in no cardiopulmonary distress.  HEENT: No facial asymmetry, EOMI,   oropharynx pink and moist.  Neck supple no JVD, no mass.  Chest: Clear to auscultation bilaterally.  CVS: S1, S2 no murmurs, no S3.Regular rate.  ABD: Soft non tender.   Ext: No edema  MS: Adequate ROM spine, shoulders, hips and knees.  Skin: Intact, no ulcerations or rash noted.  Psych: Good eye contact, normal affect. Memory intact not anxious or depressed appearing.  CNS: CN 2-12 intact, power,  normal throughout.no focal deficits noted.   Assessment & Plan Essential  hypertension Controlled, no change in medication DASH diet and commitment to daily physical activity for a minimum of 30 minutes discussed and encouraged, as a part of hypertension management. The importance of attaining a healthy weight is also discussed.  BP/Weight 06/01/2016 04/22/2016 12/17/2015 10/21/2015 10/02/2015 09/18/2015 Q000111Q  Systolic BP [id number] Q000111Q Q000111Q 123456 [id number]  Diastolic BP 84 82 82 82 76 72 76  Wt. (Lbs) 176.04 174.8 172 175 179 180.1 176  BMI 27.57 27.38 26.93 27.4 28.03 28.2 27.56       Depression with anxiety Improved with spiritual growth and medication, doing very well in adjusting to full time care of her aging mother, no changes at this time  Overweight (BMI 25.0-29.9) Deteriorated. Patient re-educated about  the importance of commitment to a  minimum of 150 minutes of exercise per week.  The importance of healthy food choices with portion control discussed. Encouraged to start a food diary, count calories and to consider  joining a support group. Sample diet sheets offered. Goals set by the patient for the next several months.   Weight /BMI 06/01/2016 04/22/2016 12/17/2015  WEIGHT 176 lb 0.6 oz 174 lb 12.8 oz 172 lb  HEIGHT 5\' 7"  - 5\' 7"   BMI 27.57 kg/m2 27.38 kg/m2 26.93 kg/m2      IFG (impaired fasting glucose) Patient educated about the importance of limiting  Carbohydrate intake , the need to commit to daily physical activity for a minimum of 30 minutes , and to commit weight loss. The fact that changes in all  these areas will reduce or eliminate all together the development of diabetes is stressed.  Markedly improved and normalized  Diabetic Labs Latest Ref Rng & Units 06/01/2016 04/22/2016 12/17/2015 09/03/2015 04/28/2015  HbA1c <5.7 % 5.4 - 5.7(H) - 5.8(H)  Chol <200 mg/dL 169 - - - 148  HDL >50 mg/dL 58 - - - 56  Calc LDL <100 mg/dL 96 - - - 77  Triglycerides <150 mg/dL 75 - - - 75  Creatinine 0.50 - 0.99 mg/dL 0.80 0.67 0.78 0.82 -    BP/Weight 06/01/2016 04/22/2016 12/17/2015 10/21/2015 10/02/2015 09/18/2015 Q000111Q  Systolic BP [id number] Q000111Q Q000111Q 123456 [id number]  Diastolic BP 84 82 82 82 76 72 76  Wt. (Lbs) 176.04 174.8 172 175 179 180.1 176  BMI 27.57 27.38 26.93 27.4 28.03 28.2 27.56   No flowsheet data found.    GERD Improved, as needed use of medication only

## 2016-06-05 NOTE — Assessment & Plan Note (Signed)
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

## 2016-06-05 NOTE — Assessment & Plan Note (Signed)
Controlled, no change in medication DASH diet and commitment to daily physical activity for a minimum of 30 minutes discussed and encouraged, as a part of hypertension management. The importance of attaining a healthy weight is also discussed.  BP/Weight 06/01/2016 04/22/2016 12/17/2015 10/21/2015 10/02/2015 09/18/2015 Q000111Q  Systolic BP [id number] Q000111Q Q000111Q 123456 [id number]  Diastolic BP 84 82 82 82 76 72 76  Wt. (Lbs) 176.04 174.8 172 175 179 180.1 176  BMI 27.57 27.38 26.93 27.4 28.03 28.2 27.56

## 2016-06-08 ENCOUNTER — Ambulatory Visit: Payer: Medicare Other | Admitting: Family Medicine

## 2016-07-03 ENCOUNTER — Other Ambulatory Visit: Payer: Self-pay | Admitting: Family Medicine

## 2016-07-30 ENCOUNTER — Other Ambulatory Visit: Payer: Self-pay | Admitting: Family Medicine

## 2016-08-18 ENCOUNTER — Ambulatory Visit: Payer: Medicare Other

## 2016-08-31 ENCOUNTER — Ambulatory Visit (INDEPENDENT_AMBULATORY_CARE_PROVIDER_SITE_OTHER): Payer: Medicare Other

## 2016-08-31 VITALS — BP 136/88 | HR 73 | Temp 99.1°F | Ht 67.0 in | Wt 177.0 lb

## 2016-08-31 DIAGNOSIS — Z0000 Encounter for general adult medical examination without abnormal findings: Secondary | ICD-10-CM

## 2016-08-31 NOTE — Progress Notes (Signed)
Subjective:   Tiffany Mendoza is a 66 y.o. female who presents for Medicare Annual (Subsequent) preventive examination.  Cardiac Risk Factors include: advanced age (>68men, >56 women);hypertension;sedentary lifestyle     Objective:     Vitals: BP 136/88   Pulse 73   Temp 99.1 F (37.3 C) (Oral)   Ht 5\' 7"  (1.702 m)   Wt 177 lb 0.6 oz (80.3 kg)   SpO2 93%   BMI 27.73 kg/m   Body mass index is 27.73 kg/m.   Tobacco History  Smoking Status  . Never Smoker  Smokeless Tobacco  . Never Used     Counseling given: Not Answered   Past Medical History:  Diagnosis Date  . Adjustment disorder with mixed anxiety and depressed mood   . Allergy   . Carotid bruit   . Depression   . Dermatophytosis of foot   . GERD (gastroesophageal reflux disease)   . Hypertension   . Insomnia   . Neck pain   . Osteoporosis    Past Surgical History:  Procedure Laterality Date  . ABDOMINAL HYSTERECTOMY  1988  . HEMORRHOID SURGERY    . myoectomy     Family History  Problem Relation Age of Onset  . Colon polyps Father   . Prostate cancer Father   . ALS Father   . Thyroid disease Sister   . Thyroid nodules Sister     Thyroidectomy in 1984  . Memory loss Mother   . Fibroids Sister   . Hypertension Sister   . Liver cancer Paternal Grandmother    History  Sexual Activity  . Sexual activity: Not Currently    Outpatient Encounter Prescriptions as of 08/31/2016  Medication Sig  . b complex vitamins tablet Take 1 tablet by mouth daily. Reported on 10/21/2015  . Cholecalciferol (VITAMIN D3) 1000 UNITS CAPS Take by mouth.    . clorazepate (TRANXENE) 7.5 MG tablet TAKE ONE TABLET BY MOUTH TWICE DAILY AS NEEDED  . fish oil-omega-3 fatty acids 1000 MG capsule Take 1 g by mouth daily.  Marland Kitchen GARLIC PO Take by mouth.  . hydrochlorothiazide (HYDRODIURIL) 25 MG tablet TAKE ONE TABLET BY MOUTH ONCE DAILY  . Nutritional Supplements (NUTRITIONAL SUPPLEMENT PO) Take by mouth. Psyllium Husk  .  Polyethylene Glycol 3350 (MIRALAX PO) Take by mouth daily as needed.   . Potassium 99 MG TABS Take by mouth daily.    . ranitidine (ZANTAC) 150 MG capsule Take 150 mg by mouth every evening.  . sertraline (ZOLOFT) 50 MG tablet TAKE ONE TABLET BY MOUTH ONCE DAILY  . Trace Min CaCrCuFeKMgMnPSeZn (MINERALS PO) Take 2-4 drops by mouth every 14 (fourteen) days.  . TURMERIC PO Take 1 tablet by mouth daily.  . vitamin C (ASCORBIC ACID) 500 MG tablet Take 500 mg by mouth daily.   No facility-administered encounter medications on file as of 08/31/2016.     Activities of Daily Living In your present state of health, do you have any difficulty performing the following activities: 08/31/2016 06/01/2016  Hearing? N N  Vision? N N  Difficulty concentrating or making decisions? N N  Walking or climbing stairs? N N  Dressing or bathing? N N  Doing errands, shopping? N N  Preparing Food and eating ? N -  Using the Toilet? N -  In the past six months, have you accidently leaked urine? N -  Do you have problems with loss of bowel control? N -  Managing your Medications? N -  Managing  your Finances? N -  Housekeeping or managing your Housekeeping? 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 Find place of her own (pt-stated)          She would like to get out and find a place of her own.      Fall Risk Fall Risk  08/31/2016 06/01/2016 08/14/2015 04/28/2015 11/14/2013  Falls in the past year? No No No No  No   Depression Screen PHQ 2/9 Scores 08/31/2016 06/01/2016 12/17/2015 04/28/2015  PHQ - 2 Score 0 0 1 3  PHQ- 9 Score - - 1 5     Cognitive Function Normal 6CIT Screen 08/31/2016  What Year? 0 points  What month? 0 points  What time? 0 points  Count back from 20 0 points  Months in reverse 0 points  Repeat phrase 0 points  Total Score 0    Immunization History  Administered Date(s) Administered  . Influenza Split 04/18/2011, 08/09/2012  . Influenza Whole 04/29/2008, 04/15/2010  . Influenza,inj,Quad PF,36+ Mos 03/18/2014, 04/28/2015, 05/18/2016  . PPD Test 08/09/2012  . Pneumococcal Conjugate-13 08/14/2015  . Pneumococcal Polysaccharide-23 09/17/2013  . Td 08/14/2007  . Zoster 10/01/2014   Screening Tests Health Maintenance  Topic Date Due  . COLONOSCOPY  02/22/2017  . TETANUS/TDAP  08/13/2017  . MAMMOGRAM  03/30/2018  . INFLUENZA VACCINE  Completed  . DEXA SCAN  Completed  . Hepatitis C Screening  Completed  . PNA vac Low Risk Adult  Completed      Plan:  I have personally reviewed and addressed the Medicare Annual Wellness questionnaire and have noted the following in the patient's chart:  A. Medical and social history B. Use of alcohol, tobacco or illicit drugs  C. Current medications and supplements D. Functional ability and status E.  Nutritional status F.  Physical activity G. Advance directives H. List of other physicians I.  Hospitalizations, surgeries, and ER visits in previous 12 months J.  [place] to include cognitive, depression, and falls L. Referrals and appointments - Community resource referral sent today to assist patient with housing as well as caregiver assistance for her mother. Patient will call her OB-GYN to schedule her DEXA.  In addition, I have reviewed and discussed with patient certain preventive protocols, quality metrics, and best practice recommendations. A written personalized care plan for preventive services as well  as general preventive health recommendations were provided to patient.  Signed,   Stormy Fabian, LPN Lead Nurse Health Advisor

## 2016-08-31 NOTE — Patient Instructions (Addendum)
[name] , Thank you for taking time to come for your Medicare Wellness Visit. I appreciate your ongoing commitment to your health goals. Please review the following plan we discussed and let me know if I can assist you in the future.   Screening recommendations/referrals: I have sent in a community resource referral for you today. You should receive a call from our care guide Amy, within the next 2-4 weeks to help assist you with caregiver assistance for your mother, as well as housing assistance for you. Colonoscopy: Due 02/2017 Mammogram Due 03/2017 Bone Density: Due NOW, please contact your OB-GYN to schedule this Recommended yearly ophthalmology/optometry visit for glaucoma screening and checkup Recommended yearly dental visit for hygiene and checkup  Vaccinations: Influenza vaccine: Due 03/2017 Pneumococcal vaccine: Up to date Tdap vaccine: Due 07/2017 Shingles vaccine: Up to date    Advanced directives: Please bring a copy of your POA (Power of [place]) and/or Living Will to your next appointment.   Conditions/risks identified: Pre obese, recommend starting a routine exercise routine. Recommend exercising 3 times a week for at least 30 minutes at a time.   Next appointment: Follow up with Dr. Moshe Cipro on 10/26/2016 at 3:00 pm and follow up in 1 year for your annual wellness visit.  Preventive Care 5 Years and Older, Female Preventive care refers to lifestyle choices and visits with your health care provider that can promote health and wellness. What does preventive care include?  A yearly physical exam. This is also called an annual well check.  Dental exams once or twice a year.  Routine eye exams. Ask your health care provider how often you should have your eyes checked.  Personal lifestyle choices, including:  Daily care of your teeth and gums.  Regular physical activity.  Eating a healthy diet.  Avoiding tobacco and drug use.  Limiting alcohol use.  Practicing  safe sex.  Taking low-dose aspirin every day.  Taking vitamin and mineral supplements as recommended by your health care provider. What happens during an annual well check? The services and screenings done by your health care provider during your annual well check will depend on your age, overall health, lifestyle risk factors, and family history of disease. Counseling  Your health care provider may ask you questions about your:  Alcohol use.  Tobacco use.  Drug use.  Emotional well-being.  Home and relationship well-being.  Sexual activity.  Eating habits.  History of falls.  Memory and ability to understand (cognition).  Work and work Statistician.  Reproductive health. Screening  You may have the following tests or measurements:  Height, weight, and BMI.  Blood pressure.  Lipid and cholesterol levels. These may be checked every 5 years, or more frequently if you are over 46 years old.  Skin check.  Lung cancer screening. You may have this screening every year starting at age 109 if you have a 30-pack-year history of smoking and currently smoke or have quit within the past 15 years.  Fecal occult blood test (FOBT) of the stool. You may have this test every year starting at age 94.  Flexible sigmoidoscopy or colonoscopy. You may have a sigmoidoscopy every 5 years or a colonoscopy every 10 years starting at age 60.  Hepatitis C blood test.  Hepatitis B blood test.  Sexually transmitted disease (STD) testing.  Diabetes screening. This is done by checking your blood sugar (glucose) after you have not eaten for a while (fasting). You may have this done every 1-3 years.  Bone density scan. This is done to screen for osteoporosis. You may have this done starting at age 41.  Mammogram. This may be done every 1-2 years. Talk to your health care provider about how often you should have regular mammograms. Talk with your health care provider about your test results,  treatment options, and if necessary, the need for more tests. Vaccines  Your health care provider may recommend certain vaccines, such as:  Influenza vaccine. This is recommended every year.  Tetanus, diphtheria, and acellular pertussis (Tdap, Td) vaccine. You may need a Td booster every 10 years.  Zoster vaccine. You may need this after age 59.  Pneumococcal 13-valent conjugate (PCV13) vaccine. One dose is recommended after age 44.  Pneumococcal polysaccharide (PPSV23) vaccine. One dose is recommended after age 50. Talk to your health care provider about which screenings and vaccines you need and how often you need them. This information is not intended to replace advice given to you by your health care provider. Make sure you discuss any questions you have with your health care provider. Document Released: 07/03/2015 Document Revised: 02/24/2016 Document Reviewed: 04/07/2015 Elsevier Interactive Patient Education  2017 Wall Prevention in the Home Falls can cause injuries. They can happen to people of all ages. There are many things you can do to make your home safe and to help prevent falls. What can I do on the outside of my home?  Regularly fix the edges of walkways and driveways and fix any cracks.  Remove anything that might make you trip as you walk through a door, such as a raised step or threshold.  Trim any bushes or trees on the path to your home.  Use bright outdoor lighting.  Clear any walking paths of anything that might make someone trip, such as rocks or tools.  Regularly check to see if handrails are loose or broken. Make sure that both sides of any steps have handrails.  Any raised decks and porches should have guardrails on the edges.  Have any leaves, snow, or ice cleared regularly.  Use sand or salt on walking paths during winter.  Clean up any spills in your garage right away. This includes oil or grease spills. What can I do in the  bathroom?  Use night lights.  Install grab bars by the toilet and in the tub and shower. Do not use towel bars as grab bars.  Use non-skid mats or decals in the tub or shower.  If you need to sit down in the shower, use a plastic, non-slip stool.  Keep the floor dry. Clean up any water that spills on the floor as soon as it happens.  Remove soap buildup in the tub or shower regularly.  Attach bath mats securely with double-sided non-slip rug tape.  Do not have throw rugs and other things on the floor that can make you trip. What can I do in the bedroom?  Use night lights.  Make sure that you have a light by your bed that is easy to reach.  Do not use any sheets or blankets that are too big for your bed. They should not hang down onto the floor.  Have a firm chair that has side arms. You can use this for support while you get dressed.  Do not have throw rugs and other things on the floor that can make you trip. What can I do in the kitchen?  Clean up any spills right away.  Avoid walking  on wet floors.  Keep items that you use a lot in easy-to-reach places.  If you need to reach something above you, use a strong step stool that has a grab bar.  Keep electrical cords out of the way.  Do not use floor polish or wax that makes floors slippery. If you must use wax, use non-skid floor wax.  Do not have throw rugs and other things on the floor that can make you trip. What can I do with my stairs?  Do not leave any items on the stairs.  Make sure that there are handrails on both sides of the stairs and use them. Fix handrails that are broken or loose. Make sure that handrails are as long as the stairways.  Check any carpeting to make sure that it is firmly attached to the stairs. Fix any carpet that is loose or worn.  Avoid having throw rugs at the top or bottom of the stairs. If you do have throw rugs, attach them to the floor with carpet tape.  Make sure that you have a  light switch at the top of the stairs and the bottom of the stairs. If you do not have them, ask someone to add them for you. What else can I do to help prevent falls?  Wear shoes that:  Do not have high heels.  Have rubber bottoms.  Are comfortable and fit you well.  Are closed at the toe. Do not wear sandals.  If you use a stepladder:  Make sure that it is fully opened. Do not climb a closed stepladder.  Make sure that both sides of the stepladder are locked into place.  Ask someone to hold it for you, if possible.  Clearly mark and make sure that you can see:  Any grab bars or handrails.  First and last steps.  Where the edge of each step is.  Use tools that help you move around (mobility aids) if they are needed. These include:  Canes.  Walkers.  Scooters.  Crutches.  Turn on the lights when you go into a dark area. Replace any light bulbs as soon as they burn out.  Set up your furniture so you have a clear path. Avoid moving your furniture around.  If any of your floors are uneven, fix them.  If there are any pets around you, be aware of where they are.  Review your medicines with your doctor. Some medicines can make you feel dizzy. This can increase your chance of falling. Ask your doctor what other things that you can do to help prevent falls. This information is not intended to replace advice given to you by your health care provider. Make sure you discuss any questions you have with your health care provider. Document Released: 04/02/2009 Document Revised: 11/12/2015 Document Reviewed: 07/11/2014 Elsevier Interactive Patient Education  2017 [employer].

## 2016-10-19 ENCOUNTER — Ambulatory Visit (HOSPITAL_COMMUNITY)
Admission: RE | Admit: 2016-10-19 | Discharge: 2016-10-19 | Disposition: A | Discharge status: Home / Self Care | Payer: Medicare Other | Source: Ambulatory Visit | Attending: Hematology & Oncology | Admitting: Hematology & Oncology

## 2016-10-19 DIAGNOSIS — M47814 Spondylosis without myelopathy or radiculopathy, thoracic region: Secondary | ICD-10-CM | POA: Insufficient documentation

## 2016-10-19 DIAGNOSIS — G9589 Other specified diseases of spinal cord: Secondary | ICD-10-CM | POA: Diagnosis not present

## 2016-10-19 DIAGNOSIS — M47812 Spondylosis without myelopathy or radiculopathy, cervical region: Secondary | ICD-10-CM | POA: Insufficient documentation

## 2016-10-19 DIAGNOSIS — D72822 Plasmacytosis: Secondary | ICD-10-CM | POA: Diagnosis present

## 2016-10-20 ENCOUNTER — Encounter (HOSPITAL_COMMUNITY): Payer: Medicare Other | Attending: Oncology | Admitting: Oncology

## 2016-10-20 ENCOUNTER — Encounter (HOSPITAL_COMMUNITY): Payer: Self-pay

## 2016-10-20 ENCOUNTER — Encounter (HOSPITAL_COMMUNITY): Payer: Medicare Other

## 2016-10-20 DIAGNOSIS — R803 Bence Jones proteinuria: Secondary | ICD-10-CM | POA: Diagnosis not present

## 2016-10-20 DIAGNOSIS — R768 Other specified abnormal immunological findings in serum: Secondary | ICD-10-CM | POA: Diagnosis present

## 2016-10-20 DIAGNOSIS — D72822 Plasmacytosis: Secondary | ICD-10-CM

## 2016-10-20 DIAGNOSIS — M25511 Pain in right shoulder: Secondary | ICD-10-CM | POA: Diagnosis not present

## 2016-10-20 DIAGNOSIS — R7689 Other specified abnormal immunological findings in serum: Secondary | ICD-10-CM | POA: Insufficient documentation

## 2016-10-20 DIAGNOSIS — M25512 Pain in left shoulder: Secondary | ICD-10-CM | POA: Insufficient documentation

## 2016-10-20 LAB — CBC WITH DIFFERENTIAL/PLATELET
Basophils Absolute: 0 10*3/uL (ref 0.0–0.1)
Basophils Relative: 1 %
Eosinophils Absolute: 0.1 10*3/uL (ref 0.0–0.7)
Eosinophils Relative: 1 %
HCT: 38.1 % (ref 36.0–46.0)
Hemoglobin: 12.9 g/dL (ref 12.0–15.0)
Lymphocytes Relative: 40 %
Lymphs Abs: 1.6 10*3/uL (ref 0.7–4.0)
MCH: 31.5 pg (ref 26.0–34.0)
MCHC: 33.9 g/dL (ref 30.0–36.0)
MCV: 92.9 fL (ref 78.0–100.0)
Monocytes Absolute: 0.3 10*3/uL (ref 0.1–1.0)
Monocytes Relative: 7 %
Neutro Abs: 2 10*3/uL (ref 1.7–7.7)
Neutrophils Relative %: 51 %
Platelets: 224 10*3/uL (ref 150–400)
RBC: 4.1 MIL/uL (ref 3.87–5.11)
RDW: 12.6 % (ref 11.5–15.5)
WBC: 3.9 10*3/uL — ABNORMAL LOW (ref 4.0–10.5)

## 2016-10-20 LAB — COMPREHENSIVE METABOLIC PANEL
ALT: 18 U/L (ref 14–54)
AST: 25 U/L (ref 15–41)
Albumin: 4 g/dL (ref 3.5–5.0)
Alkaline Phosphatase: 49 U/L (ref 38–126)
Anion gap: 8 (ref 5–15)
BUN: 11 mg/dL (ref 6–20)
CO2: 27 mmol/L (ref 22–32)
Calcium: 9.3 mg/dL (ref 8.9–10.3)
Chloride: 103 mmol/L (ref 101–111)
Creatinine, Ser: 0.74 mg/dL (ref 0.44–1.00)
GFR calc Af Amer: >60 mL/min (ref >60)
GFR calc non Af Amer: >60 mL/min (ref >60)
Glucose, Bld: 133 mg/dL — ABNORMAL HIGH (ref 65–99)
Potassium: 3.4 mmol/L — ABNORMAL LOW (ref 3.5–5.1)
Sodium: 138 mmol/L (ref 135–145)
Total Bilirubin: 0.4 mg/dL (ref 0.3–1.2)
Total Protein: 7.3 g/dL (ref 6.5–8.1)

## 2016-10-20 NOTE — Patient Instructions (Addendum)
[facility] at [facility] Discharge Instructions  RECOMMENDATIONS MADE BY THE CONSULTANT AND ANY TEST RESULTS WILL BE SENT TO YOUR REFERRING PHYSICIAN.  You were seen today by Dr. Louise Zhou Follow up in 6 months with lab work   Thank you for choosing [facility] at [facility] to provide your oncology and hematology care.  To afford each patient quality time with our provider, please arrive at least 15 minutes before your scheduled appointment time.    If you have a lab appointment with the Cancer Center please come in thru the  Main Entrance and check in at the main information desk  You need to re-schedule your appointment should you arrive 10 or more minutes late.  We strive to give you quality time with our providers, and arriving late affects you and other patients whose appointments are after yours.  Also, if you no show three or more times for appointments you may be dismissed from the clinic at the providers discretion.     Again, thank you for choosing [facility].  Our hope is that these requests will decrease the amount of time that you wait before being seen by our physicians.       _____________________________________________________________  Should you have questions after your visit to [facility], please contact our office at [phone] between the hours of 8:30 a.m. and 4:30 p.m.  Voicemails left after 4:30 p.m. will not be returned until the following business day.  For prescription refill requests, have your pharmacy contact our office.       Resources For Cancer Patients and their Caregivers ? American Cancer Society: Can assist with transportation, wigs, general needs, runs Look Good Feel Better.        1-888-227-6333 ? Cancer Care: Provides financial assistance, online support groups, medication/co-pay assistance.  1-800-813-HOPE (4673) ? [facility] Assists  [place] cancer patients and their families through emotional , educational and financial support.  336-427-4357 ? [place] DSS Where to apply for food stamps, Medicaid and utility assistance. 336-342-1394 ? [facility]: Transportation to medical appointments. 336-347-2287 ? Social Security Administration: May apply for disability if have a Stage IV cancer. 336-342-7796 1-800-772-1213 ? [place] Aging, Disability and Transit Services: Assists with nutrition, care and transit needs. 336-349-2343  Cancer Center Support Programs: @10RELATIVEDAYS@ > Cancer Support Group  2nd Tuesday of the month 1pm-2pm, Journey Room  > Creative Journey  3rd Tuesday of the month 1130am-1pm, Journey Room  > Look Good Feel Better  1st Wednesday of the month 10am-12 noon, Journey Room (Call American Cancer Society to register 1-800-395-5775)    

## 2016-10-20 NOTE — Progress Notes (Signed)
[facility] Note  Patient Care Team: Fayrene Helper, MD as PCP - General W Evette Cristal, MD as Consulting Physician (Obstetrics and Gynecology) Rutherford Guys, MD as Consulting Physician (Ophthalmology)  CHIEF COMPLAINT: Right shoulder film on 08/21/2015 with oval areas of lucency in the humeral head and neck as well as in the scapula.  HISTORY OF PRESENTING ILLNESS:  Tiffany Mendoza 66 y.o. female is here because of abnormal imaging of the shoulder which showed an oval area of lucency. 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   Tiffany Mendoza presents to the clinic today for continuing follow up. I personally reviewed and went over labs and bone scans with the patient. Patient's bone survey from 10/20/2016 was negative for multiple myeloma or metastatic disease.   She states she made some life changes to reduce her stress such as not stressing over not completing everything in 1 day like she used to.   She notes that the pain in her left shoulder has significantly reduced. She attributes it to inflammation. She states she is currently drinking alkaline water for it as well.   She notes she has normal bowel movements. She has a bowel movement at least once a day.   Denies chest pain, sob, and abdominal pain.     MEDICAL HISTORY:  Past Medical History:  Diagnosis Date  . Adjustment disorder with mixed anxiety and depressed mood   . Allergy   . Carotid bruit   . Depression   . Dermatophytosis of foot   . GERD (gastroesophageal reflux disease)   . Hypertension   . Insomnia   . Neck pain   . Osteoporosis     SURGICAL HISTORY: Past Surgical History:  Procedure Laterality Date  . ABDOMINAL HYSTERECTOMY  1988  . HEMORRHOID SURGERY    . myoectomy      SOCIAL HISTORY: Social History   Social History  . Marital status: Divorced    Spouse name: N/A  . Number of children: N/A  . Years of education: N/A    Occupational History  . Not on file.   Social History Main Topics  . Smoking status: Never Smoker  . Smokeless tobacco: Never Used  . Alcohol use No  . Drug use: No  . Sexual activity: Not Currently   Other Topics Concern  . Not on file   Social History Narrative  . 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 Colon polyps Father   . Prostate cancer Father   . ALS Father   . Thyroid disease Sister   . Thyroid nodules Sister     Thyroidectomy in 1984  . Memory loss Mother   . Fibroids Sister   . Hypertension Sister   . Liver cancer Paternal Grandmother    indicated that her mother is alive. She indicated that her father is deceased. She indicated that both of her sisters are alive. She indicated that the status of her paternal grandmother is  unknown.    ALLERGIES:  has No Known Allergies.  MEDICATIONS:  Current Outpatient Prescriptions  Medication Sig Dispense Refill  . b complex vitamins tablet Take 1 tablet by mouth daily. Reported on 10/21/2015    . Cholecalciferol (VITAMIN D3) 1000 UNITS CAPS Take by mouth.      . clorazepate (TRANXENE) 7.5 MG tablet TAKE ONE TABLET BY MOUTH TWICE DAILY AS NEEDED 60 tablet 1  . fish oil-omega-3 fatty acids 1000 MG capsule Take 1 g by mouth daily.    Marland Kitchen GARLIC PO Take by mouth.    . hydrochlorothiazide (HYDRODIURIL) 25 MG tablet TAKE ONE TABLET BY MOUTH ONCE DAILY 90 tablet 1  . Nutritional Supplements (NUTRITIONAL SUPPLEMENT PO) Take by mouth. Psyllium Husk    .  Polyethylene Glycol 3350 (MIRALAX PO) Take by mouth daily as needed.     . Potassium 99 MG TABS Take by mouth daily.      . ranitidine (ZANTAC) 150 MG capsule Take 150 mg by mouth every evening.    . sertraline (ZOLOFT) 50 MG tablet TAKE ONE TABLET BY MOUTH ONCE DAILY 90 tablet 1  . Trace Min CaCrCuFeKMgMnPSeZn (MINERALS PO) Take 2-4 drops by mouth every 14 (fourteen) days.    . TURMERIC PO Take 1 tablet by mouth daily.    . vitamin C (ASCORBIC ACID) 500 MG tablet Take 500 mg by mouth daily.     No current facility-administered medications for this visit.     Review of Systems  Constitutional: Negative.   HENT: Negative.   Eyes: Negative.   Respiratory: Negative.  Negative for shortness of breath.   Cardiovascular: Negative.  Negative for chest pain.  Gastrointestinal: Negative for abdominal pain.  Genitourinary: Negative.   Musculoskeletal: Negative.   Skin: Negative.   Neurological: Negative.   Endo/Heme/Allergies: Negative.   All other systems reviewed and are negative.  14 point ROS was done and is otherwise as detailed above or in HPI   PHYSICAL EXAMINATION: ECOG PERFORMANCE STATUS: 0 - Asymptomatic  Vitals:   10/20/16 1410  BP: 129/73  Pulse: 72  Resp: 18  Temp: 98.5 F (36.9 C)   Filed Weights   10/20/16 1410  Weight: 178 lb 3.2 oz (80.8 kg)      Physical Exam  Constitutional: She is oriented to person, place, and time and well-developed, well-nourished, and in no distress.  HENT:  Head: Normocephalic and atraumatic.  Nose: Nose normal.  Mouth/Throat: Oropharynx is clear and moist. No oropharyngeal exudate.  Eyes: Conjunctivae and EOM are normal. Pupils are equal, round, and reactive to light. Right eye exhibits no discharge. Left eye exhibits no discharge. No scleral icterus.  Neck: Normal range of motion. Neck supple. No tracheal deviation present. No thyromegaly present.  Cardiovascular: Normal rate, regular rhythm and normal heart sounds.  Exam reveals  no gallop and no friction rub.   No murmur heard. Pulmonary/Chest: Effort normal and breath sounds normal. She has no wheezes. She has no rales.  Abdominal: Soft. Bowel sounds are normal. She exhibits no distension and no mass. There is no tenderness. There is no rebound and no guarding.  Musculoskeletal: Normal range of motion. She exhibits no edema.  Lymphadenopathy:    She has no cervical adenopathy.  Neurological: She is alert and oriented to person, place, and time. She has normal reflexes. No cranial nerve deficit. Gait normal. Coordination normal.  Skin: Skin is warm and dry. No rash noted.  Psychiatric: Mood, memory, affect and judgment normal.  Nursing note  and vitals reviewed.   LABORATORY DATA:  I have reviewed the data as listed Lab Results  Component Value Date   WBC 3.9 (L) 10/20/2016   HGB 12.9 10/20/2016   HCT 38.1 10/20/2016   MCV 92.9 10/20/2016   PLT 224 10/20/2016             RADIOGRAPHIC STUDIES: I have personally reviewed the radiological images as listed and agreed with the findings in the report. Study Result   CLINICAL DATA:  Screening.  EXAM: 2D DIGITAL SCREENING BILATERAL MAMMOGRAM WITH CAD AND ADJUNCT TOMO  COMPARISON:  Previous exam(s).  ACR Breast Density Category a: The breast tissue is almost entirely fatty.  FINDINGS: There are no findings suspicious for malignancy. Images were processed with CAD.  IMPRESSION: No mammographic evidence of malignancy. A result letter of this screening mammogram will be mailed directly to the patient.  RECOMMENDATION: Screening mammogram in one year. (Code:SM-B-01Y)  BI-RADS CATEGORY  1: Negative.   Electronically Signed   By: Margarette Canada M.D.   On: 04/01/2016 14:52   METASTATIC BONE SURVEY 10/20/2016  IMPRESSION: No new or enlarging suspicious lytic lesions. Questionable subtle very small lucency in the proximal left humerus is stable. No definite evidence for multiple myeloma  or metastatic disease.  PATHOLOGY    ASSESSMENT & PLAN:  Right shoulder film on 08/21/2015 with oval areas of lucency in the humeral head and neck as well as in the scapula. R shoulder pain Abnormal kappa/lamda light chain ratio, serum Bence jones proteinuria BMBX 10/06/2015 trilineage hematopoiesis, plasmacytosis 6%, polyclonal   I again discussed with the patient that her clinical picture does not fit that of multiple myeloma.  Labs and bone survey reviewed. Results noted above.   Patient's bone met survey was negative for multiple myeloma or metastatic disease.   RTC in 6 months with repeat labs. If everything is normal, we will consider discharge back to her PCP.  Orders Placed This Encounter  Procedures  . CBC with Differential    Standing Status:   Future    Standing Expiration Date:   10/20/2017  . Comprehensive metabolic panel    Standing Status:   Future    Standing Expiration Date:   10/20/2017  . Kappa/lambda light chains    Standing Status:   Future    Standing Expiration Date:   10/20/2017  . Beta 2 microglobuline, serum    Standing Status:   Future    Standing Expiration Date:   10/20/2017  . Protein electrophoresis, serum    Standing Status:   Future    Standing Expiration Date:   10/20/2017  . Immunofixation electrophoresis    Standing Status:   Future    Standing Expiration Date:   10/20/2017    All questions were answered. The patient knows to call the clinic with any problems, questions or concerns.  This document serves as a record of services personally performed by Twana First, MD. It was created on her behalf by Shirlean Mylar, a trained medical scribe. The creation of this record is based on the scribe's personal observations and the provider's statements to them. This document has been checked and approved by the attending provider.  I have reviewed the above documentation for accuracy and completeness, and I agree with the above.  This note was  electronically signed.  [employer]  10/20/2016 2:16 PM

## 2016-10-21 LAB — PROTEIN ELECTROPHORESIS, SERUM
A/G Ratio: 1.3 (ref 0.7–1.7)
Albumin ELP: 4 g/dL (ref 2.9–4.4)
Alpha-1-Globulin: 0.2 g/dL (ref 0.0–0.4)
Alpha-2-Globulin: 0.8 g/dL (ref 0.4–1.0)
Beta Globulin: 0.9 g/dL (ref 0.7–1.3)
Gamma Globulin: 1.2 g/dL (ref 0.4–1.8)
Globulin, Total: 3.1 g/dL (ref 2.2–3.9)
Total Protein ELP: 7.1 g/dL (ref 6.0–8.5)

## 2016-10-21 LAB — KAPPA/LAMBDA LIGHT CHAINS
Kappa free light chain: 18.3 mg/L (ref 3.3–19.4)
Kappa, lambda light chain ratio: 1.48 (ref 0.26–1.65)
Lambda free light chains: 12.4 mg/L (ref 5.7–26.3)

## 2016-10-23 ENCOUNTER — Other Ambulatory Visit: Payer: Self-pay | Admitting: Family Medicine

## 2016-10-23 LAB — IMMUNOFIXATION ELECTROPHORESIS
IgA: 170 mg/dL (ref 87–352)
IgG (Immunoglobin G), Serum: 1170 mg/dL (ref 700–1600)
IgM, Serum: 44 mg/dL (ref 26–217)
Total Protein ELP: 6.8 g/dL (ref 6.0–8.5)

## 2016-10-26 ENCOUNTER — Encounter: Payer: Self-pay | Admitting: Family Medicine

## 2016-10-26 ENCOUNTER — Ambulatory Visit (INDEPENDENT_AMBULATORY_CARE_PROVIDER_SITE_OTHER): Payer: Medicare Other | Admitting: Family Medicine

## 2016-10-26 ENCOUNTER — Telehealth: Payer: Self-pay | Admitting: Family Medicine

## 2016-10-26 VITALS — BP 126/80 | HR 84 | Temp 97.4°F | Resp 16 | Ht 67.0 in | Wt 179.0 lb

## 2016-10-26 DIAGNOSIS — F418 Other specified anxiety disorders: Secondary | ICD-10-CM

## 2016-10-26 DIAGNOSIS — L918 Other hypertrophic disorders of the skin: Secondary | ICD-10-CM

## 2016-10-26 DIAGNOSIS — F419 Anxiety disorder, unspecified: Secondary | ICD-10-CM | POA: Diagnosis not present

## 2016-10-26 DIAGNOSIS — F5105 Insomnia due to other mental disorder: Secondary | ICD-10-CM | POA: Diagnosis not present

## 2016-10-26 DIAGNOSIS — I10 Essential (primary) hypertension: Secondary | ICD-10-CM

## 2016-10-26 DIAGNOSIS — E663 Overweight: Secondary | ICD-10-CM

## 2016-10-26 NOTE — Patient Instructions (Signed)
f/u in 5 month, call if you need me before.  No medication changes  Keep in touch with social worker and keep doing the best that you can for your Mom  It is important that you exercise regularly at least 30 minutes 5 times a week. If you develop chest pain, have severe difficulty breathing, or feel very tired, stop exercising immediately and seek medical attention    Thank you  for choosing [facility]. We consider it a privelige to serve you.  Delivering excellent health care in a caring and  compassionate way is our goal.  Partnering with you,  so that together we can achieve this goal is our strategy.

## 2016-10-26 NOTE — Progress Notes (Signed)
KELTY SZAFRAN     MRN: [id number]      DOB: 1951/04/29   HPI Ms. Laughery is here for follow up and re-evaluation of chronic medical conditions, medication management and review of any available recent lab and radiology data.  Preventive health is updated, specifically  Cancer screening and Immunization.   Questions or concerns regarding consultations or procedures which the PT has had in the interim are  addressed.She has been released from the hematology clinic which is good news The PT denies any adverse reactions to current medications since the last visit.  After the visit she called back requesting dermatology referral for skin tag removal and request entered . Trying to get personal housing and is seeking help with this Mentally she is an improved situation as she deals with caring for her Mom    ROS Denies recent fever or chills. Denies sinus pressure, nasal congestion, ear pain or sore throat. Denies chest congestion, productive cough or wheezing. Denies chest pains, palpitations and leg swelling Denies abdominal pain, nausea, vomiting,diarrhea or constipation.   Denies dysuria, frequency, hesitancy or incontinence. Denies joint pain, swelling and limitation in mobility. Denies headaches, seizures, numbness, or tingling. Denies depression uncontrolled , anxiety or insomnia.Uses medication which is helpful Denies skin break down or rash.   PE  BP 126/80 (BP Location: Left Arm, Patient Position: Sitting, Cuff Size: Normal)   Pulse 84   Temp 97.4 F (36.3 C) (Temporal)   Resp 16   Ht 5\' 7"  (1.702 m)   Wt 179 lb (81.2 kg)   SpO2 95%   BMI 28.04 kg/m   Patient alert and oriented and in no cardiopulmonary distress.  HEENT: No facial asymmetry, EOMI,   oropharynx pink and moist.  Neck supple no JVD, no mass.  Chest: Clear to auscultation bilaterally.  CVS: S1, S2 no murmurs, no S3.Regular rate.  ABD: Soft non tender.   Ext: No edema  MS: Adequate ROM spine,  shoulders, hips and knees.  Skin: Intact, no ulcerations or rash noted.  Psych: Good eye contact, normal affect. Memory intact not anxious or depressed appearing.  CNS: CN 2-12 intact, power,  normal throughout.no focal deficits noted.   Assessment & Plan  Essential hypertension Controlled, no change in medication DASH diet and commitment to daily physical activity for a minimum of 30 minutes discussed and encouraged, as a part of hypertension management. The importance of attaining a healthy weight is also discussed.  BP/Weight 10/26/2016 10/20/2016 08/31/2016 06/01/2016 04/22/2016 6/38/9373 09/20/8766  Systolic BP 115 726 203 559 741 638 453  Diastolic BP 80 73 88 84 82 82 82  Wt. (Lbs) 179 178.2 177.04 176.04 174.8 172 175  BMI 28.04 27.91 27.73 27.57 27.38 26.93 27.4       Depression with anxiety Controlled, no change in medication   Overweight (BMI 25.0-29.9) Deteriorated. Patient re-educated about  the importance of commitment to a  minimum of 150 minutes of exercise per week.  The importance of healthy food choices with portion control discussed. Encouraged to start a food diary, count calories and to consider  joining a support group. Sample diet sheets offered. Goals set by the patient for the next several months.   Weight /BMI 10/26/2016 10/20/2016 08/31/2016  WEIGHT 179 lb 178 lb 3.2 oz 177 lb 0.6 oz  HEIGHT 5\' 7"  - 5\' 7"   BMI 28.04 kg/m2 27.91 kg/m2 27.73 kg/m2      Insomnia secondary to anxiety Sleep hygiene reviewed and written information offered  also. Prescription sent for  medication needed.   Cutaneous skin tags Called in after visit requesting derm referral for management, same carried out

## 2016-10-26 NOTE — Telephone Encounter (Signed)
Patient is requesting a dermatology referral to have some tag moles removed.

## 2016-10-27 ENCOUNTER — Other Ambulatory Visit: Payer: Self-pay | Admitting: Family Medicine

## 2016-10-27 DIAGNOSIS — L918 Other hypertrophic disorders of the skin: Secondary | ICD-10-CM

## 2016-10-27 NOTE — Telephone Encounter (Signed)
Referral is entered, please let her know

## 2016-10-28 DIAGNOSIS — L918 Other hypertrophic disorders of the skin: Secondary | ICD-10-CM | POA: Insufficient documentation

## 2016-10-28 NOTE — Assessment & Plan Note (Signed)
Controlled, no change in medication  

## 2016-10-28 NOTE — Assessment & Plan Note (Signed)
Called in after visit requesting derm referral for management, same carried out

## 2016-10-28 NOTE — Assessment & Plan Note (Signed)
Deteriorated. Patient re-educated about  the importance of commitment to a  minimum of 150 minutes of exercise per week.  The importance of healthy food choices with portion control discussed. Encouraged to start a food diary, count calories and to consider  joining a support group. Sample diet sheets offered. Goals set by the patient for the next several months.   Weight /BMI 10/26/2016 10/20/2016 08/31/2016  WEIGHT 179 lb 178 lb 3.2 oz 177 lb 0.6 oz  HEIGHT 5\' 7"  - 5\' 7"   BMI 28.04 kg/m2 27.91 kg/m2 27.73 kg/m2

## 2016-10-28 NOTE — Assessment & Plan Note (Signed)
Controlled, no change in medication DASH diet and commitment to daily physical activity for a minimum of 30 minutes discussed and encouraged, as a part of hypertension management. The importance of attaining a healthy weight is also discussed.  BP/Weight 10/26/2016 10/20/2016 08/31/2016 06/01/2016 04/22/2016 2/44/0102 12/19/5364  Systolic BP 440 347 425 956 387 564 332  Diastolic BP 80 73 88 84 82 82 82  Wt. (Lbs) 179 178.2 177.04 176.04 174.8 172 175  BMI 28.04 27.91 27.73 27.57 27.38 26.93 27.4

## 2016-10-28 NOTE — Assessment & Plan Note (Signed)
Sleep hygiene reviewed and written information offered also. Prescription sent for  medication needed.  

## 2016-11-15 ENCOUNTER — Telehealth: Payer: Self-pay | Admitting: Family Medicine

## 2016-11-15 NOTE — Telephone Encounter (Signed)
Patient called to check the status of paperwork she dropped off last Wednesday. Cb#: [phone]

## 2016-11-15 NOTE — Telephone Encounter (Signed)
Was she advised already that she has to have medicaid for this service?

## 2016-11-16 NOTE — Telephone Encounter (Signed)
Left voicemail for patient to call back to discuss.

## 2016-12-03 ENCOUNTER — Other Ambulatory Visit: Payer: Self-pay | Admitting: Family Medicine

## 2017-01-11 ENCOUNTER — Ambulatory Visit: Payer: Medicare Other | Admitting: Family Medicine

## 2017-02-10 ENCOUNTER — Telehealth: Payer: Self-pay | Admitting: Internal Medicine

## 2017-02-10 NOTE — Telephone Encounter (Signed)
Sure

## 2017-02-13 NOTE — Telephone Encounter (Signed)
Dr. Silverio Decamp will you accept this patient?

## 2017-02-13 NOTE — Telephone Encounter (Signed)
ok 

## 2017-02-15 NOTE — Telephone Encounter (Signed)
I informed patient of this. Patient states that she has been having rectal irritation and will callback when the November schedule is available.

## 2017-02-26 ENCOUNTER — Other Ambulatory Visit: Payer: Self-pay | Admitting: Family Medicine

## 2017-02-27 ENCOUNTER — Other Ambulatory Visit: Payer: Self-pay

## 2017-02-27 MED ORDER — CLORAZEPATE DIPOTASSIUM 7.5 MG PO TABS: 7.5000 mg | ORAL_TABLET | Freq: Two times a day (BID) | ORAL | 1 refills | Status: DC | PRN

## 2017-02-27 NOTE — Telephone Encounter (Signed)
Seen [phone] 

## 2017-03-07 ENCOUNTER — Telehealth: Payer: Self-pay

## 2017-03-07 NOTE — Telephone Encounter (Signed)
Left message for return call. Pt left vm message on previous contact number for previous care guide. Provided updated contact information to answer any questions. Will await call back and try again in a few days if no return call received.    Josepha Pigg, B.A.  Care Guide [phone]

## 2017-03-28 ENCOUNTER — Ambulatory Visit: Payer: Medicare Other | Admitting: Family Medicine

## 2017-04-28 ENCOUNTER — Encounter (HOSPITAL_COMMUNITY): Payer: Self-pay | Admitting: Oncology

## 2017-04-28 ENCOUNTER — Encounter (HOSPITAL_COMMUNITY): Payer: Medicare Other

## 2017-04-28 ENCOUNTER — Ambulatory Visit: Payer: Medicare Other | Admitting: Gastroenterology

## 2017-04-28 ENCOUNTER — Encounter (HOSPITAL_COMMUNITY): Payer: Medicare Other | Attending: Oncology | Admitting: Oncology

## 2017-04-28 VITALS — BP 130/81 | HR 66 | Temp 97.9°F | Resp 16 | Wt 180.6 lb

## 2017-04-28 DIAGNOSIS — R768 Other specified abnormal immunological findings in serum: Secondary | ICD-10-CM

## 2017-04-28 DIAGNOSIS — M899 Disorder of bone, unspecified: Secondary | ICD-10-CM | POA: Diagnosis present

## 2017-04-28 DIAGNOSIS — M25511 Pain in right shoulder: Secondary | ICD-10-CM

## 2017-04-28 LAB — CBC WITH DIFFERENTIAL/PLATELET
Basophils Absolute: 0 10*3/uL (ref 0.0–0.1)
Basophils Relative: 1 %
Eosinophils Absolute: 0.2 10*3/uL (ref 0.0–0.7)
Eosinophils Relative: 5 %
HCT: 39.7 % (ref 36.0–46.0)
Hemoglobin: 13.1 g/dL (ref 12.0–15.0)
Lymphocytes Relative: 47 %
Lymphs Abs: 2 10*3/uL (ref 0.7–4.0)
MCH: 31.1 pg (ref 26.0–34.0)
MCHC: 33 g/dL (ref 30.0–36.0)
MCV: 94.3 fL (ref 78.0–100.0)
Monocytes Absolute: 0.3 10*3/uL (ref 0.1–1.0)
Monocytes Relative: 6 %
Neutro Abs: 1.7 10*3/uL (ref 1.7–7.7)
Neutrophils Relative %: 41 %
Platelets: 205 10*3/uL (ref 150–400)
RBC: 4.21 MIL/uL (ref 3.87–5.11)
RDW: 12.7 % (ref 11.5–15.5)
WBC: 4.2 10*3/uL (ref 4.0–10.5)

## 2017-04-28 LAB — COMPREHENSIVE METABOLIC PANEL WITH GFR
ALT: 17 U/L (ref 14–54)
AST: 21 U/L (ref 15–41)
Albumin: 4.2 g/dL (ref 3.5–5.0)
Alkaline Phosphatase: 53 U/L (ref 38–126)
Anion gap: 7 (ref 5–15)
BUN: 10 mg/dL (ref 6–20)
CO2: 27 mmol/L (ref 22–32)
Calcium: 9.5 mg/dL (ref 8.9–10.3)
Chloride: 105 mmol/L (ref 101–111)
Creatinine, Ser: 0.8 mg/dL (ref 0.44–1.00)
GFR calc Af Amer: >60 mL/min (ref >60)
GFR calc non Af Amer: >60 mL/min (ref >60)
Glucose, Bld: 106 mg/dL — ABNORMAL HIGH (ref 65–99)
Potassium: 3.7 mmol/L (ref 3.5–5.1)
Sodium: 139 mmol/L (ref 135–145)
Total Bilirubin: 0.6 mg/dL (ref 0.3–1.2)
Total Protein: 7.6 g/dL (ref 6.5–8.1)

## 2017-04-28 NOTE — Patient Instructions (Signed)
[facility] at [facility] Discharge Instructions  RECOMMENDATIONS MADE BY THE CONSULTANT AND ANY TEST RESULTS WILL BE SENT TO YOUR REFERRING PHYSICIAN.  Seen by Dr. Oliva Bustard Follow up in one year,labs also  Thank you for choosing [facility] at [facility] to provide your oncology and hematology care.  To afford each patient quality time with our provider, please arrive at least 15 minutes before your scheduled appointment time.    If you have a lab appointment with the [facility] please come in thru the  Main Entrance and check in at the main information desk  You need to re-schedule your appointment should you arrive 10 or more minutes late.  We strive to give you quality time with our providers, and arriving late affects you and other patients whose appointments are after yours.  Also, if you no show three or more times for appointments you may be dismissed from the clinic at the providers discretion.     Again, thank you for choosing [facility].  Our hope is that these requests will decrease the amount of time that you wait before being seen by our physicians.       _____________________________________________________________  Should you have questions after your visit to [facility], please contact our office at [phone] between the hours of 8:30 a.m. and 4:30 p.m.  Voicemails left after 4:30 p.m. will not be returned until the following business day.  For prescription refill requests, have your pharmacy contact our office.       Resources For Cancer Patients and their Caregivers ? American Cancer Society: Can assist with transportation, wigs, general needs, runs Look Good Feel Better.        319-335-8772 ? Cancer Care: Provides financial assistance, online support groups, medication/co-pay assistance.  1-800-813-HOPE 302-576-5165) ? [facility] Assists [place] cancer patients  and their families through emotional , educational and financial support.  8622315997 ? [place] DSS Where to apply for food stamps, Medicaid and utility assistance. 984 488 1246 ? [facility]: Transportation to medical appointments. 312-066-0031 ? Social Security Administration: May apply for disability if have a Stage IV cancer. (718)375-6146 502-260-3937 ? [employer], Disability and Transit Services: Assists with nutrition, care and transit needs. [facility] Support Programs: @10RELATIVEDAYS @ > Cancer Support Group  2nd Tuesday of the month 1pm-2pm, Journey Room  > Creative Journey  3rd Tuesday of the month 1130am-1pm, Journey Room  > Look Good Feel Better  1st Wednesday of the month 10am-12 noon, Journey Room (Call [facility] to register [phone])

## 2017-04-28 NOTE — Progress Notes (Signed)
[facility] Note  Patient Care Team: Fayrene Helper, MD as PCP - General Tiffany Fus, MD as Consulting Physician (Obstetrics and Gynecology) Rutherford Guys, MD as Consulting Physician (Ophthalmology)  CHIEF COMPLAINT: Right shoulder film on 08/21/2015 with oval areas of lucency in the humeral head and neck as well as in the scapula.  HISTORY OF PRESENTING ILLNESS:  Tiffany Mendoza 66 y.o. female is here because of abnormal imaging of the shoulder which showed an oval area of lucency. 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   Ms. Mendoza presents to the clinic today for continuing follow up. I personally reviewed and went over labs and bone scans with the patient. Patient's bone survey from 10/20/2016 was negative for multiple myeloma or metastatic disease.   She states she made some life changes to reduce her stress such as not stressing over not completing everything in 1 day like she used to.   She notes that the pain in her left shoulder has significantly reduced. She attributes it to inflammation. She states she is currently drinking alkaline water for it as well.   She notes she has normal bowel movements. She has a bowel movement at least once a day.   Denies chest pain, sob, and abdominal pain.   Patient remains asymptomatic.  Had a mammogram in October which was negative.   MEDICAL HISTORY:  Past Medical History:  Diagnosis Date  . Adjustment disorder with mixed anxiety and depressed mood   . Allergy   . Carotid bruit   . Depression   . Dermatophytosis of foot   . GERD (gastroesophageal reflux disease)   . Hypertension   . Insomnia   . Neck pain   . Osteoporosis     SURGICAL HISTORY: Past Surgical History:  Procedure Laterality Date  . ABDOMINAL HYSTERECTOMY  1988  . HEMORRHOID SURGERY    . myoectomy      SOCIAL HISTORY: Social History   Socioeconomic History  . Marital status:  Divorced    Spouse name: Not on file  . Number of children: Not on file  . Years of education: Not on file  . Highest education level: Not on file  Social Needs  . Financial resource strain: Not on file  . Food insecurity - worry: Not on file  . Food insecurity - inability: Not on file  . Transportation needs - medical: Not on file  . Transportation needs - non-medical: Not on file  Occupational History  . Not on file  Tobacco Use  . Smoking status: Never Smoker  . Smokeless tobacco: Never Used  Substance and Sexual Activity  . Alcohol use: No  . Drug use: No  . Sexual activity: Not Currently  Other Topics Concern  . Not on file  Social History Narrative  . 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 Colon polyps Father   . Prostate cancer Father   . ALS Father   . Thyroid disease  Sister   . Thyroid nodules Sister        Thyroidectomy in 1984  . Memory loss Mother   . Fibroids Sister   . Hypertension Sister   . Liver cancer Paternal Grandmother    indicated that her mother is alive. She indicated that her father is deceased. She indicated that both of her sisters are alive. She indicated that the status of her paternal grandmother is unknown.   ALLERGIES:  has No Known Allergies.  MEDICATIONS:  Current Outpatient Medications  Medication Sig Dispense Refill  . b complex vitamins tablet Take 1 tablet by mouth daily. Reported on 10/21/2015    . Cholecalciferol (VITAMIN D3) 1000 UNITS CAPS Take by mouth.      .  clorazepate (TRANXENE) 7.5 MG tablet Take 1 tablet (7.5 mg total) by mouth 2 (two) times daily as needed. 60 tablet 1  . fish oil-omega-3 fatty acids 1000 MG capsule Take 1 g by mouth daily.    . GARLIC PO Take by mouth.    . hydrochlorothiazide (HYDRODIURIL) 25 MG tablet TAKE ONE TABLET BY MOUTH ONCE DAILY 90 tablet 1  . Nutritional Supplements (NUTRITIONAL SUPPLEMENT PO) Take by mouth. Psyllium Husk    . Polyethylene Glycol 3350 (MIRALAX PO) Take by mouth daily as needed.     . Potassium 99 MG TABS Take by mouth daily.      . ranitidine (ZANTAC) 150 MG capsule Take 150 mg by mouth every evening.    . sertraline (ZOLOFT) 50 MG tablet TAKE ONE TABLET BY MOUTH ONCE DAILY 90 tablet 1  . Trace Min CaCrCuFeKMgMnPSeZn (MINERALS PO) Take 2-4 drops by mouth every 14 (fourteen) days.    . TURMERIC PO Take 1 tablet by mouth daily.    . vitamin C (ASCORBIC ACID) 500 MG tablet Take 500 mg by mouth daily.     No current facility-administered medications for this visit.     Review of Systems  Constitutional: Negative.   HENT: Negative.   Eyes: Negative.   Respiratory: Negative.  Negative for shortness of breath.   Cardiovascular: Negative.  Negative for chest pain.  Gastrointestinal: Negative for abdominal pain.  Genitourinary: Negative.   Musculoskeletal: Negative.   Skin: Negative.   Neurological: Negative.   Endo/Heme/Allergies: Negative.   All other systems reviewed and are negative.  14 point ROS was done and is otherwise as detailed above or in HPI   PHYSICAL EXAMINATION: ECOG PERFORMANCE STATUS: 0 - Asymptomatic  There were no vitals filed for this visit. There were no vitals filed for this visit.    Physical Exam  Constitutional: She is oriented to person, place, and time and well-developed, well-nourished, and in no distress.  HENT:  Head: Normocephalic and atraumatic.  Nose: Nose normal.  Mouth/Throat: Oropharynx is clear and moist. No oropharyngeal exudate.  Eyes:  Conjunctivae and EOM are normal. Pupils are equal, round, and reactive to light. Right eye exhibits no discharge. Left eye exhibits no discharge. No scleral icterus.  Neck: Normal range of motion. Neck supple. No tracheal deviation present. No thyromegaly present.  Cardiovascular: Normal rate, regular rhythm and normal heart sounds. Exam reveals no gallop and no friction rub.  No murmur heard. Pulmonary/Chest: Effort normal and breath sounds normal. She has no wheezes. She has no rales.  Abdominal: Soft. Bowel sounds are normal. She exhibits no distension and no mass. There is no tenderness. There is no rebound and no guarding.  Musculoskeletal: Normal range of motion. She exhibits no edema.    Lymphadenopathy:    She has no cervical adenopathy.  Neurological: She is alert and oriented to person, place, and time. She has normal reflexes. No cranial nerve deficit. Gait normal. Coordination normal.  Skin: Skin is warm and dry. No rash noted.  Psychiatric: Mood, memory, affect and judgment normal.  Nursing note and vitals reviewed.   LABORATORY DATA:  I have reviewed the data as listed Lab Results  Component Value Date   WBC 4.2 04/28/2017   HGB 13.1 04/28/2017   HCT 39.7 04/28/2017   MCV 94.3 04/28/2017   PLT 205 04/28/2017             RADIOGRAPHIC STUDIES: I have personally reviewed the radiological images as listed and agreed with the findings in the report. Study Result   CLINICAL DATA:  Screening.  EXAM: 2D DIGITAL SCREENING BILATERAL MAMMOGRAM WITH CAD AND ADJUNCT TOMO  COMPARISON:  Previous exam(s).  ACR Breast Density Category a: The breast tissue is almost entirely fatty.  FINDINGS: There are no findings suspicious for malignancy. Images were processed with CAD.  IMPRESSION: No mammographic evidence of malignancy. A result letter of this screening mammogram will be mailed directly to the patient.  RECOMMENDATION: Screening mammogram in one year.  (Code:SM-B-01Y)  BI-RADS CATEGORY  1: Negative.   Electronically Signed   By: Margarette Canada M.D.   On: 04/01/2016 14:52   METASTATIC BONE SURVEY 10/20/2016  IMPRESSION: No new or enlarging suspicious lytic lesions. Questionable subtle very small lucency in the proximal left humerus is stable. No definite evidence for multiple myeloma or metastatic disease.  PATHOLOGY    ASSESSMENT & PLAN:  Right shoulder film on 08/21/2015 with oval areas of lucency in the humeral head and neck as well as in the scapula. R shoulder pain Abnormal kappa/lamda light chain ratio, serum Bence jones proteinuria BMBX 10/06/2015 trilineage hematopoiesis, plasmacytosis 6%, polyclonal  SIEP and light chain is pending.  CBC and comprehensive metabolic panel is within normal limits (November, 2018) IF ALL blood tests are within normal limits we will repeat SIEP light chain in 1 year and reevaluate patient.  All questions were answered. The patient knows to call the clinic with any problems, questions or concerns.  This document serves as a record of services personally performed by Twana First, MD. It was created on her behalf by Shirlean Mylar, a trained medical scribe. The creation of this record is based on the scribe's personal observations and the provider's statements to them. This document has been checked and approved by the attending provider.  I have reviewed the above documentation for accuracy and completeness, and I agree with the above.  This note was electronically signed.  Forest Gleason, MD  04/28/2017 2:09 PM

## 2017-04-29 LAB — BETA 2 MICROGLOBULIN, SERUM: Beta-2 Microglobulin: 1.5 mg/L (ref 0.6–2.4)

## 2017-05-01 ENCOUNTER — Other Ambulatory Visit: Payer: Self-pay | Admitting: Family Medicine

## 2017-05-01 DIAGNOSIS — Z1231 Encounter for screening mammogram for malignant neoplasm of breast: Secondary | ICD-10-CM

## 2017-05-01 LAB — IMMUNOFIXATION ELECTROPHORESIS
IgA: 162 mg/dL (ref 87–352)
IgG (Immunoglobin G), Serum: 1325 mg/dL (ref 700–1600)
IgM (Immunoglobulin M), Srm: 41 mg/dL (ref 26–217)
Total Protein ELP: 7 g/dL (ref 6.0–8.5)

## 2017-05-01 LAB — PROTEIN ELECTROPHORESIS, SERUM
A/G Ratio: 1.2 (ref 0.7–1.7)
Albumin ELP: 3.8 g/dL (ref 2.9–4.4)
Alpha-1-Globulin: 0.2 g/dL (ref 0.0–0.4)
Alpha-2-Globulin: 0.7 g/dL (ref 0.4–1.0)
Beta Globulin: 0.9 g/dL (ref 0.7–1.3)
Gamma Globulin: 1.3 g/dL (ref 0.4–1.8)
Globulin, Total: 3.1 g/dL (ref 2.2–3.9)
Total Protein ELP: 6.9 g/dL (ref 6.0–8.5)

## 2017-05-01 LAB — KAPPA/LAMBDA LIGHT CHAINS
Kappa free light chain: 18.8 mg/L (ref 3.3–19.4)
Kappa, lambda light chain ratio: 1.81 — ABNORMAL HIGH (ref 0.26–1.65)
Lambda free light chains: 10.4 mg/L (ref 5.7–26.3)

## 2017-05-02 ENCOUNTER — Other Ambulatory Visit: Payer: Self-pay | Admitting: *Deleted

## 2017-05-02 ENCOUNTER — Ambulatory Visit: Payer: Medicare Other | Admitting: Gastroenterology

## 2017-05-02 ENCOUNTER — Encounter: Payer: Self-pay | Admitting: Gastroenterology

## 2017-05-02 ENCOUNTER — Ambulatory Visit (INDEPENDENT_AMBULATORY_CARE_PROVIDER_SITE_OTHER): Payer: Medicare Other | Admitting: Gastroenterology

## 2017-05-02 VITALS — BP 120/62 | HR 84 | Ht 67.0 in | Wt 182.0 lb

## 2017-05-02 DIAGNOSIS — K648 Other hemorrhoids: Secondary | ICD-10-CM | POA: Diagnosis not present

## 2017-05-02 DIAGNOSIS — Z1211 Encounter for screening for malignant neoplasm of colon: Secondary | ICD-10-CM

## 2017-05-02 MED ORDER — NA SULFATE-K SULFATE-MG SULF 17.5-3.13-1.6 GM/177ML PO SOLN: 1.0000 | Freq: Once | ORAL | 0 refills | Status: AC

## 2017-05-02 NOTE — Progress Notes (Signed)
Tiffany Mendoza    [id number]    Mar 31, 1951  Primary Care Physician:[name], Norwood Levo, MD  Referring Physician: Fayrene Helper, MD [address] [place], [place] [address]  Chief complaint: Rectal pain  HPI: 66 year old female previously followed by Dr. Sharlett Iles and Dr. Carlean Purl here with complaints of rectal discomfort and intermittent bright red blood per rectum.  Patient requested to switch providers.  She saw Dr. Carlean Purl November 2015.  Complaints of sensation of something protruding with burning and mild discomfort worse after bowel movement when she strains excessively.  Occasionally she notices small streak of bright red blood when she wipes.  She has constipation on and off, tries to drink adequate fluids and eats high-fiber diet.  She had a hemorrhoidectomy by Dr. Rosana Hoes 10-15 years ago.  Last colonoscopy by Dr. Sharlett Iles in September 2008 showed very redundant and tortuous colon with medium sized internal hemorrhoids otherwise unremarkable exam  Outpatient Encounter Medications as of 05/02/2017  Medication Sig  . clorazepate (TRANXENE) 7.5 MG tablet Take 1 tablet (7.5 mg total) by mouth 2 (two) times daily as needed.  . hydrochlorothiazide (HYDRODIURIL) 25 MG tablet TAKE ONE TABLET BY MOUTH ONCE DAILY  . Polyethylene Glycol 3350 (MIRALAX PO) Take by mouth daily as needed.   . Potassium 99 MG TABS Take by mouth daily.    . ranitidine (ZANTAC) 150 MG capsule Take 150 mg as needed by mouth.   . sertraline (ZOLOFT) 50 MG tablet TAKE ONE TABLET BY MOUTH ONCE DAILY  . Trace Min CaCrCuFeKMgMnPSeZn (MINERALS PO) Take 2-4 drops by mouth every 14 (fourteen) days.  Marland Kitchen b complex vitamins tablet Take 1 tablet by mouth daily. Reported on 10/21/2015  . Cholecalciferol (VITAMIN D3) 1000 UNITS CAPS Take by mouth.    . fish oil-omega-3 fatty acids 1000 MG capsule Take 1 g by mouth daily.  Marland Kitchen GARLIC PO Take by mouth.  . [EXPIRED] Na Sulfate-K Sulfate-Mg Sulf (SUPREP  BOWEL PREP KIT) 17.5-3.13-1.6 GM/177ML SOLN Take 1 kit once for 1 dose by mouth.  . Nutritional Supplements (NUTRITIONAL SUPPLEMENT PO) Take by mouth. Psyllium Husk  . TURMERIC PO Take 1 tablet by mouth daily.  . vitamin C (ASCORBIC ACID) 500 MG tablet Take 500 mg by mouth daily.   No facility-administered encounter medications on file as of 05/02/2017.     Allergies as of 05/02/2017  . (No Known Allergies)    Past Medical History:  Diagnosis Date  . Adjustment disorder with mixed anxiety and depressed mood   . Allergy   . Carotid bruit   . Depression   . Dermatophytosis of foot   . GERD (gastroesophageal reflux disease)   . Hypertension   . Insomnia   . Neck pain   . Osteoporosis     Past Surgical History:  Procedure Laterality Date  . ABDOMINAL HYSTERECTOMY  1988  . HEMORRHOID SURGERY    . myoectomy      Family History  Problem Relation Age of Onset  . Colon polyps Father   . Prostate cancer Father   . ALS Father   . Thyroid disease Sister   . Thyroid nodules Sister        Thyroidectomy in 1984  . Memory loss Mother   . Fibroids Sister   . Hypertension Sister   . Liver cancer Paternal Grandmother     Social History   Socioeconomic History  . Marital status: Divorced    Spouse  name: Not on file  . Number of children: Not on file  . Years of education: Not on file  . Highest education level: Not on file  Social Needs  . Financial resource strain: Not on file  . Food insecurity - worry: Not on file  . Food insecurity - inability: Not on file  . Transportation needs - medical: Not on file  . Transportation needs - non-medical: Not on file  Occupational History  . Not on file  Tobacco Use  . Smoking status: Never Smoker  . Smokeless tobacco: Never Used  Substance and Sexual Activity  . Alcohol use: No  . Drug use: No  . Sexual activity: Not Currently  Other Topics Concern  . Not on file  Social History Narrative  . Not on file      Review of  systems: Review of Systems  Constitutional: Negative for fever and chills.  HENT: Negative.   Eyes: Negative for blurred vision.  Respiratory: Negative for cough, shortness of breath and wheezing.   Cardiovascular: Negative for chest pain and palpitations.  Gastrointestinal: as per HPI Genitourinary: Negative for dysuria, urgency, frequency and hematuria.  Musculoskeletal: Negative for myalgias, back pain and joint pain.  Skin: Negative for itching and rash.  Neurological: Negative for dizziness, tremors, focal weakness, seizures and loss of consciousness.  Endo/Heme/Allergies: Positive for seasonal allergies.  Psychiatric/Behavioral: Negative for depression, suicidal ideas and hallucinations.  All other systems reviewed and are negative.   Physical Exam: Vitals:   05/02/17 1355  BP: 120/62  Pulse: 84   Body mass index is 28.51 kg/m. Gen:      No acute distress HEENT:  EOMI, sclera anicteric Neck:     No masses; no thyromegaly Lungs:    Clear to auscultation bilaterally; normal respiratory effort CV:         Regular rate and rhythm; no murmurs Abd:      + bowel sounds; soft, non-tender; no palpable masses, no distension Ext:    No edema; adequate peripheral perfusion Skin:      Warm and dry; no rash Neuro: alert and oriented x 3 Psych: normal mood and affect Rectal exam: Normal anal sphincter tone, no anal fissure or external hemorrhoids Anoscopy: Small internal hemorrhoids, no active bleeding, normal dentate line, no visible nodules' Data Reviewed:  Reviewed labs, radiology imaging, old records and pertinent past GI work up Colonoscopy September 2008: External hemorrhoids otherwise normal colonoscopy  Assessment and Plan/Recommendations: 66 year old female here with symptomatic hemorrhoids, and no anal fissure on exam Advised patient to avoid excessive straining Increase dietary fiber and fluid intake Soluble fiber 2-3 times daily with meals  Patient is due for  colorectal cancer screening we will schedule it The risks and benefits as well as alternatives of endoscopic procedure(s) have been discussed and reviewed. All questions answered. The patient agrees to proceed.   Damaris Hippo , MD [phone] Mon-Fri 8a-5p [phone] after 5p, weekends, holidays  CC: Fayrene Helper, MD

## 2017-05-02 NOTE — Patient Instructions (Signed)
You have been scheduled for a colonoscopy. Please follow written instructions given to you at your visit today.  Please pick up your prep supplies at the pharmacy within the next 1-3 days. If you use inhalers (even only as needed), please bring them with you on the day of your procedure.    Hemorrhoids Hemorrhoids are swollen veins in and around the rectum or anus. There are two types of hemorrhoids:  Internal hemorrhoids. These occur in the veins that are just inside the rectum. They may poke through to the outside and become irritated and painful.  External hemorrhoids. These occur in the veins that are outside of the anus and can be felt as a painful swelling or hard lump near the anus.  Most hemorrhoids do not cause serious problems, and they can be managed with home treatments such as diet and lifestyle changes. If home treatments do not help your symptoms, procedures can be done to shrink or remove the hemorrhoids. What are the causes? This condition is caused by increased pressure in the anal area. This pressure may result from various things, including:  Constipation.  Straining to have a bowel movement.  Diarrhea.  Pregnancy.  Obesity.  Sitting for long periods of time.  Heavy lifting or other activity that causes you to strain.  Anal sex.  What are the signs or symptoms? Symptoms of this condition include:  Pain.  Anal itching or irritation.  Rectal bleeding.  Leakage of stool (feces).  Anal swelling.  One or more lumps around the anus.  How is this diagnosed? This condition can often be diagnosed through a visual exam. Other exams or tests may also be done, such as:  Examination of the rectal area with a gloved hand (digital rectal exam).  Examination of the anal canal using a small tube (anoscope).  A blood test, if you have lost a significant amount of blood.  A test to look inside the colon (sigmoidoscopy or colonoscopy).  How is this  treated? This condition can usually be treated at home. However, various procedures may be done if dietary changes, lifestyle changes, and other home treatments do not help your symptoms. These procedures can help make the hemorrhoids smaller or remove them completely. Some of these procedures involve surgery, and others do not. Common procedures include:  Rubber band ligation. Rubber bands are placed at the base of the hemorrhoids to cut off the blood supply to them.  Sclerotherapy. Medicine is injected into the hemorrhoids to shrink them.  Infrared coagulation. A type of light energy is used to get rid of the hemorrhoids.  Hemorrhoidectomy surgery. The hemorrhoids are surgically removed, and the veins that supply them are tied off.  Stapled hemorrhoidopexy surgery. A circular stapling device is used to remove the hemorrhoids and use staples to cut off the blood supply to them.  Follow these instructions at home: Eating and drinking  Eat foods that have a lot of fiber in them, such as whole grains, beans, nuts, fruits, and vegetables. Ask your health care provider about taking products that have added fiber (fiber supplements).  Drink enough fluid to keep your urine clear or pale yellow. Managing pain and swelling  Take warm sitz baths for 20 minutes, 3-4 times a day to ease pain and discomfort.  If directed, apply ice to the affected area. Using ice packs between sitz baths may be helpful. ? Put ice in a plastic bag. ? Place a towel between your skin and the bag. ? Leave  the ice on for 20 minutes, 2-3 times a day. General instructions  Take over-the-counter and prescription medicines only as told by your health care provider.  Use medicated creams or suppositories as told.  Exercise regularly.  Go to the bathroom when you have the urge to have a bowel movement. Do not wait.  Avoid straining to have bowel movements.  Keep the anal area dry and clean. Use wet toilet paper or  moist towelettes after a bowel movement.  Do not sit on the toilet for long periods of time. This increases blood pooling and pain. Contact a health care provider if:  You have increasing pain and swelling that are not controlled by treatment or medicine.  You have uncontrolled bleeding.  You have difficulty having a bowel movement, or you are unable to have a bowel movement.  You have pain or inflammation outside the area of the hemorrhoids. This information is not intended to replace advice given to you by your health care provider. Make sure you discuss any questions you have with your health care provider. Document Released: 06/03/2000 Document Revised: 11/04/2015 Document Reviewed: 02/18/2015 Elsevier Interactive Patient Education  2017 [place].   High-Fiber Diet Fiber, also called dietary fiber, is a type of carbohydrate found in fruits, vegetables, whole grains, and beans. A high-fiber diet can have many health benefits. Your health care provider may recommend a high-fiber diet to help:  Prevent constipation. Fiber can make your bowel movements more regular.  Lower your cholesterol.  Relieve hemorrhoids, uncomplicated diverticulosis, or irritable bowel syndrome.  Prevent overeating as part of a weight-loss plan.  Prevent heart disease, type 2 diabetes, and certain cancers.  What is my plan? The recommended daily intake of fiber includes:  38 grams for men under age 53.  15 grams for men over age 2.  65 grams for women under age 55.  68 grams for women over age 45.  You can get the recommended daily intake of dietary fiber by eating a variety of fruits, vegetables, grains, and beans. Your health care provider may also recommend a fiber supplement if it is not possible to get enough fiber through your diet. What do I need to know about a high-fiber diet?  Fiber supplements have not been widely studied for their effectiveness, so it is better to get fiber  through food sources.  Always check the fiber content on thenutrition facts label of any prepackaged food. Look for foods that contain at least 5 grams of fiber per serving.  Ask your dietitian if you have questions about specific foods that are related to your condition, especially if those foods are not listed in the following section.  Increase your daily fiber consumption gradually. Increasing your intake of dietary fiber too quickly may cause bloating, cramping, or gas.  Drink plenty of water. Water helps you to digest fiber. What foods can I eat? Grains Whole-grain breads. Multigrain cereal. Oats and oatmeal. Brown rice. Barley. Bulgur wheat. Coral. Bran muffins. Popcorn. Rye wafer crackers. Vegetables Sweet potatoes. Spinach. Kale. Artichokes. Cabbage. Broccoli. Green peas. Carrots. Squash. Fruits Berries. Pears. Apples. Oranges. Avocados. Prunes and raisins. Dried figs. Meats and Other Protein Sources Navy, kidney, pinto, and soy beans. Split peas. Lentils. Nuts and seeds. Dairy Fiber-fortified yogurt. Beverages Fiber-fortified soy milk. Fiber-fortified orange juice. Other Fiber bars. The items listed above may not be a complete list of recommended foods or beverages. Contact your dietitian for more options. What foods are not recommended? Grains White bread. Pasta made  with refined flour. White rice. Vegetables Fried potatoes. Canned vegetables. Well-cooked vegetables. Fruits Fruit juice. Cooked, strained fruit. Meats and Other Protein Sources Fatty cuts of meat. Fried Sales executive or fried fish. Dairy Milk. Yogurt. Cream cheese. Sour cream. Beverages Soft drinks. Other Cakes and pastries. Butter and oils. The items listed above may not be a complete list of foods and beverages to avoid. Contact your dietitian for more information. What are some tips for including high-fiber foods in my diet?  Eat a wide variety of high-fiber foods.  Make sure that half of all  grains consumed each day are whole grains.  Replace breads and cereals made from refined flour or white flour with whole-grain breads and cereals.  Replace white rice with brown rice, bulgur wheat, or millet.  Start the day with a breakfast that is high in fiber, such as a cereal that contains at least 5 grams of fiber per serving.  Use beans in place of meat in soups, salads, or pasta.  Eat high-fiber snacks, such as berries, raw vegetables, nuts, or popcorn. This information is not intended to replace advice given to you by your health care provider. Make sure you discuss any questions you have with your health care provider. Document Released: 06/06/2005 Document Revised: 11/12/2015 Document Reviewed: 11/19/2013 Elsevier Interactive Patient Education  2017 [employer].

## 2017-05-10 ENCOUNTER — Ambulatory Visit (HOSPITAL_COMMUNITY)
Admission: RE | Admit: 2017-05-10 | Discharge: 2017-05-10 | Disposition: A | Discharge status: Home / Self Care | Payer: Medicare Other | Source: Ambulatory Visit | Attending: Family Medicine | Admitting: Family Medicine

## 2017-05-10 ENCOUNTER — Encounter (HOSPITAL_COMMUNITY): Payer: Self-pay

## 2017-05-10 DIAGNOSIS — Z1231 Encounter for screening mammogram for malignant neoplasm of breast: Secondary | ICD-10-CM | POA: Diagnosis not present

## 2017-05-23 ENCOUNTER — Encounter: Payer: Self-pay | Admitting: Family Medicine

## 2017-05-24 ENCOUNTER — Ambulatory Visit (INDEPENDENT_AMBULATORY_CARE_PROVIDER_SITE_OTHER): Payer: Medicare Other | Admitting: Family Medicine

## 2017-05-24 ENCOUNTER — Encounter: Payer: Self-pay | Admitting: Family Medicine

## 2017-05-24 VITALS — BP 114/78 | HR 72 | Resp 16 | Ht 67.0 in | Wt 179.4 lb

## 2017-05-24 DIAGNOSIS — I10 Essential (primary) hypertension: Secondary | ICD-10-CM

## 2017-05-24 DIAGNOSIS — K219 Gastro-esophageal reflux disease without esophagitis: Secondary | ICD-10-CM

## 2017-05-24 DIAGNOSIS — F5105 Insomnia due to other mental disorder: Secondary | ICD-10-CM | POA: Diagnosis not present

## 2017-05-24 DIAGNOSIS — F418 Other specified anxiety disorders: Secondary | ICD-10-CM

## 2017-05-24 DIAGNOSIS — F419 Anxiety disorder, unspecified: Secondary | ICD-10-CM | POA: Diagnosis not present

## 2017-05-24 DIAGNOSIS — Z23 Encounter for immunization: Secondary | ICD-10-CM | POA: Diagnosis not present

## 2017-05-24 DIAGNOSIS — R7301 Impaired fasting glucose: Secondary | ICD-10-CM | POA: Diagnosis not present

## 2017-05-24 MED ORDER — HYDROCHLOROTHIAZIDE 25 MG PO TABS: 25.0000 mg | ORAL_TABLET | Freq: Every day | ORAL | 1 refills | Status: DC

## 2017-05-24 MED ORDER — SERTRALINE HCL 50 MG PO TABS: 50.0000 mg | ORAL_TABLET | Freq: Every day | ORAL | 1 refills | Status: DC

## 2017-05-24 MED ORDER — CLORAZEPATE DIPOTASSIUM 7.5 MG PO TABS: 7.5000 mg | ORAL_TABLET | Freq: Two times a day (BID) | ORAL | 1 refills | Status: DC | PRN

## 2017-05-24 NOTE — Patient Instructions (Addendum)
Wellness with nurse March 15 or after, call if you need me sooner   Flu vaccine today  Continue to use all the resources that you have available to you as you handle the  stress and anxiety , and hurt that you report feeling now, this WIILL improve    Fasting lipid, chem 7, TSH and vitamin D level first week in January  Thank you  for choosing [facility]. We consider it a privelige to serve you.  Delivering excellent health care in a caring and  compassionate way is our goal.  Partnering with you,  so that together we can achieve this goal is our strategy.    All the best for 2019!

## 2017-05-28 NOTE — Progress Notes (Signed)
Tiffany Mendoza     MRN: [id number]      DOB: 07/20/1950   HPI Tiffany Mendoza is here for follow up and re-evaluation of chronic medical conditions, medication management and review of any available recent lab and radiology data.  Preventive health is updated, specifically  Cancer screening and Immunization.   Questions or concerns regarding consultations or procedures which the PT has had in the interim are  addressed. The PT denies any adverse reactions to current medications since the last visit.  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 Denies sinus pressure, nasal congestion, ear pain or sore throat. Denies chest congestion, productive cough or wheezing. Denies chest pains, palpitations and leg swelling Denies abdominal pain, nausea, vomiting,diarrhea or constipation.   Denies dysuria, frequency, hesitancy or incontinence. Denies joint pain, swelling and limitation in mobility. Denies headaches, seizures, numbness, or tingling. Denies skin break down or rash.   PE  BP 114/78   Pulse 72   Resp 16   Ht 5\' 7"  (1.702 m)   Wt 179 lb 6.4 oz (81.4 kg)   SpO2 94%   BMI 28.10 kg/m   Patient alert and oriented and in no cardiopulmonary distress.  HEENT: No facial asymmetry, EOMI,   oropharynx pink and moist.  Neck supple no JVD, no mass.  Chest: Clear to auscultation bilaterally.  CVS: S1, S2 no murmurs, no S3.Regular rate.  ABD: Soft non tender.   Ext: No edema  MS: Adequate ROM spine, shoulders, hips and knees.  Skin: Intact, no ulcerations or rash noted.  Psych:and r depressed appearing.  CNS: CN 2-12 intact, power,  normal throughout.no focal deficits noted.   Assessment & Plan Essential hypertension Controlled, no change in medication DASH  diet and commitment to daily physical activity for a minimum of 30 minutes discussed and encouraged, as a part of hypertension management. The importance of attaining a healthy weight is also discussed.  BP/Weight 05/24/2017 05/02/2017 04/28/2017 10/26/2016 10/20/2016 08/31/2016 70/62/3762  Systolic BP 831 517 616 073 710 626 948  Diastolic BP 78 62 81 80 73 88 84  Wt. (Lbs) 179.4 182 180.6 179 178.2 177.04 176.04  BMI 28.1 28.51 28.29 28.04 27.91 27.73 27.57       Insomnia secondary to anxiety Sleep hygiene reviewed and written information offered also. Prescription sent for  medication needed.   IFG (impaired fasting glucose) Patient educated about the importance of limiting  Carbohydrate intake , the need to commit to daily physical activity for a minimum of 30 minutes , and to commit weight loss. The fact that changes in all these areas will reduce or eliminate all together the development of diabetes is stressed.  Corrected  Diabetic Labs Latest Ref Rng & Units 04/28/2017 10/20/2016 06/01/2016 04/22/2016 12/17/2015  HbA1c <5.7 % - - 5.4 - 5.7(H)  Chol <200 mg/dL - - 169 - -  HDL >50 mg/dL - - 58 - -  Calc LDL <100 mg/dL - - 96 - -  Triglycerides <150 mg/dL - - 75 - -  Creatinine 0.44 - 1.00 mg/dL 0.80 0.74 0.80 0.67 0.78   BP/Weight 05/24/2017 05/02/2017 04/28/2017 10/26/2016 10/20/2016 08/31/2016 54/62/7035  Systolic BP 009 381 829 937 169 678 938  Diastolic BP 78 62 81 80 73  88 84  Wt. (Lbs) 179.4 182 180.6 179 178.2 177.04 176.04  BMI 28.1 28.51 28.29 28.04 27.91 27.73 27.57   No flowsheet data found.    Depression with anxiety Increased , not suicidal or homicidal. She is encouraged toi get therapy and states that shje is getting some she will continue current medication  GERD Controlled, no change in medication

## 2017-05-28 NOTE — Assessment & Plan Note (Signed)
Patient educated about the importance of limiting  Carbohydrate intake , the need to commit to daily physical activity for a minimum of 30 minutes , and to commit weight loss. The fact that changes in all these areas will reduce or eliminate all together the development of diabetes is stressed.  Corrected  Diabetic Labs Latest Ref Rng & Units 04/28/2017 10/20/2016 06/01/2016 04/22/2016 12/17/2015  HbA1c <5.7 % - - 5.4 - 5.7(H)  Chol <200 mg/dL - - 169 - -  HDL >50 mg/dL - - 58 - -  Calc LDL <100 mg/dL - - 96 - -  Triglycerides <150 mg/dL - - 75 - -  Creatinine 0.44 - 1.00 mg/dL 0.80 0.74 0.80 0.67 0.78   BP/Weight 05/24/2017 05/02/2017 04/28/2017 10/26/2016 10/20/2016 08/31/2016 74/94/4967  Systolic BP 591 638 466 599 357 017 793  Diastolic BP 78 62 81 80 73 88 84  Wt. (Lbs) 179.4 182 180.6 179 178.2 177.04 176.04  BMI 28.1 28.51 28.29 28.04 27.91 27.73 27.57   No flowsheet data found.

## 2017-05-28 NOTE — Assessment & Plan Note (Signed)
Sleep hygiene reviewed and written information offered also. Prescription sent for  medication needed.  

## 2017-05-28 NOTE — Assessment & Plan Note (Signed)
Controlled, no change in medication  

## 2017-05-28 NOTE — Assessment & Plan Note (Signed)
Increased , not suicidal or homicidal. She is encouraged toi get therapy and states that shje is getting some she will continue current medication

## 2017-05-28 NOTE — Assessment & Plan Note (Signed)
Controlled, no change in medication DASH diet and commitment to daily physical activity for a minimum of 30 minutes discussed and encouraged, as a part of hypertension management. The importance of attaining a healthy weight is also discussed.  BP/Weight 05/24/2017 05/02/2017 04/28/2017 10/26/2016 10/20/2016 08/31/2016 93/24/1991  Systolic BP 444 584 835 075 732 256 720  Diastolic BP 78 62 81 80 73 88 84  Wt. (Lbs) 179.4 182 180.6 179 178.2 177.04 176.04  BMI 28.1 28.51 28.29 28.04 27.91 27.73 27.57

## 2017-06-07 ENCOUNTER — Telehealth: Payer: Self-pay | Admitting: Gastroenterology

## 2017-06-07 NOTE — Telephone Encounter (Signed)
Her last colonoscopy note says Fentanyl and Versed. Called back. Got her voicemail. DPR on file. Left the information on her voicemail. Also suggested if she had issues, she let the admitting nurses and nurse anesthetist know what issues she had.

## 2017-06-15 ENCOUNTER — Encounter: Payer: Self-pay | Admitting: Gastroenterology

## 2017-06-15 ENCOUNTER — Ambulatory Visit (AMBULATORY_SURGERY_CENTER): Payer: Medicare Other | Admitting: Gastroenterology

## 2017-06-15 VITALS — BP 133/84 | HR 61 | Temp 96.2°F | Resp 17 | Ht 67.0 in | Wt 179.0 lb

## 2017-06-15 DIAGNOSIS — Z1211 Encounter for screening for malignant neoplasm of colon: Secondary | ICD-10-CM

## 2017-06-15 DIAGNOSIS — D128 Benign neoplasm of rectum: Secondary | ICD-10-CM

## 2017-06-15 DIAGNOSIS — K621 Rectal polyp: Secondary | ICD-10-CM | POA: Diagnosis not present

## 2017-06-15 DIAGNOSIS — Z1212 Encounter for screening for malignant neoplasm of rectum: Secondary | ICD-10-CM | POA: Diagnosis not present

## 2017-06-15 MED ORDER — SODIUM CHLORIDE 0.9 % IV SOLN
500.0000 mL | INTRAVENOUS | Status: DC
Start: 2017-06-15 — End: 2017-06-15

## 2017-06-15 NOTE — Progress Notes (Signed)
To recovery, report to RN, VSS. 

## 2017-06-15 NOTE — Patient Instructions (Addendum)
YOU HAD AN ENDOSCOPIC PROCEDURE TODAY AT [facility]:   Refer to the procedure report that was given to you for any specific questions about what was found during the examination.  If the procedure report does not answer your questions, please call your gastroenterologist to clarify.  If you requested that your care partner not be given the details of your procedure findings, then the procedure report has been included in a sealed envelope for you to review at your convenience later.  YOU SHOULD EXPECT: Some feelings of bloating in the abdomen. Passage of more gas than usual.  Walking can help get rid of the air that was put into your GI tract during the procedure and reduce the bloating. If you had a lower endoscopy (such as a colonoscopy or flexible sigmoidoscopy) you may notice spotting of blood in your stool or on the toilet paper. If you underwent a bowel prep for your procedure, you may not have a normal bowel movement for a few days.  Please Note:  You might notice some irritation and congestion in your nose or some drainage.  This is from the oxygen used during your procedure.  There is no need for concern and it should clear up in a day or so.  SYMPTOMS TO REPORT IMMEDIATELY:   Following lower endoscopy (colonoscopy or flexible sigmoidoscopy):  Excessive amounts of blood in the stool  Significant tenderness or worsening of abdominal pains  Swelling of the abdomen that is new, acute  Fever of 100F or higher  For urgent or emergent issues, a gastroenterologist can be reached at any hour by calling [phone].   DIET:  We do recommend a small meal at first, but then you may proceed to your regular diet.  Drink plenty of fluids but you should avoid alcoholic beverages for 24 hours.  ACTIVITY:  You should plan to take it easy for the rest of today and you should NOT DRIVE or use heavy machinery until tomorrow (because of the sedation medicines used during the test).     FOLLOW UP: Our staff will call the number listed on your records the next business day following your procedure to check on you and address any questions or concerns that you may have regarding the information given to you following your procedure. If we do not reach you, we will leave a message.  However, if you are feeling well and you are not experiencing any problems, there is no need to return our call.  We will assume that you have returned to your regular daily activities without incident.  If any biopsies were taken you will be contacted by phone or by letter within the next 1-3 weeks.  Please call us at [phone] if you have not heard about the biopsies in 3 weeks.   Await for biopsy results to determine next repeat Colonoscopy screening Polyps (handout given) Hemorrhoids (handout given) Hemorrhoid Banding (handout given and office will call to make a Hemorrhoid banding appointment) Diverticulosis (handout given)   SIGNATURES/CONFIDENTIALITY: You and/or your care partner have signed paperwork which will be entered into your electronic medical record.  These signatures attest to the fact that that the information above on your After Visit Summary has been reviewed and is understood.  Full responsibility of the confidentiality of this discharge information lies with you and/or your care-partner.

## 2017-06-15 NOTE — Progress Notes (Signed)
Pt's states no medical or surgical changes since previsit or office visit. 

## 2017-06-15 NOTE — Progress Notes (Signed)
Called to room to assist during endoscopic procedure.  Patient ID and intended procedure confirmed with present staff. Received instructions for my participation in the procedure from the performing physician.  

## 2017-06-15 NOTE — Progress Notes (Signed)
Verbal order given to call Beth to make a Hemorrhoid banding appointment. Called to notify Beth x 2. Left a message for Beth to follow up.

## 2017-06-15 NOTE — Op Note (Signed)
[facility] Patient Name: Tiffany Mendoza Procedure Date: 06/15/2017 8:21 AM MRN: [id number] Endoscopist: Mauri Pole , MD Age: 66 Referring MD:  Date of Birth: 22-Dec-1950 Gender: Female Account #: [id number] Procedure:                Colonoscopy Indications:              Screening for colorectal malignant neoplasm Medicines:                Monitored Anesthesia Care Procedure:                Pre-Anesthesia Assessment:                           - Prior to the procedure, a History and Physical                            was performed, and patient medications and                            allergies were reviewed. The patient's tolerance of                            previous anesthesia was also reviewed. The risks                            and benefits of the procedure and the sedation                            options and risks were discussed with the patient.                            All questions were answered, and informed consent                            was obtained. Prior Anticoagulants: The patient has                            taken no previous anticoagulant or antiplatelet                            agents. ASA Grade Assessment: II - A patient with                            mild systemic disease. After reviewing the risks                            and benefits, the patient was deemed in                            satisfactory condition to undergo the procedure.                           After obtaining informed consent, the colonoscope  was passed under direct vision. Throughout the                            procedure, the patient's blood pressure, pulse, and                            oxygen saturations were monitored continuously. The                            Colonoscope was introduced through the anus and                            advanced to the the cecum, identified by                            appendiceal orifice  and ileocecal valve. The                            colonoscopy was technically difficult and complex                            due to significant looping and a tortuous colon.                            Successful completion of the procedure was aided by                            applying abdominal pressure. The patient tolerated                            the procedure well. The quality of the bowel                            preparation was good. The ileocecal valve,                            appendiceal orifice, and rectum were photographed. Scope In: 8:21:10 AM Scope Out: 9:15:07 AM Scope Withdrawal Time: 0 hours 12 minutes 53 seconds  Total Procedure Duration: 0 hours 53 minutes 57 seconds  Findings:                 The perianal and digital rectal examinations were                            normal.                           The colon (entire examined portion) was                            significantly redundant.                           A 1 mm polyp was found in the rectum. The polyp was  sessile. The polyp was removed with a cold biopsy                            forceps. Resection and retrieval were complete.                           Non-bleeding internal hemorrhoids were found during                            retroflexion. The hemorrhoids were medium-sized.                           A few small-mouthed diverticula were found in the                            sigmoid colon and ascending colon. Complications:            No immediate complications. Estimated Blood Loss:     Estimated blood loss was minimal. Impression:               - Redundant colon.                           - One 1 mm polyp in the rectum, removed with a cold                            biopsy forceps. Resected and retrieved.                           - Non-bleeding internal hemorrhoids.                           - Diverticulosis in the sigmoid colon and in the                             ascending colon. Recommendation:           - Patient has a contact number available for                            emergencies. The signs and symptoms of potential                            delayed complications were discussed with the                            patient. Return to normal activities tomorrow.                            Written discharge instructions were provided to the                            patient.                           - Resume previous diet.                           -  Continue present medications.                           - Await pathology results.                           - Repeat colonoscopy in 5-10 years for surveillance                            based on pathology results. Mauri Pole, MD 06/15/2017 9:28:01 AM This report has been signed electronically.

## 2017-06-16 ENCOUNTER — Telehealth: Payer: Self-pay

## 2017-06-16 NOTE — Telephone Encounter (Signed)
  Follow up Call-  Call back number 06/15/2017  Post procedure Call Back phone  # [phone]  Permission to leave phone message Yes  Some recent data might be hidden     Patient questions:  Do you have a fever, pain , or abdominal swelling? No. Pain Score  0 *  Have you tolerated food without any problems? Yes.    Have you been able to return to your normal activities? Yes.    Do you have any questions about your discharge instructions: Diet   No. Medications  No. Follow up visit  No.  Do you have questions or concerns about your Care? No.  Actions: * If pain score is 4 or above: No action needed, pain <4.

## 2017-06-26 ENCOUNTER — Encounter: Payer: Self-pay | Admitting: Gastroenterology

## 2017-07-11 ENCOUNTER — Telehealth: Payer: Self-pay | Admitting: Gastroenterology

## 2017-07-11 NOTE — Telephone Encounter (Signed)
The patient is interested in having the hemorrhoidal banding procedure. She denies any present problems, specifically not bleeding or pain. Is it okay to schedule her for banding?

## 2017-07-11 NOTE — Telephone Encounter (Signed)
Spoke with the patient. She will come in for her first banding on 08/16/17 arriving at [facility].

## 2017-07-11 NOTE — Telephone Encounter (Signed)
Ok to schedule hemorrhoidal banding. Thanks

## 2017-08-16 ENCOUNTER — Ambulatory Visit (INDEPENDENT_AMBULATORY_CARE_PROVIDER_SITE_OTHER): Payer: Medicare Other | Admitting: Gastroenterology

## 2017-08-16 ENCOUNTER — Encounter: Payer: Self-pay | Admitting: Gastroenterology

## 2017-08-16 VITALS — BP 134/82 | HR 72 | Ht 67.0 in | Wt 184.2 lb

## 2017-08-16 DIAGNOSIS — K641 Second degree hemorrhoids: Secondary | ICD-10-CM

## 2017-08-16 NOTE — Progress Notes (Signed)
PROCEDURE NOTE: The patient presents with symptomatic grade II  hemorrhoids, requesting rubber band ligation of his/her hemorrhoidal disease.  All risks, benefits and alternative forms of therapy were described and informed consent was obtained.  In the Left Lateral Decubitus position anoscopic examination revealed grade II hemorrhoids in the Right posterior, left lateral and right anterior position(s).  The anorectum was pre-medicated with 0.125% nitroglycerine and Recticare The decision was made to band the Right posterior internal hemorrhoid, and the [place] was used to perform band ligation without complication.  Digital anorectal examination was then performed to assure proper positioning of the band, and to adjust the banded tissue as required.  The patient was discharged home without pain or other issues.  Dietary and behavioral recommendations were given and along with follow-up instructions.      The patient will return in 2-4 weeks for  follow-up and possible additional banding as required. No complications were encountered and the patient tolerated the procedure well.  Damaris Hippo , MD [phone] Mon-Fri 8a-5p [phone] after 5p, weekends, holidays

## 2017-08-16 NOTE — Patient Instructions (Signed)

## 2017-08-31 ENCOUNTER — Ambulatory Visit: Payer: Medicare Other

## 2017-09-01 ENCOUNTER — Telehealth: Payer: Self-pay | Admitting: Gastroenterology

## 2017-09-01 NOTE — Telephone Encounter (Signed)
Patient asking questions about continued constipation. She is presently taking Miralax once daily. She has "good"  bowel movements some days. But repeatedly she feels constipated. She is following the instruction from Dr Silverio Decamp. She states she has "good" water intake. Discussed triturating her Miralax to 2 times daily PRN to obtain satisfactory bowel movements.  She still will have occasional blood on the toilet paper when cleaning from bowel movement. Discussed the banding and why it is generally done more than once. She is aware she has 2 more to be banded. Discussed the spacing of the procedures. Patient states she feels more confident now and will call back PRN other concerns.

## 2017-09-01 NOTE — Telephone Encounter (Signed)
Patient states she has some concerns with her hemorrhoids and would like a call to discuss her symptoms before her next banding in April.

## 2017-09-04 ENCOUNTER — Ambulatory Visit: Payer: Medicare Other

## 2017-09-29 ENCOUNTER — Ambulatory Visit (INDEPENDENT_AMBULATORY_CARE_PROVIDER_SITE_OTHER): Payer: Medicare Other | Admitting: Gastroenterology

## 2017-09-29 ENCOUNTER — Encounter: Payer: Self-pay | Admitting: Gastroenterology

## 2017-09-29 VITALS — BP 126/78 | HR 70 | Ht 67.0 in | Wt 182.2 lb

## 2017-09-29 DIAGNOSIS — K641 Second degree hemorrhoids: Secondary | ICD-10-CM

## 2017-09-29 NOTE — Progress Notes (Signed)
PROCEDURE NOTE: The patient presents with symptomatic grade II  hemorrhoids, requesting rubber band ligation of his/her hemorrhoidal disease.  All risks, benefits and alternative forms of therapy were described and informed consent was obtained.   The anorectum was pre-medicated with 0.125% nitroglycerine and recticare The decision was made to band the left lateral internal hemorrhoid, and the [place] was used to perform band ligation without complication.  Digital anorectal examination was then performed to assure proper positioning of the band, and to adjust the banded tissue as required.  The patient was discharged home without pain or other issues.  Dietary and behavioral recommendations were given and along with follow-up instructions.      The patient will return in 4-6 weeks for  follow-up and possible additional banding as required. No complications were encountered and the patient tolerated the procedure well.  Damaris Hippo , MD [phone]

## 2017-09-29 NOTE — Patient Instructions (Signed)

## 2017-10-18 ENCOUNTER — Ambulatory Visit: Payer: Medicare Other | Admitting: Family Medicine

## 2017-10-19 ENCOUNTER — Telehealth: Payer: Self-pay | Admitting: Gastroenterology

## 2017-10-19 NOTE — Telephone Encounter (Signed)
Patient is 3 weeks post banding. She is calling asking if she should be seeing any blood after a bowel movement. She reports she has blood after some bowel movements on the toilet paper. She has a "soreness" inside on the right but not pain. Denies pain or constipation.

## 2017-10-19 NOTE — Telephone Encounter (Signed)
Called patient.  Small amount brbpr when she wipes and also has some soreness. So far she had 2 hemorrhoidal banding.  Could be from the untreated hemorrhoids. Patient also reports having hard stool and having to strain. Advised her to increase dietary fluid and fiber intake. Preparation H suppository at bedtime for next 3-5 days. She is scheduled for 3rd banding 1st week of June, please check if we can bring her in sooner if I have any cancellations. Thanks

## 2017-10-21 ENCOUNTER — Other Ambulatory Visit: Payer: Self-pay | Admitting: Family Medicine

## 2017-10-25 ENCOUNTER — Other Ambulatory Visit (HOSPITAL_COMMUNITY): Payer: Medicare Other

## 2017-10-25 ENCOUNTER — Ambulatory Visit (HOSPITAL_COMMUNITY): Payer: Medicare Other

## 2017-10-28 IMAGING — DX DG BONE SURVEY MET
10 series · 10 of 10 positions shown · non-contrast
Comparison: Right shoulder radiographs 08/21/2015. Chest
radiographs 05/19/2014. Cervical spine radiographs 12/08/2009.

CLINICAL DATA: Lytic bone lesions on x-ray. Possible myeloma. Right
shoulder pain.

EXAM:
METASTATIC BONE SURVEY

[skull lat]
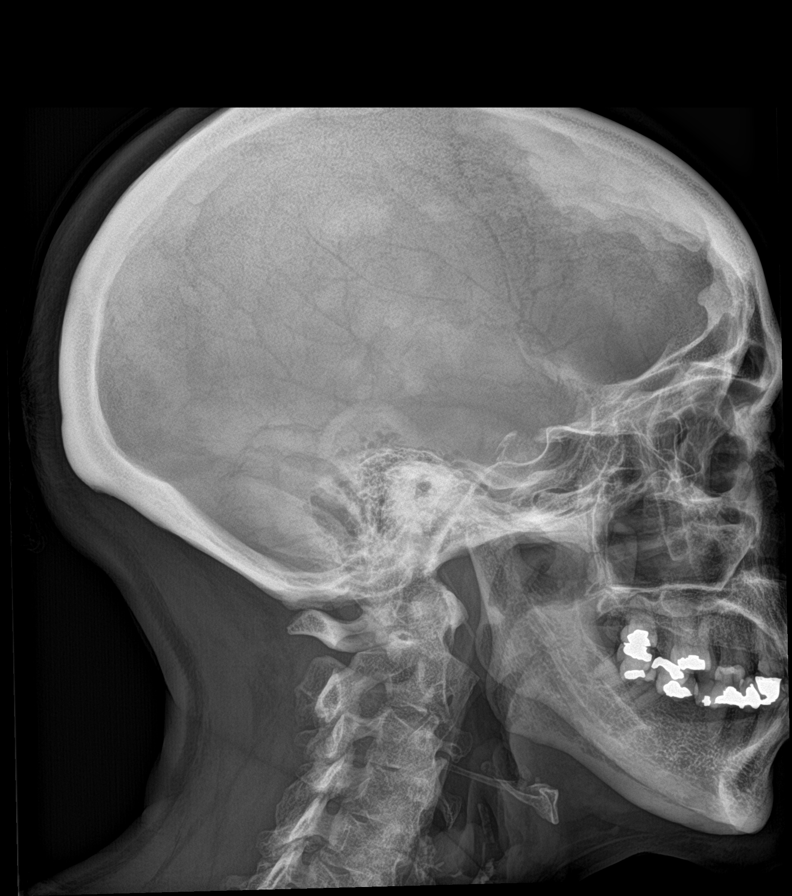

[shoulder ap (1 of 2)]
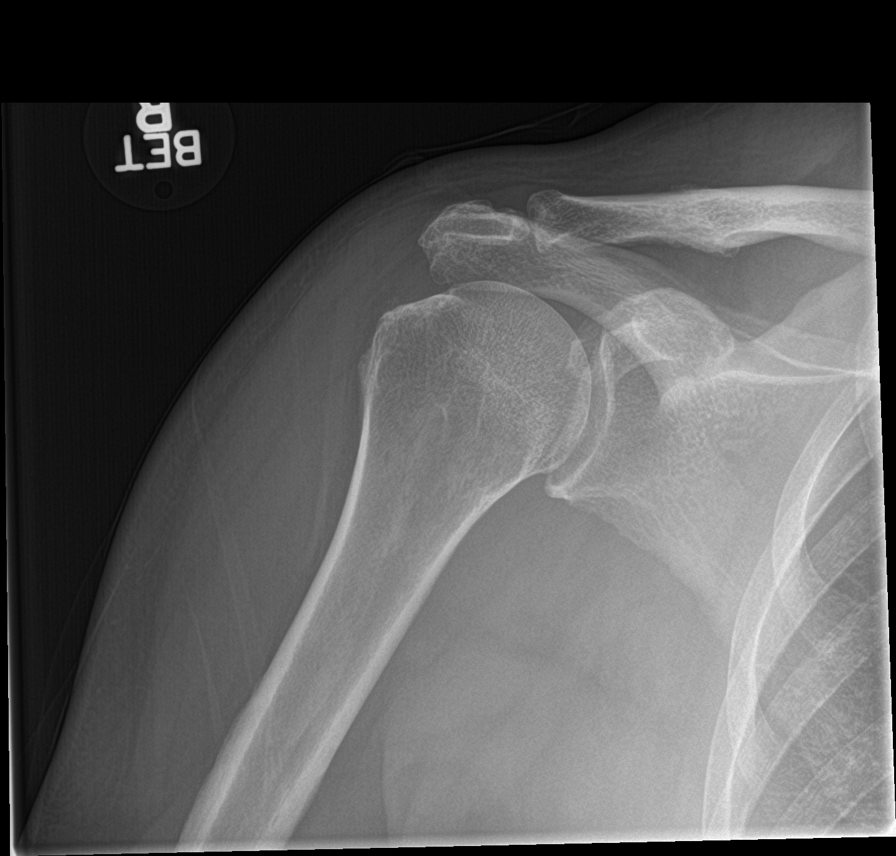

[shoulder ap (2 of 2)]
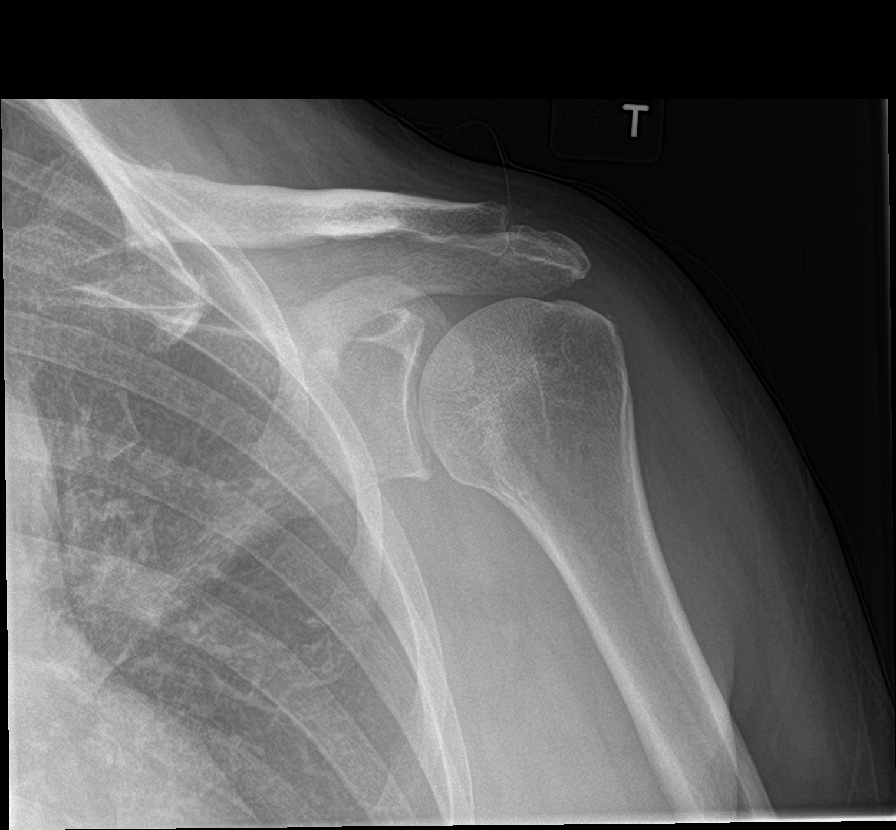

[humerus ap (1 of 2)]
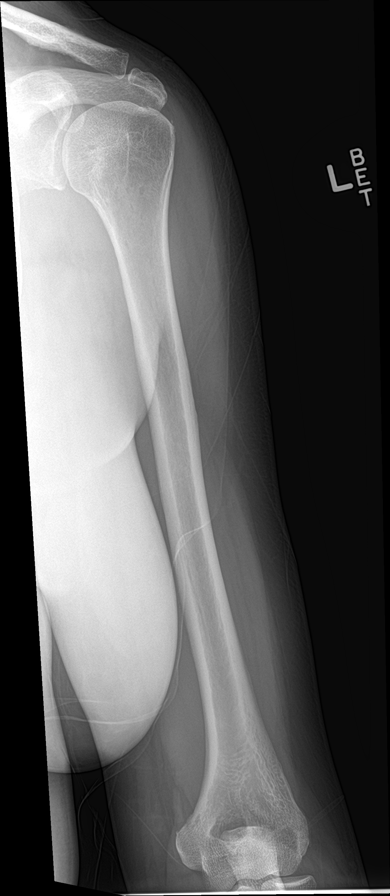

[humerus ap (2 of 2)]
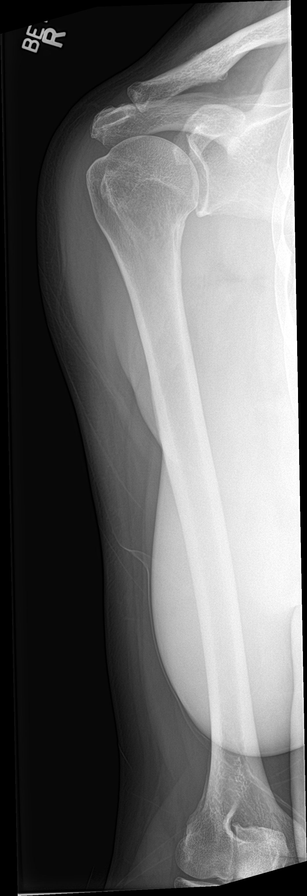

[forearm ap (1 of 2)]
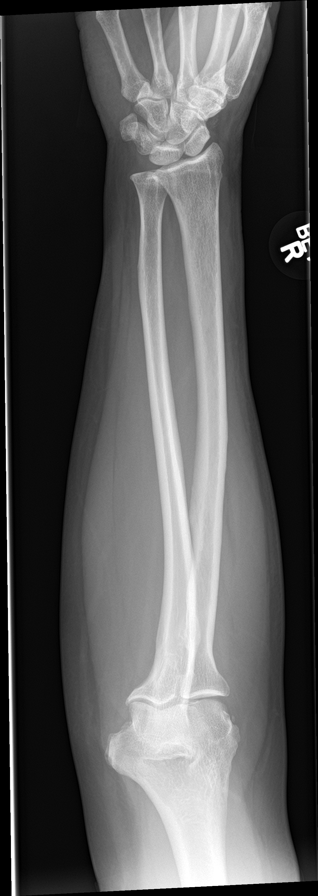

[forearm ap (2 of 2)]
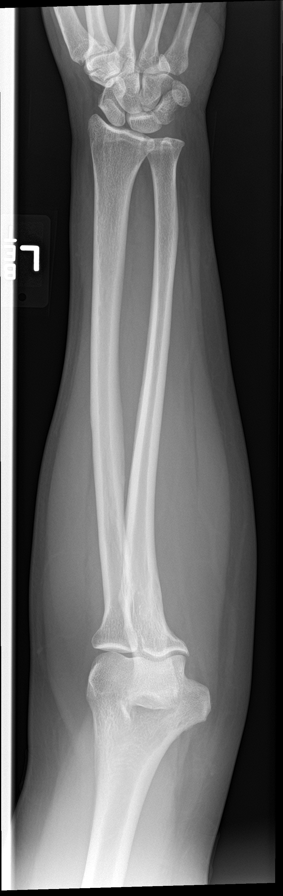

[c-spine ap]
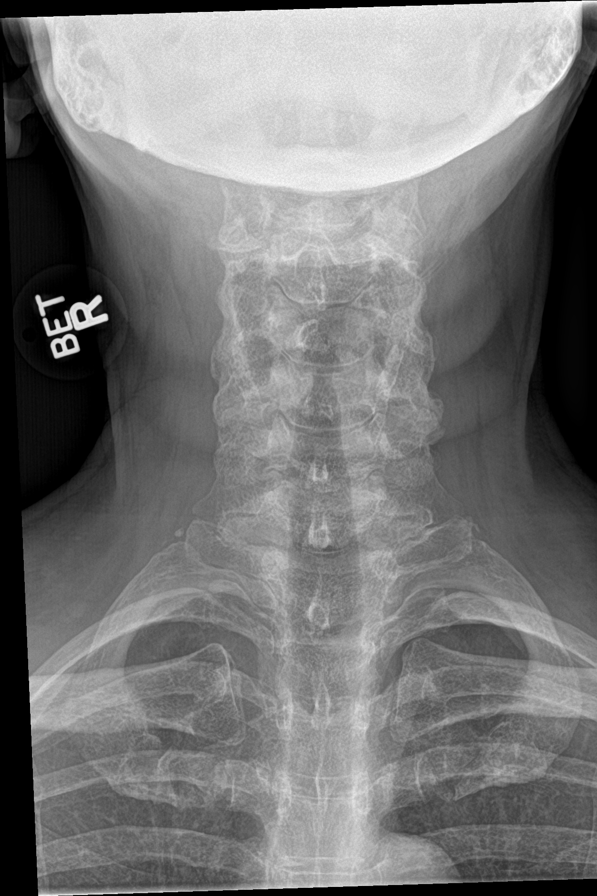

[c-spine lat]
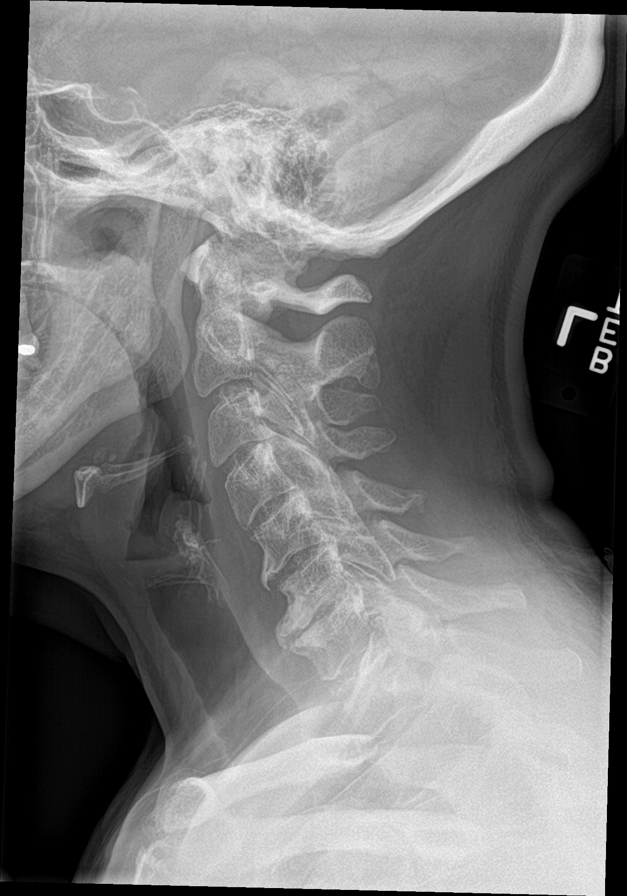

[t-spine ap]
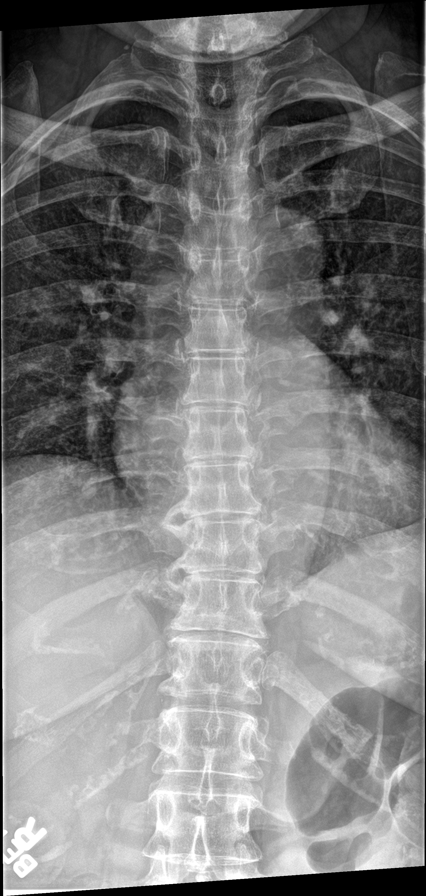

[10 of 10 positions shown; findings below may reference images not displayed]

FINDINGS: Tiny lucencies in the proximal right humerus and scapula on the
prior shoulder radiographs are not as well demonstrated on the
current examination. There is at most one similar lucency in the
left humeral neck measuring 3 mm. No suspicious lytic or blastic
osseous lesions are identified elsewhere in the imaged portion of
the skeleton.

Hyperostosis frontalis is noted. Multilevel cervical disc
degeneration is greatest at C6-7 where there is moderate disc space
narrowing and anterior greater than posterior osteophytosis, similar
to the prior study. Mild thoracic and lumbar spondylosis is noted,
and there are degenerative changes involving the SI joints. Chronic
appearing sclerosis is noted about the SI joints and pubic
symphysis. Transitional lumbosacral anatomy is noted with partial
lumbarization of S1, and there is grade 1 anterolisthesis of L4 on
L5 which is likely likely degenerative and facet mediated.

The lungs demonstrate mild chronic interstitial coarsening without
evidence of acute airspace consolidation, edema, pleural effusion,
or pneumothorax. Cardiac silhouette is normal in size. Thoracic
aortic calcification is noted.
IMPRESSION: No definite evidence of multiple myeloma or metastatic disease.
Tiny, subtle lucencies in the right and possibly left proximal
humeri and right scapula are not as well seen as on the prior
shoulder radiographs.

## 2017-11-16 ENCOUNTER — Ambulatory Visit (INDEPENDENT_AMBULATORY_CARE_PROVIDER_SITE_OTHER): Payer: Medicare Other | Admitting: Family Medicine

## 2017-11-16 ENCOUNTER — Encounter: Payer: Self-pay | Admitting: Family Medicine

## 2017-11-16 VITALS — BP 124/76 | HR 67 | Ht 68.0 in | Wt 181.0 lb

## 2017-11-16 DIAGNOSIS — R7301 Impaired fasting glucose: Secondary | ICD-10-CM | POA: Diagnosis not present

## 2017-11-16 DIAGNOSIS — F5105 Insomnia due to other mental disorder: Secondary | ICD-10-CM

## 2017-11-16 DIAGNOSIS — E559 Vitamin D deficiency, unspecified: Secondary | ICD-10-CM | POA: Diagnosis not present

## 2017-11-16 DIAGNOSIS — I10 Essential (primary) hypertension: Secondary | ICD-10-CM

## 2017-11-16 DIAGNOSIS — F419 Anxiety disorder, unspecified: Secondary | ICD-10-CM | POA: Diagnosis not present

## 2017-11-16 DIAGNOSIS — F418 Other specified anxiety disorders: Secondary | ICD-10-CM | POA: Diagnosis not present

## 2017-11-16 DIAGNOSIS — Z13220 Encounter for screening for lipoid disorders: Secondary | ICD-10-CM

## 2017-11-16 MED ORDER — SERTRALINE HCL 100 MG PO TABS: 100.0000 mg | ORAL_TABLET | Freq: Every day | ORAL | 6 refills | Status: DC

## 2017-11-16 NOTE — Patient Instructions (Signed)
F/U in 8 weeks, call if you need me before  Increase in dose of zoloft to 100 mg daily, you may take TWO 50 mg tablets once daily till done, then the new script is ONE 100 mg tablet once daily  Thankful you are getting help for caregivers and therapy   blood pressure is excellent  La s today lipid, cmp and EGFR, magnesium, TSH and vitD  /Thank you  for choosing [facility]. We consider it a privelige to serve you.  Delivering excellent health care in a caring and  compassionate way is our goal.  Partnering with you,  so that together we can achieve this goal is our strategy.

## 2017-11-17 ENCOUNTER — Encounter: Payer: Self-pay | Admitting: Family Medicine

## 2017-11-17 ENCOUNTER — Telehealth (HOSPITAL_COMMUNITY): Payer: Self-pay

## 2017-11-17 LAB — COMPLETE METABOLIC PANEL WITH GFR
AG Ratio: 1.5 (calc) (ref 1.0–2.5)
ALT: 15 U/L (ref 6–29)
AST: 16 U/L (ref 10–35)
Albumin: 4.3 g/dL (ref 3.6–5.1)
Alkaline phosphatase (APISO): 50 U/L (ref 33–130)
BUN: 8 mg/dL (ref 7–25)
CO2: 30 mmol/L (ref 20–32)
Calcium: 9.7 mg/dL (ref 8.6–10.4)
Chloride: 102 mmol/L (ref 98–110)
Creat: 0.87 mg/dL (ref 0.50–0.99)
GFR, Est African American: 79 mL/min/{1.73_m2} (ref >=60)
GFR, Est Non African American: 68 mL/min/{1.73_m2} (ref >=60)
Globulin: 2.9 g/dL (calc) (ref 1.9–3.7)
Glucose, Bld: 100 mg/dL (ref 65–139)
Potassium: 3.6 mmol/L (ref 3.5–5.3)
Sodium: 140 mmol/L (ref 135–146)
Total Bilirubin: 0.6 mg/dL (ref 0.2–1.2)
Total Protein: 7.2 g/dL (ref 6.1–8.1)

## 2017-11-17 LAB — LIPID PANEL
Cholesterol: 174 mg/dL (ref <200)
HDL: 60 mg/dL (ref >50)
LDL Cholesterol (Calc): 98 mg/dL (calc)
Non-HDL Cholesterol (Calc): 114 mg/dL (calc) (ref <130)
Total CHOL/HDL Ratio: 2.9 (calc) (ref <5.0)
Triglycerides: 71 mg/dL (ref <150)

## 2017-11-17 LAB — TSH: TSH: 2.02 mIU/L (ref 0.40–4.50)

## 2017-11-17 LAB — MAGNESIUM: Magnesium: 2.3 mg/dL (ref 1.5–2.5)

## 2017-11-17 LAB — VITAMIN D 25 HYDROXY (VIT D DEFICIENCY, FRACTURES): Vit D, 25-Hydroxy: 23 ng/mL — ABNORMAL LOW (ref 30–100)

## 2017-11-17 NOTE — Progress Notes (Signed)
   Tiffany Mendoza     MRN: [id number]      DOB: 1951/06/08   HPI Tiffany Mendoza is here for follow up and re-evaluation of chronic medical conditions, medication management and review of any available recent lab and radiology data.  Preventive health is reviewed and is up to date. She has been getting legal help getting the affairs of her mother sorted out and this has beneficial though she has been going through quite some struggle both financially and mentally and emotionally. 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 She is able to identify her symptoms of depression and anxiety, is on zoloft 50 mg and can recall being on a higher dose and recognizes that she may need this although already speaking with a therapist , she is interested in additional therapy and will certainly benefit. Attempting to physically separate herself from her Mother more often and hoping to live on her own  ROS Denies recent fever or chills. Denies sinus pressure, nasal congestion, ear pain or sore throat. Denies chest congestion, productive cough or wheezing. Denies chest pains, palpitations and leg swelling Denies abdominal pain, nausea, vomiting,diarrhea or constipation.   Denies dysuria, frequency, hesitancy or incontinence. Denies joint pain, swelling and limitation in mobility. Denies headaches, seizures, numbness, or tingling. .  Denies skin break down or rash.   PE  BP 124/76 (BP Location: Left Arm, Patient Position: Sitting, Cuff Size: Large)   Pulse 67   Ht 5\' 8"  (1.727 m)   Wt 181 lb (82.1 kg)   SpO2 95%   BMI 27.52 kg/m   Patient alert and oriented and in no cardiopulmonary distress.  HEENT: No facial asymmetry, EOMI,   oropharynx pink and moist.  Neck supple no JVD, no mass.  Chest: Clear to auscultation bilaterally.  CVS: S1, S2 no murmurs, no S3.Regular  rate.  ABD: Soft non tender.   Ext: No edema  MS: Adequate ROM spine, shoulders, hips and knees.  Skin: Intact, no ulcerations or rash noted.  Psych: Good eye contact, normal affect. Memory intact not anxious or depressed appearing.  CNS: CN 2-12 intact, power,  normal throughout.no focal deficits noted.   Assessment & Plan  Depression with anxiety Increase dose of zoloft to 100 mg daily and refer to tele psychiatry. Depression is multifactorial, manly driven by life circumstances involving caring for Mother with dementia, and poor relationships with sibling(s) and difficult financial situation  Essential hypertension Controlled, no change in medication DASH diet and commitment to daily physical activity for a minimum of 30 minutes discussed and encouraged, as a part of hypertension management. The importance of attaining a healthy weight is also discussed.  BP/Weight 11/16/2017 09/29/2017 08/16/2017 06/15/2017 05/24/2017 05/02/2017 01/0/9323  Systolic BP 557 322 025 427 062 376 283  Diastolic BP 76 78 82 84 78 62 81  Wt. (Lbs) 181 182.25 184.25 179 179.4 182 180.6  BMI 27.52 28.54 28.86 28.04 28.1 28.51 28.29       Insomnia secondary to anxiety Sleep hygiene reviewed Prescription sent for  medication needed.

## 2017-11-17 NOTE — Assessment & Plan Note (Signed)
Controlled, no change in medication DASH diet and commitment to daily physical activity for a minimum of 30 minutes discussed and encouraged, as a part of hypertension management. The importance of attaining a healthy weight is also discussed.  BP/Weight 11/16/2017 09/29/2017 08/16/2017 06/15/2017 05/24/2017 05/02/2017 94/02/4472  Systolic BP 958 441 712 787 183 672 550  Diastolic BP 76 78 82 84 78 62 81  Wt. (Lbs) 181 182.25 184.25 179 179.4 182 180.6  BMI 27.52 28.54 28.86 28.04 28.1 28.51 28.29

## 2017-11-17 NOTE — Assessment & Plan Note (Signed)
Increase dose of zoloft to 100 mg daily and refer to tele psychiatry. Depression is multifactorial, manly driven by life circumstances involving caring for Mother with dementia, and poor relationships with sibling(s) and difficult financial situation

## 2017-11-17 NOTE — Telephone Encounter (Signed)
Patient reports that she is not able to speak and requested that I contact her on Monday 11-20-2017 after 12pm

## 2017-11-17 NOTE — Assessment & Plan Note (Addendum)
Sleep hygiene reviewed  Prescription sent for  medication needed.  

## 2017-11-20 ENCOUNTER — Encounter

## 2017-11-21 ENCOUNTER — Ambulatory Visit (INDEPENDENT_AMBULATORY_CARE_PROVIDER_SITE_OTHER): Payer: Medicare Other | Admitting: Gastroenterology

## 2017-11-21 ENCOUNTER — Encounter: Payer: Self-pay | Admitting: Gastroenterology

## 2017-11-21 VITALS — BP 120/74 | HR 76 | Ht 67.0 in | Wt 180.0 lb

## 2017-11-21 DIAGNOSIS — K641 Second degree hemorrhoids: Secondary | ICD-10-CM | POA: Diagnosis not present

## 2017-11-21 NOTE — Progress Notes (Signed)
PROCEDURE NOTE: The patient presents with symptomatic grade II  hemorrhoids, requesting rubber band ligation of his/her hemorrhoidal disease.  All risks, benefits and alternative forms of therapy were described and informed consent was obtained.   The anorectum was pre-medicated with 0.125% Nitroglycerine and Recticare The decision was made to band the Right anterior internal hemorrhoid, and the [place] was used to perform band ligation without complication.  Digital anorectal examination was then performed to assure proper positioning of the band, and to adjust the banded tissue as required.  The patient was discharged home without pain or other issues.  Dietary and behavioral recommendations were given and along with follow-up instructions.    The patient will return for  follow-up and possible additional banding as required. No complications were encountered and the patient tolerated the procedure well.  Damaris Hippo , MD [phone]

## 2017-11-21 NOTE — Patient Instructions (Signed)

## 2017-12-04 ENCOUNTER — Telehealth: Payer: Self-pay | Admitting: Gastroenterology

## 2017-12-04 NOTE — Telephone Encounter (Signed)
Patient states Dr.Nandigam did the hemorrhoid bandings on her and she is still having some bleeding. Pt requesting to speak with the nurse.

## 2017-12-04 NOTE — Telephone Encounter (Signed)
Spoke with pt and she had banding done 11/21/17. States she had a BM today and noticed some BRB per rectum. Discussed with pt that this is not unusual and to monitor it and let us know if this continues. Pt verbalized understanding.

## 2017-12-11 ENCOUNTER — Telehealth: Payer: Self-pay

## 2017-12-11 DIAGNOSIS — F418 Other specified anxiety disorders: Secondary | ICD-10-CM

## 2017-12-11 NOTE — BH Specialist Note (Signed)
[facility] Initial Clinical Assessment  MRN: [id number] NAME: SAMIYAH STUPKA Date: 12/11/17   Total time: 30 minutes  Type of Contact: Type of Contact: Phone Call Initial Contact Patient consent obtained: Patient consent obtained for Virtual Visit: (N/A) Reason for Visit today: Reason for Your Call/Visit Today: Caren Griffins   Treatment History Patient recently received Inpatient Treatment: Have You Recently Been in Any Inpatient Treatment (Hospital/Detox/Crisis Center/28-Day Program)?: No  Facility/Program:  No  Date of discharge:  No Patient currently being seen by therapist/psychiatrist: Do You Currently Have a Therapist/Psychiatrist?: No   Patient currently receiving the following services: Patient Currently Receiving the Following Services:: Medication Management(    PCP prescribes medication)  Past Psychiatric History/Hospitalization(s): Anxiety: Yes Bipolar Disorder: No Depression: Yes Mania: No Psychosis: No Schizophrenia: No Personality Disorder: No Hospitalization for psychiatric illness: No History of Electroconvulsive Shock Therapy: No Prior Suicide Attempts: No  Decreased need for sleep: No  Euphoria: No Self Injurious behaviors No Family History of mental illness: No Family History of substance abuse: No  Substance Abuse: No  DUI: No  Insomnia: No  History of violence No  Physical, sexual or emotional abuse:No  Prior outpatient mental health therapy: No      Clinical Assessment:  Patient is a 67 year old female that reports that her psychiatric medication is not working.  Patient reports taking medication since she was in her 85's.  Patient denies any issues with sleep or appetite.  Patient reports depression and anxiety associated with caring for her mother that has dementia and is combative and argumentative.  Her mother is 38 years old.   Patient reports that she now has a strained relationship with her sisters due to her sister's lack of  help with their mother.   Patient wants to work part time, obtain her own apartment.  Patient lives in the home with her mother.  Patient was divoced in 2009      PHQ-9 Assessments: Depression screen [facility] 2/9 12/11/2017 11/16/2017 11/16/2017  Decreased Interest 3 3 3   Down, Depressed, Hopeless 3 3 2   PHQ - 2 Score 6 6 5   Altered sleeping 2 3 2   Tired, decreased energy 2 3 2   Change in appetite 1 3 0  Feeling bad or failure about yourself  3 1 0  Trouble concentrating 1 2 0  Moving slowly or fidgety/restless 0 1 0  Suicidal thoughts 0 1 0  PHQ-9 Score 15 20 9   Difficult doing work/chores Somewhat difficult Somewhat difficult Somewhat difficult    GAD-7 Assessments: GAD 7 : Generalized Anxiety Score 12/11/2017  Nervous, Anxious, on Edge 2  Control/stop worrying 2  Worry too much - different things 2  Trouble relaxing 1  Restless 1  Easily annoyed or irritable 1  Afraid - awful might happen 3  Total GAD 7 Score 12  Anxiety Difficulty Somewhat difficult     Social Functioning Social maturity: Social Maturity: Responsible Social judgement: Social Judgement: Normal  Stress Current stressors: Current Stressors: (Total caretaker for her mother that has been diagnosed with Dementia) Familial stressors: Familial Stressors: None Sleep:  None Reported  Appetite: Appetite: Decreased Coping ability: Coping ability: Normal Patient taking medications as prescribed: Patient taking medications as prescribed: Yes  Current medications:  Outpatient Encounter Medications as of 12/11/2017  Medication Sig  . CALCIUM PO Take 1 Dose by mouth as needed (supplement).  . clorazepate (TRANXENE) 7.5 MG tablet TAKE 1 TABLET BY MOUTH TWICE DAILY AS NEEDED  . COD LIVER OIL PO  Take 1 Dose by mouth as needed (supplement).  . fish oil-omega-3 fatty acids 1000 MG capsule Take 1 g by mouth daily.  . hydrochlorothiazide (HYDRODIURIL) 25 MG tablet Take 1 tablet (25 mg total) by mouth daily.  . Nutritional  Supplements (NUTRITIONAL SUPPLEMENT PO) Take by mouth. Psyllium Husk  . Polyethylene Glycol 3350 (MIRALAX PO) Take by mouth daily as needed.   . Potassium 99 MG TABS Take by mouth daily.    . ranitidine (ZANTAC) 150 MG capsule Take 150 mg as needed by mouth.   . sertraline (ZOLOFT) 100 MG tablet Take 1 tablet (100 mg total) by mouth daily.  Marland Kitchen Specialty Vitamins Products (BIOTIN PLUS KERATIN PO) Take by mouth daily.   No facility-administered encounter medications on file as of 12/24/17.     Self-harm Behaviors Risk Assessment Self-harm risk factors: Self-harm risk factors: (None Reported) Patient endorses recent thoughts of harming self: Have you recently had any thoughts about harming yourself?: No  [place] Suicide Severity Rating Scale:  C-SRSS 2017-12-24  1. Wish to be Dead No  2. Suicidal Thoughts No  6. Suicide Behavior Question No    Danger to Others Risk Assessment Danger to others risk factors: Danger to Others Risk Factors: No risk factors noted Patient endorses recent thoughts of harming others: Notification required: No need or identified person    Substance Use Assessment Patient recently consumed alcohol: Have you recently consumed alcohol?: No  Alcohol Use Disorder Identification Test (AUDIT):  Alcohol Use Disorder Test (AUDIT) Dec 24, 2017  1. How often do you have a drink containing alcohol? 0  2. How many drinks containing alcohol do you have on a typical day when you are drinking? 0  3. How often do you have six or more drinks on one occasion? 0  AUDIT-C Score 0  Intervention/Follow-up AUDIT Score <7 follow-up not indicated   Patient recently used drugs: Have you recently used any drugs?: No Patient is concerned about dependence or abuse of substances: Does patient seem concerned about dependence or abuse of any substance?: No    Goals, Interventions and Follow-up Plan  Goals: Increase healthy adjustment to current life circumstances  Interventions:  Motivational Interviewing, Solution-Focused Strategies, Mindfulness or Psychologist, educational, Behavioral Activation, Brief CBT and Supportive Counseling  Follow-up Plan: Phone [facility] Initial Phone Assessment   Summary of Clinical Assessment  Summary:   Patient is a 68 year old female that reports that her psychiatric medication is not working.  Patient reports taking medication since she was in her 71's.  Patient reports depression and anxiety associated with caring for her mother that has dementia and is combative and argumentative.  Her mother is 86 years old.   Patient reports that she now has a strained relationship with her sisters due to her sister's lack of help with their mother.   Patient wants to work part time, obtain her own apartment.    Graciella Freer [place], LCAS-A

## 2017-12-12 ENCOUNTER — Encounter (HOSPITAL_COMMUNITY): Payer: Self-pay | Admitting: Psychiatry

## 2017-12-12 NOTE — Telephone Encounter (Deleted)
[facility] ([place]) Psychiatric Consultant Case Review   Summary Tiffany Mendoza is a 67 y.o. year old female with history of depression, hypertension. Sertraline was uptitrated by PCP.   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 CALCIUM PO Take 1 Dose by mouth as needed (supplement).    . clorazepate (TRANXENE) 7.5 MG tablet TAKE 1 TABLET BY MOUTH TWICE DAILY AS NEEDED 60 tablet 1  . COD LIVER OIL PO Take 1 Dose by mouth as needed (supplement).    . fish oil-omega-3 fatty acids 1000 MG capsule Take 1 g by mouth daily.    . hydrochlorothiazide (HYDRODIURIL) 25 MG tablet Take 1 tablet (25 mg total) by mouth daily. 90 tablet 1  . Nutritional Supplements (NUTRITIONAL SUPPLEMENT PO) Take by mouth. Psyllium Husk    . Polyethylene Glycol 3350 (MIRALAX PO) Take by mouth daily as needed.     . Potassium 99 MG TABS Take by mouth daily.      . ranitidine (ZANTAC) 150 MG capsule Take 150 mg as needed by mouth.     . sertraline (ZOLOFT) 100 MG tablet Take 1 tablet (100 mg total) by mouth daily. 30 tablet 6  . Specialty Vitamins Products (BIOTIN PLUS KERATIN PO) Take by mouth daily.     No current facility-administered medications on file prior to visit.      Past psychiatry history Outpatient:  Psychiatry admission: denies Previous suicide attempt: denies Past trials of medication: sertraline History of violence:   Current measures Depression screen [facility] 2/9 12/11/2017 11/16/2017 11/16/2017 08/31/2016 06/01/2016  Decreased Interest 3 3 3  0 0  Down, Depressed, Hopeless 3 3 2  0 0  PHQ - 2 Score 6 6 5  0 0  Altered sleeping 2 3 2  - -  Tired, decreased energy 2 3 2  - -  Change in appetite 1 3 0 - -  Feeling bad or failure about yourself  3 1 0 - -  Trouble concentrating 1 2 0 - -  Moving slowly or fidgety/restless 0 1  0 - -  Suicidal thoughts 0 1 0 - -  PHQ-9 Score 15 20 9  - -  Difficult doing work/chores Somewhat difficult Somewhat difficult Somewhat difficult - -   GAD 7 : Generalized Anxiety Score 12/11/2017  Nervous, Anxious, on Edge 2  Control/stop worrying 2  Worry too much - different things 2  Trouble relaxing 1  Restless 1  Easily annoyed or irritable 1  Afraid - awful might happen 3  Total GAD 7 Score 12  Anxiety Difficulty Somewhat difficult    Goals (patient centered) Increase healthy adjustment to current life circumstances   Assessment/Provisional Diagnosis # MDD Would recommend to continue current dose of sertraline given it is recently uptitrated. Would recommend future uptitration as indicated  Recommendation - Continue sertraline 100 mg daily. Consider future uptitration as indicated.  - [place] specialist to coach self compassion - Inform patient of [facility] if applicable for her mother's care [address] [place],  [address] Office: [phone]    Thank you for your consult. We will continue to follow the patient. Please contact [place]  for any questions or concerns.   The above treatment considerations and suggestions are based on consultation with the [facility] specialist and/or PCP and a review of information available in the shared registry and the  patient's Electronic Health Record (EHR). I have not personally examined the patient. All recommendations should be implemented with consideration of the patient's relevant prior history and current clinical status. Please feel free to call me with any questions about the care of this patient.  This encounter was created in error - please disregard.

## 2017-12-13 NOTE — Progress Notes (Signed)
[facility] ([facility]) Psychiatric Consultant Case Review   Summary Tiffany Mendoza is a 67 y.o. year old female with history of depression, hypertension. Sertraline was uptitrated by PCP.   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 CALCIUM PO Take 1 Dose by mouth as needed (supplement).    . clorazepate (TRANXENE) 7.5 MG tablet TAKE 1 TABLET BY MOUTH TWICE DAILY AS NEEDED 60 tablet 1  . COD LIVER OIL PO Take 1 Dose by mouth as needed (supplement).    . fish oil-omega-3 fatty acids 1000 MG capsule Take 1 g by mouth daily.    . hydrochlorothiazide (HYDRODIURIL) 25 MG tablet Take 1 tablet (25 mg total) by mouth daily. 90 tablet 1  . Nutritional Supplements (NUTRITIONAL SUPPLEMENT PO) Take by mouth. Psyllium Husk    . Polyethylene Glycol 3350 (MIRALAX PO) Take by mouth daily as needed.     . Potassium 99 MG TABS Take by mouth daily.      . ranitidine (ZANTAC) 150 MG capsule Take 150 mg as needed by mouth.     . sertraline (ZOLOFT) 100 MG tablet Take 1 tablet (100 mg total) by mouth daily. 30 tablet 6  . Specialty Vitamins Products (BIOTIN PLUS KERATIN PO) Take by mouth daily.     No current facility-administered medications on file prior to visit.      Past psychiatry history Outpatient:  Psychiatry admission: denies Previous suicide attempt: denies Past trials of medication: sertraline History of violence:   Current measures Depression screen [facility] 2/9 12/11/2017 11/16/2017 11/16/2017 08/31/2016 06/01/2016  Decreased Interest 3 3 3  0 0  Down, Depressed, Hopeless 3 3 2  0 0  PHQ - 2 Score 6 6 5  0 0  Altered sleeping 2 3 2  - -  Tired, decreased energy 2 3 2  - -  Change in appetite 1 3 0 - -  Feeling bad or failure about yourself  3 1 0 - -  Trouble concentrating 1 2 0 - -  Moving slowly or fidgety/restless 0 1  0 - -  Suicidal thoughts 0 1 0 - -  PHQ-9 Score 15 20 9  - -  Difficult doing work/chores Somewhat difficult Somewhat difficult Somewhat difficult - -   GAD 7 : Generalized Anxiety Score 12/11/2017  Nervous, Anxious, on Edge 2  Control/stop worrying 2  Worry too much - different things 2  Trouble relaxing 1  Restless 1  Easily annoyed or irritable 1  Afraid - awful might happen 3  Total GAD 7 Score 12  Anxiety Difficulty Somewhat difficult    Goals (patient centered) Increase healthy adjustment to current life circumstances   Assessment/Provisional Diagnosis # MDD Would recommend to continue current dose of sertraline given it is recently uptitrated. Would recommend future uptitration as indicated  Recommendation - Continue sertraline 100 mg daily. Consider future uptitration as indicated.  - [place] specialist to coach self compassion - Inform patient of [facility] if applicable for her mother's care [address] [place], [place] [address] Office: [phone]   Thank you for your consult. We will continue to follow the patient. Please contact [place]  for any questions or concerns.   The above treatment considerations and suggestions are based on consultation with the [facility] specialist and/or PCP and a review of information available in the shared registry and the patient's  Electronic Health Record (EHR). I have not personally examined the patient. All recommendations should be implemented with consideration of the patient's relevant prior history and current clinical status. Please feel free to call me with any questions about the care of this patient.

## 2017-12-13 NOTE — Telephone Encounter (Signed)
This encounter was created in error - please disregard.

## 2017-12-25 ENCOUNTER — Telehealth: Payer: Self-pay | Admitting: Gastroenterology

## 2017-12-26 NOTE — Telephone Encounter (Signed)
Spoke with the patient. She is having blood pass with her bowel movement. She states no bleeding any other time. No bleeding after the bowel movement. Denies any pain with the bowel movement. Appointment made for evaluate. She has had 3 bandings already. She states history of fissures. But she again denies any pain.

## 2017-12-26 NOTE — Telephone Encounter (Signed)
Patient is instructed and agrees with this plan.

## 2017-12-26 NOTE — Telephone Encounter (Signed)
Please advise patient to avoid straining with Bowel movements. Benefiber 1 teaspoon TID with meals. Preparation H suppository at bedtime X 5 days and please try to bring her in sooner. Thanks

## 2017-12-30 ENCOUNTER — Telehealth: Payer: Self-pay | Admitting: Gastroenterology

## 2017-12-30 NOTE — Telephone Encounter (Signed)
She is having intermittent BRBPR when she wipes after a BM, she feels IBS is also worse has abdominal cramps and multiple bowel movements, under significant stress due to her mother's condition with worsening dementia. Some soreness and discomfort in the rectum Concerning for anal fissure vs bleeding from internal hemorrhaoids  Advised her to continue benefiber and miralax as needed Please bring her on July 16th at 2:15 PM for evaluation.

## 2018-01-01 NOTE — Telephone Encounter (Signed)
Patient contacted. She agrees to this appointment. 

## 2018-01-02 ENCOUNTER — Encounter: Payer: Self-pay | Admitting: Gastroenterology

## 2018-01-02 ENCOUNTER — Ambulatory Visit (INDEPENDENT_AMBULATORY_CARE_PROVIDER_SITE_OTHER): Payer: Medicare Other | Admitting: Gastroenterology

## 2018-01-02 VITALS — BP 120/70 | HR 76 | Ht 67.0 in | Wt 176.8 lb

## 2018-01-02 DIAGNOSIS — K5909 Other constipation: Secondary | ICD-10-CM | POA: Diagnosis not present

## 2018-01-02 DIAGNOSIS — S3669XA Other injury of rectum, initial encounter: Secondary | ICD-10-CM

## 2018-01-02 DIAGNOSIS — K625 Hemorrhage of anus and rectum: Secondary | ICD-10-CM

## 2018-01-02 DIAGNOSIS — K602 Anal fissure, unspecified: Secondary | ICD-10-CM

## 2018-01-02 MED ORDER — AMBULATORY NON FORMULARY MEDICATION: 1 refills | Status: DC

## 2018-01-02 NOTE — Progress Notes (Signed)
Tiffany Mendoza    [id number]    [birth year]  Primary Care Physician:[name], Norwood Levo, MD  Referring Physician: Fayrene Helper, MD [address] [name], [place] [address]  Chief complaint:  BRBPR  HPI: 21 yr F with h/o internal hemorrhoids s/p band ligation x3 and last band ligation was on 11/21/2017.  Patient started having rectal bleeding 2 weeks after hemorrhoidal band ligation and subsequently had 3 or 4 episodes in the past month.  Patient reported doing enema 2 weeks after hemorrhoidal banding and she had some difficulty, had to apply pressure during insertion.  She has been doing intermittent rectal enema and on further questioning it appears that the bleeding is worse after she performs an enema.  Denies any significant anorectal discomfort.  She has dyspepsia and generalized intermittent abdominal cramping but no abdominal pain.  No change in bowel habits.   Outpatient Encounter Medications as of 01/02/2018  Medication Sig  . CALCIUM PO Take 1 Dose by mouth as needed (supplement).  . clorazepate (TRANXENE) 7.5 MG tablet TAKE 1 TABLET BY MOUTH TWICE DAILY AS NEEDED  . fish oil-omega-3 fatty acids 1000 MG capsule Take 1 g by mouth daily as needed.   . hydrochlorothiazide (HYDRODIURIL) 25 MG tablet Take 1 tablet (25 mg total) by mouth daily.  . Nutritional Supplements (NUTRITIONAL SUPPLEMENT PO) Take by mouth. Psyllium Husk  . Polyethylene Glycol 3350 (MIRALAX PO) Take by mouth daily as needed.   . Potassium 99 MG TABS Take by mouth daily.    . ranitidine (ZANTAC) 150 MG capsule Take 150 mg as needed by mouth.   . sertraline (ZOLOFT) 100 MG tablet Take 1 tablet (100 mg total) by mouth daily.  Marland Kitchen Specialty Vitamins Products (BIOTIN PLUS KERATIN PO) Take by mouth daily.  . [DISCONTINUED] COD LIVER OIL PO Take 1 Dose by mouth as needed (supplement).   No facility-administered encounter medications on file as of 01/02/2018.     Allergies as of 01/02/2018   . (No Known Allergies)    Past Medical History:  Diagnosis Date  . Adjustment disorder with mixed anxiety and depressed mood   . Allergy   . Carotid bruit   . Depression   . Dermatophytosis of foot   . GERD (gastroesophageal reflux disease)   . Hypertension   . Insomnia   . Neck pain   . Osteoporosis     Past Surgical History:  Procedure Laterality Date  . ABDOMINAL HYSTERECTOMY  1988  . HEMORRHOID SURGERY    . myoectomy  1984    Family History  Problem Relation Age of Onset  . Colon polyps Father   . Prostate cancer Father   . ALS Father   . Thyroid disease Sister   . Thyroid nodules Sister        Thyroidectomy in 1984  . Memory loss Mother   . Fibroids Sister   . Hypertension Sister   . Liver cancer Paternal Grandmother     Social History   Socioeconomic History  . Marital status: Divorced    Spouse name: Not on file  . Number of children: Not on file  . Years of education: Not on file  . Highest education level: Not on file  Occupational History  . Not on file  Social Needs  . Financial resource strain: Not on file  . Food insecurity:    Worry: Not on file    Inability: Not on  file  . Transportation needs:    Medical: Not on file    Non-medical: Not on file  Tobacco Use  . Smoking status: Never Smoker  . Smokeless tobacco: Never Used  Substance and Sexual Activity  . Alcohol use: Yes    Comment: occasional wine  . Drug use: No  . Sexual activity: Not Currently  Lifestyle  . Physical activity:    Days per week: Not on file    Minutes per session: Not on file  . Stress: Not on file  Relationships  . Social connections:    Talks on phone: Not on file    Gets together: Not on file    Attends religious service: Not on file    Active member of club or organization: Not on file    Attends meetings of clubs or organizations: Not on file    Relationship status: Not on file  . Intimate partner violence:    Fear of current or ex partner: Not on  file    Emotionally abused: Not on file    Physically abused: Not on file    Forced sexual activity: Not on file  Other Topics Concern  . Not on file  Social History Narrative  . Not on file      Review of systems: Review of Systems  Constitutional: Negative for fever and chills.  HENT: Negative.   Eyes: Negative for blurred vision.  Respiratory: Negative for cough, shortness of breath and wheezing.   Cardiovascular: Negative for chest pain and palpitations.  Gastrointestinal: as per HPI Genitourinary: Negative for dysuria, urgency, frequency and hematuria.  Musculoskeletal: Negative for myalgias, back pain and joint pain.  Skin: Negative for itching and rash.  Neurological: Negative for dizziness, tremors, focal weakness, seizures and loss of consciousness.  Endo/Heme/Allergies: Negative for seasonal allergies.  Psychiatric/Behavioral: Negative for depression, suicidal ideas and hallucinations.  All other systems reviewed and are negative.   Physical Exam: Vitals:   01/02/18 1439  BP: 120/70  Pulse: 76   Body mass index is 27.69 kg/m. Gen:      No acute distress HEENT:  EOMI, sclera anicteric Neck:     No masses; no thyromegaly Lungs:    Clear to auscultation bilaterally; normal respiratory effort CV:         Regular rate and rhythm; no murmurs Abd:      + bowel sounds; soft, non-tender; no palpable masses, no distension Ext:    No edema; adequate peripheral perfusion Skin:      Warm and dry; no rash Neuro: alert and oriented x 3 Psych: normal mood and affect Rectal exam: Decreased anal sphincter tone, no external hemorrhoids, partial rectal prolapse Anoscopy:  well-healed scar in the right anterior position from previous hemorrhoidal banding, rectal superficial mucosal tear noted in anterior and lateral position with no active bleeding, normal dentate line, no visible nodules  Data Reviewed:  Reviewed labs, radiology imaging, old records and pertinent past GI  work up   Assessment and Plan/Recommendations:  67 year old female with history of chronic irritable bowel syndrome, constipation, internal hemorrhoids status post band ligation with rectal bleeding On rectal exam and anoscopy patient has rectal mucosal tears anterior and left lateral positions likely secondary to rectal enema  Advised patient to avoid rectal enema Benefiber 1 tablespoon 3 times daily with meals MiraLAX 1-2 capfuls daily, titrate as needed to have daily soft bowel movement 0.125% nitroglycerin per rectum 2-3 times daily for 4 to 6 weeks Return as needed 25 minutes  was spent face-to-face with the patient. Greater than 50% of the time used for counseling as well as treatment plan and follow-up. She had multiple questions which were answered to her satisfaction  K. Denzil Magnuson , MD [phone]    CC: Fayrene Helper, MD

## 2018-01-02 NOTE — Patient Instructions (Addendum)
We have given you a printed prescription of Nitroglycerin ointment to take to [facility] 1 tablespoon three times a day with meals  Take Miralax 1-2 capfuls daily  STOP ENEMAS  Follow up as needed  Thank you for choosing [facility]  Karleen Hampshire Nandigam,MD

## 2018-01-04 ENCOUNTER — Encounter: Payer: Self-pay | Admitting: Gastroenterology

## 2018-01-04 ENCOUNTER — Telehealth: Payer: Self-pay | Admitting: Gastroenterology

## 2018-01-04 NOTE — Telephone Encounter (Signed)
Medication information given at the pharmacy confused her on why and how she was to use the Nitroglycerin ointment.  Reviewed instructions and the purpose of this medication.

## 2018-01-11 ENCOUNTER — Encounter: Payer: Self-pay | Admitting: Family Medicine

## 2018-01-11 ENCOUNTER — Other Ambulatory Visit: Payer: Self-pay

## 2018-01-11 ENCOUNTER — Ambulatory Visit (INDEPENDENT_AMBULATORY_CARE_PROVIDER_SITE_OTHER): Payer: Medicare Other | Admitting: Family Medicine

## 2018-01-11 VITALS — BP 126/78 | HR 82 | Resp 12 | Ht 67.0 in | Wt 176.0 lb

## 2018-01-11 DIAGNOSIS — F418 Other specified anxiety disorders: Secondary | ICD-10-CM

## 2018-01-11 DIAGNOSIS — I10 Essential (primary) hypertension: Secondary | ICD-10-CM

## 2018-01-11 DIAGNOSIS — K219 Gastro-esophageal reflux disease without esophagitis: Secondary | ICD-10-CM

## 2018-01-11 DIAGNOSIS — E663 Overweight: Secondary | ICD-10-CM | POA: Diagnosis not present

## 2018-01-11 NOTE — Progress Notes (Signed)
Tiffany Mendoza     MRN: [id number]      DOB: May 28, 1951   HPI Tiffany Mendoza is here for follow up and re-evaluation of chronic medical conditions, medication management and review of any available recent lab and radiology data.  Preventive health is updated, specifically  Cancer screening and Immunization.   Recently had anal fissure managed through GI Has had progress made with her brother in law getting involved and taking charge of coordinating her Mother's care between her siblings so she is having the responsibility shared. The PT denies any adverse reactions to current medications since the last visit.  There are no new concerns.  There are no specific complaints   ROS Denies recent fever or chills. Denies sinus pressure, nasal congestion, ear pain or sore throat. Denies chest congestion, productive cough or wheezing. Denies chest pains, palpitations and leg swelling Denies abdominal pain, nausea, vomiting,diarrhea or constipation.   Denies dysuria, frequency, hesitancy or incontinence. Denies joint pain, swelling and limitation in mobility. Denies headaches, seizures, numbness, or tingling. Denies uncontrolled depression, anxiety or insomnia. Denies skin break down or rash.   PE  BP 126/78 (BP Location: Right Arm, Patient Position: Sitting, Cuff Size: Normal)   Pulse 82   Resp 12   Ht 5\' 7"  (1.702 m)   Wt 176 lb (79.8 kg)   SpO2 98%   BMI 27.57 kg/m   Patient alert and oriented and in no cardiopulmonary distress.  HEENT: No facial asymmetry, EOMI,   oropharynx pink and moist.  Neck supple no JVD, no mass.  Chest: Clear to auscultation bilaterally.  CVS: S1, S2 no murmurs, no S3.Regular rate.  ABD: Soft non tender.   Ext: No edema  MS: Adequate ROM spine, shoulders, hips and knees.  Skin: Intact, no ulcerations or rash noted.  Psych: Good eye contact, normal affect. Memory intact not anxious or depressed appearing.  CNS: CN 2-12 intact, power,  normal  throughout.no focal deficits noted.   Assessment & Plan  Essential hypertension Controlled, no change in medication DASH diet and commitment to daily physical activity for a minimum of 30 minutes discussed and encouraged, as a part of hypertension management. The importance of attaining a healthy weight is also discussed.  BP/Weight 01/11/2018 01/02/2018 11/21/2017 11/16/2017 09/29/2017 08/16/2017 40/98/1191  Systolic BP 478 295 621 308 657 846 962  Diastolic BP 78 70 74 76 78 82 84  Wt. (Lbs) 176 176.8 180 181 182.25 184.25 179  BMI 27.57 27.69 28.19 27.52 28.54 28.86 28.04       Depression with anxiety Improved , follow ng recent meeting with her siblings in the office regarding her Mother's care changes have been made where she is getting more assistance which is a good thing. He will reach out to the therapist who was working with her through the Alzheimer's support group which she found to be very beneficial She is to continue her current medication  GERD Controlled with only antacids as needed  Overweight (BMI 25.0-29.9) Improved. Patient re-educated about  the importance of commitment to a  minimum of 150 minutes of exercise per week.  The importance of healthy food choices with portion control discussed. Encouraged to start a food diary, count calories and to consider  joining a support group. Sample diet sheets offered. Goals set by the patient for the next several months.   Weight /BMI 01/11/2018 01/02/2018 11/21/2017  WEIGHT 176 lb 176 lb 12.8 oz 180 lb  HEIGHT 5\' 7"  5\' 7"   5\' 7"   BMI 27.57 kg/m2 27.69 kg/m2 28.19 kg/m2

## 2018-01-11 NOTE — Patient Instructions (Addendum)
Wellness with nurse  Past due , please schedule in late August/ Septemebr  MD follow up in 4 months  Please reach out again to the your support group for Dementia support   No medication changes  It is important that you exercise regularly at least 30 minutes 5 times a week. If you develop chest pain, have severe difficulty breathing, or feel very tired, stop exercising immediately and seek medical attention

## 2018-01-12 ENCOUNTER — Telehealth: Payer: Self-pay

## 2018-01-12 NOTE — Telephone Encounter (Signed)
[facility] - Left Msg 

## 2018-01-14 ENCOUNTER — Encounter: Payer: Self-pay | Admitting: Family Medicine

## 2018-01-14 NOTE — Assessment & Plan Note (Signed)
Improved. Patient re-educated about  the importance of commitment to a  minimum of 150 minutes of exercise per week.  The importance of healthy food choices with portion control discussed. Encouraged to start a food diary, count calories and to consider  joining a support group. Sample diet sheets offered. Goals set by the patient for the next several months.   Weight /BMI 01/11/2018 01/02/2018 11/21/2017  WEIGHT 176 lb 176 lb 12.8 oz 180 lb  HEIGHT 5\' 7"  5\' 7"  5\' 7"   BMI 27.57 kg/m2 27.69 kg/m2 28.19 kg/m2

## 2018-01-14 NOTE — Assessment & Plan Note (Signed)
Controlled with only antacids as needed

## 2018-01-14 NOTE — Assessment & Plan Note (Signed)
Controlled, no change in medication DASH diet and commitment to daily physical activity for a minimum of 30 minutes discussed and encouraged, as a part of hypertension management. The importance of attaining a healthy weight is also discussed.  BP/Weight 01/11/2018 01/02/2018 11/21/2017 11/16/2017 09/29/2017 08/16/2017 48/25/0037  Systolic BP 048 889 169 450 388 828 003  Diastolic BP 78 70 74 76 78 82 84  Wt. (Lbs) 176 176.8 180 181 182.25 184.25 179  BMI 27.57 27.69 28.19 27.52 28.54 28.86 28.04

## 2018-01-14 NOTE — Assessment & Plan Note (Signed)
Improved , follow ng recent meeting with her siblings in the office regarding her Mother's care changes have been made where she is getting more assistance which is a good thing. He will reach out to the therapist who was working with her through the Alzheimer's support group which she found to be very beneficial She is to continue her current medication

## 2018-01-17 ENCOUNTER — Other Ambulatory Visit: Payer: Self-pay | Admitting: Family Medicine

## 2018-01-17 MED ORDER — SERTRALINE HCL 50 MG PO TABS
50.0000 mg | ORAL_TABLET | Freq: Every day | ORAL | 5 refills | Status: DC
Start: 2018-01-17 — End: 2018-09-07

## 2018-01-18 ENCOUNTER — Telehealth: Payer: Self-pay

## 2018-01-18 NOTE — Telephone Encounter (Signed)
[facility] - Left Msg 

## 2018-01-25 ENCOUNTER — Telehealth: Payer: Self-pay

## 2018-01-25 NOTE — Telephone Encounter (Signed)
[facility] - 2nd attempt  

## 2018-01-29 ENCOUNTER — Other Ambulatory Visit: Payer: Self-pay | Admitting: Family Medicine

## 2018-01-29 DIAGNOSIS — Z1231 Encounter for screening mammogram for malignant neoplasm of breast: Secondary | ICD-10-CM

## 2018-02-04 ENCOUNTER — Other Ambulatory Visit: Payer: Self-pay | Admitting: Family Medicine

## 2018-02-05 ENCOUNTER — Other Ambulatory Visit: Payer: Self-pay

## 2018-02-14 ENCOUNTER — Encounter: Payer: Medicare Other | Admitting: Gastroenterology

## 2018-02-22 ENCOUNTER — Telehealth: Payer: Self-pay

## 2018-02-22 NOTE — Telephone Encounter (Signed)
Several attempts have been made to contact patient without success. Patient will be placed on the inactive list.  If services are needed again.  Please contact [facility] at [phone].    Information will be routed to the PCP and Dr. Hisada  

## 2018-03-02 ENCOUNTER — Ambulatory Visit: Payer: Medicare Other | Admitting: Gastroenterology

## 2018-04-24 ENCOUNTER — Other Ambulatory Visit (HOSPITAL_COMMUNITY): Payer: Self-pay | Admitting: *Deleted

## 2018-04-24 DIAGNOSIS — R768 Other specified abnormal immunological findings in serum: Secondary | ICD-10-CM

## 2018-04-24 DIAGNOSIS — M899 Disorder of bone, unspecified: Secondary | ICD-10-CM

## 2018-04-25 ENCOUNTER — Other Ambulatory Visit (HOSPITAL_COMMUNITY): Payer: Medicare Other

## 2018-04-26 ENCOUNTER — Ambulatory Visit (INDEPENDENT_AMBULATORY_CARE_PROVIDER_SITE_OTHER): Payer: Medicare Other

## 2018-04-26 ENCOUNTER — Inpatient Hospital Stay (HOSPITAL_COMMUNITY): Payer: Medicare Other | Attending: Hematology

## 2018-04-26 DIAGNOSIS — Z79899 Other long term (current) drug therapy: Secondary | ICD-10-CM | POA: Insufficient documentation

## 2018-04-26 DIAGNOSIS — R768 Other specified abnormal immunological findings in serum: Secondary | ICD-10-CM | POA: Insufficient documentation

## 2018-04-26 DIAGNOSIS — I10 Essential (primary) hypertension: Secondary | ICD-10-CM | POA: Diagnosis not present

## 2018-04-26 DIAGNOSIS — R809 Proteinuria, unspecified: Secondary | ICD-10-CM | POA: Insufficient documentation

## 2018-04-26 DIAGNOSIS — R9389 Abnormal findings on diagnostic imaging of other specified body structures: Secondary | ICD-10-CM | POA: Diagnosis present

## 2018-04-26 DIAGNOSIS — Z23 Encounter for immunization: Secondary | ICD-10-CM

## 2018-04-26 LAB — COMPREHENSIVE METABOLIC PANEL
ALT: 20 U/L (ref 0–44)
AST: 22 U/L (ref 15–41)
Albumin: 4.1 g/dL (ref 3.5–5.0)
Alkaline Phosphatase: 46 U/L (ref 38–126)
Anion gap: 9 (ref 5–15)
BUN: 9 mg/dL (ref 8–23)
CO2: 27 mmol/L (ref 22–32)
Calcium: 9.4 mg/dL (ref 8.9–10.3)
Chloride: 105 mmol/L (ref 98–111)
Creatinine, Ser: 0.75 mg/dL (ref 0.44–1.00)
GFR calc Af Amer: >60 mL/min (ref >60)
GFR calc non Af Amer: >60 mL/min (ref >60)
Glucose, Bld: 109 mg/dL — ABNORMAL HIGH (ref 70–99)
Potassium: 3.5 mmol/L (ref 3.5–5.1)
Sodium: 141 mmol/L (ref 135–145)
Total Bilirubin: 0.7 mg/dL (ref 0.3–1.2)
Total Protein: 7.5 g/dL (ref 6.5–8.1)

## 2018-04-26 LAB — CBC WITH DIFFERENTIAL/PLATELET
Abs Immature Granulocytes: 0.01 10*3/uL (ref 0.00–0.07)
Basophils Absolute: 0 10*3/uL (ref 0.0–0.1)
Basophils Relative: 0 %
Eosinophils Absolute: 0.3 10*3/uL (ref 0.0–0.5)
Eosinophils Relative: 5 %
HCT: 40 % (ref 36.0–46.0)
Hemoglobin: 13 g/dL (ref 12.0–15.0)
Immature Granulocytes: 0 %
Lymphocytes Relative: 45 %
Lymphs Abs: 2.1 10*3/uL (ref 0.7–4.0)
MCH: 30.5 pg (ref 26.0–34.0)
MCHC: 32.5 g/dL (ref 30.0–36.0)
MCV: 93.9 fL (ref 80.0–100.0)
Monocytes Absolute: 0.4 10*3/uL (ref 0.1–1.0)
Monocytes Relative: 9 %
Neutro Abs: 1.9 10*3/uL (ref 1.7–7.7)
Neutrophils Relative %: 41 %
Platelets: 218 10*3/uL (ref 150–400)
RBC: 4.26 MIL/uL (ref 3.87–5.11)
RDW: 12.4 % (ref 11.5–15.5)
WBC: 4.7 10*3/uL (ref 4.0–10.5)
nRBC: 0 % (ref 0.0–0.2)

## 2018-04-27 LAB — BETA 2 MICROGLOBULIN, SERUM: Beta-2 Microglobulin: 1.7 mg/L (ref 0.6–2.4)

## 2018-04-27 LAB — KAPPA/LAMBDA LIGHT CHAINS
Kappa free light chain: 19.4 mg/L (ref 3.3–19.4)
Kappa, lambda light chain ratio: 1.98 — ABNORMAL HIGH (ref 0.26–1.65)
Lambda free light chains: 9.8 mg/L (ref 5.7–26.3)

## 2018-04-30 ENCOUNTER — Telehealth: Payer: Self-pay | Admitting: Family Medicine

## 2018-04-30 LAB — PROTEIN ELECTROPHORESIS, SERUM
A/G Ratio: 1.3 (ref 0.7–1.7)
Albumin ELP: 3.8 g/dL (ref 2.9–4.4)
Alpha-1-Globulin: 0.2 g/dL (ref 0.0–0.4)
Alpha-2-Globulin: 0.8 g/dL (ref 0.4–1.0)
Beta Globulin: 0.9 g/dL (ref 0.7–1.3)
Gamma Globulin: 1.1 g/dL (ref 0.4–1.8)
Globulin, Total: 3 g/dL (ref 2.2–3.9)
Total Protein ELP: 6.8 g/dL (ref 6.0–8.5)

## 2018-04-30 NOTE — Telephone Encounter (Signed)
Pt LVM that she is having stomach issues, please call her to advise what she can take

## 2018-04-30 NOTE — Telephone Encounter (Signed)
Called and she was expressing concern over new bowel issues. Appt scheduled

## 2018-05-01 ENCOUNTER — Ambulatory Visit (INDEPENDENT_AMBULATORY_CARE_PROVIDER_SITE_OTHER): Payer: Medicare Other | Admitting: Family Medicine

## 2018-05-01 ENCOUNTER — Encounter: Payer: Self-pay | Admitting: Family Medicine

## 2018-05-01 VITALS — BP 135/84 | HR 90 | Resp 12 | Ht 67.0 in | Wt 177.1 lb

## 2018-05-01 DIAGNOSIS — I10 Essential (primary) hypertension: Secondary | ICD-10-CM | POA: Diagnosis not present

## 2018-05-01 DIAGNOSIS — E663 Overweight: Secondary | ICD-10-CM

## 2018-05-01 DIAGNOSIS — F418 Other specified anxiety disorders: Secondary | ICD-10-CM

## 2018-05-01 DIAGNOSIS — K589 Irritable bowel syndrome without diarrhea: Secondary | ICD-10-CM

## 2018-05-01 DIAGNOSIS — R109 Unspecified abdominal pain: Secondary | ICD-10-CM | POA: Diagnosis not present

## 2018-05-01 DIAGNOSIS — R194 Change in bowel habit: Secondary | ICD-10-CM | POA: Insufficient documentation

## 2018-05-01 NOTE — Assessment & Plan Note (Addendum)
Worse with anxiety and stress happening in past 1 year, needs and will benefit from specific therapy already using Alzheimer's support  group

## 2018-05-01 NOTE — Progress Notes (Signed)
Tiffany Mendoza     MRN: [id number]      DOB: 05-03-1951   HPI Tiffany Mendoza is here for follow up and re-evaluation of chronic medical conditions, medication management and review of any available recent lab and radiology data.  Preventive health is updated, specifically  Cancer screening and Immunization.   Questions or concerns regarding consultations or procedures which the PT has had in the interim are  addressed. The PT states she was unable to tolerate zoloft 100 mgf dosed that she had asked for , so is taking the 50 mg dose.Gets counselling for copinfg with her stress from support services. Diarrhea and cramping with loose stool linked with emotional distress lives with Mother with dementia and she  Is finding caring for increasingly difficult and working with her siblings still in helping to care for her mother ROS Denies recent fever or chills. Denies sinus pressure, nasal congestion, ear pain or sore throat. Denies chest congestion, productive cough or wheezing. Denies chest pains, palpitations and leg swelling    Denies dysuria, frequency, hesitancy or incontinence. Denies joint pain, swelling and limitation in mobility. Denies headaches, seizures, numbness, or tingling.  Denies skin break down or rash.   PE  BP 135/84 (BP Location: Right Arm, Patient Position: Sitting, Cuff Size: Normal)   Pulse 90   Resp 12   Ht 5\' 7"  (1.702 m)   Wt 177 lb 1.9 oz (80.3 kg)   SpO2 96% Comment: room air  BMI 27.74 kg/m   Patient alert and oriented and in no cardiopulmonary distress.  HEENT: No facial asymmetry, EOMI,   oropharynx pink and moist.  Neck supple no JVD, no mass.  Chest: Clear to auscultation bilaterally.  CVS: S1, S2 no murmurs, no S3.Regular rate.  ABD: Soft non tender.   Ext: No edema  MS: Adequate ROM spine, shoulders, hips and knees.  Skin: Intact, no ulcerations or rash noted.  Psych: Good eye contact, normal affect. Memory intact mildly  anxious not   depressed appearing.  CNS: CN 2-12 intact, power,  normal throughout.no focal deficits noted.   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 Patient re-educated about  the importance of commitment to a  minimum of 150 minutes of exercise per week.  The importance of healthy food choices with portion control discussed. Encouraged to start a food diary, count calories and to consider  joining a support group. Sample diet sheets offered. Goals set by the patient for the next several months.   Weight /BMI 05/02/2018 05/01/2018 01/11/2018  WEIGHT 178 lb 4.8 oz 177 lb 1.9 oz 176 lb  HEIGHT - 5\' 7"  5\' 7"   BMI 27.93 kg/m2 27.74 kg/m2 27.57 kg/m2      Essential hypertension Controlled, no change in medication DASH diet and commitment to daily physical activity for a minimum of 30 minutes discussed and encouraged, as a part of hypertension management. The importance of attaining a healthy weight is also discussed.  BP/Weight 05/02/2018 05/01/2018 01/11/2018 01/02/2018 11/21/2017 11/16/2017 5/88/5027  Systolic BP 741 287 867 672 094 709 628  Diastolic BP 79 84 78 70 74 76 78  Wt. (Lbs) 178.3 177.12 176 176.8 180 181 182.25  BMI 27.93 27.74  27.57 27.69 28.19 27.52 28.54       Depression with anxiety No change In medication dose, needs therapy, still resistant to additional support due to financial stress incurred with same '

## 2018-05-01 NOTE — Assessment & Plan Note (Signed)
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

## 2018-05-01 NOTE — Patient Instructions (Addendum)
F/U in 5 months, call if t you need me before  Cancel December visit  Needs wellness with Nurse before year end  Vitamin d and all other labs will  Be done in May, you will get orders next year

## 2018-05-02 ENCOUNTER — Encounter (HOSPITAL_COMMUNITY): Payer: Self-pay | Admitting: Internal Medicine

## 2018-05-02 ENCOUNTER — Other Ambulatory Visit: Payer: Self-pay

## 2018-05-02 ENCOUNTER — Other Ambulatory Visit (HOSPITAL_COMMUNITY): Payer: Medicare Other

## 2018-05-02 ENCOUNTER — Inpatient Hospital Stay (HOSPITAL_BASED_OUTPATIENT_CLINIC_OR_DEPARTMENT_OTHER): Payer: Medicare Other | Admitting: Internal Medicine

## 2018-05-02 VITALS — BP 122/79 | HR 81 | Temp 98.5°F | Resp 18 | Wt 178.3 lb

## 2018-05-02 DIAGNOSIS — Z79899 Other long term (current) drug therapy: Secondary | ICD-10-CM

## 2018-05-02 DIAGNOSIS — R809 Proteinuria, unspecified: Secondary | ICD-10-CM

## 2018-05-02 DIAGNOSIS — R9389 Abnormal findings on diagnostic imaging of other specified body structures: Secondary | ICD-10-CM | POA: Diagnosis not present

## 2018-05-02 DIAGNOSIS — R768 Other specified abnormal immunological findings in serum: Secondary | ICD-10-CM | POA: Diagnosis not present

## 2018-05-02 DIAGNOSIS — I10 Essential (primary) hypertension: Secondary | ICD-10-CM | POA: Diagnosis not present

## 2018-05-02 LAB — IMMUNOFIXATION ELECTROPHORESIS
IgA: 173 mg/dL (ref 87–352)
IgG (Immunoglobin G), Serum: 1291 mg/dL (ref 700–1600)
IgM (Immunoglobulin M), Srm: 44 mg/dL (ref 26–217)
Total Protein ELP: 7 g/dL (ref 6.0–8.5)

## 2018-05-02 NOTE — Progress Notes (Signed)
Diagnosis Elevated serum immunoglobulin free light chain level  Abnormal x-ray - Plan: CBC with Differential/Platelet, Comprehensive metabolic panel, Lactate dehydrogenase, Protein electrophoresis, serum, IgG, IgA, IgM, Kappa/lambda light chains  Staging Cancer Staging No matching staging information was found for the patient.  Assessment and Plan:    ASSESSMENT & PLAN: 1.  Abnormal Xray.  Pt had Right shoulder film on 08/21/2015 with oval areas of lucency in the humeral head and neck as well as in the scapula. Abnormal kappa/lamda light chain ratio, serum Bence jones proteinuria BMBX 10/06/2015 trilineage hematopoiesis, plasmacytosis 6%, polyclonal  Patient previously followed by Dr. Talbert Cage and [facility] and pt was informed her clinical picture does not fit that of multiple myeloma.    Labs done 04/26/2018 reviewed with pt and showed WBC 4.7 HB 13 plts 218,000.  Chemistries WNL with K+ 3.5 Cr 0.75, Calcium 9.4 and normal LFTs.   SPEP shows no monoclonal protein.  Quant IG WNL,  FLC ratio 1.9.    Pt will remain on observation and is now being seen yearly with labs and she will RTC in 04/2019 for follow-up to go over labs.    2.  Mom with Dementia and Divorce.  Pt speaks about problems with mom with dementia.  Will ask for social work to speak with pt regarding local resources such as support groups.    3.  HTN.  BP is 122/79.  Follow-up with PCP as directed.    4  Health maintenance.  Mammogram and colonoscopy screening as recommended.     Current Status:  Pt is seen today for follow-up.  She is here to go over labs.  She reports she is taking care of mom with Dementia and has gone through divorce.     METASTATIC BONE SURVEY 10/20/2016  IMPRESSION: No new or enlarging suspicious lytic lesions. Questionable subtle very small lucency in the proximal left humerus is stable. No definite evidence for multiple myeloma or metastatic disease.  PATHOLOGY    Problem List Patient Active  Problem List   Diagnosis Date Noted  . Abdominal cramping [R10.9] 05/01/2018  . Change in bowel habit [R19.4] 05/01/2018  . Cutaneous skin tags [L91.8] 10/28/2016  . Elevated serum immunoglobulin free light chain level [R76.8] 10/20/2016  . Overweight (BMI 25.0-29.9) [E66.3] 05/09/2015  . IFG (impaired fasting glucose) [R73.01] 11/11/2013  . UNSPECIFIED OSTEOPOROSIS [M81.0] 06/04/2010  . Depression with anxiety [F41.8] 07/09/2009  . 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 EXTERNAL HEMORRHOIDS [K64.4] 10/02/2008  . ALLERGIC RHINITIS CAUSE UNSPECIFIED [J30.9] 08/21/2008  . Essential hypertension [I10] 08/12/2008  . GERD [K21.9] 08/12/2008    Past Medical History Past Medical History:  Diagnosis Date  . Adjustment disorder with mixed anxiety and depressed mood   . Allergy   . Carotid bruit   . Depression   . Dermatophytosis of foot   . GERD (gastroesophageal reflux disease)   . Hypertension   . Insomnia   . Neck pain   . Osteoporosis     Past Surgical History Past Surgical History:  Procedure Laterality Date  . ABDOMINAL HYSTERECTOMY  1988  . HEMORRHOID SURGERY    . myoectomy  1984    Family History Family History  Problem Relation Age of Onset  . Colon polyps Father   . Prostate cancer Father   . ALS Father   . Thyroid disease Sister   . Thyroid nodules Sister        Thyroidectomy in 1984  . Memory loss Mother   .  Fibroids Sister   . Hypertension Sister   . Liver cancer Paternal Grandmother      Social History  reports that she has never smoked. She has never used smokeless tobacco. She reports that she drinks alcohol. She reports that she does not use drugs.  Medications  Current Outpatient Medications:  .  Cholecalciferol (VITAMIN D3 PO), Take 5,000 Units by mouth daily., Disp: , Rfl:  .  clorazepate (TRANXENE) 7.5 MG tablet, TAKE 1 TABLET BY MOUTH TWICE DAILY AS NEEDED, Disp: 60 tablet, Rfl: 4 .  fish oil-omega-3 fatty acids 1000 MG  capsule, Take 1 g by mouth daily as needed. , Disp: , Rfl:  .  hydrochlorothiazide (HYDRODIURIL) 25 MG tablet, Take 1 tablet (25 mg total) by mouth daily., Disp: 90 tablet, Rfl: 1 .  Multiple Vitamin (MULTIVITAMIN) tablet, Take 1 tablet by mouth daily., Disp: , Rfl:  .  Polyethylene Glycol 3350 (MIRALAX PO), Take by mouth daily as needed. , Disp: , Rfl:  .  Potassium 99 MG TABS, Take by mouth daily.  , Disp: , Rfl:  .  sertraline (ZOLOFT) 50 MG tablet, Take 1 tablet (50 mg total) by mouth daily., Disp: 30 tablet, Rfl: 5 .  Specialty Vitamins Products (BIOTIN PLUS KERATIN PO), Take by mouth daily., Disp: , Rfl:   Allergies Patient has no known allergies.  Review of Systems Review of Systems - Oncology ROS negative other than social concerns regarding mom.    Physical Exam  Vitals Wt Readings from Last 3 Encounters:  05/02/18 178 lb 4.8 oz (80.9 kg)  05/01/18 177 lb 1.9 oz (80.3 kg)  01/11/18 176 lb (79.8 kg)   Temp Readings from Last 3 Encounters:  05/02/18 98.5 F (36.9 C) (Oral)  06/15/17 (!) 96.2 F (35.7 C)  04/28/17 97.9 F (36.6 C) (Oral)   BP Readings from Last 3 Encounters:  05/02/18 122/79  05/01/18 135/84  01/11/18 126/78   Pulse Readings from Last 3 Encounters:  05/02/18 81  05/01/18 90  01/11/18 82   Constitutional: Well-developed, well-nourished, and in no distress.   HENT: Head: Normocephalic and atraumatic.  Mouth/Throat: No oropharyngeal exudate. Mucosa moist. Eyes: Pupils are equal, round, and reactive to light. Conjunctivae are normal. No scleral icterus.  Neck: Normal range of motion. Neck supple. No JVD present.  Cardiovascular: Normal rate, regular rhythm and normal heart sounds.  Exam reveals no gallop and no friction rub.   No murmur heard. Pulmonary/Chest: Effort normal and breath sounds normal. No respiratory distress. No wheezes.No rales.  Abdominal: Soft. Bowel sounds are normal. No distension. There is no tenderness. There is no guarding.    Musculoskeletal: No edema or tenderness.  Lymphadenopathy: No cervical, axillary or supraclavicular adenopathy.  Neurological: Alert and oriented to person, place, and time. No cranial nerve deficit.  Skin: Skin is warm and dry. No rash noted. No erythema. No pallor.  Psychiatric: Affect and judgment normal.   Labs No visits with results within 3 Day(s) from this visit.  Latest known visit with results is:  Appointment on 04/26/2018  Component Date Value Ref Range Status  . WBC 04/26/2018 4.7  4.0 - 10.5 K/uL Final  . RBC 04/26/2018 4.26  3.87 - 5.11 MIL/uL Final  . Hemoglobin 04/26/2018 13.0  12.0 - 15.0 g/dL Final  . HCT 04/26/2018 40.0  36.0 - 46.0 % Final  . MCV 04/26/2018 93.9  80.0 - 100.0 fL Final  . MCH 04/26/2018 30.5  26.0 - 34.0 pg Final  . MCHC 04/26/2018  32.5  30.0 - 36.0 g/dL Final  . RDW 04/26/2018 12.4  11.5 - 15.5 % Final  . Platelets 04/26/2018 218  150 - 400 K/uL Final  . nRBC 04/26/2018 0.0  0.0 - 0.2 % Final  . Neutrophils Relative % 04/26/2018 41  % Final  . Neutro Abs 04/26/2018 1.9  1.7 - 7.7 K/uL Final  . Lymphocytes Relative 04/26/2018 45  % Final  . Lymphs Abs 04/26/2018 2.1  0.7 - 4.0 K/uL Final  . Monocytes Relative 04/26/2018 9  % Final  . Monocytes Absolute 04/26/2018 0.4  0.1 - 1.0 K/uL Final  . Eosinophils Relative 04/26/2018 5  % Final  . Eosinophils Absolute 04/26/2018 0.3  0.0 - 0.5 K/uL Final  . Basophils Relative 04/26/2018 0  % Final  . Basophils Absolute 04/26/2018 0.0  0.0 - 0.1 K/uL Final  . Immature Granulocytes 04/26/2018 0  % Final  . Abs Immature Granulocytes 04/26/2018 0.01  0.00 - 0.07 K/uL Final   Performed at [facility], [address] [place], [place] [address]  . Sodium 04/26/2018 141  135 - 145 mmol/L Final  . Potassium 04/26/2018 3.5  3.5 - 5.1 mmol/L Final  . Chloride 04/26/2018 105  98 - 111 mmol/L Final  . CO2 04/26/2018 27  22 - 32 mmol/L Final  . Glucose, Bld 04/26/2018 109* 70 - 99 mg/dL Final  . BUN 04/26/2018 9   8 - 23 mg/dL Final  . Creatinine, Ser 04/26/2018 0.75  0.44 - 1.00 mg/dL Final  . Calcium 04/26/2018 9.4  8.9 - 10.3 mg/dL Final  . Total Protein 04/26/2018 7.5  6.5 - 8.1 g/dL Final  . Albumin 04/26/2018 4.1  3.5 - 5.0 g/dL Final  . AST 04/26/2018 22  15 - 41 U/L Final  . ALT 04/26/2018 20  0 - 44 U/L Final  . Alkaline Phosphatase 04/26/2018 46  38 - 126 U/L Final  . Total Bilirubin 04/26/2018 0.7  0.3 - 1.2 mg/dL Final  . GFR calc non Af Amer 04/26/2018 >60  >60 mL/min Final  . GFR calc Af Amer 04/26/2018 >60  >60 mL/min Final   Comment: (NOTE) The eGFR has been calculated using the CKD EPI equation. This calculation has not been validated in all clinical situations. eGFR's persistently <60 mL/min signify possible Chronic Kidney Disease.   Georgiann Hahn gap 04/26/2018 9  5 - 15 Final   Performed at [facility], [address], [place], [place] [address]  . Beta-2 Microglobulin 04/26/2018 1.7  0.6 - 2.4 mg/L Final   Comment: (NOTE) Siemens Immulite 2000 Immunochemiluminometric assay (ICMA) Values obtained with different assay methods or kits cannot be used interchangeably. Results cannot be interpreted as absolute evidence of the presence or absence of malignant disease. Performed At: [facility] [place], [place] [id number] Rush Farmer MD [id number]   . Kappa free light chain 04/26/2018 19.4  3.3 - 19.4 mg/L Final  . Lamda free light chains 04/26/2018 9.8  5.7 - 26.3 mg/L Final  . Kappa, lamda light chain ratio 04/26/2018 1.98* 0.26 - 1.65 Final   Comment: (NOTE) Performed At: [facility] [place], [place] [id number] Rush Farmer MD [id number]   . Total Protein ELP 04/26/2018 6.8  6.0 - 8.5 g/dL Final  . Albumin ELP 04/26/2018 3.8  2.9 - 4.4 g/dL Final  . Alpha-1-Globulin 04/26/2018 0.2  0.0 - 0.4 g/dL Final  . Alpha-2-Globulin 04/26/2018 0.8  0.4 - 1.0 g/dL Final  . Beta Globulin  04/26/2018 0.9  0.7 - 1.3 g/dL Final  .  Gamma Globulin 04/26/2018 1.1  0.4 - 1.8 g/dL Final  . M-Spike, % 04/26/2018 Not Observed  Not Observed g/dL Final  . SPE Interp. 04/26/2018 Comment   Final   Comment: (NOTE) The SPE pattern appears essentially unremarkable. Evidence of monoclonal protein is not apparent. Performed At: [facility] [place], [place] [id number] Rush Farmer MD [id number]   . Comment 04/26/2018 Comment   Final   Comment: (NOTE) Protein electrophoresis scan will follow via computer, mail, or courier delivery.   Marland Kitchen GLOBULIN, TOTAL 04/26/2018 3.0  2.2 - 3.9 g/dL Corrected  . A/G Ratio 04/26/2018 1.3  0.7 - 1.7 Corrected  . Total Protein ELP 04/26/2018 7.0  6.0 - 8.5 g/dL Final  . IgG (Immunoglobin G), Serum 04/26/2018 1,291  700 - 1,600 mg/dL Final  . IgA 04/26/2018 173  87 - 352 mg/dL Final  . IgM (Immunoglobulin M), Srm 04/26/2018 44  26 - 217 mg/dL Final   Comment: (NOTE) Performed At: [facility] [place], [place] [id number] Rush Farmer MD [id number]   . Immunofixation Result, Serum 04/26/2018 Comment   Corrected   An apparent normal immunofixation pattern.     Pathology Orders Placed This Encounter  Procedures  . CBC with Differential/Platelet    Standing Status:   Future    Standing Expiration Date:   05/02/2020  . Comprehensive metabolic panel    Standing Status:   Future    Standing Expiration Date:   05/02/2020  . Lactate dehydrogenase    Standing Status:   Future    Standing Expiration Date:   05/02/2020  . Protein electrophoresis, serum    Standing Status:   Future    Standing Expiration Date:   05/02/2020  . IgG, IgA, IgM    Standing Status:   Future    Standing Expiration Date:   05/02/2020  . Kappa/lambda light chains    Standing Status:   Future    Standing Expiration Date:   05/02/2020       Zoila Shutter MD

## 2018-05-02 NOTE — Patient Instructions (Signed)
[facility] Discharge Instructions  Today you saw Dr. Higgs   Thank you for choosing [facility] to provide your oncology and hematology care.  To afford each patient quality time with our provider, please arrive at least 15 minutes before your scheduled appointment time.   If you have a lab appointment with the Cancer Center please come in thru the  Main Entrance and check in at the main information desk  You need to re-schedule your appointment should you arrive 10 or more minutes late.  We strive to give you quality time with our providers, and arriving late affects you and other patients whose appointments are after yours.  Also, if you no show three or more times for appointments you may be dismissed from the clinic at the providers discretion.     Again, thank you for choosing [facility].  Our hope is that these requests will decrease the amount of time that you wait before being seen by our physicians.       _____________________________________________________________  Should you have questions after your visit to [facility], please contact our office at [phone] between the hours of 8:00 a.m. and 4:30 p.m.  Voicemails left after 4:00 p.m. will not be returned until the following business day.  For prescription refill requests, have your pharmacy contact our office and allow 72 hours.    Cancer Center Support Programs:   > Cancer Support Group  2nd Tuesday of the month 1pm-2pm, Journey Room   

## 2018-05-08 ENCOUNTER — Encounter (HOSPITAL_COMMUNITY): Payer: Self-pay | Admitting: General Practice

## 2018-05-08 ENCOUNTER — Ambulatory Visit: Payer: Medicare Other

## 2018-05-08 NOTE — Progress Notes (Signed)
Tiffany Mendoza CSW Progress Note  Referral received from oncologist, asked to call patient to assess for psychosocial needs/issues.  Has moved into mother's house after divorce.  Mother (age 69) suffers from dementia, has become increasingly combative and difficult to care for.  Does have resources including caregiver support group, counseling at [facility] for caregiver stress,  voucher for caregiver respite, mother attending support program at [facility].  Patient feels overwhelmed by care needs for mother, wants to move out.  Other family members have not been significantly involved in providing care.  Feels trapped by caregiving, wants to move into her own place.   Lacks finances to obtain housing on her own.  Patient and 2 other sisters are assigned as guardians of patient and could be expected to provide care to mother in equal parts. 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  PCP has prescribed antidepressants, which patient feels are working well.  Will plan to reconnect w counselor at [facility] who has been helpful in the past to patient.  Declined referral to additional Medicare therapist at [facility] outpatient/[place] [phone]).  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  "The only thing that will help me is to move out."   Feels like she has "lost years" to caregiving for mother.  Also discussed options for mother including [facility] and emailed brochure.  PCP will need to assist w referrals for mother's care.  Provided CSW contact information if patient needs further help/resources.  Edwyna Shell, LCSW Clinical Social Worker Phone:  [phone]

## 2018-05-14 ENCOUNTER — Ambulatory Visit (HOSPITAL_COMMUNITY): Payer: Medicare Other

## 2018-05-21 ENCOUNTER — Encounter: Payer: Self-pay | Admitting: Family Medicine

## 2018-05-21 NOTE — Assessment & Plan Note (Signed)
Controlled, no change in medication DASH diet and commitment to daily physical activity for a minimum of 30 minutes discussed and encouraged, as a part of hypertension management. The importance of attaining a healthy weight is also discussed.  BP/Weight 05/02/2018 05/01/2018 01/11/2018 01/02/2018 11/21/2017 11/16/2017 9/78/4784  Systolic BP 128 208 [phone] 747 185  Diastolic BP 79 84 78 70 74 76 78  Wt. (Lbs) 178.3 177.12 176 176.8 180 181 182.25  BMI 27.93 27.74 27.57 27.69 28.19 27.52 28.54

## 2018-05-21 NOTE — Assessment & Plan Note (Addendum)
No change In medication dose, needs therapy, still resistant to additional support due to financial stress incurred with same

## 2018-05-21 NOTE — Assessment & Plan Note (Signed)
unchanged. Patient re-educated about  the importance of commitment to a  minimum of 150 minutes of exercise per week.  The importance of healthy food choices with portion control discussed. Encouraged to start a food diary, count calories and to consider  joining a support group. Sample diet sheets offered. Goals set by the patient for the next several months.   Weight /BMI 05/02/2018 05/01/2018 01/11/2018  WEIGHT 178 lb 4.8 oz 177 lb 1.9 oz 176 lb  HEIGHT - 5\' 7"  5\' 7"   BMI 27.93 kg/m2 27.74 kg/m2 27.57 kg/m2

## 2018-05-23 ENCOUNTER — Ambulatory Visit (INDEPENDENT_AMBULATORY_CARE_PROVIDER_SITE_OTHER): Payer: Medicare Other

## 2018-05-23 ENCOUNTER — Ambulatory Visit: Payer: Medicare Other | Admitting: Family Medicine

## 2018-05-23 VITALS — BP 136/87 | HR 76 | Resp 12 | Ht 67.0 in | Wt 178.0 lb

## 2018-05-23 DIAGNOSIS — Z0000 Encounter for general adult medical examination without abnormal findings: Secondary | ICD-10-CM

## 2018-05-30 ENCOUNTER — Ambulatory Visit (HOSPITAL_COMMUNITY)
Admission: RE | Admit: 2018-05-30 | Discharge: 2018-05-30 | Disposition: A | Discharge status: Home / Self Care | Payer: Medicare Other | Source: Ambulatory Visit | Attending: Family Medicine | Admitting: Family Medicine

## 2018-05-30 ENCOUNTER — Encounter (HOSPITAL_COMMUNITY): Payer: Self-pay

## 2018-05-30 DIAGNOSIS — Z1231 Encounter for screening mammogram for malignant neoplasm of breast: Secondary | ICD-10-CM | POA: Diagnosis present

## 2018-06-05 NOTE — Progress Notes (Signed)
Subjective:   Tiffany Mendoza is a 67 y.o. female who presents for Medicare Annual (Subsequent) preventive examination.  Review of Systems: Cardiac Risk Factors include: advanced age (>46men, >50 women);hypertension;obesity (BMI >30kg/m2)     Objective:     Vitals: BP 136/87   Pulse 76   Resp 12   Ht 5\' 7"  (1.702 m)   Wt 178 lb (80.7 kg)   SpO2 96%   BMI 27.88 kg/m   Body mass index is 27.88 kg/m.  Advanced Directives 05/02/2018 10/20/2016 08/31/2016 04/22/2016 10/21/2015 10/21/2015 10/02/2015  Does Patient Have a Medical Advance Directive? Yes Yes No;Yes Yes Yes Yes -  Type of Advance Directive Living will;Healthcare Power of [place];Living will [place];Living will [place];Living will [place];Living will [place];Living will [place];Living will  Does patient want to make changes to medical advance directive? - - No - Patient declined No - Patient declined - No - Patient declined No - Patient declined  Copy of [place] in Chart? No - copy requested No - copy requested No - copy requested No - copy requested No - copy requested - -  Would patient like information on creating a medical advance directive? - - - - - - -    Tobacco Social History   Tobacco Use  Smoking Status Never Smoker  Smokeless Tobacco Never Used     Counseling given: Not Answered   Clinical Intake:  Pre-visit preparation completed: No  Pain : No/denies pain Pain Score: 0-No pain     BMI - recorded: 27.9 Nutritional Status: BMI 25 -29 Overweight Nutritional Risks: None Diabetes: No  How often do you need to have someone help you when you read instructions, pamphlets, or other written materials from your doctor or pharmacy?: 1 - Never What is the last grade level you completed in school?: 12+  Interpreter Needed?: No  Information entered by ::  Tiffany Hanly LPN   Past Medical History:  Diagnosis Date  . Adjustment disorder with mixed anxiety and depressed mood   . Allergy   . Carotid bruit   . Depression   . Dermatophytosis of foot   . GERD (gastroesophageal reflux disease)   . Hypertension   . Insomnia   . Neck pain   . Osteoporosis    Past Surgical History:  Procedure Laterality Date  . ABDOMINAL HYSTERECTOMY  1988  . HEMORRHOID SURGERY    . myoectomy  1984   Family History  Problem Relation Age of Onset  . Colon polyps Father   . Prostate cancer Father   . ALS Father   . Thyroid disease Sister   . Thyroid nodules Sister        Thyroidectomy in 1984  . Memory loss Mother   . Fibroids Sister   . Hypertension Sister   . Liver cancer Paternal Grandmother    Social History   Socioeconomic History  . Marital status: Single    Spouse name: Not on file  . Number of children: Not on file  . Years of education: 12+  . Highest education level: 12th grade  Occupational History  . Not on file  Social Needs  . Financial resource strain: Not hard at all  . Food insecurity:    Worry: Never true    Inability: Never true  . Transportation needs:    Medical: No    Non-medical: No  Tobacco  Use  . Smoking status: Never Smoker  . Smokeless tobacco: Never Used  Substance and Sexual Activity  . Alcohol use: Yes    Comment: occasional wine  . Drug use: No  . Sexual activity: Not Currently  Lifestyle  . Physical activity:    Days per week: 0 days    Minutes per session: 0 min  . Stress: Very much  Relationships  . Social connections:    Talks on phone: More than three times a week    Gets together: Once a week    Attends religious service: Never    Active member of club or organization: No    Attends meetings of clubs or organizations: Never    Relationship status: Divorced  Other Topics Concern  . Not on file  Social History Narrative   Lives alone with Mother who has dementia     Outpatient  Encounter Medications as of 05/23/2018  Medication Sig  . Cholecalciferol (VITAMIN D3 PO) Take 5,000 Units by mouth daily.  . clorazepate (TRANXENE) 7.5 MG tablet TAKE 1 TABLET BY MOUTH TWICE DAILY AS NEEDED  . fish oil-omega-3 fatty acids 1000 MG capsule Take 1 g by mouth daily as needed.   . hydrochlorothiazide (HYDRODIURIL) 25 MG tablet Take 1 tablet (25 mg total) by mouth daily.  . Multiple Vitamin (MULTIVITAMIN) tablet Take 1 tablet by mouth daily.  . Polyethylene Glycol 3350 (MIRALAX PO) Take by mouth daily as needed.   . Potassium 99 MG TABS Take by mouth daily.    . sertraline (ZOLOFT) 50 MG tablet Take 1 tablet (50 mg total) by mouth daily.  Marland Kitchen Specialty Vitamins Products (BIOTIN PLUS KERATIN PO) Take by mouth daily.   No facility-administered encounter medications on file as of 05/23/2018.     Activities of Daily Living In your present state of health, do you have any difficulty performing the following activities: 05/23/2018  Hearing? N  Vision? N  Difficulty concentrating or making decisions? N  Walking or climbing stairs? N  Dressing or bathing? N  Doing errands, shopping? N  Preparing Food and eating ? N  Using the Toilet? N  In the past six months, have you accidently leaked urine? N  Do you have problems with loss of bowel control? N  Managing your Medications? N  Managing your Finances? N  Housekeeping or managing your Housekeeping? N  Some recent data might be hidden    Patient Care Team: Tiffany Helper, MD as PCP - General Tiffany Fus, MD as Consulting Physician (Obstetrics and Gynecology) Tiffany Guys, MD as Consulting Physician (Ophthalmology)    Assessment:   This is a routine wellness examination for Tiffany Mendoza.  Exercise Activities and Dietary recommendations Current Exercise Habits: The patient does not participate in regular exercise at present  Goals      Patient Stated   . Find place of her own (pt-stated)     She would like to get out and  find a place of her own.      Other   . Patient Stated     Start going to therapy        Fall Risk Fall Risk  05/23/2018 05/01/2018 01/11/2018 11/16/2017 08/31/2016  Falls in the past year? 0 0 No No No  Number falls in past yr: - 0 - - -  Injury with Fall? - 0 - - -   Is the patient's home free of loose throw rugs in walkways, pet beds, electrical cords, etc?  yes      Grab bars in the bathroom? yes      Handrails on the stairs?   yes      Adequate lighting?   yes  Timed Get Up and Go performed: Patient able to perform in 5 seconds without assistance   Depression Screen PHQ 2/9 Scores 05/23/2018 05/01/2018 01/11/2018 12/11/2017  PHQ - 2 Score 5 3 2 6   PHQ- 9 Score 12 7 3 15      Cognitive Function     6CIT Screen 05/23/2018 08/31/2016  What Year? 0 points 0 points  What month? 0 points 0 points  What time? 0 points 0 points  Count back from 20 0 points 0 points  Months in reverse 0 points 0 points  Repeat phrase 0 points 0 points  Total Score 0 0    Immunization History  Administered Date(s) Administered  . Influenza Split 04/18/2011, 08/09/2012  . Influenza Whole 04/29/2008, 04/15/2010  . Influenza, High Dose Seasonal PF 04/26/2018  . Influenza,inj,Quad PF,6+ Mos 03/18/2014, 04/28/2015, 05/18/2016, 05/24/2017  . PPD Test 08/09/2012  . Pneumococcal Conjugate-13 08/14/2015  . Pneumococcal Polysaccharide-23 09/17/2013  . Td 08/14/2007  . Zoster 10/01/2014    Qualifies for Shingles Vaccine?up to date   Screening Tests Health Maintenance  Topic Date Due  . TETANUS/TDAP  11/17/2018 (Originally 08/13/2017)  . MAMMOGRAM  05/30/2020  . COLONOSCOPY  06/16/2027  . INFLUENZA VACCINE  Completed  . DEXA SCAN  Completed  . Hepatitis C Screening  Completed  . PNA vac Low Risk Adult  Completed    Cancer Screenings: Lung: Low Dose CT Chest recommended if Age 44-80 years, 30 pack-year currently smoking OR have quit w/in 15years. Patient does not qualify. Breast:  Up to  date on Mammogram? Yes   Up to date of Bone Density/Dexa? Yes Colorectal: up to date   Additional Screenings:: Hepatitis C Screening: complete     Plan:   Continue to exercise, lose weight and seeking counseling for family issues   I have personally reviewed and noted the following in the patient's chart:   . Medical and social history . Use of alcohol, tobacco or illicit drugs  . Current medications and supplements . Functional ability and status . Nutritional status . Physical activity . Advanced directives . List of other physicians . Hospitalizations, surgeries, and ER visits in previous 12 months . Vitals . Screenings to include cognitive, depression, and falls . Referrals and appointments  In addition, I have reviewed and discussed with patient certain preventive protocols, quality metrics, and best practice recommendations. A written personalized care plan for preventive services as well as general preventive health recommendations were provided to patient.     Hayden Pedro, LPN  [id number]

## 2018-06-05 NOTE — Addendum Note (Signed)
Addended by: Hayden Pedro on: 06/05/2018 11:39 AM   Modules accepted: Level of Service, SmartSet

## 2018-06-07 ENCOUNTER — Other Ambulatory Visit: Payer: Self-pay

## 2018-06-07 MED ORDER — HYDROCHLOROTHIAZIDE 25 MG PO TABS: 25.0000 mg | ORAL_TABLET | Freq: Every day | ORAL | 1 refills | Status: DC

## 2018-07-04 ENCOUNTER — Encounter: Payer: Self-pay | Admitting: Gastroenterology

## 2018-07-04 ENCOUNTER — Ambulatory Visit (INDEPENDENT_AMBULATORY_CARE_PROVIDER_SITE_OTHER): Payer: Medicare Other | Admitting: Gastroenterology

## 2018-07-04 VITALS — BP 114/72 | HR 66 | Ht 67.0 in | Wt 178.4 lb

## 2018-07-04 DIAGNOSIS — K582 Mixed irritable bowel syndrome: Secondary | ICD-10-CM

## 2018-07-04 NOTE — Patient Instructions (Signed)
If you are age 70 or older, your body mass index should be between 23-30. Your Body mass index is 27.94 kg/m. If this is out of the aforementioned range listed, please consider follow up with your Primary Care Provider.  If you are age 36 or younger, your body mass index should be between 19-25. Your Body mass index is 27.94 kg/m. If this is out of the aformentioned range listed, please consider follow up with your Primary Care Provider.   Continue Miralax 1 capful daily.   Fiber supplement 1 teaspoon twice daily.  IBgard 1 capsule three times daily.   Follow up as needed.
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
## 2018-08-06 ENCOUNTER — Other Ambulatory Visit: Payer: Self-pay | Admitting: Family Medicine

## 2018-08-06 MED ORDER — CLORAZEPATE DIPOTASSIUM 7.5 MG PO TABS: 7.5000 mg | ORAL_TABLET | Freq: Two times a day (BID) | ORAL | 2 refills | Status: DC | PRN

## 2018-09-07 ENCOUNTER — Other Ambulatory Visit: Payer: Self-pay | Admitting: Family Medicine

## 2018-09-27 ENCOUNTER — Other Ambulatory Visit: Payer: Self-pay | Admitting: Family Medicine

## 2018-10-01 ENCOUNTER — Ambulatory Visit: Payer: Medicare Other | Admitting: Family Medicine

## 2018-10-03 ENCOUNTER — Ambulatory Visit: Payer: Medicare Other | Admitting: Family Medicine

## 2018-10-05 ENCOUNTER — Telehealth: Payer: Self-pay | Admitting: Family Medicine

## 2018-10-05 NOTE — Telephone Encounter (Signed)
Pls call and let pt know , that instead of tranxene, xanax or ativan are recommended for her anxiety if she really does feel that she needs additional medication, an alternative is toi increase the sertraline 50 mg to 1.5 tablets daily, she has an appt on 4/23 and this will be addressed then, but if she is aware before  She is encouraged to start thinking about her options

## 2018-10-11 ENCOUNTER — Ambulatory Visit (INDEPENDENT_AMBULATORY_CARE_PROVIDER_SITE_OTHER): Payer: Medicare Other | Admitting: Family Medicine

## 2018-10-11 ENCOUNTER — Other Ambulatory Visit: Payer: Self-pay

## 2018-10-11 ENCOUNTER — Encounter: Payer: Self-pay | Admitting: Family Medicine

## 2018-10-11 VITALS — BP 114/72 | HR 66 | Ht 67.0 in | Wt 178.0 lb

## 2018-10-11 DIAGNOSIS — F419 Anxiety disorder, unspecified: Secondary | ICD-10-CM

## 2018-10-11 DIAGNOSIS — F418 Other specified anxiety disorders: Secondary | ICD-10-CM | POA: Diagnosis not present

## 2018-10-11 DIAGNOSIS — I10 Essential (primary) hypertension: Secondary | ICD-10-CM

## 2018-10-11 DIAGNOSIS — F5105 Insomnia due to other mental disorder: Secondary | ICD-10-CM

## 2018-10-11 NOTE — Progress Notes (Signed)
Virtual Visit via Telephone Note  I connected with Tiffany Mendoza on 10/11/18 at  3:00 PM EDT by telephone and verified that I am speaking with the correct person using two identifiers.   I discussed the limitations, risks, security and privacy concerns of performing an evaluation and management service by telephone and the availability of in person appointments. I also discussed with the patient that there may be a patient responsible charge related to this service. The patient expressed understanding and agreed to proceed. I am in my home and the patient is  in her  home   History of Present Illness: Denies recent fever or chills. Denies sinus pressure, nasal congestion, ear pain or sore throat. Denies chest congestion, productive cough or wheezing. Denies chest pains, palpitations and leg swelling Denies abdominal pain, nausea, vomiting,diarrhea or constipation.   Denies dysuria, frequency, hesitancy or incontinence. Denies joint pain, swelling and limitation in mobility. Denies headaches, seizures, numbness, or tingling. Still struggling with depression and anxiety , however , seeing some light , especially as she is getting help from a nephew and his family with her Mother, also some grant money to assist in care Hopeful that she will be able to find a place for herself also Denies skin break down or rash.      Observations/Objective: BP 114/72   Pulse 66   Ht 5\' 7"  (1.702 m)   Wt 178 lb (80.7 kg)   BMI 27.88 kg/m    Assessment and Plan: Essential hypertension Controlled, no change in medication DASH diet and commitment to daily physical activity for a minimum of 30 minutes discussed and encouraged, as a part of hypertension management. The importance of attaining a healthy weight is also discussed.  BP/Weight 10/11/2018 07/04/2018 05/23/2018 05/02/2018 05/01/2018 01/11/2018 9/79/8921  Systolic BP 194 174 081 448 185 631 497  Diastolic BP 72 72 87 79 84 78 70  Wt. (Lbs)  178 178.38 178 178.3 177.12 176 176.8  BMI 27.88 27.94 27.88 27.93 27.74 27.57 27.69       Depression with anxiety Marked improvement on current medication , continue same  Insomnia secondary to anxiety Sleep hygiene reviewed , sleep varies a lot with stress level in the home on any given day, depending on Mother's behavior Prescription sent for  medication needed.     Follow Up Instructions:    I discussed the assessment and treatment plan with the patient. The patient was provided an opportunity to ask questions and all were answered. The patient agreed with the plan and demonstrated an understanding of the instructions.   The patient was advised to call back or seek an in-person evaluation if the symptoms worsen or if the condition fails to improve as anticipated.  I provided 22 minutes of non-face-to-face time during this encounter.   Tula Nakayama, MD

## 2018-10-11 NOTE — Patient Instructions (Addendum)
F/U in 6 months, call if you need me  Before  No medication changes at this time  You will get your lab order at your next visit for a November 8 or after lab draw  Thankful that one baby step at a time, which is always the best way, you are experiencing improvement in even the little things  The toolkit I discussed will be e- mailed to you  Social distancing. Frequent hand washing with soap and water Keeping your hands off of your face.Wear a face mask when you leave home These 3 practices will help to keep both you and your community healthy during this time. Please practice them faithfully!  Thanks for choosing [facility], we consider it a privelige to serve you.

## 2018-10-12 ENCOUNTER — Encounter: Payer: Self-pay | Admitting: Family Medicine

## 2018-10-12 NOTE — Assessment & Plan Note (Signed)
Controlled, no change in medication DASH diet and commitment to daily physical activity for a minimum of 30 minutes discussed and encouraged, as a part of hypertension management. The importance of attaining a healthy weight is also discussed.  BP/Weight 10/11/2018 07/04/2018 05/23/2018 05/02/2018 05/01/2018 01/11/2018 6/76/7209  Systolic BP 470 962 836 629 476 546 503  Diastolic BP 72 72 87 79 84 78 70  Wt. (Lbs) 178 178.38 178 178.3 177.12 176 176.8  BMI 27.88 27.94 27.88 27.93 27.74 27.57 27.69

## 2018-10-12 NOTE — Assessment & Plan Note (Addendum)
Sleep hygiene reviewed , sleep varies a lot with stress level in the home on any given day, depending on Mother's behavior Prescription sent for  medication needed.

## 2018-10-12 NOTE — Assessment & Plan Note (Signed)
Marked improvement on current medication , continue same 

## 2018-10-23 NOTE — Telephone Encounter (Signed)
Pt seen on 4/23 for OV

## 2018-10-25 ENCOUNTER — Encounter: Payer: Self-pay | Admitting: *Deleted

## 2018-11-13 ENCOUNTER — Other Ambulatory Visit: Payer: Self-pay

## 2018-11-13 MED ORDER — SERTRALINE HCL 50 MG PO TABS: 50.0000 mg | ORAL_TABLET | Freq: Every day | ORAL | 0 refills | Status: DC

## 2018-12-25 ENCOUNTER — Other Ambulatory Visit: Payer: Self-pay | Admitting: Family Medicine

## 2018-12-25 ENCOUNTER — Telehealth: Payer: Self-pay | Admitting: *Deleted

## 2018-12-25 NOTE — Telephone Encounter (Signed)
Spoke with patient and advised her of recommendations and told her she needs to come in for TDAP vaccine. Verbalized understanding

## 2018-12-25 NOTE — Telephone Encounter (Signed)
Yes her tetanus shot is out dated and with a cut , this is an indication to get one. If keeps cut clean and protected, may ue antibiotic oint like neosporin or any otc antibiotic if appears slightly red to reduce infection risk. If obviously getting infected needs oV and antibiotic by mouth With no complication and early in the process, best recommendation is clean dry and teatnus , TDAP

## 2018-12-25 NOTE — Telephone Encounter (Signed)
Patient does not want to come in due to having to bring her mother out with her. Can she just get a tetanus shot

## 2018-12-25 NOTE — Telephone Encounter (Signed)
Pt cut her finger while she was in the tub on the thing that is metal that you rub your foot with. She is concerned as it has been bleeding and is still somewhat bleeding. She took the band aids off and it quit bleeding but she is concerned as she wanted to know if she needed to get a tetanus shot also. Would like a call back as she wanted to know what to look for as to if this could be a problem

## 2019-01-01 ENCOUNTER — Ambulatory Visit (INDEPENDENT_AMBULATORY_CARE_PROVIDER_SITE_OTHER): Payer: Medicare Other

## 2019-01-01 ENCOUNTER — Other Ambulatory Visit: Payer: Self-pay

## 2019-01-01 DIAGNOSIS — Z23 Encounter for immunization: Secondary | ICD-10-CM

## 2019-01-01 NOTE — Progress Notes (Signed)
Patient received TDAP Vaccine without any complications in the Left Arm

## 2019-01-08 ENCOUNTER — Ambulatory Visit (INDEPENDENT_AMBULATORY_CARE_PROVIDER_SITE_OTHER): Payer: Medicare Other | Admitting: Family Medicine

## 2019-01-08 ENCOUNTER — Other Ambulatory Visit: Payer: Self-pay

## 2019-01-08 VITALS — BP 120/70 | HR 84 | Resp 12 | Ht 67.0 in | Wt 173.0 lb

## 2019-01-08 DIAGNOSIS — Z13220 Encounter for screening for lipoid disorders: Secondary | ICD-10-CM

## 2019-01-08 DIAGNOSIS — F439 Reaction to severe stress, unspecified: Secondary | ICD-10-CM

## 2019-01-08 DIAGNOSIS — I10 Essential (primary) hypertension: Secondary | ICD-10-CM

## 2019-01-08 DIAGNOSIS — F418 Other specified anxiety disorders: Secondary | ICD-10-CM

## 2019-01-08 DIAGNOSIS — E559 Vitamin D deficiency, unspecified: Secondary | ICD-10-CM

## 2019-01-08 DIAGNOSIS — E663 Overweight: Secondary | ICD-10-CM

## 2019-01-08 NOTE — Progress Notes (Signed)
Tiffany Mendoza     MRN: [id number]      DOB: 1950/07/29   HPI Tiffany Mendoza is here for follow up and re-evaluation of chronic medical conditions, medication management and review of any available recent lab and radiology data.  Preventive health is updated, specifically  Cancer screening and Immunization.   The reason for her visit is because of increased stress caring for her mother with severe dementia with little support from HER-2 siblings.  She states that they actually have no air conditioning and she has to go out in the car sitting in parking lots during the middle of the day for relief.  Tiffany Mendoza is at the breaking point in terms of caring for her mother and is looking to see if there is any way she can have some sort of relief with her mother's care as she tries to navigate getting her into an senior living facility.  This is very valid as Tiffany Mendoza is totally dependent on care and incapable of taking care of herself and their current current living conditions are not safe based on lack of cooling in their home. ROS Denies recent fever or chills. Denies sinus pressure, nasal congestion, ear pain or sore throat. Denies chest congestion, productive cough or wheezing. Denies chest pains, palpitations and leg swelling Denies abdominal pain, nausea, vomiting,diarrhea or constipation.   Denies dysuria, frequency, hesitancy or incontinence. Denies joint pain, swelling and limitation in mobility. Denies headaches, seizures, numbness, or tingling. C/o  Depression  And  anxiety denies insomnia. Denies skin break down or rash.   PE  BP 120/70   Pulse 84   Resp 12   Ht _0  (1.702 m)   Wt 173 lb (78.5 kg)   SpO2 96%   BMI 27.10 kg/m   Patient alert and oriented and in no cardiopulmonary distress.  HEENT: No facial asymmetry, EOMI,   oropharynx pink and moist.  Neck supple no JVD, no mass.  Chest: Clear to auscultation bilaterally.  CVS: S1, S2 no murmurs, no S3.Regular  rate.  ABD: Soft non tender.   Ext: No edema  MS: Adequate ROM spine, shoulders, hips and knees.  Skin: Intact, no ulcerations or rash noted.  Psych: Good eye contact,anxious. Memory intact   CNS: CN 2-12 intact, power,  normal throughout.no focal deficits noted.   Assessment & Plan  Stress at home stres primarily due to the burden of caring for her Mother ho has dementia with little support/ inadequate support from her siblings. Economically challenged, no AC, and temperature is in the triple digits at times, gets into car for relief, requests respite help for Mom, and she does need help  Essential hypertension Controlled, no change in medication DASH diet and commitment to daily physical activity for a minimum of 30 minutes discussed and encouraged, as a part of hypertension management. The importance of attaining a healthy weight is also discussed.  BP/Weight 01/08/2019 10/11/2018 07/04/2018 05/23/2018 05/02/2018 05/01/2018 8/76/8115  Systolic BP 726 203 559 741 638 453 646  Diastolic BP 70 72 72 87 79 84 78  Wt. (Lbs) 173 178 178.38 178 178.3 177.12 176  BMI 27.1 27.88 27.94 27.88 27.93 27.74 27.57       Overweight (BMI 25.0-29.9)  Patient re-educated about  the importance of commitment to a  minimum of 150 minutes of exercise per week as able.  The importance of healthy food choices with portion control discussed, as well as eating regularly and within a 12  hour window most days. The need to choose "clean , green" food 50 to 75% of the time is discussed, as well as to make water the primary drink and set a goal of 64 ounces water daily.    Weight /BMI 01/08/2019 10/11/2018 07/04/2018  WEIGHT 173 lb 178 lb 178 lb 6 oz  HEIGHT _0  _1  _2   BMI 27.1 kg/m2 27.88 kg/m2 27.94 kg/m2      Depression with anxiety Uncontrolled, however  States has sufficient support form community resources which include therapy

## 2019-01-08 NOTE — Patient Instructions (Signed)
F/u with MD in 8 to 10 weeks, call if you need me before  Your depression and anxiety are uncontrolled , I do recommend increasing the dose of sertraline as well  As very importantly getting relief with your Mom's care for  Even 1 to 2 weeks, we will check on respite care and let you know by end of week  Fasting  CBC, lipid, cmp and EGFR , TSH and vit D  This week please  Thanks for choosing [facility], we consider it a privelige to serve you.

## 2019-01-15 ENCOUNTER — Encounter: Payer: Self-pay | Admitting: Family Medicine

## 2019-01-15 DIAGNOSIS — F439 Reaction to severe stress, unspecified: Secondary | ICD-10-CM | POA: Insufficient documentation

## 2019-01-15 NOTE — Assessment & Plan Note (Signed)
  Patient re-educated about  the importance of commitment to a  minimum of 150 minutes of exercise per week as able.  The importance of healthy food choices with portion control discussed, as well as eating regularly and within a 12 hour window most days. The need to choose "clean , green" food 50 to 75% of the time is discussed, as well as to make water the primary drink and set a goal of 64 ounces water daily.    Weight /BMI 01/08/2019 10/11/2018 07/04/2018  WEIGHT 173 lb 178 lb 178 lb 6 oz  HEIGHT 5\' 7"  5\' 7"  5\' 7"   BMI 27.1 kg/m2 27.88 kg/m2 27.94 kg/m2

## 2019-01-15 NOTE — Assessment & Plan Note (Signed)
Uncontrolled, however  States has sufficient support form community resources which include therapy

## 2019-01-15 NOTE — Assessment & Plan Note (Addendum)
stres primarily due to the burden of caring for her Mother ho has dementia with little support/ inadequate support from her siblings. Economically challenged, no AC, and temperature is in the triple digits at times, gets into car for relief, requests respite help for Mom, and she does need help

## 2019-01-15 NOTE — Assessment & Plan Note (Signed)
Controlled, no change in medication DASH diet and commitment to daily physical activity for a minimum of 30 minutes discussed and encouraged, as a part of hypertension management. The importance of attaining a healthy weight is also discussed.  BP/Weight 01/08/2019 10/11/2018 07/04/2018 05/23/2018 05/02/2018 05/01/2018 6/96/7893  Systolic BP 810 175 102 585 277 824 235  Diastolic BP 70 72 72 87 79 84 78  Wt. (Lbs) 173 178 178.38 178 178.3 177.12 176  BMI 27.1 27.88 27.94 27.88 27.93 27.74 27.57

## 2019-01-17 ENCOUNTER — Telehealth: Payer: Self-pay | Admitting: Family Medicine

## 2019-01-17 NOTE — Telephone Encounter (Signed)
Does she want her mom in respite care at hospice house still,  Yes I am  Happy to hear!

## 2019-01-17 NOTE — Telephone Encounter (Signed)
Pt called and wanted to let you know that they finally have their air conditioner working again. She said that you would be happy to hear that!

## 2019-01-21 NOTE — Telephone Encounter (Signed)
Wants more info on respite care before she makes a decision. Will mail to her and she is aware

## 2019-01-28 ENCOUNTER — Other Ambulatory Visit: Payer: Self-pay | Admitting: Family Medicine

## 2019-01-29 ENCOUNTER — Other Ambulatory Visit: Payer: Self-pay | Admitting: Family Medicine

## 2019-01-29 MED ORDER — CLORAZEPATE DIPOTASSIUM 7.5 MG PO TABS: 7.5000 mg | ORAL_TABLET | Freq: Two times a day (BID) | ORAL | 4 refills | Status: DC | PRN

## 2019-02-06 ENCOUNTER — Other Ambulatory Visit: Payer: Self-pay | Admitting: Family Medicine

## 2019-02-07 ENCOUNTER — Encounter: Payer: Self-pay | Admitting: Family Medicine

## 2019-02-07 LAB — TEST AUTHORIZATION

## 2019-02-07 LAB — COMPLETE METABOLIC PANEL WITH GFR
AG Ratio: 1.5 (calc) (ref 1.0–2.5)
ALT: 16 U/L (ref 6–29)
AST: 20 U/L (ref 10–35)
Albumin: 4.3 g/dL (ref 3.6–5.1)
Alkaline phosphatase (APISO): 45 U/L (ref 37–153)
BUN: 11 mg/dL (ref 7–25)
CO2: 29 mmol/L (ref 20–32)
Calcium: 9.7 mg/dL (ref 8.6–10.4)
Chloride: 105 mmol/L (ref 98–110)
Creat: 0.81 mg/dL (ref 0.60–0.93)
GFR, Est African American: 85 mL/min/{1.73_m2} (ref >=60)
GFR, Est Non African American: 74 mL/min/{1.73_m2} (ref >=60)
Globulin: 2.9 g/dL (calc) (ref 1.9–3.7)
Glucose, Bld: 110 mg/dL — ABNORMAL HIGH (ref 65–99)
Potassium: 3.8 mmol/L (ref 3.5–5.3)
Sodium: 142 mmol/L (ref 135–146)
Total Bilirubin: 0.6 mg/dL (ref 0.2–1.2)
Total Protein: 7.2 g/dL (ref 6.1–8.1)

## 2019-02-07 LAB — CBC
HCT: 39.5 % (ref 35.0–45.0)
Hemoglobin: 13.3 g/dL (ref 11.7–15.5)
MCH: 31 pg (ref 27.0–33.0)
MCHC: 33.7 g/dL (ref 32.0–36.0)
MCV: 92.1 fL (ref 80.0–100.0)
MPV: 10.7 fL (ref 7.5–12.5)
Platelets: 240 10*3/uL (ref 140–400)
RBC: 4.29 10*6/uL (ref 3.80–5.10)
RDW: 12.5 % (ref 11.0–15.0)
WBC: 5.2 10*3/uL (ref 3.8–10.8)

## 2019-02-07 LAB — LIPID PANEL
Cholesterol: 150 mg/dL (ref <200)
HDL: 55 mg/dL (ref >=50)
LDL Cholesterol (Calc): 81 mg/dL (calc)
Non-HDL Cholesterol (Calc): 95 mg/dL (calc) (ref <130)
Total CHOL/HDL Ratio: 2.7 (calc) (ref <5.0)
Triglycerides: 61 mg/dL (ref <150)

## 2019-02-07 LAB — TSH: TSH: 3.16 mIU/L (ref 0.40–4.50)

## 2019-02-07 LAB — VITAMIN D 25 HYDROXY (VIT D DEFICIENCY, FRACTURES): Vit D, 25-Hydroxy: 45 ng/mL (ref 30–100)

## 2019-02-07 LAB — HEMOGLOBIN A1C W/OUT EAG: Hgb A1c MFr Bld: 5.7 % of total Hgb — ABNORMAL HIGH (ref <5.7)

## 2019-02-26 ENCOUNTER — Ambulatory Visit: Payer: Medicare Other | Admitting: Family Medicine

## 2019-03-13 ENCOUNTER — Ambulatory Visit: Payer: Medicare Other | Admitting: Family Medicine

## 2019-03-14 ENCOUNTER — Ambulatory Visit: Payer: Medicare Other | Admitting: Family Medicine

## 2019-03-15 ENCOUNTER — Telehealth: Payer: Self-pay | Admitting: Family Medicine

## 2019-03-15 NOTE — Telephone Encounter (Signed)
Called patient to discuss NP's advice. No answer. Left generic message requesting call back.

## 2019-03-15 NOTE — Telephone Encounter (Signed)
Pt states she feels like she has a pinched nerve, and she is not sure of the date in which you treated her. She is requesting something to take, she cant remember what you gave her

## 2019-03-18 NOTE — Telephone Encounter (Signed)
Called patient to discuss her message however she stated she would have to call back because she had to take her mother to the doctor in [employer].

## 2019-03-21 NOTE — Telephone Encounter (Signed)
Spoke with patient and she stated it is getting better. She took some ibuprofen and she thinks that helped. It may have been some inflammation.

## 2019-04-02 ENCOUNTER — Encounter: Payer: Self-pay | Admitting: Family Medicine

## 2019-04-02 ENCOUNTER — Ambulatory Visit (INDEPENDENT_AMBULATORY_CARE_PROVIDER_SITE_OTHER): Payer: Medicare Other | Admitting: Family Medicine

## 2019-04-02 ENCOUNTER — Other Ambulatory Visit: Payer: Self-pay

## 2019-04-02 VITALS — BP 114/80 | HR 80 | Resp 16 | Ht 67.0 in | Wt 177.0 lb

## 2019-04-02 DIAGNOSIS — Z23 Encounter for immunization: Secondary | ICD-10-CM

## 2019-04-02 DIAGNOSIS — I10 Essential (primary) hypertension: Secondary | ICD-10-CM

## 2019-04-02 DIAGNOSIS — F419 Anxiety disorder, unspecified: Secondary | ICD-10-CM | POA: Diagnosis not present

## 2019-04-02 DIAGNOSIS — F322 Major depressive disorder, single episode, severe without psychotic features: Secondary | ICD-10-CM | POA: Diagnosis not present

## 2019-04-02 DIAGNOSIS — Z1231 Encounter for screening mammogram for malignant neoplasm of breast: Secondary | ICD-10-CM | POA: Diagnosis not present

## 2019-04-02 MED ORDER — SERTRALINE HCL 50 MG PO TABS: ORAL_TABLET | ORAL | 3 refills | Status: DC

## 2019-04-02 NOTE — Progress Notes (Signed)
   Tiffany Mendoza     MRN: [id number]      DOB: March 24, 1951   HPI Tiffany Mendoza is here for follow up and re-evaluation of chronic medical conditions, medication management and review of any available recent lab and radiology data.  Preventive health is updated, specifically  Cancer screening and Immunization.   Overwhelmed with caring for her Mother states still having problems  With support. Reports had no air conditioning  In the Summer,had to call   clerk of courts following which her sister followed through with the pre arranged financial assistance Gets counseling and states she now realizes she herself needs more not just because of current situation , but also her personal history.  Not suicidal or  homicidalROS Denies recent fever or chills. Denies sinus pressure, nasal congestion, ear pain or sore throat. Denies chest congestion, productive cough or wheezing. Denies chest pains, palpitations and leg swelling Denies abdominal pain, nausea, vomiting,diarrhea or constipation.   Denies dysuria, frequency, hesitancy or incontinence. Denies joint pain, swelling and limitation in mobility. Denies headaches, seizures, numbness, or tingling.  Denies skin break down or rash.   PE  BP 114/80   Pulse 80   Resp 16   Ht 5\' 7"  (1.702 m)   Wt 177 lb (80.3 kg)   SpO2 97%   BMI 27.72 kg/m   Patient alert and oriented and in no cardiopulmonary distress.  HEENT: No facial asymmetry, EOMI,     Neck supple .  Chest: Clear to auscultation bilaterally.  CVS: S1, S2 no murmurs, no S3.Regular rate.  Ext: No edema  MS: Adequate ROM spine, shoulders, hips and knees.  Skin: Intact, no ulcerations or rash noted.  Psych: Good eye contact, flat  affect. Memory intact depressed appearing.  CNS: CN 2-12 intact, power,  Grossly  normal throughout.no focal deficits noted.   Assessment & Plan  Essential hypertension Controlled, no change in medication DASH diet and commitment to daily  physical activity for a minimum of 30 minutes discussed and encouraged, as a part of hypertension management. The importance of attaining a healthy weight is also discussed.  BP/Weight 04/02/2019 01/08/2019 10/11/2018 07/04/2018 05/23/2018 05/02/2018 [id number]  Systolic BP 99991111 123456 99991111 99991111 [id number]  Diastolic BP 80 70 72 72 87 79 84  Wt. (Lbs) 177 173 178 178.38 178 178.3 177.12  BMI 27.72 27.1 27.88 27.94 27.88 27.93 27.74       Depression, major, single episode, severe (HCC) Inadequately treated , importance of compliance is stressed. Dose increase to 1.5 tabs daily. Encouraged more intense therapy as she currently receives, states she also realizes her need for this and is making arrangements for this. Not suicidal or homicidal. Current living situation, having almost the sole responsibility for the care of her Mother with dementia is a major contributing factor. She continues to work on any help she can get to relieve this situation F/u in 8 to 10 weeks  Anxiety Controlled on current medication, continue same

## 2019-04-02 NOTE — Patient Instructions (Addendum)
F/U with MD in January, call if you need me before  Increase dose of zoloft to one and a half tablets once daily, and please take the medication EVERY day  Labs in August are excellent , jus watch sugar and carbs  Flu vaccine today  Please DO continue to get the support you can from your therapist, and also from the dementia support group, and any other support available for you in the community

## 2019-04-03 ENCOUNTER — Encounter: Payer: Self-pay | Admitting: Family Medicine

## 2019-04-06 DIAGNOSIS — F322 Major depressive disorder, single episode, severe without psychotic features: Secondary | ICD-10-CM | POA: Insufficient documentation

## 2019-04-06 DIAGNOSIS — F419 Anxiety disorder, unspecified: Secondary | ICD-10-CM | POA: Insufficient documentation

## 2019-04-06 NOTE — Assessment & Plan Note (Signed)
Inadequately treated , importance of compliance is stressed. Dose increase to 1.5 tabs daily. Encouraged more intense therapy as she currently receives, states she also realizes her need for this and is making arrangements for this. Not suicidal or homicidal. Current living situation, having almost the sole responsibility for the care of her Mother with dementia is a major contributing factor. She continues to work on any help she can get to relieve this situation F/u in 8 to 10 weeks

## 2019-04-06 NOTE — Assessment & Plan Note (Signed)
Controlled on current medication, continue same 

## 2019-04-06 NOTE — Assessment & Plan Note (Signed)
Controlled, no change in medication DASH diet and commitment to daily physical activity for a minimum of 30 minutes discussed and encouraged, as a part of hypertension management. The importance of attaining a healthy weight is also discussed.  BP/Weight 04/02/2019 01/08/2019 10/11/2018 07/04/2018 05/23/2018 05/02/2018 [id number]  Systolic BP 99991111 123456 99991111 99991111 [id number]  Diastolic BP 80 70 72 72 87 79 84  Wt. (Lbs) 177 173 178 178.38 178 178.3 177.12  BMI 27.72 27.1 27.88 27.94 27.88 27.93 27.74

## 2019-04-16 ENCOUNTER — Ambulatory Visit: Payer: Medicare Other | Admitting: Family Medicine

## 2019-05-01 ENCOUNTER — Inpatient Hospital Stay (HOSPITAL_COMMUNITY): Payer: Medicare Other

## 2019-05-01 ENCOUNTER — Other Ambulatory Visit (HOSPITAL_COMMUNITY): Payer: Medicare Other

## 2019-05-08 ENCOUNTER — Inpatient Hospital Stay (HOSPITAL_COMMUNITY): Payer: Medicare Other | Attending: Hematology

## 2019-05-08 ENCOUNTER — Other Ambulatory Visit: Payer: Self-pay

## 2019-05-08 ENCOUNTER — Ambulatory Visit (HOSPITAL_COMMUNITY): Payer: Medicare Other | Admitting: Hematology

## 2019-05-08 DIAGNOSIS — R9389 Abnormal findings on diagnostic imaging of other specified body structures: Secondary | ICD-10-CM | POA: Diagnosis not present

## 2019-05-08 LAB — CBC WITH DIFFERENTIAL/PLATELET
Abs Immature Granulocytes: 0.01 10*3/uL (ref 0.00–0.07)
Basophils Absolute: 0 10*3/uL (ref 0.0–0.1)
Basophils Relative: 1 %
Eosinophils Absolute: 0.2 10*3/uL (ref 0.0–0.5)
Eosinophils Relative: 3 %
HCT: 40.6 % (ref 36.0–46.0)
Hemoglobin: 13.2 g/dL (ref 12.0–15.0)
Immature Granulocytes: 0 %
Lymphocytes Relative: 38 %
Lymphs Abs: 2.2 10*3/uL (ref 0.7–4.0)
MCH: 31.3 pg (ref 26.0–34.0)
MCHC: 32.5 g/dL (ref 30.0–36.0)
MCV: 96.2 fL (ref 80.0–100.0)
Monocytes Absolute: 0.5 10*3/uL (ref 0.1–1.0)
Monocytes Relative: 8 %
Neutro Abs: 2.9 10*3/uL (ref 1.7–7.7)
Neutrophils Relative %: 50 %
Platelets: 229 10*3/uL (ref 150–400)
RBC: 4.22 MIL/uL (ref 3.87–5.11)
RDW: 12.2 % (ref 11.5–15.5)
WBC: 5.8 10*3/uL (ref 4.0–10.5)
nRBC: 0 % (ref 0.0–0.2)

## 2019-05-08 LAB — COMPREHENSIVE METABOLIC PANEL
ALT: 21 U/L (ref 0–44)
AST: 20 U/L (ref 15–41)
Albumin: 4 g/dL (ref 3.5–5.0)
Alkaline Phosphatase: 47 U/L (ref 38–126)
Anion gap: 7 (ref 5–15)
BUN: 10 mg/dL (ref 8–23)
CO2: 30 mmol/L (ref 22–32)
Calcium: 9.4 mg/dL (ref 8.9–10.3)
Chloride: 103 mmol/L (ref 98–111)
Creatinine, Ser: 0.8 mg/dL (ref 0.44–1.00)
GFR calc Af Amer: >60 mL/min (ref >60)
GFR calc non Af Amer: >60 mL/min (ref >60)
Glucose, Bld: 86 mg/dL (ref 70–99)
Potassium: 3.9 mmol/L (ref 3.5–5.1)
Sodium: 140 mmol/L (ref 135–145)
Total Bilirubin: 0.8 mg/dL (ref 0.3–1.2)
Total Protein: 7.7 g/dL (ref 6.5–8.1)

## 2019-05-08 LAB — LACTATE DEHYDROGENASE: LDH: 143 U/L (ref 98–192)

## 2019-05-09 LAB — PROTEIN ELECTROPHORESIS, SERUM
A/G Ratio: 1.2 (ref 0.7–1.7)
Albumin ELP: 3.7 g/dL (ref 2.9–4.4)
Alpha-1-Globulin: 0.3 g/dL (ref 0.0–0.4)
Alpha-2-Globulin: 0.7 g/dL (ref 0.4–1.0)
Beta Globulin: 0.9 g/dL (ref 0.7–1.3)
Gamma Globulin: 1.2 g/dL (ref 0.4–1.8)
Globulin, Total: 3.2 g/dL (ref 2.2–3.9)
Total Protein ELP: 6.9 g/dL (ref 6.0–8.5)

## 2019-05-09 LAB — IGG, IGA, IGM
IgA: 174 mg/dL (ref 87–352)
IgG (Immunoglobin G), Serum: 1336 mg/dL (ref 586–1602)
IgM (Immunoglobulin M), Srm: 41 mg/dL (ref 26–217)

## 2019-05-09 LAB — KAPPA/LAMBDA LIGHT CHAINS
Kappa free light chain: 22.6 mg/L — ABNORMAL HIGH (ref 3.3–19.4)
Kappa, lambda light chain ratio: 1.77 — ABNORMAL HIGH (ref 0.26–1.65)
Lambda free light chains: 12.8 mg/L (ref 5.7–26.3)

## 2019-05-15 ENCOUNTER — Other Ambulatory Visit: Payer: Self-pay

## 2019-05-15 ENCOUNTER — Inpatient Hospital Stay (HOSPITAL_BASED_OUTPATIENT_CLINIC_OR_DEPARTMENT_OTHER): Payer: Medicare Other | Admitting: Hematology

## 2019-05-15 ENCOUNTER — Encounter (HOSPITAL_COMMUNITY): Payer: Self-pay | Admitting: Hematology

## 2019-05-15 DIAGNOSIS — R768 Other specified abnormal immunological findings in serum: Secondary | ICD-10-CM | POA: Diagnosis not present

## 2019-05-15 NOTE — Progress Notes (Signed)
Virtual Visit via Telephone Note  I connected with Tiffany Mendoza on 05/15/19 at  3:30 PM EST by telephone and verified that I am speaking with the correct person using two identifiers.   I discussed the limitations, risks, security and privacy concerns of performing an evaluation and management service by telephone and the availability of in person appointments. I also discussed with the patient that there may be a patient responsible charge related to this service. The patient expressed understanding and agreed to proceed.   History of Present Illness: She was being followed in our clinic for abnormal light chain ratio and areas of lucencies in bilateral humeral heads.  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   Observations/Objective: She denies any new onset pains.  No bone pains reported.  She is having some emotional stress taking care of her mother with dementia.  Denies any fevers, night sweats or weight loss.  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  Denies any recurrent infections or hospitalizations.  Assessment and Plan:  1.  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 -I reviewed her blood work from 05/08/2019.  Kappa light chains at 22.6 and ratio is 1.77.  CBC was normal.  SPEP was negative.  Creatinine was 0.8 and calcium 9.4. -Last skeletal survey on 10/20/2016 showed no new or enlarging suspicious lytic lesions.  Questionable subtle very small lucency in the proximal left humerus is stable. -Given these findings, I have recommended follow-up visit in 1 year with repeat myeloma panel.  I will also repeat a skeletal survey.   Follow Up Instructions: RTC 1 year with labs and x-rays.   I discussed the assessment and treatment plan with the patient. The patient  was provided an opportunity to ask questions and all were answered. The patient agreed with the plan and demonstrated an understanding of the instructions.   The patient was advised to call back or seek an in-person evaluation if the symptoms worsen or if the condition fails to improve as anticipated.  I provided 12 minutes of non-face-to-face time during this encounter.   Derek Jack, MD

## 2019-05-26 ENCOUNTER — Other Ambulatory Visit: Payer: Self-pay | Admitting: Family Medicine

## 2019-05-27 ENCOUNTER — Ambulatory Visit (INDEPENDENT_AMBULATORY_CARE_PROVIDER_SITE_OTHER): Payer: Medicare Other | Admitting: Family Medicine

## 2019-05-27 ENCOUNTER — Encounter: Payer: Self-pay | Admitting: Family Medicine

## 2019-05-27 ENCOUNTER — Other Ambulatory Visit: Payer: Self-pay

## 2019-05-27 ENCOUNTER — Ambulatory Visit: Payer: Medicare Other

## 2019-05-27 VITALS — BP 114/80 | Ht 67.0 in | Wt 177.0 lb

## 2019-05-27 DIAGNOSIS — Z0000 Encounter for general adult medical examination without abnormal findings: Secondary | ICD-10-CM

## 2019-05-27 NOTE — Progress Notes (Signed)
Subjective:   Tiffany Mendoza is a 68 y.o. female who presents for Medicare Annual (Subsequent) preventive examination.  Location of Patient: Home Location of Provider: Telehealth Consent was obtain for visit to be over via telehealth.  I verified that I am speaking with the correct person using two identifiers.  Review of Systems:   Cardiac Risk Factors include: advanced age (>10men, >3 women);hypertension;sedentary lifestyle     Objective:     Vitals: Ht 5\' 7"  (1.702 m)   Wt 177 lb (80.3 kg)   BMI 27.72 kg/m   Body mass index is 27.72 kg/m.  Advanced Directives 05/27/2019 05/15/2019 05/02/2018 10/20/2016 08/31/2016 04/22/2016 10/21/2015  Does Patient Have a Medical Advance Directive? Yes No Yes Yes No;Yes Yes Yes  Type of Advance Directive - - Living will;Healthcare Power of [place];Living will [place];Living will [place];Living will [place];Living will  Does patient want to make changes to medical advance directive? - - - - No - Patient declined No - Patient declined -  Copy of Healthcare Power of Attorney in Chart? - - No - copy requested No - copy requested No - copy requested No - copy requested No - copy requested  Would patient like information on creating a medical advance directive? - No - Patient declined - - - - -    Tobacco Social History   Tobacco Use  Smoking Status Never Smoker  Smokeless Tobacco Never Used     Counseling given: Not Answered   Clinical Intake:  Pre-visit preparation completed: No  Pain : No/denies pain Pain Score: 0-No pain     Nutritional Status: BMI 25 -29 Overweight Nutritional Risks: None Diabetes: No  How often do you need to have someone help you when you read instructions, pamphlets, or other written materials from your doctor or pharmacy?: 1 - Never What is the last grade level you completed in school?: 16  Interpreter Needed?:  No     Past Medical History:  Diagnosis Date  . Adjustment disorder with mixed anxiety and depressed mood   . Allergy   . Carotid bruit   . Depression   . Dermatophytosis of foot   . GERD (gastroesophageal reflux disease)   . Hypertension   . Insomnia   . Neck pain   . Osteoporosis    Past Surgical History:  Procedure Laterality Date  . ABDOMINAL HYSTERECTOMY  1988  . HEMORRHOID SURGERY    . myoectomy  1984   Family History  Problem Relation Age of Onset  . Colon polyps Father   . Prostate cancer Father   . ALS Father   . Thyroid disease Sister   . Thyroid nodules Sister        Thyroidectomy in 1984  . Memory loss Mother   . Fibroids Sister   . Hypertension Sister   . Liver cancer Paternal Grandmother    Social History   Socioeconomic History  . Marital status: Single    Spouse name: Not on file  . Number of children: Not on file  . Years of education: 12+  . Highest education level: 12th grade  Occupational History  . Not on file  Social Needs  . Financial resource strain: Not hard at all  . Food insecurity    Worry: Never true    Inability: Never true  . Transportation needs    Medical: No    Non-medical: No  Tobacco Use  .  Smoking status: Never Smoker  . Smokeless tobacco: Never Used  Substance and Sexual Activity  . Alcohol use: Yes    Comment: occasional wine  . Drug use: No  . Sexual activity: Not Currently  Lifestyle  . Physical activity    Days per week: 0 days    Minutes per session: 0 min  . Stress: Very much  Relationships  . Social connections    Talks on phone: More than three times a week    Gets together: Once a week    Attends religious service: Never    Active member of club or organization: No    Attends meetings of clubs or organizations: Never    Relationship status: Divorced  Other Topics Concern  . Not on file  Social History Narrative   Lives alone with Mother who has dementia     Outpatient Encounter Medications  as of 05/27/2019  Medication Sig  . Ascorbic Acid (VITAMIN C) 1000 MG tablet Take 1,000 mg by mouth daily as needed (supplement).  . Cholecalciferol (VITAMIN D3 PO) Take 5,000 Units by mouth daily.  . clorazepate (TRANXENE) 7.5 MG tablet Take 1 tablet (7.5 mg total) by mouth 2 (two) times daily as needed for anxiety. (Patient not taking: Reported on 05/15/2019)  . hydrochlorothiazide (HYDRODIURIL) 25 MG tablet Take 1 tablet by mouth once daily  . Polyethylene Glycol 3350 (MIRALAX PO) Take by mouth daily as needed.   . Potassium 99 MG TABS Take by mouth daily.    . PSYLLIUM HUSK PO Take by mouth. 2 capsules daily  . sertraline (ZOLOFT) 50 MG tablet Take one and a half tablets once daily for depression  . TURMERIC PO Take 1 Dose by mouth daily as needed (supplement).  . Zinc 50 MG TABS Take 50 mg by mouth daily as needed (supplement).   No facility-administered encounter medications on file as of 05/27/2019.     Activities of Daily Living In your present state of health, do you have any difficulty performing the following activities: 05/27/2019  Hearing? N  Vision? N  Difficulty concentrating or making decisions? N  Walking or climbing stairs? N  Dressing or bathing? N  Doing errands, shopping? N  Some recent data might be hidden    Patient Care Team: Fayrene Helper, MD as PCP - General Maisie Fus, MD as Consulting Physician (Obstetrics and Gynecology) Rutherford Guys, MD as Consulting Physician (Ophthalmology)    Assessment:   This is a routine wellness examination for Tiffany Mendoza.  Exercise Activities and Dietary recommendations Current Exercise Habits: The patient does not participate in regular exercise at present, Exercise limited by: None identified  Goals    . Find place of her own (pt-stated)     She would like to get out and find a place of her own.    . Patient Stated     Start going to therapy        Fall Risk Fall Risk  05/27/2019 04/02/2019 01/08/2019  05/23/2018 05/01/2018  Falls in the past year? 0 0 0 0 0  Number falls in past yr: 0 0 - - 0  Injury with Fall? 0 0 0 - 0  Follow up Falls evaluation completed;Education provided;Falls prevention discussed - - - -   Is the patient's home free of loose throw rugs in walkways, pet beds, electrical cords, etc?   yes      Grab bars in the bathroom? yes      Handrails on the stairs?  yes      Adequate lighting?   yes  Timed Get Up and Go performed: n/a  Depression Screen PHQ 2/9 Scores 05/27/2019 01/08/2019 01/08/2019 05/23/2018  PHQ - 2 Score 4 6 0 5  PHQ- 9 Score 14 18 - 12     Cognitive Function     6CIT Screen 05/27/2019 05/23/2018 08/31/2016  What Year? 0 points 0 points 0 points  What month? 0 points 0 points 0 points  What time? 0 points 0 points 0 points  Count back from 20 0 points 0 points 0 points  Months in reverse 0 points 0 points 0 points  Repeat phrase 0 points 0 points 0 points  Total Score 0 0 0    Immunization History  Administered Date(s) Administered  . Fluad Quad(high Dose 65+) 04/02/2019  . Influenza Split 04/18/2011, 08/09/2012  . Influenza Whole 04/29/2008, 04/15/2010  . Influenza, High Dose Seasonal PF 04/26/2018  . Influenza,inj,Quad PF,6+ Mos 03/18/2014, 04/28/2015, 05/18/2016, 05/24/2017  . PPD Test 08/09/2012  . Pneumococcal Conjugate-13 08/14/2015  . Pneumococcal Polysaccharide-23 09/17/2013  . Td 08/14/2007  . Tdap 01/01/2019  . Zoster 10/01/2014    Qualifies for Shingles Vaccine? completed  Screening Tests Health Maintenance  Topic Date Due  . MAMMOGRAM  05/30/2020  . COLONOSCOPY  06/16/2027  . TETANUS/TDAP  12/31/2028  . INFLUENZA VACCINE  Completed  . DEXA SCAN  Completed  . Hepatitis C Screening  Completed  . PNA vac Low Risk Adult  Completed    Cancer Screenings: Lung: Low Dose CT Chest recommended if Age 18-80 years, 30 pack-year currently smoking OR have quit w/in 15years. Patient does not qualify. Breast:  Up to date on  Mammogram? Yes   Up to date of Bone Density/Dexa? Yes Colorectal: Due 2028  Additional Screenings: : Hepatitis C Screening: completed     Plan:      1. Encounter for Medicare annual wellness exam   I have personally reviewed and noted the following in the patient's chart:   . Medical and social history . Use of alcohol, tobacco or illicit drugs  . Current medications and supplements . Functional ability and status . Nutritional status . Physical activity . Advanced directives . List of other physicians . Hospitalizations, surgeries, and ER visits in previous 12 months . Vitals . Screenings to include cognitive, depression, and falls . Referrals and appointments  In addition, I have reviewed and discussed with patient certain preventive protocols, quality metrics, and best practice recommendations. A written personalized care plan for preventive services as well as general preventive health recommendations were provided to patient.     I provided 20 minutes of non-face-to-face time during this encounter.   Perlie Mayo, NP  05/27/2019

## 2019-05-27 NOTE — Patient Instructions (Addendum)
Tiffany Mendoza , Thank you for taking time to come for your Medicare Wellness Visit. I appreciate your ongoing commitment to your health goals. Please review the following plan we discussed and let me know if I can assist you in the future.   Please continue to practice social distancing to keep you, your family, and our community safe. If you must go out, please wear a Mask and practice good handwashing.  We hope you have a safe, happy, and healthy Holiday Season! See you in the New Year :)  Screening recommendations/referrals: Colonoscopy: Due 2028 Mammogram: Up to date Bone Density:  Up to date Recommended yearly ophthalmology/optometry visit for glaucoma screening and checkup Recommended yearly dental visit for hygiene and checkup  Vaccinations: Influenza vaccine: Completed Pneumococcal vaccine: Completed Tdap vaccine: Up to date Shingles vaccine: Completed  Advanced directives: Let us know if you need assistance with this  Conditions/risks identified: Falls, Caregiver Role Strain  Next appointment: 07/04/2019   Preventive Care 2 Years and Older, Female Preventive care refers to lifestyle choices and visits with your health care provider that can promote health and wellness. What does preventive care include?  A yearly physical exam. This is also called an annual well check.  Dental exams once or twice a year.  Routine eye exams. Ask your health care provider how often you should have your eyes checked.  Personal lifestyle choices, including:  Daily care of your teeth and gums.  Regular physical activity.  Eating a healthy diet.  Avoiding tobacco and drug use.  Limiting alcohol use.  Practicing safe sex.  Taking low-dose aspirin every day.  Taking vitamin and mineral supplements as recommended by your health care provider. What happens during an annual well check? The services and screenings done by your health care provider during your annual well check will  depend on your age, overall health, lifestyle risk factors, and family history of disease. Counseling  Your health care provider may ask you questions about your:  Alcohol use.  Tobacco use.  Drug use.  Emotional well-being.  Home and relationship well-being.  Sexual activity.  Eating habits.  History of falls.  Memory and ability to understand (cognition).  Work and work Statistician.  Reproductive health. Screening  You may have the following tests or measurements:  Height, weight, and BMI.  Blood pressure.  Lipid and cholesterol levels. These may be checked every 5 years, or more frequently if you are over 37 years old.  Skin check.  Lung cancer screening. You may have this screening every year starting at age 62 if you have a 30-pack-year history of smoking and currently smoke or have quit within the past 15 years.  Fecal occult blood test (FOBT) of the stool. You may have this test every year starting at age 45.  Flexible sigmoidoscopy or colonoscopy. You may have a sigmoidoscopy every 5 years or a colonoscopy every 10 years starting at age 22.  Hepatitis C blood test.  Hepatitis B blood test.  Sexually transmitted disease (STD) testing.  Diabetes screening. This is done by checking your blood sugar (glucose) after you have not eaten for a while (fasting). You may have this done every 1-3 years.  Bone density scan. This is done to screen for osteoporosis. You may have this done starting at age 42.  Mammogram. This may be done every 1-2 years. Talk to your health care provider about how often you should have regular mammograms. Talk with your health care provider about your test results,  treatment options, and if necessary, the need for more tests. Vaccines  Your health care provider may recommend certain vaccines, such as:  Influenza vaccine. This is recommended every year.  Tetanus, diphtheria, and acellular pertussis (Tdap, Td) vaccine. You may need a  Td booster every 10 years.  Zoster vaccine. You may need this after age 52.  Pneumococcal 13-valent conjugate (PCV13) vaccine. One dose is recommended after age 68.  Pneumococcal polysaccharide (PPSV23) vaccine. One dose is recommended after age 14. Talk to your health care provider about which screenings and vaccines you need and how often you need them. This information is not intended to replace advice given to you by your health care provider. Make sure you discuss any questions you have with your health care provider. Document Released: 07/03/2015 Document Revised: 02/24/2016 Document Reviewed: 04/07/2015 Elsevier Interactive Patient Education  2017 [employer] Prevention in the Home Falls can cause injuries. They can happen to people of all ages. There are many things you can do to make your home safe and to help prevent falls. What can I do on the outside of my home?  Regularly fix the edges of walkways and driveways and fix any cracks.  Remove anything that might make you trip as you walk through a door, such as a raised step or threshold.  Trim any bushes or trees on the path to your home.  Use bright outdoor lighting.  Clear any walking paths of anything that might make someone trip, such as rocks or tools.  Regularly check to see if handrails are loose or broken. Make sure that both sides of any steps have handrails.  Any raised decks and porches should have guardrails on the edges.  Have any leaves, snow, or ice cleared regularly.  Use sand or salt on walking paths during winter.  Clean up any spills in your garage right away. This includes oil or grease spills. What can I do in the bathroom?  Use night lights.  Install grab bars by the toilet and in the tub and shower. Do not use towel bars as grab bars.  Use non-skid mats or decals in the tub or shower.  If you need to sit down in the shower, use a plastic, non-slip stool.  Keep the floor dry. Clean  up any water that spills on the floor as soon as it happens.  Remove soap buildup in the tub or shower regularly.  Attach bath mats securely with double-sided non-slip rug tape.  Do not have throw rugs and other things on the floor that can make you trip. What can I do in the bedroom?  Use night lights.  Make sure that you have a light by your bed that is easy to reach.  Do not use any sheets or blankets that are too big for your bed. They should not hang down onto the floor.  Have a firm chair that has side arms. You can use this for support while you get dressed.  Do not have throw rugs and other things on the floor that can make you trip. What can I do in the kitchen?  Clean up any spills right away.  Avoid walking on wet floors.  Keep items that you use a lot in easy-to-reach places.  If you need to reach something above you, use a strong step stool that has a grab bar.  Keep electrical cords out of the way.  Do not use floor polish or wax that makes floors  slippery. If you must use wax, use non-skid floor wax.  Do not have throw rugs and other things on the floor that can make you trip. What can I do with my stairs?  Do not leave any items on the stairs.  Make sure that there are handrails on both sides of the stairs and use them. Fix handrails that are broken or loose. Make sure that handrails are as long as the stairways.  Check any carpeting to make sure that it is firmly attached to the stairs. Fix any carpet that is loose or worn.  Avoid having throw rugs at the top or bottom of the stairs. If you do have throw rugs, attach them to the floor with carpet tape.  Make sure that you have a light switch at the top of the stairs and the bottom of the stairs. If you do not have them, ask someone to add them for you. What else can I do to help prevent falls?  Wear shoes that:  Do not have high heels.  Have rubber bottoms.  Are comfortable and fit you well.  Are  closed at the toe. Do not wear sandals.  If you use a stepladder:  Make sure that it is fully opened. Do not climb a closed stepladder.  Make sure that both sides of the stepladder are locked into place.  Ask someone to hold it for you, if possible.  Clearly mark and make sure that you can see:  Any grab bars or handrails.  First and last steps.  Where the edge of each step is.  Use tools that help you move around (mobility aids) if they are needed. These include:  Canes.  Walkers.  Scooters.  Crutches.  Turn on the lights when you go into a dark area. Replace any light bulbs as soon as they burn out.  Set up your furniture so you have a clear path. Avoid moving your furniture around.  If any of your floors are uneven, fix them.  If there are any pets around you, be aware of where they are.  Review your medicines with your doctor. Some medicines can make you feel dizzy. This can increase your chance of falling. Ask your doctor what other things that you can do to help prevent falls. This information is not intended to replace advice given to you by your health care provider. Make sure you discuss any questions you have with your health care provider. Document Released: 04/02/2009 Document Revised: 11/12/2015 Document Reviewed: 07/11/2014 Elsevier Interactive Patient Education  2017 [employer].

## 2019-05-28 ENCOUNTER — Ambulatory Visit: Payer: Medicare Other | Admitting: Family Medicine

## 2019-07-04 ENCOUNTER — Ambulatory Visit: Payer: Medicare Other | Admitting: Family Medicine

## 2019-07-10 ENCOUNTER — Ambulatory Visit: Payer: Medicare Other | Admitting: Family Medicine

## 2019-07-11 ENCOUNTER — Ambulatory Visit (INDEPENDENT_AMBULATORY_CARE_PROVIDER_SITE_OTHER): Payer: Medicare Other | Admitting: Family Medicine

## 2019-07-11 ENCOUNTER — Other Ambulatory Visit: Payer: Self-pay

## 2019-07-11 VITALS — BP 114/80 | Ht 67.0 in | Wt 177.0 lb

## 2019-07-11 DIAGNOSIS — I10 Essential (primary) hypertension: Secondary | ICD-10-CM

## 2019-07-11 DIAGNOSIS — F439 Reaction to severe stress, unspecified: Secondary | ICD-10-CM

## 2019-07-11 DIAGNOSIS — F419 Anxiety disorder, unspecified: Secondary | ICD-10-CM | POA: Diagnosis not present

## 2019-07-11 DIAGNOSIS — F322 Major depressive disorder, single episode, severe without psychotic features: Secondary | ICD-10-CM | POA: Diagnosis not present

## 2019-07-11 DIAGNOSIS — F5105 Insomnia due to other mental disorder: Secondary | ICD-10-CM

## 2019-07-11 MED ORDER — SERTRALINE HCL 50 MG PO TABS: ORAL_TABLET | ORAL | 3 refills | Status: DC

## 2019-07-11 NOTE — Patient Instructions (Addendum)
Phone follow up with MD in 5 months, call if you need me before    Wishing you all the best as you care for your Mom  Recent labs are excellent , and it is good that you have re scheduled the dexa and mammogram  Thanks for choosing [facility], we consider it a privelige to serve you.

## 2019-07-17 ENCOUNTER — Telehealth: Payer: Self-pay | Admitting: *Deleted

## 2019-07-17 NOTE — Telephone Encounter (Signed)
Patient wants to know if you received the results of the bone scan completed in Dec from [place] and if so what are your thoughts on it.

## 2019-07-17 NOTE — Telephone Encounter (Signed)
Pt is calling about her bone density results as she forgot to ask dr Moshe Cipro at her appt

## 2019-07-22 ENCOUNTER — Encounter: Payer: Self-pay | Admitting: Family Medicine

## 2019-07-22 NOTE — Assessment & Plan Note (Signed)
rept dexa  To follow up

## 2019-07-22 NOTE — Assessment & Plan Note (Signed)
Progressively worsening with deterioration in Mom's dementia as well as Channah's ability to cope

## 2019-07-22 NOTE — Telephone Encounter (Signed)
No I do not have the result, pls re check electronic record, I do not seeit scanned in, if this is the case please send for report so I can eview in response to pt's question. Thanks

## 2019-07-22 NOTE — Assessment & Plan Note (Signed)
Unchanged, medications as before, encouraged to continue to work on improving living situation for both herself and her Mom

## 2019-07-22 NOTE — Assessment & Plan Note (Signed)
Not suicidal or homicidal, provoked by home environment and life  Circumstance. Currently lioned up with mental health services, certainly a therapist, recommend Psychiatry to med adjust but resists, also states she was intolerant o higher dose of trazodone, will reassess in 5 months, understands the need to call if deteriorates changes her mind about getting assistance

## 2019-07-22 NOTE — Progress Notes (Signed)
Virtual Visit via Telephone Note  I connected with Tiffany Mendoza on 07/22/19 at  3:00 PM EST by telephone and verified that I am speaking with the correct person using two identifiers.  Location: Patient: home Provider: office   I discussed the limitations, risks, security and privacy concerns of performing an evaluation and management service by telephone and the availability of in person appointments. I also discussed with the patient that there may be a patient responsible charge related to this service. The patient expressed understanding and agreed to proceed.   History of Present Illness: F/U chronic problems, medication review, and refill medication when necessary. Review most recent labs and order labs which are due Review preventive health and update with necessary referrals or immunizations as indicated Denies recent fever or chills. Denies sinus pressure, nasal congestion, ear pain or sore throat. Denies chest congestion, productive cough or wheezing. Denies chest pains, palpitations and leg swelling Denies abdominal pain, nausea, vomiting,diarrhea or constipation.   Denies dysuria, frequency, hesitancy or incontinence. Denies joint pain, swelling and limitation in mobility. Denies headaches, seizures, numbness, or tingling. C/o ongoing stress, due to caring for her Mother with severe dementia , and insufficient  support from siblings. Not suicidal or  Homicidal, getting new mental health assistance in the near future C/o inadequate exercise and feels constrained at home due to Covid      Observations/Objective: BP 114/80   Ht 5\' 7"  (1.702 m)   Wt 177 lb (80.3 kg)   BMI 27.72 kg/m  Good communication with no confusion and intact memory. Alert and oriented x 3 No signs of respiratory distress during speech    Assessment and Plan: Essential hypertension Controlled, no change in medication DASH diet and commitment to daily physical activity for a minimum of 30  minutes discussed and encouraged, as a part of hypertension management. The importance of attaining a healthy weight is also discussed.  BP/Weight 07/11/2019 05/27/2019 04/02/2019 01/08/2019 10/11/2018 07/04/2018 [id number]  Systolic BP 99991111 [id number] 123456 [id number]  Diastolic BP 80 80 80 70 72 72 87  Wt. (Lbs) 177 177 177 173 178 178.38 178  BMI 27.72 27.72 27.72 27.1 27.88 27.94 27.88       Depression, major, single episode, severe (HCC) Not suicidal or homicidal, provoked by home environment and life  Circumstance. Currently lioned up with mental health services, certainly a therapist, recommend Psychiatry to med adjust but resists, also states she was intolerant o higher dose of trazodone, will reassess in 5 months, understands the need to call if deteriorates changes her mind about getting assistance  Anxiety Unchanged, medications as before, encouraged to continue to work on improving living situation for both herself and her Mom  Insomnia secondary to anxiety Sleep hygiene reviewed and written information offered also. Prescription sent for  medication needed.   UNSPECIFIED OSTEOPOROSIS rept dexa  To follow up  Stress at home Progressively worsening with deterioration in Mom's dementia as well as Areya's ability to cope    Follow Up Instructions:    I discussed the assessment and treatment plan with the patient. The patient was provided an opportunity to ask questions and all were answered. The patient agreed with the plan and demonstrated an understanding of the instructions.   The patient was advised to call back or seek an in-person evaluation if the symptoms worsen or if the condition fails to improve as anticipated.  I provided 20 minutes of non-face-to-face time during this encounter.   Tula Nakayama,  MD   

## 2019-07-22 NOTE — Assessment & Plan Note (Signed)
Sleep hygiene reviewed and written information offered also. Prescription sent for  medication needed.  

## 2019-07-22 NOTE — Assessment & Plan Note (Signed)
Controlled, no change in medication DASH diet and commitment to daily physical activity for a minimum of 30 minutes discussed and encouraged, as a part of hypertension management. The importance of attaining a healthy weight is also discussed.  BP/Weight 07/11/2019 05/27/2019 04/02/2019 01/08/2019 10/11/2018 07/04/2018 [id number]  Systolic BP 99991111 99991111 99991111 123456 [id number]  Diastolic BP 80 80 80 70 72 72 87  Wt. (Lbs) 177 177 177 173 178 178.38 178  BMI 27.72 27.72 27.72 27.1 27.88 27.94 27.88

## 2019-07-23 NOTE — Telephone Encounter (Signed)
Pls let her know I have received the report , it is excellent, her bone density is NORMAL, no change rept in 5 years

## 2019-07-23 NOTE — Telephone Encounter (Signed)
Mychart message sent that report was normal

## 2019-08-13 ENCOUNTER — Other Ambulatory Visit: Payer: Self-pay | Admitting: Family Medicine

## 2019-08-24 ENCOUNTER — Ambulatory Visit: Payer: Medicare Other | Attending: Internal Medicine

## 2019-08-24 DIAGNOSIS — Z23 Encounter for immunization: Secondary | ICD-10-CM | POA: Insufficient documentation

## 2019-08-24 NOTE — Progress Notes (Signed)
   Covid-19 Vaccination Clinic  Name:  Tiffany Mendoza    MRN: [id number] DOB: [birth year]  08/24/2019  Tiffany Mendoza was observed post Covid-19 immunization for 15 minutes without incident. She was provided with Vaccine Information Sheet and instruction to access the V-Safe system.   Tiffany Mendoza was instructed to call 911 with any severe reactions post vaccine: Marland Kitchen Difficulty breathing  . Swelling of face and throat  . A fast heartbeat  . A bad rash all over body  . Dizziness and weakness   Immunizations Administered    Name Date Dose VIS Date Route   Moderna COVID-19 Vaccine 08/24/2019 11:07 AM 0.5 mL 05/21/2019 Intramuscular   Manufacturer: Moderna   Lot: [id number]   [place][id number]

## 2019-09-10 ENCOUNTER — Other Ambulatory Visit: Payer: Self-pay | Admitting: Family Medicine

## 2019-09-25 ENCOUNTER — Ambulatory Visit: Payer: Medicare Other | Attending: Internal Medicine

## 2019-09-25 DIAGNOSIS — Z23 Encounter for immunization: Secondary | ICD-10-CM

## 2019-09-25 NOTE — Progress Notes (Signed)
   Covid-19 Vaccination Clinic  Name:  Tiffany Mendoza    MRN: [id number] DOB: [birth year]  09/25/2019  [name] was observed post Covid-19 immunization for 15 minutes without incident. She was provided with Vaccine Information Sheet and instruction to access the V-Safe system.   [name] was instructed to call 911 with any severe reactions post vaccine: Marland Kitchen Difficulty breathing  . Swelling of face and throat  . A fast heartbeat  . A bad rash all over body  . Dizziness and weakness   Immunizations Administered    Name Date Dose VIS Date Route   Moderna COVID-19 Vaccine 09/25/2019 12:06 PM 0.5 mL 05/21/2019 Intramuscular   Manufacturer: Levan Hurst   LotFY:[id number]   [place][id number]

## 2019-09-26 ENCOUNTER — Telehealth: Payer: Self-pay | Admitting: *Deleted

## 2019-09-26 ENCOUNTER — Ambulatory Visit: Payer: Medicare Other

## 2019-09-26 NOTE — Telephone Encounter (Signed)
Patient called with concerns regarding #2 Covid vaccine. Answered questions. She talked about her mother and what she thought was her need for assisted living care at this time-listened. Patient thanked me for listening.

## 2019-12-05 ENCOUNTER — Other Ambulatory Visit: Payer: Self-pay | Admitting: Family Medicine

## 2019-12-09 ENCOUNTER — Ambulatory Visit: Payer: Medicare Other | Admitting: Family Medicine

## 2019-12-09 ENCOUNTER — Telehealth: Payer: Medicare Other | Admitting: Family Medicine

## 2020-01-02 ENCOUNTER — Telehealth (INDEPENDENT_AMBULATORY_CARE_PROVIDER_SITE_OTHER): Payer: Medicare Other | Admitting: Family Medicine

## 2020-01-02 ENCOUNTER — Other Ambulatory Visit: Payer: Self-pay

## 2020-01-02 VITALS — BP 114/80 | Ht 67.0 in | Wt 177.0 lb

## 2020-01-02 DIAGNOSIS — F419 Anxiety disorder, unspecified: Secondary | ICD-10-CM

## 2020-01-02 DIAGNOSIS — F439 Reaction to severe stress, unspecified: Secondary | ICD-10-CM | POA: Diagnosis not present

## 2020-01-02 DIAGNOSIS — E559 Vitamin D deficiency, unspecified: Secondary | ICD-10-CM

## 2020-01-02 DIAGNOSIS — R768 Other specified abnormal immunological findings in serum: Secondary | ICD-10-CM

## 2020-01-02 DIAGNOSIS — E663 Overweight: Secondary | ICD-10-CM

## 2020-01-02 DIAGNOSIS — I10 Essential (primary) hypertension: Secondary | ICD-10-CM

## 2020-01-02 DIAGNOSIS — F5105 Insomnia due to other mental disorder: Secondary | ICD-10-CM

## 2020-01-02 DIAGNOSIS — Z13220 Encounter for screening for lipoid disorders: Secondary | ICD-10-CM

## 2020-01-02 DIAGNOSIS — F418 Other specified anxiety disorders: Secondary | ICD-10-CM

## 2020-01-02 NOTE — Patient Instructions (Addendum)
F/U with MD in office in 5 months, call if you need me sooner  Please get fasting CBC, lipid, cmp and eGFr, TSH and vit d in the next 1 to 2 weeks  No change in medications  Continue exercising and stretching at least 5 days/ week for 30 minutes each  Day, you may split this into two 15 minute sessions  Continue spirituality  We will send for mammogram from your Gy[facility]  Think about what you will eat, plan ahead. Choose " clean, green, fresh or frozen" over canned, processed or packaged foods which are more sugary, salty and fatty. 70 to 75% of food eaten should be vegetables and fruit. Three meals at set times with snacks allowed between meals, but they must be fruit or vegetables. Aim to eat over a 12 hour period , example 7 am to 7 pm, and STOP after  your last meal of the day. Drink water,generally about 64 ounces per day, no other drink is as healthy. Fruit juice is best enjoyed in a healthy way, by EATING the fruit.

## 2020-01-02 NOTE — Assessment & Plan Note (Signed)
  Patient re-educated about  the importance of commitment to a  minimum of 150 minutes of exercise per week as able.  The importance of healthy food choices with portion control discussed, as well as eating regularly and within a 12 hour window most days. The need to choose "clean , green" food 50 to 75% of the time is discussed, as well as to make water the primary drink and set a goal of 64 ounces water daily.    Weight /BMI 01/02/2020 07/11/2019 05/27/2019  WEIGHT 177 lb 177 lb 177 lb  HEIGHT 5\' 7"  5\' 7"  5\' 7"   BMI 27.72 kg/m2 27.72 kg/m2 27.72 kg/m2

## 2020-01-02 NOTE — Assessment & Plan Note (Signed)
Controlled, no change in medication  

## 2020-01-02 NOTE — Progress Notes (Signed)
Virtual Visit via Telephone Note  I connected with Tiffany Mendoza on 01/02/20 at  1:00 PM EDT by video and verified that I am speaking with the correct person using two identifiers.  Location: Patient: home Provider: office   I discussed the limitations, risks, security and privacy concerns of performing an evaluation and management service by telephone and the availability of in person appointments. I also discussed with the patient that there may be a patient responsible charge related to this service. The patient expressed understanding and agreed to proceed.   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 Started with a weekly counsellor around February and this has helped alot  No new concerns RESP: denies sinus pressure , increased  Drainage or productive cough CVS: Denies chest pain, palpitation, PND , orthopnea or leg swelling GI: denies abdominal pain, constipation, loose stool, nausea or vomit GU; denies dysuria, frequency or incontinence MS: denies joint pain or stiffness Psych: not suicidal or homicidal, reports focusing less on and depending less on her sibs to help with he mom, states no physical relief since 01/2018 States younger family members have been physically supportive and she appreciates this Observations/Objective: BP 114/80   Ht 5\' 7"  (1.702 m)   Wt 177 lb (80.3 kg)   BMI 27.72 kg/m  Good communication with no confusion and intact memory. Alert and oriented x 3 No signs of respiratory distress during speech    Assessment and Plan:  Essential hypertension Controlled, no change in medication DASH diet and commitment to daily physical activity for a minimum of 30 minutes discussed and encouraged, as a part of hypertension management. The importance of attaining a healthy weight is also discussed.  BP/Weight 01/02/2020 07/11/2019 05/27/2019 04/02/2019 01/08/2019 10/11/2018 08/03/863  Systolic BP 784 696 295 284 132 440 102  Diastolic BP  80 80 80 80 70 72 72  Wt. (Lbs) 177 177 177 177 173 178 178.38  BMI 27.72 27.72 27.72 27.72 27.1 27.88 27.94       Anxiety Controlled, no change in medication   Overweight (BMI 25.0-29.9)  Patient re-educated about  the importance of commitment to a  minimum of 150 minutes of exercise per week as able.  The importance of healthy food choices with portion control discussed, as well as eating regularly and within a 12 hour window most days. The need to choose "clean , green" food 50 to 75% of the time is discussed, as well as to make water the primary drink and set a goal of 64 ounces water daily.    Weight /BMI 01/02/2020 07/11/2019 05/27/2019  WEIGHT 177 lb 177 lb 177 lb  HEIGHT 5\' 7"  5\' 7"  5\' 7"   BMI 27.72 kg/m2 27.72 kg/m2 27.72 kg/m2      Depression with anxiety continue current medication Weekly psychological support for pt is invaluable and is making a tremendous difference with positive impact, no change in current management  Elevated serum immunoglobulin free light chain level Followed by heme/onc  Insomnia secondary to anxiety Sleep hygiene reviewed and written information offered also. Prescription sent for  medication needed.   Stress at home ongoing as she cares for her Mother with dementia, however , support from younger family members as well as therapist and support that she gets from [employer] are al adding up in increments to help her  and she is appreciative   Follow Up Instructions:    I discussed the assessment and treatment plan with the patient. The patient was  provided an opportunity to ask questions and all were answered. The patient agreed with the plan and demonstrated an understanding of the instructions.   The patient was advised to call back or seek an in-person evaluation if the symptoms worsen or if the condition fails to improve as anticipated.  I provided 25 minutes of non-face-to-face time during this encounter.   Tula Nakayama, MD

## 2020-01-02 NOTE — Assessment & Plan Note (Signed)
Controlled, no change in medication DASH diet and commitment to daily physical activity for a minimum of 30 minutes discussed and encouraged, as a part of hypertension management. The importance of attaining a healthy weight is also discussed.  BP/Weight 01/02/2020 07/11/2019 05/27/2019 04/02/2019 01/08/2019 10/11/2018 7/53/0104  Systolic BP 045 913 685 992 341 443 601  Diastolic BP 80 80 80 80 70 72 72  Wt. (Lbs) 177 177 177 177 173 178 178.38  BMI 27.72 27.72 27.72 27.72 27.1 27.88 27.94

## 2020-01-04 ENCOUNTER — Encounter: Payer: Self-pay | Admitting: Family Medicine

## 2020-01-04 NOTE — Assessment & Plan Note (Signed)
Followed by heme/ onc 

## 2020-01-04 NOTE — Assessment & Plan Note (Signed)
continue current medication Weekly psychological support for pt is invaluable and is making a tremendous difference with positive impact, no change in current management

## 2020-01-04 NOTE — Assessment & Plan Note (Signed)
ongoing as she cares for her Mother with dementia, however , support from younger family members as well as therapist and support that she gets from [employer] are al adding up in increments to help her  and she is appreciative

## 2020-01-04 NOTE — Assessment & Plan Note (Signed)
Sleep hygiene reviewed and written information offered also. Prescription sent for  medication needed.  

## 2020-01-08 ENCOUNTER — Other Ambulatory Visit: Payer: Self-pay | Admitting: Family Medicine

## 2020-01-30 ENCOUNTER — Other Ambulatory Visit: Payer: Self-pay

## 2020-01-30 ENCOUNTER — Telehealth: Payer: Self-pay

## 2020-01-30 DIAGNOSIS — I10 Essential (primary) hypertension: Secondary | ICD-10-CM

## 2020-01-30 DIAGNOSIS — E559 Vitamin D deficiency, unspecified: Secondary | ICD-10-CM

## 2020-01-30 DIAGNOSIS — Z13220 Encounter for screening for lipoid disorders: Secondary | ICD-10-CM

## 2020-01-30 NOTE — Telephone Encounter (Signed)
Pt wanting lab orders sent to [facility] from [facility].

## 2020-01-30 NOTE — Telephone Encounter (Signed)
Labs have been changed to [facility] as requested.

## 2020-02-03 ENCOUNTER — Other Ambulatory Visit: Payer: Self-pay | Admitting: Family Medicine

## 2020-03-09 ENCOUNTER — Other Ambulatory Visit: Payer: Self-pay | Admitting: Family Medicine

## 2020-03-26 ENCOUNTER — Ambulatory Visit: Payer: Medicare Other | Admitting: Family Medicine

## 2020-04-01 ENCOUNTER — Telehealth (INDEPENDENT_AMBULATORY_CARE_PROVIDER_SITE_OTHER): Payer: Medicare Other | Admitting: Family Medicine

## 2020-04-01 ENCOUNTER — Other Ambulatory Visit: Payer: Self-pay | Admitting: Family Medicine

## 2020-04-01 ENCOUNTER — Encounter: Payer: Self-pay | Admitting: Family Medicine

## 2020-04-01 ENCOUNTER — Other Ambulatory Visit: Payer: Self-pay

## 2020-04-01 VITALS — BP 114/80 | Ht 67.0 in | Wt 177.0 lb

## 2020-04-01 DIAGNOSIS — F419 Anxiety disorder, unspecified: Secondary | ICD-10-CM

## 2020-04-01 DIAGNOSIS — E559 Vitamin D deficiency, unspecified: Secondary | ICD-10-CM

## 2020-04-01 DIAGNOSIS — Z13220 Encounter for screening for lipoid disorders: Secondary | ICD-10-CM

## 2020-04-01 DIAGNOSIS — I10 Essential (primary) hypertension: Secondary | ICD-10-CM | POA: Diagnosis not present

## 2020-04-01 DIAGNOSIS — F418 Other specified anxiety disorders: Secondary | ICD-10-CM | POA: Diagnosis not present

## 2020-04-01 NOTE — Patient Instructions (Addendum)
Follow up in office with MD in 6 months, call if you need me before PLease schedule a nurse visit for flu vaccine, we expect you at around 1 pm tommorrow  Please get fasting labs which are long overdue, cBC, lipid, cmp and eGFr, tSH and Vit D  Specific concerns about circulation, aneurysms are best evaluated by vascular specialists , I will discuss further with you to understand if the concern is with your heart or with circulation before I refer    Thankful you are getting more assistance with your Mom  Try to spend time on your own health as far as regular exercise and healthy food choice is concerned. Thankful that you are better able to take care of yourself now  Careful not to fall

## 2020-04-01 NOTE — Progress Notes (Signed)
  Virtual Visit via Telephone Note  I connected with Tiffany Mendoza on 04/01/20 at  4:00 PM EDT by telephone and verified that I am speaking with the correct person using two identifiers. Video was attempted but was unsuccessful  Location: Patient: home  Provider: office   I discussed the limitations, risks, security and privacy concerns of performing an evaluation and management service by telephone and the availability of in person appointments. I also discussed with the patient that there may be a patient responsible charge related to this service. The patient expressed understanding and agreed to proceed.   History of Present Illness: C/o excess stress worried about caring for her mom recently diagnosed with AlZheimer's , states she is getting more help with her care, but still has significant stress Has concerns regarding her circulation which will need clarification re her specific questions befor an appropriate referral can be made VEL:[id number] negative   Observations/Objective: BP 114/80 Comment: has not checked it, last BP recorded is put in chart  Ht 5\' 7"  (1.702 m)   Wt 177 lb (80.3 kg)   BMI 27.72 kg/m  Good communication with no confusion and intact memory. Alert and oriented x 3 No signs of respiratory distress during speech  '  Assessment and Plan:  Essential hypertension Controlled, no change in medication DASH diet and commitment to daily physical activity for a minimum of 30 minutes discussed and encouraged, as a part of hypertension management. The importance of attaining a healthy weight is also discussed.  BP/Weight 04/01/2020 01/02/2020 07/11/2019 05/27/2019 04/02/2019 01/08/2019 2/58/5277  Systolic BP 824 235 361 443 154 008 676  Diastolic BP 80 80 80 80 80 70 72  Wt. (Lbs) 177 177 177 177 177 173 178  BMI 27.72 27.72 27.72 27.72 27.72 27.1 27.88       Depression with anxiety Needs updated PHQ 9 score, reports more anxiety than  depression   Follow Up Instructions:    I discussed the assessment and treatment plan with the patient. The patient was provided an opportunity to ask questions and all were answered. The patient agreed with the plan and demonstrated an understanding of the instructions.   The patient was advised to call back or seek an in-person evaluation if the symptoms worsen or if the condition fails to improve as anticipated.  I provided 20 minutes of non-face-to-face time during this encounter.   Tula Nakayama, MD

## 2020-04-02 ENCOUNTER — Ambulatory Visit: Payer: Medicare Other

## 2020-04-06 MED ORDER — HYDROCHLOROTHIAZIDE 25 MG PO TABS: 25.0000 mg | ORAL_TABLET | Freq: Every day | ORAL | 3 refills | Status: DC

## 2020-04-06 MED ORDER — SERTRALINE HCL 50 MG PO TABS: 50.0000 mg | ORAL_TABLET | Freq: Every day | ORAL | 5 refills | Status: DC

## 2020-04-06 MED ORDER — CLORAZEPATE DIPOTASSIUM 7.5 MG PO TABS: ORAL_TABLET | ORAL | 5 refills | Status: DC

## 2020-04-06 NOTE — Assessment & Plan Note (Signed)
Needs updated PHQ 9 score, reports more anxiety than depression

## 2020-04-06 NOTE — Assessment & Plan Note (Signed)
Controlled, no change in medication DASH diet and commitment to daily physical activity for a minimum of 30 minutes discussed and encouraged, as a part of hypertension management. The importance of attaining a healthy weight is also discussed.  BP/Weight 04/01/2020 01/02/2020 07/11/2019 05/27/2019 04/02/2019 01/08/2019 02/16/5620  Systolic BP 308 657 846 962 952 841 324  Diastolic BP 80 80 80 80 80 70 72  Wt. (Lbs) 177 177 177 177 177 173 178  BMI 27.72 27.72 27.72 27.72 27.72 27.1 27.88

## 2020-04-15 ENCOUNTER — Ambulatory Visit (INDEPENDENT_AMBULATORY_CARE_PROVIDER_SITE_OTHER): Payer: Medicare Other

## 2020-04-15 ENCOUNTER — Other Ambulatory Visit: Payer: Self-pay

## 2020-04-15 DIAGNOSIS — Z23 Encounter for immunization: Secondary | ICD-10-CM

## 2020-04-16 LAB — COMPLETE METABOLIC PANEL WITH GFR
AG Ratio: 1.4 (calc) (ref 1.0–2.5)
ALT: 19 U/L (ref 6–29)
AST: 19 U/L (ref 10–35)
Albumin: 4.2 g/dL (ref 3.6–5.1)
Alkaline phosphatase (APISO): 47 U/L (ref 37–153)
BUN: 10 mg/dL (ref 7–25)
CO2: 32 mmol/L (ref 20–32)
Calcium: 9.8 mg/dL (ref 8.6–10.4)
Chloride: 103 mmol/L (ref 98–110)
Creat: 0.83 mg/dL (ref 0.60–0.93)
GFR, Est African American: 82 mL/min/{1.73_m2} (ref >=60)
GFR, Est Non African American: 71 mL/min/{1.73_m2} (ref >=60)
Globulin: 2.9 g/dL (calc) (ref 1.9–3.7)
Glucose, Bld: 97 mg/dL (ref 65–99)
Potassium: 4.2 mmol/L (ref 3.5–5.3)
Sodium: 141 mmol/L (ref 135–146)
Total Bilirubin: 0.5 mg/dL (ref 0.2–1.2)
Total Protein: 7.1 g/dL (ref 6.1–8.1)

## 2020-04-16 LAB — CBC
HCT: 39.4 % (ref 35.0–45.0)
Hemoglobin: 13.3 g/dL (ref 11.7–15.5)
MCH: 31.3 pg (ref 27.0–33.0)
MCHC: 33.8 g/dL (ref 32.0–36.0)
MCV: 92.7 fL (ref 80.0–100.0)
MPV: 10.8 fL (ref 7.5–12.5)
Platelets: 235 10*3/uL (ref 140–400)
RBC: 4.25 10*6/uL (ref 3.80–5.10)
RDW: 12.4 % (ref 11.0–15.0)
WBC: 4.7 10*3/uL (ref 3.8–10.8)

## 2020-04-16 LAB — LIPID PANEL
Cholesterol: 176 mg/dL (ref <200)
HDL: 54 mg/dL (ref >=50)
LDL Cholesterol (Calc): 103 mg/dL (calc) — ABNORMAL HIGH
Non-HDL Cholesterol (Calc): 122 mg/dL (calc) (ref <130)
Total CHOL/HDL Ratio: 3.3 (calc) (ref <5.0)
Triglycerides: 91 mg/dL (ref <150)

## 2020-04-16 LAB — VITAMIN D 25 HYDROXY (VIT D DEFICIENCY, FRACTURES): Vit D, 25-Hydroxy: 47 ng/mL (ref 30–100)

## 2020-04-16 LAB — TSH: TSH: 3.23 mIU/L (ref 0.40–4.50)

## 2020-04-22 ENCOUNTER — Encounter: Payer: Self-pay | Admitting: Family Medicine

## 2020-04-30 ENCOUNTER — Other Ambulatory Visit (HOSPITAL_COMMUNITY): Payer: Self-pay | Admitting: *Deleted

## 2020-04-30 ENCOUNTER — Inpatient Hospital Stay (HOSPITAL_COMMUNITY): Payer: Medicare Other | Attending: Hematology

## 2020-04-30 ENCOUNTER — Other Ambulatory Visit: Payer: Self-pay

## 2020-04-30 ENCOUNTER — Ambulatory Visit (HOSPITAL_COMMUNITY)
Admission: RE | Admit: 2020-04-30 | Discharge: 2020-04-30 | Disposition: A | Discharge status: Home / Self Care | Payer: Medicare Other | Source: Ambulatory Visit | Attending: Hematology | Admitting: Hematology

## 2020-04-30 DIAGNOSIS — M47816 Spondylosis without myelopathy or radiculopathy, lumbar region: Secondary | ICD-10-CM | POA: Insufficient documentation

## 2020-04-30 DIAGNOSIS — Z79899 Other long term (current) drug therapy: Secondary | ICD-10-CM | POA: Diagnosis not present

## 2020-04-30 DIAGNOSIS — R630 Anorexia: Secondary | ICD-10-CM | POA: Diagnosis not present

## 2020-04-30 DIAGNOSIS — Z8349 Family history of other endocrine, nutritional and metabolic diseases: Secondary | ICD-10-CM | POA: Diagnosis not present

## 2020-04-30 DIAGNOSIS — R768 Other specified abnormal immunological findings in serum: Secondary | ICD-10-CM

## 2020-04-30 DIAGNOSIS — F32A Depression, unspecified: Secondary | ICD-10-CM | POA: Diagnosis not present

## 2020-04-30 DIAGNOSIS — G8929 Other chronic pain: Secondary | ICD-10-CM | POA: Insufficient documentation

## 2020-04-30 DIAGNOSIS — Z800 Family history of malignant neoplasm of digestive organs: Secondary | ICD-10-CM | POA: Insufficient documentation

## 2020-04-30 DIAGNOSIS — Z6379 Other stressful life events affecting family and household: Secondary | ICD-10-CM | POA: Insufficient documentation

## 2020-04-30 DIAGNOSIS — M47814 Spondylosis without myelopathy or radiculopathy, thoracic region: Secondary | ICD-10-CM | POA: Diagnosis not present

## 2020-04-30 DIAGNOSIS — I700 Atherosclerosis of aorta: Secondary | ICD-10-CM | POA: Insufficient documentation

## 2020-04-30 DIAGNOSIS — Z8371 Family history of colonic polyps: Secondary | ICD-10-CM | POA: Insufficient documentation

## 2020-04-30 DIAGNOSIS — Z8719 Personal history of other diseases of the digestive system: Secondary | ICD-10-CM | POA: Diagnosis not present

## 2020-04-30 DIAGNOSIS — Z8042 Family history of malignant neoplasm of prostate: Secondary | ICD-10-CM | POA: Insufficient documentation

## 2020-04-30 DIAGNOSIS — Z8249 Family history of ischemic heart disease and other diseases of the circulatory system: Secondary | ICD-10-CM | POA: Diagnosis not present

## 2020-04-30 DIAGNOSIS — R5383 Other fatigue: Secondary | ICD-10-CM | POA: Insufficient documentation

## 2020-04-30 DIAGNOSIS — E8581 Light chain (AL) amyloidosis: Secondary | ICD-10-CM | POA: Insufficient documentation

## 2020-04-30 DIAGNOSIS — M47812 Spondylosis without myelopathy or radiculopathy, cervical region: Secondary | ICD-10-CM | POA: Diagnosis not present

## 2020-04-30 DIAGNOSIS — Z818 Family history of other mental and behavioral disorders: Secondary | ICD-10-CM | POA: Insufficient documentation

## 2020-04-30 DIAGNOSIS — M549 Dorsalgia, unspecified: Secondary | ICD-10-CM | POA: Insufficient documentation

## 2020-04-30 LAB — COMPREHENSIVE METABOLIC PANEL
ALT: 19 U/L (ref 0–44)
AST: 21 U/L (ref 15–41)
Albumin: 4.2 g/dL (ref 3.5–5.0)
Alkaline Phosphatase: 45 U/L (ref 38–126)
Anion gap: 8 (ref 5–15)
BUN: 11 mg/dL (ref 8–23)
CO2: 27 mmol/L (ref 22–32)
Calcium: 9.2 mg/dL (ref 8.9–10.3)
Chloride: 103 mmol/L (ref 98–111)
Creatinine, Ser: 0.79 mg/dL (ref 0.44–1.00)
GFR, Estimated: >60 mL/min (ref >60)
Glucose, Bld: 108 mg/dL — ABNORMAL HIGH (ref 70–99)
Potassium: 3.6 mmol/L (ref 3.5–5.1)
Sodium: 138 mmol/L (ref 135–145)
Total Bilirubin: 0.8 mg/dL (ref 0.3–1.2)
Total Protein: 7.7 g/dL (ref 6.5–8.1)

## 2020-04-30 LAB — CBC WITH DIFFERENTIAL/PLATELET
Abs Immature Granulocytes: 0 10*3/uL (ref 0.00–0.07)
Basophils Absolute: 0 10*3/uL (ref 0.0–0.1)
Basophils Relative: 1 %
Eosinophils Absolute: 0.2 10*3/uL (ref 0.0–0.5)
Eosinophils Relative: 4 %
HCT: 40.5 % (ref 36.0–46.0)
Hemoglobin: 13.2 g/dL (ref 12.0–15.0)
Immature Granulocytes: 0 %
Lymphocytes Relative: 41 %
Lymphs Abs: 1.9 10*3/uL (ref 0.7–4.0)
MCH: 31.1 pg (ref 26.0–34.0)
MCHC: 32.6 g/dL (ref 30.0–36.0)
MCV: 95.5 fL (ref 80.0–100.0)
Monocytes Absolute: 0.3 10*3/uL (ref 0.1–1.0)
Monocytes Relative: 7 %
Neutro Abs: 2.3 10*3/uL (ref 1.7–7.7)
Neutrophils Relative %: 47 %
Platelets: 225 10*3/uL (ref 150–400)
RBC: 4.24 MIL/uL (ref 3.87–5.11)
RDW: 12.4 % (ref 11.5–15.5)
WBC: 4.7 10*3/uL (ref 4.0–10.5)
nRBC: 0 % (ref 0.0–0.2)

## 2020-04-30 LAB — LACTATE DEHYDROGENASE: LDH: 141 U/L (ref 98–192)

## 2020-05-01 LAB — PROTEIN ELECTROPHORESIS, SERUM
A/G Ratio: 1.1 (ref 0.7–1.7)
Albumin ELP: 3.7 g/dL (ref 2.9–4.4)
Alpha-1-Globulin: 0.2 g/dL (ref 0.0–0.4)
Alpha-2-Globulin: 0.8 g/dL (ref 0.4–1.0)
Beta Globulin: 1 g/dL (ref 0.7–1.3)
Gamma Globulin: 1.3 g/dL (ref 0.4–1.8)
Globulin, Total: 3.3 g/dL (ref 2.2–3.9)
Total Protein ELP: 7 g/dL (ref 6.0–8.5)

## 2020-05-01 LAB — IMMUNOFIXATION ELECTROPHORESIS
IgA: 178 mg/dL (ref 64–422)
IgG (Immunoglobin G), Serum: 1392 mg/dL (ref 586–1602)
IgM (Immunoglobulin M), Srm: 41 mg/dL (ref 26–217)
Total Protein ELP: 7 g/dL (ref 6.0–8.5)

## 2020-05-01 LAB — KAPPA/LAMBDA LIGHT CHAINS
Kappa free light chain: 25.9 mg/L — ABNORMAL HIGH (ref 3.3–19.4)
Kappa, lambda light chain ratio: 2.54 — ABNORMAL HIGH (ref 0.26–1.65)
Lambda free light chains: 10.2 mg/L (ref 5.7–26.3)

## 2020-05-05 ENCOUNTER — Encounter (HOSPITAL_COMMUNITY): Payer: Self-pay

## 2020-05-07 ENCOUNTER — Inpatient Hospital Stay (HOSPITAL_BASED_OUTPATIENT_CLINIC_OR_DEPARTMENT_OTHER): Payer: Medicare Other | Admitting: Hematology

## 2020-05-07 ENCOUNTER — Other Ambulatory Visit: Payer: Self-pay

## 2020-05-07 VITALS — BP 142/84 | HR 72 | Temp 97.3°F | Resp 18 | Wt 182.5 lb

## 2020-05-07 DIAGNOSIS — R768 Other specified abnormal immunological findings in serum: Secondary | ICD-10-CM | POA: Diagnosis not present

## 2020-05-07 DIAGNOSIS — E8581 Light chain (AL) amyloidosis: Secondary | ICD-10-CM | POA: Diagnosis not present

## 2020-05-07 NOTE — Progress Notes (Signed)
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 Tiffany Mendoza was last contacted via telephone on 05/15/2019.  Today she reports feeling well. She complains of having lower back pain which is chronic since a fall multiple years ago and she denies having any new pains. She reports having stress due to taking care of her mother who has dementia. She denies having any cardiac issues, leg swelling or numbness.  She received her Moderna booster yesterday.   REVIEW OF SYSTEMS:  Review of Systems  Constitutional: Positive for appetite change (75%) and fatigue (50%).  Cardiovascular: Negative for leg swelling.  Musculoskeletal: Negative for arthralgias.  Neurological: Negative for numbness.  All other systems reviewed and are negative.   PAST MEDICAL/SURGICAL HISTORY:  Past Medical History:  Diagnosis Date  . Adjustment disorder with mixed anxiety and depressed mood   . Allergy   . Carotid bruit   . Depression   . Dermatophytosis of foot   . GERD (gastroesophageal reflux disease)   . Hypertension   . Insomnia   . Neck pain   . Osteoporosis    Past Surgical History:  Procedure Laterality Date  . ABDOMINAL HYSTERECTOMY  1988  . HEMORRHOID SURGERY    . myoectomy  1984    SOCIAL HISTORY:  Social History   Socioeconomic History  . Marital status: Single    Spouse name: Not on file  . Number of children: Not on file  . Years of education: 12+  . Highest education level: 12th grade  Occupational History  . Not on file  Tobacco Use  .  Smoking status: Never Smoker  . Smokeless tobacco: Never Used  Vaping Use  . Vaping Use: Never used  Substance and Sexual Activity  . Alcohol use: Yes    Comment: occasional wine  . Drug use: No  . Sexual activity: Not Currently  Other Topics Concern  . Not on file  Social History Narrative   Lives alone with Mother who has dementia    Social Determinants of Health   Financial Resource Strain:   . Difficulty of Paying Living Expenses: Not on file  Food Insecurity:   . Worried About Charity fundraiser in the Last Year: Not on file  . Ran Out of Food in the Last Year: Not on file  Transportation Needs:   . Lack of Transportation (Medical): Not on file  . Lack of Transportation (Non-Medical): Not on file  Physical Activity:   . Days of Exercise per Week: Not on file  . Minutes of Exercise per Session: Not on file  Stress: Stress Concern Present  . Feeling of Stress : Rather much  Social Connections:   . Frequency of Communication with Friends and Family: Not on file  . Frequency of Social Gatherings with Friends and Family: Not on file  . Attends Religious Services: Not on file  . Active Member of Clubs or Organizations: Not on file  . Attends Archivist Meetings: Not on file  . Marital Status: Not on file  Intimate Partner Violence:   .  Fear of Current or Ex-Partner: Not on file  . Emotionally Abused: Not on file  . Physically Abused: Not on file  . Sexually Abused: Not on file    FAMILY HISTORY:  Family History  Problem Relation Age of Onset  . Colon polyps Father   . Prostate cancer Father   . ALS Father   . Thyroid disease Sister   . Thyroid nodules Sister        Thyroidectomy in 1984  . Memory loss Mother   . Fibroids Sister   . Hypertension Sister   . Liver cancer Paternal Grandmother     CURRENT MEDICATIONS:  Current Outpatient Medications  Medication Sig Dispense Refill  . Apoaequorin (PREVAGEN EXTRA STRENGTH PO) Take 1 each by mouth daily.     . Ascorbic Acid (VITAMIN C) 1000 MG tablet Take 1,000 mg by mouth daily as needed (supplement).    . Cholecalciferol (VITAMIN D3 PO) Take 5,000 Units by mouth daily.    . clorazepate (TRANXENE) 7.5 MG tablet Take one tablet by mouth once daily , as needed, for anxiety 30 tablet 5  . hydrochlorothiazide (HYDRODIURIL) 25 MG tablet Take 1 tablet (25 mg total) by mouth daily. 90 tablet 3  . Omega-3 Fatty Acids (FISH OIL) 1000 MG CAPS Take by mouth.    . Polyethylene Glycol 3350 (MIRALAX PO) Take by mouth daily as needed.     . Potassium 99 MG TABS Take by mouth daily.      . PSYLLIUM HUSK PO Take by mouth. 2 capsules daily    . SERTRALINE HCL PO Take 75 mg by mouth daily.    . TURMERIC PO Take 1 Dose by mouth daily as needed (supplement).    . Zinc 50 MG TABS Take 50 mg by mouth daily as needed (supplement).     No current facility-administered medications for this visit.    ALLERGIES:  No Known Allergies  PHYSICAL EXAM:  Performance status (ECOG): 0 - Asymptomatic  Vitals:   05/07/20 1441  BP: (!) 142/84  Pulse: 72  Resp: 18  Temp: (!) 97.3 F (36.3 C)  SpO2: 97%   Wt Readings from Last 3 Encounters:  05/07/20 182 lb 8 oz (82.8 kg)  04/01/20 177 lb (80.3 kg)  01/02/20 177 lb (80.3 kg)   Physical Exam Vitals reviewed.  Constitutional:      Appearance: Normal appearance.  Cardiovascular:     Rate and Rhythm: Normal rate and regular rhythm.     Pulses: Normal pulses.     Heart sounds: Normal heart sounds.  Pulmonary:     Effort: Pulmonary effort is normal.     Breath sounds: Normal breath sounds.  Abdominal:     Palpations: Abdomen is soft. There is no hepatomegaly, splenomegaly or mass.     Tenderness: There is no abdominal tenderness.  Musculoskeletal:     Right lower leg: No edema.     Left lower leg: No edema.  Neurological:     General: No focal deficit present.     Mental Status: She is alert and oriented to person, place, and time.  Psychiatric:         Mood and Affect: Mood normal.        Behavior: Behavior normal.     LABORATORY DATA:  I have reviewed the labs as listed.  CBC Latest Ref Rng & Units 04/30/2020 04/15/2020 05/08/2019  WBC 4.0 - 10.5 K/uL 4.7 4.7 5.8  Hemoglobin 12.0 - 15.0 g/dL 13.2 13.3 13.2  Hematocrit 36 - 46 % 40.5 39.4 40.6  Platelets 150 - 400 K/uL 225 235 229   CMP Latest Ref Rng & Units 04/30/2020 04/15/2020 05/08/2019  Glucose 70 - 99 mg/dL 108(H) 97 86  BUN 8 - 23 mg/dL 11 10 10   Creatinine 0.44 - 1.00 mg/dL 0.79 0.83 0.80  Sodium 135 - 145 mmol/L 138 141 140  Potassium 3.5 - 5.1 mmol/L 3.6 4.2 3.9  Chloride 98 - 111 mmol/L 103 103 103  CO2 22 - 32 mmol/L 27 32 30  Calcium 8.9 - 10.3 mg/dL 9.2 9.8 9.4  Total Protein 6.5 - 8.1 g/dL 7.7 7.1 7.7  Total Bilirubin 0.3 - 1.2 mg/dL 0.8 0.5 0.8  Alkaline Phos 38 - 126 U/L 45 - 47  AST 15 - 41 U/L 21 19 20   ALT 0 - 44 U/L 19 19 21       Component Value Date/Time   RBC 4.24 04/30/2020 1321   MCV 95.5 04/30/2020 1321   MCH 31.1 04/30/2020 1321   MCHC 32.6 04/30/2020 1321   RDW 12.4 04/30/2020 1321   LYMPHSABS 1.9 04/30/2020 1321   MONOABS 0.3 04/30/2020 1321   EOSABS 0.2 04/30/2020 1321   BASOSABS 0.0 04/30/2020 1321   Lab Results  Component Value Date   LDH 141 04/30/2020   LDH 143 05/08/2019   Lab Results  Component Value Date   TOTALPROTELP 7.0 04/30/2020   TOTALPROTELP 7.0 04/30/2020   ALBUMINELP 3.7 04/30/2020   A1GS 0.2 04/30/2020   A2GS 0.8 04/30/2020   BETS 1.0 04/30/2020   GAMS 1.3 04/30/2020   MSPIKE Not Observed 04/30/2020   SPEI Comment 04/30/2020    Lab Results  Component Value Date   KPAFRELGTCHN 25.9 (H) 04/30/2020   LAMBDASER 10.2 04/30/2020   KAPLAMBRATIO 2.54 (H) 04/30/2020    DIAGNOSTIC IMAGING:  I have independently reviewed the scans and discussed with the patient. DG Bone Survey Met  Result Date: 05/01/2020 CLINICAL DATA:  Elevated serum immunoglobulin free light chain, concern for myeloma EXAM: METASTATIC BONE  SURVEY COMPARISON:  10/19/2016 FINDINGS: Stable subcentimeter subtle indeterminate lucency in the proximal left humerus surgical neck area. No new or enlarging lytic or lucent lesions demonstrated throughout the skeletal survey. Normal heart size and vascularity. Lungs remain clear. Trachea midline. Aorta atherosclerotic. No acute chest process. Similar degenerative changes throughout the cervical, thoracic and lumbar spine as well as lower lumbar facet arthropathy. Similar severe bilateral SI joint arthropathy with sclerosis. Unchanged bilateral hip arthropathy. Midline symphysis pubis degenerative change. No acute osseous finding, fracture or malalignment. No pathologic fracture. IMPRESSION: No new or enlarging lucent or lytic skull lesions. See above comment. No definite evidence of myeloma or metastatic osseous disease. Aortic Atherosclerosis (ICD10-I70.0). Electronically Signed   By: Jerilynn Mages.  Shick M.D.   On: 05/01/2020 08:31     ASSESSMENT:  1.  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 -I reviewed her blood work from 05/08/2019.  Kappa light chains at 22.6 and ratio is 1.77.  CBC was normal.  SPEP was negative.  Creatinine was 0.8 and calcium 9.4. -Last skeletal survey on 10/20/2016 showed no new or enlarging suspicious lytic lesions.  Questionable subtle very small lucency in the proximal left humerus is stable. -Skeletal survey on 04/30/2020 with subcentimeter stable subtle indeterminate lucency in the proximal left humerus surgical neck area with no new or enlarging lytic lesions present.   PLAN:  1.  Elevated kappa light  chains and light chain ratio: -She denies any new onset bone pains.  No recurrent infections. -Reviewed labs from 04/30/2020.  Kappa light chains remain elevated at 25.9 with ratio of 2.54.  SPEP and fixation is negative. -Creatinine and calcium are normal.  Hemoglobin is  normal.  No "crab" features at this time. -Skeletal survey on 04/30/2020 shows stable subcentimeter subtle indeterminate lucency in the proximal left humerus surgical neck area with no new or enlarging lytic lesions present. -Recommend follow-up in 1 year with repeat myeloma labs and skeletal survey.  Orders placed this encounter:  No orders of the defined types were placed in this encounter.    Derek Jack, MD [place] [phone]   I, Milinda Antis, am acting as a scribe for Dr. Sanda Linger.  I, Derek Jack MD, have reviewed the above documentation for accuracy and completeness, and I agree with the above.

## 2020-05-07 NOTE — Patient Instructions (Addendum)
[place] at [facility] Discharge Instructions  You were seen today by Dr. Delton Coombes. He went over your recent results and scans. You will be scheduled for a scan of your bones before your next visit. Dr. Delton Coombes will see you back in 1 year for labs and follow up.   Thank you for choosing [place] at [facility] to provide your oncology and hematology care.  To afford each patient quality time with our provider, please arrive at least 15 minutes before your scheduled appointment time.   If you have a lab appointment with the [facility] please come in thru the Main Entrance and check in at the main information desk  You need to re-schedule your appointment should you arrive 10 or more minutes late.  We strive to give you quality time with our providers, and arriving late affects you and other patients whose appointments are after yours.  Also, if you no show three or more times for appointments you may be dismissed from the clinic at the providers discretion.     Again, thank you for choosing [facility].  Our hope is that these requests will decrease the amount of time that you wait before being seen by our physicians.       _____________________________________________________________  Should you have questions after your visit to [facility], please contact our office at [phone] between the hours of 8:00 a.m. and 4:30 p.m.  Voicemails left after 4:00 p.m. will not be returned until the following business day.  For prescription refill requests, have your pharmacy contact our office and allow 72 hours.    Cancer Center Support Programs:   > Cancer Support Group  2nd Tuesday of the month 1pm-2pm, Journey Room

## 2020-05-27 ENCOUNTER — Encounter: Payer: Medicare Other | Admitting: Family Medicine

## 2020-09-30 ENCOUNTER — Ambulatory Visit: Payer: Medicare Other | Admitting: Family Medicine

## 2020-10-05 ENCOUNTER — Other Ambulatory Visit: Payer: Self-pay | Admitting: Family Medicine

## 2020-10-07 ENCOUNTER — Ambulatory Visit: Payer: Medicare Other | Admitting: Family Medicine

## 2020-10-09 LAB — VITAMIN D 1,25 DIHYDROXY
Vitamin D 1, 25 (OH)2 Total: 52 pg/mL
Vitamin D2 1, 25 (OH)2: <10 pg/mL
Vitamin D3 1, 25 (OH)2: 52 pg/mL

## 2020-10-09 LAB — CBC WITH DIFFERENTIAL/PLATELET
Basophils Absolute: 0 10*3/uL (ref 0.0–0.2)
Basos: 1 %
EOS (ABSOLUTE): 0.1 10*3/uL (ref 0.0–0.4)
Eos: 1 %
Hematocrit: 39.6 % (ref 34.0–46.6)
Hemoglobin: 13.4 g/dL (ref 11.1–15.9)
Immature Grans (Abs): 0 10*3/uL (ref 0.0–0.1)
Immature Granulocytes: 0 %
Lymphocytes Absolute: 2.3 10*3/uL (ref 0.7–3.1)
Lymphs: 41 %
MCH: 30.9 pg (ref 26.6–33.0)
MCHC: 33.8 g/dL (ref 31.5–35.7)
MCV: 91 fL (ref 79–97)
Monocytes Absolute: 0.4 10*3/uL (ref 0.1–0.9)
Monocytes: 7 %
Neutrophils Absolute: 2.8 10*3/uL (ref 1.4–7.0)
Neutrophils: 50 %
Platelets: 248 10*3/uL (ref 150–450)
RBC: 4.34 x10E6/uL (ref 3.77–5.28)
RDW: 12.5 % (ref 11.7–15.4)
WBC: 5.6 10*3/uL (ref 3.4–10.8)

## 2020-10-09 LAB — CMP14+EGFR
ALT: 14 IU/L (ref 0–32)
AST: 19 IU/L (ref 0–40)
Albumin/Globulin Ratio: 1.7 (ref 1.2–2.2)
Albumin: 4.7 g/dL (ref 3.7–4.7)
Alkaline Phosphatase: 59 IU/L (ref 44–121)
BUN/Creatinine Ratio: 12 (ref 12–28)
BUN: 9 mg/dL (ref 8–27)
Bilirubin Total: 0.6 mg/dL (ref 0.0–1.2)
CO2: 22 mmol/L (ref 20–29)
Calcium: 9.8 mg/dL (ref 8.7–10.3)
Chloride: 102 mmol/L (ref 96–106)
Creatinine, Ser: 0.75 mg/dL (ref 0.57–1.00)
Globulin, Total: 2.7 g/dL (ref 1.5–4.5)
Glucose: 96 mg/dL (ref 65–99)
Potassium: 3.8 mmol/L (ref 3.5–5.2)
Sodium: 142 mmol/L (ref 134–144)
Total Protein: 7.4 g/dL (ref 6.0–8.5)
eGFR: 85 mL/min/{1.73_m2} (ref >59)

## 2020-10-09 LAB — LIPID PANEL
Chol/HDL Ratio: 3.1 ratio (ref 0.0–4.4)
Cholesterol, Total: 169 mg/dL (ref 100–199)
HDL: 55 mg/dL (ref >39)
LDL Chol Calc (NIH): 98 mg/dL (ref 0–99)
Triglycerides: 85 mg/dL (ref 0–149)
VLDL Cholesterol Cal: 16 mg/dL (ref 5–40)

## 2020-10-09 LAB — TSH: TSH: 3.45 u[IU]/mL (ref 0.450–4.500)

## 2020-10-10 ENCOUNTER — Other Ambulatory Visit: Payer: Self-pay | Admitting: Family Medicine

## 2020-10-13 ENCOUNTER — Other Ambulatory Visit (HOSPITAL_COMMUNITY): Payer: Self-pay | Admitting: Surgery

## 2020-10-13 ENCOUNTER — Telehealth (HOSPITAL_COMMUNITY): Payer: Self-pay | Admitting: Surgery

## 2020-10-13 DIAGNOSIS — R768 Other specified abnormal immunological findings in serum: Secondary | ICD-10-CM

## 2020-10-13 NOTE — Telephone Encounter (Signed)
Pt had called in stating that had been feeling very fatigued.  She is a caregiver for her mother and wanted to know if that stress could be making her disease process worse.  I notified Dr. Delton Coombes, and he ordered labs and a follow up appointment a week after the labs with [facility].  I called the pt to notify her of the lab appointment, and I will get Amy or Danielle to call the pt to schedule an appointment with Rebekah.

## 2020-10-16 ENCOUNTER — Inpatient Hospital Stay (HOSPITAL_COMMUNITY): Payer: Medicare Other

## 2020-10-19 ENCOUNTER — Ambulatory Visit: Payer: Medicare Other | Admitting: Family Medicine

## 2020-10-23 ENCOUNTER — Inpatient Hospital Stay (HOSPITAL_COMMUNITY): Payer: Medicare Other | Attending: Hematology and Oncology

## 2020-10-23 DIAGNOSIS — R768 Other specified abnormal immunological findings in serum: Secondary | ICD-10-CM | POA: Diagnosis present

## 2020-10-23 LAB — COMPREHENSIVE METABOLIC PANEL
ALT: 19 U/L (ref 0–44)
AST: 19 U/L (ref 15–41)
Albumin: 4.2 g/dL (ref 3.5–5.0)
Alkaline Phosphatase: 51 U/L (ref 38–126)
Anion gap: 6 (ref 5–15)
BUN: 13 mg/dL (ref 8–23)
CO2: 28 mmol/L (ref 22–32)
Calcium: 9.5 mg/dL (ref 8.9–10.3)
Chloride: 103 mmol/L (ref 98–111)
Creatinine, Ser: 0.72 mg/dL (ref 0.44–1.00)
GFR, Estimated: >60 mL/min (ref >60)
Glucose, Bld: 94 mg/dL (ref 70–99)
Potassium: 3.6 mmol/L (ref 3.5–5.1)
Sodium: 137 mmol/L (ref 135–145)
Total Bilirubin: 0.5 mg/dL (ref 0.3–1.2)
Total Protein: 7.6 g/dL (ref 6.5–8.1)

## 2020-10-23 LAB — CBC WITH DIFFERENTIAL/PLATELET
Abs Immature Granulocytes: 0 10*3/uL (ref 0.00–0.07)
Basophils Absolute: 0 10*3/uL (ref 0.0–0.1)
Basophils Relative: 0 %
Eosinophils Absolute: 0.1 10*3/uL (ref 0.0–0.5)
Eosinophils Relative: 1 %
HCT: 41 % (ref 36.0–46.0)
Hemoglobin: 13.5 g/dL (ref 12.0–15.0)
Immature Granulocytes: 0 %
Lymphocytes Relative: 33 %
Lymphs Abs: 1.6 10*3/uL (ref 0.7–4.0)
MCH: 31.5 pg (ref 26.0–34.0)
MCHC: 32.9 g/dL (ref 30.0–36.0)
MCV: 95.8 fL (ref 80.0–100.0)
Monocytes Absolute: 0.4 10*3/uL (ref 0.1–1.0)
Monocytes Relative: 8 %
Neutro Abs: 2.8 10*3/uL (ref 1.7–7.7)
Neutrophils Relative %: 58 %
Platelets: 231 10*3/uL (ref 150–400)
RBC: 4.28 MIL/uL (ref 3.87–5.11)
RDW: 12.3 % (ref 11.5–15.5)
WBC: 4.9 10*3/uL (ref 4.0–10.5)
nRBC: 0 % (ref 0.0–0.2)

## 2020-10-26 LAB — KAPPA/LAMBDA LIGHT CHAINS
Kappa free light chain: 20.1 mg/L — ABNORMAL HIGH (ref 3.3–19.4)
Kappa, lambda light chain ratio: 1.93 — ABNORMAL HIGH (ref 0.26–1.65)
Lambda free light chains: 10.4 mg/L (ref 5.7–26.3)

## 2020-10-27 LAB — IMMUNOFIXATION ELECTROPHORESIS
IgA: 174 mg/dL (ref 64–422)
IgG (Immunoglobin G), Serum: 1332 mg/dL (ref 586–1602)
IgM (Immunoglobulin M), Srm: 40 mg/dL (ref 26–217)
Total Protein ELP: 7.4 g/dL (ref 6.0–8.5)

## 2020-10-29 LAB — PROTEIN ELECTROPHORESIS, SERUM
A/G Ratio: 1.2 (ref 0.7–1.7)
Albumin ELP: 4.6 g/dL — ABNORMAL HIGH (ref 2.9–4.4)
Alpha-1-Globulin: 0.2 g/dL (ref 0.0–0.4)
Alpha-2-Globulin: 0.9 g/dL (ref 0.4–1.0)
Beta Globulin: 1.1 g/dL (ref 0.7–1.3)
Gamma Globulin: 1.4 g/dL (ref 0.4–1.8)
Globulin, Total: 3.7 g/dL (ref 2.2–3.9)
Total Protein ELP: 8.3 g/dL (ref 6.0–8.5)

## 2020-11-10 ENCOUNTER — Encounter: Payer: Self-pay | Admitting: Family Medicine

## 2020-11-10 ENCOUNTER — Telehealth (INDEPENDENT_AMBULATORY_CARE_PROVIDER_SITE_OTHER): Payer: Medicare Other | Admitting: Family Medicine

## 2020-11-10 ENCOUNTER — Other Ambulatory Visit: Payer: Self-pay

## 2020-11-10 VITALS — Ht 67.0 in

## 2020-11-10 DIAGNOSIS — F322 Major depressive disorder, single episode, severe without psychotic features: Secondary | ICD-10-CM | POA: Diagnosis not present

## 2020-11-10 DIAGNOSIS — F439 Reaction to severe stress, unspecified: Secondary | ICD-10-CM | POA: Diagnosis not present

## 2020-11-10 DIAGNOSIS — Z1231 Encounter for screening mammogram for malignant neoplasm of breast: Secondary | ICD-10-CM

## 2020-11-10 DIAGNOSIS — I10 Essential (primary) hypertension: Secondary | ICD-10-CM | POA: Diagnosis not present

## 2020-11-10 DIAGNOSIS — K644 Residual hemorrhoidal skin tags: Secondary | ICD-10-CM

## 2020-11-10 DIAGNOSIS — E663 Overweight: Secondary | ICD-10-CM

## 2020-11-10 DIAGNOSIS — K649 Unspecified hemorrhoids: Secondary | ICD-10-CM | POA: Insufficient documentation

## 2020-11-10 NOTE — Assessment & Plan Note (Addendum)
Refer to GI for 2nd opinion,per pt request,  symptomatic intermittently

## 2020-11-10 NOTE — Progress Notes (Signed)
[facility]  Virtual Visit via Telephone Note  I connected with Tiffany Mendoza on 11/10/20 at 11:00 AM EDT by telephone and verified that I am speaking with the correct person using two identifiers.  Location: Patient: home Provider: office   I discussed the limitations, risks, security and privacy concerns of performing an evaluation and management service by telephone and the availability of in person appointments. I also discussed with the patient that there may be a patient responsible charge related to this service. The patient expressed understanding and agreed to proceed.   History of Present Illness: Reports that she is feeling better overall as far as caring for her Mom , has had 2 different  counseling sessions and does not feel she needs additional therapy at this time. States will need some later for herself. Has a new routine worth stretching exercises, meditation and a healthy meal to start. Getting palliative care for her Mom and is working on getting her Mom placed, states she still has little support from her family. States brother in law has been helping as far as taking care of the car and he is taking care of the [place] application Sees chiropractor regularly, states chiropractor did x ray, in March estates she was told that caregiving causes spasm and back pain, this is being adequately managed by chiropractor In feb was stretching and felt a strain with the reach, took one ibuprofen which had expired, felt a weakness on left , so she threw away the ibuprofen Also c/o left neck pain intermittently    Observations/Objective: Ht 5\' 7"  (1.702 m)   BMI 28.58 kg/m  Good communication with no confusion and intact memory. Alert and oriented x 3 No signs of respiratory distress during speech   Assessment and Plan:  External hemorrhoids Wants 2nd opinion on hemmorhoids, refer locally  Hemorrhoids Refer to GI for 2nd opinion,per pt request,  symptomatic  intermittently  Essential hypertension DASH diet and commitment to daily physical activity for a minimum of 30 minutes discussed and encouraged, as a part of hypertension management. The importance of attaining a healthy weight is also discussed.  BP/Weight 11/10/2020 05/07/2020 04/01/2020 01/02/2020 07/11/2019 05/27/2019 12/14/9483  Systolic BP - 462 703 500 938 182 993  Diastolic BP - 84 80 80 80 80 80  Wt. (Lbs) - 182.5 177 177 177 177 177  BMI 28.58 28.58 27.72 27.72 27.72 27.72 27.72  will re eval in office in 5 months     Depression, major, single episode, severe ([place]) Improved , continue current medication  Overweight (BMI 25.0-29.9)  Patient re-educated about  the importance of commitment to a  minimum of 150 minutes of exercise per week as able.  The importance of healthy food choices with portion control discussed, as well as eating regularly and within a 12 hour window most days. The need to choose "clean , green" food 50 to 75% of the time is discussed, as well as to make water the primary drink and set a goal of 64 ounces water daily.    Weight /BMI 11/10/2020 05/07/2020 04/01/2020  WEIGHT - 182 lb 8 oz 177 lb  HEIGHT 5\' 7"  - 5\' 7"   BMI 28.58 kg/m2 28.58 kg/m2 27.72 kg/m2      Stress at home Increasing with deterioration in Mother's health, but has developed improved coping mechanisms   Follow Up Instructions:    I discussed the assessment and treatment plan with the patient. The patient was provided an opportunity to ask questions and  all were answered. The patient agreed with the plan and demonstrated an understanding of the instructions.   The patient was advised to call back or seek an in-person evaluation if the symptoms worsen or if the condition fails to improve as anticipated.  I provided 20 minutes of non-face-to-face time during this encounter.   Tula Nakayama, MD

## 2020-11-10 NOTE — Patient Instructions (Addendum)
F/U in office in 6 months, with EKG at that visit  call if you need me before  Nurse please send for most recent mammogram ( pt will let you know where) and enter , she is up to date, also remind her to get covid booster #2, thanks!   Thankful that you are generally doing better , working with what you have  Recent labs are absolutely perfect, which is wonderful!  No changes in your medication.  You are referred to GI re hemorrhoids as we discussed  Keep up your great schedule caring for mind, body and soul daily and taking naps!  Please get your covid booster , this is past due    [email or web address]">  DASH Eating Plan DASH stands for Dietary Approaches to Stop Hypertension. The DASH eating plan is a healthy eating plan that has been shown to:  Reduce high blood pressure (hypertension).  Reduce your risk for type 2 diabetes, heart disease, and stroke.  Help with weight loss. What are tips for following this plan? Reading food labels  Check food labels for the amount of salt (sodium) per serving. Choose foods with less than 5 percent of the Daily Value of sodium. Generally, foods with less than 300 milligrams (mg) of sodium per serving fit into this eating plan.  To find whole grains, look for the word "whole" as the first word in the ingredient list. Shopping  Buy products labeled as "low-sodium" or "no salt added."  Buy fresh foods. Avoid canned foods and pre-made or frozen meals. Cooking  Avoid adding salt when cooking. Use salt-free seasonings or herbs instead of table salt or sea salt. Check with your health care provider or pharmacist before using salt substitutes.  Do not fry foods. Cook foods using healthy methods such as baking, boiling, grilling, roasting, and broiling instead.  Cook with heart-healthy oils, such as olive, canola, avocado, soybean, or sunflower oil. Meal planning  Eat a balanced diet that  includes: ? 4 or more servings of fruits and 4 or more servings of vegetables each day. Try to fill one-half of your plate with fruits and vegetables. ? 6-8 servings of whole grains each day. ? Less than 6 oz (170 g) of lean meat, poultry, or fish each day. A 3-oz (85-g) serving of meat is about the same size as a deck of cards. One egg equals 1 oz (28 g). ? 2-3 servings of low-fat dairy each day. One serving is 1 cup (237 mL). ? 1 serving of nuts, seeds, or beans 5 times each week. ? 2-3 servings of heart-healthy fats. Healthy fats called omega-3 fatty acids are found in foods such as walnuts, flaxseeds, fortified milks, and eggs. These fats are also found in cold-water fish, such as sardines, salmon, and mackerel.  Limit how much you eat of: ? Canned or prepackaged foods. ? Food that is high in trans fat, such as some fried foods. ? Food that is high in saturated fat, such as fatty meat. ? Desserts and other sweets, sugary drinks, and other foods with added sugar. ? Full-fat dairy products.  Do not salt foods before eating.  Do not eat more than 4 egg yolks a week.  Try to eat at least 2 vegetarian meals a week.  Eat more home-cooked food and less restaurant, buffet, and fast food.   Lifestyle  When eating at a restaurant, ask that your food be prepared with less salt or no salt, if possible.  If you drink alcohol: ?  Limit how much you use to:  0-1 drink a day for women who are not pregnant.  0-2 drinks a day for men. ? Be aware of how much alcohol is in your drink. In the [place] one drink equals one 12 oz bottle of beer (355 mL), one 5 oz glass of wine (148 mL), or one 1 oz glass of hard liquor (44 mL). General information  Avoid eating more than 2,300 mg of salt a day. If you have hypertension, you may need to reduce your sodium intake to 1,500 mg a day.  Work with your health care provider to maintain a healthy body weight or to lose weight. Ask what an ideal weight is for  you.  Get at least 30 minutes of exercise that causes your heart to beat faster (aerobic exercise) most days of the week. Activities may include walking, swimming, or biking.  Work with your health care provider or dietitian to adjust your eating plan to your individual calorie needs. What foods should I eat? Fruits All fresh, dried, or frozen fruit. Canned fruit in natural juice (without added sugar). Vegetables Fresh or frozen vegetables (raw, steamed, roasted, or grilled). Low-sodium or reduced-sodium tomato and vegetable juice. Low-sodium or reduced-sodium tomato sauce and tomato paste. Low-sodium or reduced-sodium canned vegetables. Grains Whole-grain or whole-wheat bread. Whole-grain or whole-wheat pasta. Brown rice. [place]. Bulgur. Whole-grain and low-sodium cereals. Pita bread. Low-fat, low-sodium crackers. Whole-wheat flour tortillas. Meats and other proteins Skinless chicken or [place]. Ground chicken or [place]. Pork with fat trimmed off. Fish and seafood. Egg whites. Dried beans, peas, or lentils. Unsalted nuts, nut butters, and seeds. Unsalted canned beans. Lean cuts of beef with fat trimmed off. Low-sodium, lean precooked or cured meat, such as sausages or meat loaves. Dairy Low-fat (1%) or fat-free (skim) milk. Reduced-fat, low-fat, or fat-free cheeses. Nonfat, low-sodium ricotta or cottage cheese. Low-fat or nonfat yogurt. Low-fat, low-sodium cheese. Fats and oils Soft margarine without trans fats. Vegetable oil. Reduced-fat, low-fat, or light mayonnaise and salad dressings (reduced-sodium). Canola, safflower, olive, avocado, soybean, and sunflower oils. Avocado. Seasonings and condiments Herbs. Spices. Seasoning mixes without salt. Other foods Unsalted popcorn and pretzels. Fat-free sweets. The items listed above may not be a complete list of foods and beverages you can eat. Contact a dietitian for more information. What foods should I avoid? Fruits Canned fruit in a  light or heavy syrup. Fried fruit. Fruit in cream or butter sauce. Vegetables Creamed or fried vegetables. Vegetables in a cheese sauce. Regular canned vegetables (not low-sodium or reduced-sodium). Regular canned tomato sauce and paste (not low-sodium or reduced-sodium). Regular tomato and vegetable juice (not low-sodium or reduced-sodium). Angie Fava. Olives. Grains Baked goods made with fat, such as croissants, muffins, or some breads. Dry pasta or rice meal packs. Meats and other proteins Fatty cuts of meat. Ribs. Fried meat. Berniece Salines. Bologna, salami, and other precooked or cured meats, such as sausages or meat loaves. Fat from the back of a pig (fatback). Bratwurst. Salted nuts and seeds. Canned beans with added salt. Canned or smoked fish. Whole eggs or egg yolks. Chicken or [place] with skin. Dairy Whole or 2% milk, cream, and half-and-half. Whole or full-fat cream cheese. Whole-fat or sweetened yogurt. Full-fat cheese. Nondairy creamers. Whipped toppings. Processed cheese and cheese spreads. Fats and oils Butter. Stick margarine. Lard. Shortening. Ghee. Bacon fat. Tropical oils, such as coconut, palm kernel, or palm oil. Seasonings and condiments Onion salt, garlic salt, seasoned salt, table salt, and sea salt. Worcestershire sauce. Tartar  sauce. Barbecue sauce. Teriyaki sauce. Soy sauce, including reduced-sodium. Steak sauce. Canned and packaged gravies. Fish sauce. Oyster sauce. Cocktail sauce. Store-bought horseradish. Ketchup. Mustard. Meat flavorings and tenderizers. Bouillon cubes. Hot sauces. Pre-made or packaged marinades. Pre-made or packaged taco seasonings. Relishes. Regular salad dressings. Other foods Salted popcorn and pretzels. The items listed above may not be a complete list of foods and beverages you should avoid. Contact a dietitian for more information. Where to find more information  National Heart, Lung, and Blood Institute: https://wilson-eaton.com/  American Heart Association:  www.heart.org  Academy of Nutrition and Dietetics: www.eatright.[place]: www.kidney.org Summary  The DASH eating plan is a healthy eating plan that has been shown to reduce high blood pressure (hypertension). It may also reduce your risk for type 2 diabetes, heart disease, and stroke.  When on the DASH eating plan, aim to eat more fresh fruits and vegetables, whole grains, lean proteins, low-fat dairy, and heart-healthy fats.  With the DASH eating plan, you should limit salt (sodium) intake to 2,300 mg a day. If you have hypertension, you may need to reduce your sodium intake to 1,500 mg a day.  Work with your health care provider or dietitian to adjust your eating plan to your individual calorie needs. This information is not intended to replace advice given to you by your health care provider. Make sure you discuss any questions you have with your health care provider. Document Revised: 05/10/2019 Document Reviewed: 05/10/2019 Elsevier Patient Education  2021 [employer].

## 2020-11-10 NOTE — Assessment & Plan Note (Signed)
Wants 2nd opinion on hemmorhoids, refer locally

## 2020-11-11 ENCOUNTER — Encounter: Payer: Self-pay | Admitting: Family Medicine

## 2020-11-11 MED ORDER — CLORAZEPATE DIPOTASSIUM 7.5 MG PO TABS: 7.5000 mg | ORAL_TABLET | Freq: Every day | ORAL | 5 refills | Status: DC

## 2020-11-11 NOTE — Assessment & Plan Note (Signed)
Increasing with deterioration in Mother's health, but has developed improved coping mechanisms

## 2020-11-11 NOTE — Assessment & Plan Note (Signed)
Improved , continue current medication

## 2020-11-11 NOTE — Assessment & Plan Note (Signed)
  Patient re-educated about  the importance of commitment to a  minimum of 150 minutes of exercise per week as able.  The importance of healthy food choices with portion control discussed, as well as eating regularly and within a 12 hour window most days. The need to choose "clean , green" food 50 to 75% of the time is discussed, as well as to make water the primary drink and set a goal of 64 ounces water daily.    Weight /BMI 11/10/2020 05/07/2020 04/01/2020  WEIGHT - 182 lb 8 oz 177 lb  HEIGHT 5\' 7"  - 5\' 7"   BMI 28.58 kg/m2 28.58 kg/m2 27.72 kg/m2

## 2020-11-11 NOTE — Assessment & Plan Note (Signed)
DASH diet and commitment to daily physical activity for a minimum of 30 minutes discussed and encouraged, as a part of hypertension management. The importance of attaining a healthy weight is also discussed.  BP/Weight 11/10/2020 05/07/2020 04/01/2020 01/02/2020 07/11/2019 05/27/2019 46/65/9935  Systolic BP - 701 779 390 300 923 300  Diastolic BP - 84 80 80 80 80 80  Wt. (Lbs) - 182.5 177 177 177 177 177  BMI 28.58 28.58 27.72 27.72 27.72 27.72 27.72  will re eval in office in 5 months

## 2020-11-15 ENCOUNTER — Other Ambulatory Visit: Payer: Self-pay | Admitting: Family Medicine

## 2020-12-16 ENCOUNTER — Ambulatory Visit (INDEPENDENT_AMBULATORY_CARE_PROVIDER_SITE_OTHER): Payer: Medicare Other

## 2020-12-16 ENCOUNTER — Other Ambulatory Visit: Payer: Self-pay

## 2020-12-16 VITALS — Ht 67.0 in

## 2020-12-16 DIAGNOSIS — Z0000 Encounter for general adult medical examination without abnormal findings: Secondary | ICD-10-CM | POA: Diagnosis not present

## 2020-12-16 NOTE — Progress Notes (Addendum)
Subjective:   Tiffany Mendoza is a 70 y.o. female who presents for Medicare Annual (Subsequent) preventive examination.  Review of Systems        Objective:    There were no vitals filed for this visit. There is no height or weight on file to calculate BMI.  Advanced Directives 05/27/2019 05/15/2019 05/02/2018 10/20/2016 08/31/2016 04/22/2016 10/21/2015  Does Patient Have a Medical Advance Directive? Yes No Yes Yes No;Yes Yes Yes  Type of Advance Directive - - Living will;Healthcare Power of [place];Living will [place];Living will [place];Living will [place];Living will  Does patient want to make changes to medical advance directive? - - - - No - Patient declined No - Patient declined -  Copy of [place] in Chart? - - No - copy requested No - copy requested No - copy requested No - copy requested No - copy requested  Would patient like information on creating a medical advance directive? - No - Patient declined - - - - -    Current Medications (verified) Outpatient Encounter Medications as of 12/16/2020  Medication Sig   Apoaequorin (PREVAGEN EXTRA STRENGTH PO) Take 1 each by mouth daily.   Cholecalciferol (VITAMIN D3 PO) Take 5,000 Units by mouth daily.   clorazepate (TRANXENE-T) 7.5 MG tablet Take 1 tablet (7.5 mg total) by mouth at bedtime.   hydrochlorothiazide (HYDRODIURIL) 25 MG tablet Take 1 tablet (25 mg total) by mouth daily.   Omega-3 Fatty Acids (FISH OIL) 1000 MG CAPS Take by mouth.   Polyethylene Glycol 3350 (MIRALAX PO) Take by mouth daily as needed.   Potassium 99 MG TABS Take by mouth daily.   PSYLLIUM HUSK PO Take by mouth. 2 capsules daily   sertraline (ZOLOFT) 50 MG tablet TAKE 1 & 1/2 (ONE & ONE-HALF) TABLETS BY MOUTH ONCE DAILY   TURMERIC PO Take 1 Dose by mouth daily as needed (supplement).   No facility-administered encounter medications on file as of  12/16/2020.    Allergies (verified) Patient has no known allergies.   History: Past Medical History:  Diagnosis Date   Adjustment disorder with mixed anxiety and depressed mood    Allergy    Carotid bruit    Depression    Dermatophytosis of foot    GERD (gastroesophageal reflux disease)    Hypertension    Insomnia    Neck pain    Osteoporosis    Past Surgical History:  Procedure Laterality Date   ABDOMINAL HYSTERECTOMY  1988   HEMORRHOID SURGERY     myoectomy  1984   Family History  Problem Relation Age of Onset   Colon polyps Father    Prostate cancer Father    ALS Father    Thyroid disease Sister    Thyroid nodules Sister        Thyroidectomy in 1984   Memory loss Mother    Fibroids Sister    Hypertension Sister    Liver cancer Paternal Grandmother    Social History   Socioeconomic History   Marital status: Single    Spouse name: Not on file   Number of children: Not on file   Years of education: 12+   Highest education level: 12th grade  Occupational History   Not on file  Tobacco Use   Smoking status: Never   Smokeless tobacco: Never  Vaping Use   Vaping Use: Never used  Substance and Sexual Activity  Alcohol use: Yes    Comment: occasional wine   Drug use: No   Sexual activity: Not Currently  Other Topics Concern   Not on file  Social History Narrative   Lives alone with Mother who has dementia    Social Determinants of Health   Financial Resource Strain: Not on file  Food Insecurity: Not on file  Transportation Needs: Not on file  Physical Activity: Not on file  Stress: Stress Concern Present   Feeling of Stress : Rather much  Social Connections: Not on file    Tobacco Counseling Counseling given: Not Answered   Clinical Intake:                 Diabetic? no         Activities of Daily Living No flowsheet data found.  Patient Care Team: Fayrene Helper, MD as PCP - General Maisie Fus, MD as Consulting  Physician (Obstetrics and Gynecology) Rutherford Guys, MD as Consulting Physician (Ophthalmology)  Indicate any recent Medical Services you may have received from other than [facility] providers in the past year (date may be approximate).     Assessment:   This is a routine wellness examination for [place].  Hearing/Vision screen No results found.  Dietary issues and exercise activities discussed:     Goals Addressed   None   Depression Screen PHQ 2/9 Scores 11/10/2020 01/02/2020 05/27/2019 01/08/2019 01/08/2019 05/23/2018 05/01/2018  PHQ - 2 Score 1 2 4 6  0 5 3  PHQ- 9 Score - - 14 18 - 12 7  Exception Documentation - Other- indicate reason in comment box - - - - -  Not completed - would not answer the questions - - - - -    Fall Risk Fall Risk  11/10/2020 07/11/2019 05/27/2019 04/02/2019 01/08/2019  Falls in the past year? 0 0 0 0 0  Number falls in past yr: 0 0 0 0 -  Injury with Fall? 0 0 0 0 0  Follow up - - Falls evaluation completed;Education provided;Falls prevention discussed - -    FALL RISK PREVENTION PERTAINING TO THE HOME:  Any stairs in or around the home? No  If so, are there any without handrails?  N/a Home free of loose throw rugs in walkways, pet beds, electrical cords, etc? Yes  Adequate lighting in your home to reduce risk of falls? Yes   ASSISTIVE DEVICES UTILIZED TO PREVENT FALLS:  Life alert? No  Use of a cane, walker or w/c? No  Grab bars in the bathroom? No  Shower chair or bench in shower? No  Elevated toilet seat or a handicapped toilet? No   TIMED UP AND GO:  Was the test performed? No .  Length of time to ambulate n/a    Cognitive Function:     6CIT Screen 05/27/2019 05/23/2018 08/31/2016  What Year? 0 points 0 points 0 points  What month? 0 points 0 points 0 points  What time? 0 points 0 points 0 points  Count back from 20 0 points 0 points 0 points  Months in reverse 0 points 0 points 0 points  Repeat phrase 0 points 0 points 0 points  Total  Score 0 0 0    Immunizations Immunization History  Administered Date(s) Administered   Fluad Quad(high Dose 65+) 04/02/2019, 04/15/2020   Influenza Split 04/18/2011, 08/09/2012   Influenza Whole 04/29/2008, 04/15/2010   Influenza, High Dose Seasonal PF 04/26/2018   Influenza,inj,Quad PF,6+ Mos 03/18/2014, 04/28/2015, 05/18/2016, 05/24/2017  Moderna Sars-Covid-2 Vaccination 08/24/2019, 09/25/2019, 04/13/2020   PPD Test 08/09/2012   Pneumococcal Conjugate-13 08/14/2015   Pneumococcal Polysaccharide-23 09/17/2013   Td 08/14/2007   Tdap 01/01/2019   Zoster, Live 10/01/2014    TDAP status: Up to date  Flu Vaccine status: Up to date  Pneumococcal vaccine status: Up to date  Covid-19 vaccine status: Completed vaccines  Qualifies for Shingles Vaccine? Yes   Zostavax completed No   Shingrix Completed?: No.    Education has been provided regarding the importance of this vaccine. Patient has been advised to call insurance company to determine out of pocket expense if they have not yet received this vaccine. Advised may also receive vaccine at local pharmacy or Health Dept. Verbalized acceptance and understanding.  Screening Tests Health Maintenance  Topic Date Due   Zoster Vaccines- Shingrix (1 of 2) Never done   COVID-19 Vaccine (4 - Booster for Moderna series) 07/14/2020   INFLUENZA VACCINE  01/18/2021   MAMMOGRAM  08/20/2021   COLONOSCOPY (Pts 45-84yrs Insurance coverage will need to be confirmed)  06/16/2027   TETANUS/TDAP  12/31/2028   DEXA SCAN  Completed   Hepatitis C Screening  Completed   PNA vac Low Risk Adult  Completed   HPV VACCINES  Aged Out    Health Maintenance  Health Maintenance Due  Topic Date Due   Zoster Vaccines- Shingrix (1 of 2) Never done   COVID-19 Vaccine (4 - Booster for Moderna series) 07/14/2020    Colorectal cancer screening: Type of screening: Colonoscopy. Completed 06/15/17. Repeat every 10 years  Mammogram status: Completed 3/08/21/19.  Repeat every year  Bone Density status: Completed 06/19/19. Results reflect: Bone density results: NORMAL. Repeat every 5 years.  Lung Cancer Screening: (Low Dose CT Chest recommended if Age 81-80 years, 30 pack-year currently smoking OR have quit w/in 15years.) does not qualify.   Lung Cancer Screening Referral: n/a  Additional Screening:  Hepatitis C Screening: does not qualify; Completed  Vision Screening: Recommended annual ophthalmology exams for early detection of glaucoma and other disorders of the eye. Is the patient up to date with their annual eye exam?  Yes  Who is the provider or what is the name of the office in which the patient attends annual eye exams? Dr. Gershon Crane If pt is not established with a provider, would they like to be referred to a provider to establish care?  N/a .   Dental Screening: Recommended annual dental exams for proper oral hygiene  Community Resource Referral / Chronic Care Management: CRR required this visit?  No   CCM required this visit?  No      Plan:     I have personally reviewed and noted the following in the patient's chart:   Medical and social history Use of alcohol, tobacco or illicit drugs  Current medications and supplements including opioid prescriptions.  Functional ability and status Nutritional status Physical activity Advanced directives List of other physicians Hospitalizations, surgeries, and ER visits in previous 12 months Vitals Screenings to include cognitive, depression, and falls Referrals and appointments  In addition, I have reviewed and discussed with patient certain preventive protocols, quality metrics, and best practice recommendations. A written personalized care plan for preventive services as well as general preventive health recommendations were provided to patient.     Laretta Bolster, [place]   7/85/8850   Nurse Notes: AWV conducted by nurse in office by phone. Patient gave consent to telehealth visit  via audio. Patient at home at the time  of this visit. Provider here in the office at the time of this visit. Visit took 40 minutes to complete.   I have reviewed and agree with the above AWV documentation. Norwood Levo. Moshe Cipro, MD [facility] December 17, 2020 , 04:24 AM

## 2020-12-16 NOTE — Patient Instructions (Addendum)
Tiffany Mendoza , Thank you for taking time to come for your Medicare Wellness Visit. I appreciate your ongoing commitment to your health goals. Please review the following plan we discussed and let me know if I can assist you in the future.   Screening recommendations/referrals: Colonoscopy: 06/16/27 Mammogram: Scheduled for July Bone Density: Complete Recommended yearly ophthalmology/optometry visit for glaucoma screening and checkup Recommended yearly dental visit for hygiene and checkup  Vaccinations: Influenza vaccine: Fall 2022 Pneumococcal vaccine: Complete Tdap vaccine: 12/31/28 Shingles vaccine: Declined    Advanced directives: None  Conditions/risks identified: No  Next appointment: 05/17/21 @ 10 am. AWV in 1 year.   Preventive Care 17 Years and Older, Female Preventive care refers to lifestyle choices and visits with your health care provider that can promote health and wellness. What does preventive care include? A yearly physical exam. This is also called an annual well check. Dental exams once or twice a year. Routine eye exams. Ask your health care provider how often you should have your eyes checked. Personal lifestyle choices, including: Daily care of your teeth and gums. Regular physical activity. Eating a healthy diet. Avoiding tobacco and drug use. Limiting alcohol use. Practicing safe sex. Taking low-dose aspirin every day. Taking vitamin and mineral supplements as recommended by your health care provider. What happens during an annual well check? The services and screenings done by your health care provider during your annual well check will depend on your age, overall health, lifestyle risk factors, and family history of disease. Counseling  Your health care provider may ask you questions about your: Alcohol use. Tobacco use. Drug use. Emotional well-being. Home and relationship well-being. Sexual activity. Eating habits. History of falls. Memory  and ability to understand (cognition). Work and work Statistician. Reproductive health. Screening  You may have the following tests or measurements: Height, weight, and BMI. Blood pressure. Lipid and cholesterol levels. These may be checked every 5 years, or more frequently if you are over 19 years old. Skin check. Lung cancer screening. You may have this screening every year starting at age 35 if you have a 30-pack-year history of smoking and currently smoke or have quit within the past 15 years. Fecal occult blood test (FOBT) of the stool. You may have this test every year starting at age 85. Flexible sigmoidoscopy or colonoscopy. You may have a sigmoidoscopy every 5 years or a colonoscopy every 10 years starting at age 81. Hepatitis C blood test. Hepatitis B blood test. Sexually transmitted disease (STD) testing. Diabetes screening. This is done by checking your blood sugar (glucose) after you have not eaten for a while (fasting). You may have this done every 1-3 years. Bone density scan. This is done to screen for osteoporosis. You may have this done starting at age 71. Mammogram. This may be done every 1-2 years. Talk to your health care provider about how often you should have regular mammograms. Talk with your health care provider about your test results, treatment options, and if necessary, the need for more tests. Vaccines  Your health care provider may recommend certain vaccines, such as: Influenza vaccine. This is recommended every year. Tetanus, diphtheria, and acellular pertussis (Tdap, Td) vaccine. You may need a Td booster every 10 years. Zoster vaccine. You may need this after age 23. Pneumococcal 13-valent conjugate (PCV13) vaccine. One dose is recommended after age 77. Pneumococcal polysaccharide (PPSV23) vaccine. One dose is recommended after age 51. Talk to your health care provider about which screenings and vaccines you need  and how often you need them. This  information is not intended to replace advice given to you by your health care provider. Make sure you discuss any questions you have with your health care provider. Document Released: 07/03/2015 Document Revised: 02/24/2016 Document Reviewed: 04/07/2015 Elsevier Interactive Patient Education  2017 [employer] Prevention in the Home Falls can cause injuries. They can happen to people of all ages. There are many things you can do to make your home safe and to help prevent falls. What can I do on the outside of my home? Regularly fix the edges of walkways and driveways and fix any cracks. Remove anything that might make you trip as you walk through a door, such as a raised step or threshold. Trim any bushes or trees on the path to your home. Use bright outdoor lighting. Clear any walking paths of anything that might make someone trip, such as rocks or tools. Regularly check to see if handrails are loose or broken. Make sure that both sides of any steps have handrails. Any raised decks and porches should have guardrails on the edges. Have any leaves, snow, or ice cleared regularly. Use sand or salt on walking paths during winter. Clean up any spills in your garage right away. This includes oil or grease spills. What can I do in the bathroom? Use night lights. Install grab bars by the toilet and in the tub and shower. Do not use towel bars as grab bars. Use non-skid mats or decals in the tub or shower. If you need to sit down in the shower, use a plastic, non-slip stool. Keep the floor dry. Clean up any water that spills on the floor as soon as it happens. Remove soap buildup in the tub or shower regularly. Attach bath mats securely with double-sided non-slip rug tape. Do not have throw rugs and other things on the floor that can make you trip. What can I do in the bedroom? Use night lights. Make sure that you have a light by your bed that is easy to reach. Do not use any sheets or  blankets that are too big for your bed. They should not hang down onto the floor. Have a firm chair that has side arms. You can use this for support while you get dressed. Do not have throw rugs and other things on the floor that can make you trip. What can I do in the kitchen? Clean up any spills right away. Avoid walking on wet floors. Keep items that you use a lot in easy-to-reach places. If you need to reach something above you, use a strong step stool that has a grab bar. Keep electrical cords out of the way. Do not use floor polish or wax that makes floors slippery. If you must use wax, use non-skid floor wax. Do not have throw rugs and other things on the floor that can make you trip. What can I do with my stairs? Do not leave any items on the stairs. Make sure that there are handrails on both sides of the stairs and use them. Fix handrails that are broken or loose. Make sure that handrails are as long as the stairways. Check any carpeting to make sure that it is firmly attached to the stairs. Fix any carpet that is loose or worn. Avoid having throw rugs at the top or bottom of the stairs. If you do have throw rugs, attach them to the floor with carpet tape. Make sure that you  have a light switch at the top of the stairs and the bottom of the stairs. If you do not have them, ask someone to add them for you. What else can I do to help prevent falls? Wear shoes that: Do not have high heels. Have rubber bottoms. Are comfortable and fit you well. Are closed at the toe. Do not wear sandals. If you use a stepladder: Make sure that it is fully opened. Do not climb a closed stepladder. Make sure that both sides of the stepladder are locked into place. Ask someone to hold it for you, if possible. Clearly mark and make sure that you can see: Any grab bars or handrails. First and last steps. Where the edge of each step is. Use tools that help you move around (mobility aids) if they are  needed. These include: Canes. Walkers. Scooters. Crutches. Turn on the lights when you go into a dark area. Replace any light bulbs as soon as they burn out. Set up your furniture so you have a clear path. Avoid moving your furniture around. If any of your floors are uneven, fix them. If there are any pets around you, be aware of where they are. Review your medicines with your doctor. Some medicines can make you feel dizzy. This can increase your chance of falling. Ask your doctor what other things that you can do to help prevent falls. This information is not intended to replace advice given to you by your health care provider. Make sure you discuss any questions you have with your health care provider. Document Released: 04/02/2009 Document Revised: 11/12/2015 Document Reviewed: 07/11/2014 Elsevier Interactive Patient Education  2017 [employer].

## 2020-12-17 ENCOUNTER — Encounter: Payer: Self-pay | Admitting: Family Medicine

## 2020-12-19 ENCOUNTER — Other Ambulatory Visit: Payer: Self-pay | Admitting: Family Medicine

## 2021-01-24 ENCOUNTER — Encounter: Payer: Self-pay | Admitting: Family Medicine

## 2021-01-31 ENCOUNTER — Other Ambulatory Visit: Payer: Self-pay | Admitting: Family Medicine

## 2021-03-16 ENCOUNTER — Other Ambulatory Visit: Payer: Self-pay | Admitting: Family Medicine

## 2021-04-26 ENCOUNTER — Other Ambulatory Visit: Payer: Self-pay | Admitting: Family Medicine

## 2021-05-03 ENCOUNTER — Other Ambulatory Visit: Payer: Self-pay | Admitting: Family Medicine

## 2021-05-05 ENCOUNTER — Other Ambulatory Visit (HOSPITAL_COMMUNITY): Payer: Medicare Other

## 2021-05-11 ENCOUNTER — Other Ambulatory Visit (HOSPITAL_COMMUNITY): Payer: Self-pay

## 2021-05-11 ENCOUNTER — Other Ambulatory Visit (HOSPITAL_COMMUNITY): Payer: Self-pay | Admitting: *Deleted

## 2021-05-11 DIAGNOSIS — R768 Other specified abnormal immunological findings in serum: Secondary | ICD-10-CM

## 2021-05-12 ENCOUNTER — Ambulatory Visit (HOSPITAL_COMMUNITY): Payer: Medicare Other | Admitting: Hematology

## 2021-05-12 ENCOUNTER — Inpatient Hospital Stay (HOSPITAL_COMMUNITY): Payer: Medicare Other | Attending: Hematology

## 2021-05-12 ENCOUNTER — Other Ambulatory Visit: Payer: Self-pay

## 2021-05-12 DIAGNOSIS — Z8379 Family history of other diseases of the digestive system: Secondary | ICD-10-CM | POA: Diagnosis not present

## 2021-05-12 DIAGNOSIS — Z8371 Family history of colonic polyps: Secondary | ICD-10-CM | POA: Insufficient documentation

## 2021-05-12 DIAGNOSIS — Z8349 Family history of other endocrine, nutritional and metabolic diseases: Secondary | ICD-10-CM | POA: Diagnosis not present

## 2021-05-12 DIAGNOSIS — Z818 Family history of other mental and behavioral disorders: Secondary | ICD-10-CM | POA: Diagnosis not present

## 2021-05-12 DIAGNOSIS — R768 Other specified abnormal immunological findings in serum: Secondary | ICD-10-CM | POA: Diagnosis present

## 2021-05-12 DIAGNOSIS — Z8249 Family history of ischemic heart disease and other diseases of the circulatory system: Secondary | ICD-10-CM | POA: Insufficient documentation

## 2021-05-12 DIAGNOSIS — Z79899 Other long term (current) drug therapy: Secondary | ICD-10-CM | POA: Diagnosis not present

## 2021-05-12 DIAGNOSIS — Z8042 Family history of malignant neoplasm of prostate: Secondary | ICD-10-CM | POA: Diagnosis not present

## 2021-05-12 LAB — COMPREHENSIVE METABOLIC PANEL
ALT: 20 U/L (ref 0–44)
AST: 20 U/L (ref 15–41)
Albumin: 4.1 g/dL (ref 3.5–5.0)
Alkaline Phosphatase: 46 U/L (ref 38–126)
Anion gap: 8 (ref 5–15)
BUN: 11 mg/dL (ref 8–23)
CO2: 28 mmol/L (ref 22–32)
Calcium: 9.2 mg/dL (ref 8.9–10.3)
Chloride: 102 mmol/L (ref 98–111)
Creatinine, Ser: 0.78 mg/dL (ref 0.44–1.00)
GFR, Estimated: >60 mL/min (ref >60)
Glucose, Bld: 107 mg/dL — ABNORMAL HIGH (ref 70–99)
Potassium: 3.6 mmol/L (ref 3.5–5.1)
Sodium: 138 mmol/L (ref 135–145)
Total Bilirubin: 0.6 mg/dL (ref 0.3–1.2)
Total Protein: 7.3 g/dL (ref 6.5–8.1)

## 2021-05-12 LAB — CBC WITH DIFFERENTIAL/PLATELET
Abs Immature Granulocytes: 0.02 10*3/uL (ref 0.00–0.07)
Basophils Absolute: 0 10*3/uL (ref 0.0–0.1)
Basophils Relative: 1 %
Eosinophils Absolute: 0.1 10*3/uL (ref 0.0–0.5)
Eosinophils Relative: 2 %
HCT: 39.6 % (ref 36.0–46.0)
Hemoglobin: 13.2 g/dL (ref 12.0–15.0)
Immature Granulocytes: 0 %
Lymphocytes Relative: 37 %
Lymphs Abs: 1.8 10*3/uL (ref 0.7–4.0)
MCH: 31.4 pg (ref 26.0–34.0)
MCHC: 33.3 g/dL (ref 30.0–36.0)
MCV: 94.3 fL (ref 80.0–100.0)
Monocytes Absolute: 0.4 10*3/uL (ref 0.1–1.0)
Monocytes Relative: 8 %
Neutro Abs: 2.5 10*3/uL (ref 1.7–7.7)
Neutrophils Relative %: 52 %
Platelets: 234 10*3/uL (ref 150–400)
RBC: 4.2 MIL/uL (ref 3.87–5.11)
RDW: 12.2 % (ref 11.5–15.5)
WBC: 4.8 10*3/uL (ref 4.0–10.5)
nRBC: 0 % (ref 0.0–0.2)

## 2021-05-12 LAB — LACTATE DEHYDROGENASE: LDH: 147 U/L (ref 98–192)

## 2021-05-14 LAB — KAPPA/LAMBDA LIGHT CHAINS
Kappa free light chain: 24.8 mg/L — ABNORMAL HIGH (ref 3.3–19.4)
Kappa, lambda light chain ratio: 2.3 — ABNORMAL HIGH (ref 0.26–1.65)
Lambda free light chains: 10.8 mg/L (ref 5.7–26.3)

## 2021-05-17 ENCOUNTER — Ambulatory Visit: Payer: Medicare Other | Admitting: Family Medicine

## 2021-05-17 LAB — IMMUNOFIXATION ELECTROPHORESIS
IgA: 170 mg/dL (ref 64–422)
IgG (Immunoglobin G), Serum: 1316 mg/dL (ref 586–1602)
IgM (Immunoglobulin M), Srm: 41 mg/dL (ref 26–217)
Total Protein ELP: 7 g/dL (ref 6.0–8.5)

## 2021-05-17 LAB — PROTEIN ELECTROPHORESIS, SERUM
A/G Ratio: 1.2 (ref 0.7–1.7)
Albumin ELP: 3.8 g/dL (ref 2.9–4.4)
Alpha-1-Globulin: 0.2 g/dL (ref 0.0–0.4)
Alpha-2-Globulin: 0.8 g/dL (ref 0.4–1.0)
Beta Globulin: 0.9 g/dL (ref 0.7–1.3)
Gamma Globulin: 1.2 g/dL (ref 0.4–1.8)
Globulin, Total: 3.2 g/dL (ref 2.2–3.9)
Total Protein ELP: 7 g/dL (ref 6.0–8.5)

## 2021-05-19 NOTE — Progress Notes (Signed)
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, a 70 y.o. 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 Tiffany Mendoza was last seen on 05/07/2021.  Today she reports feeling good. She reports increased stress from taking care of her mother who has dementia. She denies fevers, infections, night sweats, and weight loss. She reports previous injury to her tailbone several years ago which occasionally causes pain while walking.   REVIEW OF SYSTEMS:  Review of Systems  Constitutional:  Negative for appetite change, fatigue, fever and unexpected weight change.  All other systems reviewed and are negative.  PAST MEDICAL/SURGICAL HISTORY:  Past Medical History:  Diagnosis Date   Adjustment disorder with mixed anxiety and depressed mood    Allergy    Carotid bruit    Depression    Dermatophytosis of foot    GERD (gastroesophageal reflux disease)    Hypertension    Insomnia    Neck pain    Osteoporosis    Past Surgical History:  Procedure Laterality Date   ABDOMINAL HYSTERECTOMY  1988   HEMORRHOID SURGERY     myoectomy  1984    SOCIAL HISTORY:  Social History   Socioeconomic History   Marital status: Single    Spouse name: Not on file   Number of children: Not on file   Years of education: 12+   Highest education level: 12th grade  Occupational History   Not on file  Tobacco Use   Smoking status: Never   Smokeless tobacco: Never  Vaping Use   Vaping Use: Never used  Substance and Sexual Activity   Alcohol use: Yes    Comment: occasional wine   Drug use: No   Sexual activity: Not Currently   Other Topics Concern   Not on file  Social History Narrative   Lives alone with Mother who has dementia    Social Determinants of Health   Financial Resource Strain: Low Risk    Difficulty of Paying Living Expenses: Not hard at all  Food Insecurity: No Food Insecurity   Worried About Charity fundraiser in the Last Year: Never true   Ran Out of Food in the Last Year: Never true  Transportation Needs: No Transportation Needs   Lack of Transportation (Medical): No   Lack of Transportation (Non-Medical): No  Physical Activity: Insufficiently Active   Days of Exercise per Week: 3 days   Minutes of Exercise per Session: 30 min  Stress: No Stress Concern Present   Feeling of Stress : Only a little  Social Connections: Socially Isolated   Frequency of Communication with Friends and Family: Twice a week   Frequency of Social Gatherings with Friends and Family: Never   Attends Religious Services: Never   Printmaker: No   Attends Music therapist: Never   Marital Status: Divorced  Human resources officer Violence: Not At Risk   Fear of Current or Ex-Partner: No   Emotionally Abused: No   Physically Abused: No   Sexually Abused: No    FAMILY HISTORY:  Family History  Problem Relation Age of Onset  Colon polyps Father    Prostate cancer Father    ALS Father    Thyroid disease Sister    Thyroid nodules Sister        Thyroidectomy in 1984   Memory loss Mother    Fibroids Sister    Hypertension Sister    Liver cancer Paternal Grandmother     CURRENT MEDICATIONS:  Current Outpatient Medications  Medication Sig Dispense Refill   Apoaequorin (PREVAGEN EXTRA STRENGTH PO) Take 1 each by mouth daily.     Cholecalciferol (VITAMIN D3 PO) Take 5,000 Units by mouth daily.     clorazepate (TRANXENE-T) 7.5 MG tablet Take 1 tablet (7.5 mg total) by mouth at bedtime. 30 tablet 5   hydrochlorothiazide (HYDRODIURIL) 25 MG tablet Take 1 tablet by mouth  once daily 90 tablet 0   Omega-3 Fatty Acids (FISH OIL) 1000 MG CAPS Take by mouth.     Polyethylene Glycol 3350 (MIRALAX PO) Take by mouth daily as needed.     Potassium 99 MG TABS Take by mouth daily.     PSYLLIUM HUSK PO Take by mouth. 1 capsule daily     sertraline (ZOLOFT) 50 MG tablet TAKE 1 & 1/2 (ONE & ONE-HALF) TABLETS BY MOUTH ONCE DAILY 45 tablet 0   TURMERIC PO Take 1 Dose by mouth daily as needed (supplement).     No current facility-administered medications for this visit.   Facility-Administered Medications Ordered in Other Visits  Medication Dose Route Frequency Provider Last Rate Last Admin   influenza vaccine adjuvanted (FLUAD) injection 0.5 mL  0.5 mL Intramuscular Once Derek Jack, MD        ALLERGIES:  No Known Allergies  PHYSICAL EXAM:  Performance status (ECOG): 0 - Asymptomatic  Vitals:   05/20/21 1514  BP: 128/81  Pulse: 80  Resp: 18  Temp: 98 F (36.7 C)  SpO2: 98%   Wt Readings from Last 3 Encounters:  05/20/21 180 lb (81.6 kg)  05/07/20 182 lb 8 oz (82.8 kg)  04/01/20 177 lb (80.3 kg)   Physical Exam Vitals reviewed.  Constitutional:      Appearance: Normal appearance.  Cardiovascular:     Rate and Rhythm: Normal rate and regular rhythm.     Pulses: Normal pulses.     Heart sounds: Normal heart sounds.  Pulmonary:     Effort: Pulmonary effort is normal.     Breath sounds: Normal breath sounds.  Neurological:     General: No focal deficit present.     Mental Status: She is alert and oriented to person, place, and time.  Psychiatric:        Mood and Affect: Mood normal.        Behavior: Behavior normal.    LABORATORY DATA:  I have reviewed the labs as listed.  CBC Latest Ref Rng & Units 05/12/2021 10/23/2020 09/30/2020  WBC 4.0 - 10.5 K/uL 4.8 4.9 5.6  Hemoglobin 12.0 - 15.0 g/dL 13.2 13.5 13.4  Hematocrit 36.0 - 46.0 % 39.6 41.0 39.6  Platelets 150 - 400 K/uL 234 231 248   CMP Latest Ref Rng & Units 05/12/2021 10/23/2020  09/30/2020  Glucose 70 - 99 mg/dL 107(H) 94 96  BUN 8 - 23 mg/dL 11 13 9   Creatinine 0.44 - 1.00 mg/dL 0.78 0.72 0.75  Sodium 135 - 145 mmol/L 138 137 142  Potassium 3.5 - 5.1 mmol/L 3.6 3.6 3.8  Chloride 98 - 111 mmol/L 102 103 102  CO2 22 - 32 mmol/L 28 28  22  Calcium 8.9 - 10.3 mg/dL 9.2 9.5 9.8  Total Protein 6.5 - 8.1 g/dL 7.3 7.6 7.4  Total Bilirubin 0.3 - 1.2 mg/dL 0.6 0.5 0.6  Alkaline Phos 38 - 126 U/L 46 51 59  AST 15 - 41 U/L 20 19 19   ALT 0 - 44 U/L 20 19 14       Component Value Date/Time   RBC 4.20 05/12/2021 1431   MCV 94.3 05/12/2021 1431   MCV 91 09/30/2020 1605   MCH 31.4 05/12/2021 1431   MCHC 33.3 05/12/2021 1431   RDW 12.2 05/12/2021 1431   RDW 12.5 09/30/2020 1605   LYMPHSABS 1.8 05/12/2021 1431   LYMPHSABS 2.3 09/30/2020 1605   MONOABS 0.4 05/12/2021 1431   EOSABS 0.1 05/12/2021 1431   EOSABS 0.1 09/30/2020 1605   BASOSABS 0.0 05/12/2021 1431   BASOSABS 0.0 09/30/2020 1605    DIAGNOSTIC IMAGING:  I have independently reviewed the scans and discussed with the patient. No results found.   ASSESSMENT:  1.  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 -I reviewed her blood work from 05/08/2019.  Kappa light chains at 22.6 and ratio is 1.77.  CBC was normal.  SPEP was negative.  Creatinine was 0.8 and calcium 9.4. -Last skeletal survey on 10/20/2016 showed no new or enlarging suspicious lytic lesions.  Questionable subtle very small lucency in the proximal left humerus is stable. -Skeletal survey on 04/30/2020 with subcentimeter stable subtle indeterminate lucency in the proximal left humerus surgical neck area with no new or enlarging lytic lesions present.   PLAN:  1.  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  No recurrent infections. - Reviewed labs from 05/12/2021.  M spike is negative.   Immunofixation was normal. - Kappa light chains at 24.8 with ratio of 2.30. - CBC was normal.  Comprehensive metabolic panel shows calcium and creatinine normal.  No "crab" features. - She missed doing a skeletal survey.  I have recommended at least getting a x-ray of her left hip to follow-up on the lytic lesions.  She will stop by with radiology today. - RTC 1 year for follow-up.  We will repeat myeloma labs and skeletal survey at that time.  Orders placed this encounter:  No orders of the defined types were placed in this encounter.    Derek Jack, MD [place] [phone]   I, Thana Ates, am acting as a scribe for Dr. Derek Jack.  I, Derek Jack MD, have reviewed the above documentation for accuracy and completeness, and I agree with the above.

## 2021-05-20 ENCOUNTER — Inpatient Hospital Stay (HOSPITAL_COMMUNITY): Payer: Medicare Other

## 2021-05-20 ENCOUNTER — Other Ambulatory Visit (HOSPITAL_COMMUNITY): Payer: Self-pay | Admitting: Hematology

## 2021-05-20 ENCOUNTER — Ambulatory Visit (HOSPITAL_COMMUNITY)
Admission: RE | Admit: 2021-05-20 | Discharge: 2021-05-20 | Disposition: A | Discharge status: Home / Self Care | Payer: Medicare Other | Source: Ambulatory Visit | Attending: Hematology | Admitting: Hematology

## 2021-05-20 ENCOUNTER — Encounter (HOSPITAL_COMMUNITY): Payer: Self-pay

## 2021-05-20 ENCOUNTER — Other Ambulatory Visit: Payer: Self-pay

## 2021-05-20 ENCOUNTER — Encounter (HOSPITAL_COMMUNITY): Payer: Self-pay | Admitting: Hematology

## 2021-05-20 ENCOUNTER — Inpatient Hospital Stay (HOSPITAL_COMMUNITY): Payer: Medicare Other | Attending: Hematology | Admitting: Hematology

## 2021-05-20 VITALS — BP 128/81 | HR 80 | Temp 98.0°F | Resp 18 | Ht 66.93 in | Wt 180.0 lb

## 2021-05-20 DIAGNOSIS — I10 Essential (primary) hypertension: Secondary | ICD-10-CM | POA: Insufficient documentation

## 2021-05-20 DIAGNOSIS — Z8719 Personal history of other diseases of the digestive system: Secondary | ICD-10-CM | POA: Diagnosis not present

## 2021-05-20 DIAGNOSIS — F0390 Unspecified dementia without behavioral disturbance: Secondary | ICD-10-CM | POA: Diagnosis not present

## 2021-05-20 DIAGNOSIS — Z79899 Other long term (current) drug therapy: Secondary | ICD-10-CM | POA: Diagnosis not present

## 2021-05-20 DIAGNOSIS — Z23 Encounter for immunization: Secondary | ICD-10-CM

## 2021-05-20 DIAGNOSIS — Z800 Family history of malignant neoplasm of digestive organs: Secondary | ICD-10-CM | POA: Diagnosis not present

## 2021-05-20 DIAGNOSIS — Z818 Family history of other mental and behavioral disorders: Secondary | ICD-10-CM | POA: Diagnosis not present

## 2021-05-20 DIAGNOSIS — R768 Other specified abnormal immunological findings in serum: Secondary | ICD-10-CM

## 2021-05-20 DIAGNOSIS — Z8371 Family history of colonic polyps: Secondary | ICD-10-CM | POA: Insufficient documentation

## 2021-05-20 DIAGNOSIS — Z8249 Family history of ischemic heart disease and other diseases of the circulatory system: Secondary | ICD-10-CM | POA: Insufficient documentation

## 2021-05-20 DIAGNOSIS — Z8349 Family history of other endocrine, nutritional and metabolic diseases: Secondary | ICD-10-CM | POA: Insufficient documentation

## 2021-05-20 DIAGNOSIS — Z8042 Family history of malignant neoplasm of prostate: Secondary | ICD-10-CM | POA: Diagnosis not present

## 2021-05-20 MED ORDER — INFLUENZA VAC A&B SA ADJ QUAD 0.5 ML IM PRSY
0.5000 mL | PREFILLED_SYRINGE | Freq: Once | INTRAMUSCULAR | Status: AC
  Administered 2021-05-20: 0.5 mL via INTRAMUSCULAR
  Filled 2021-05-20: qty 0.5

## 2021-05-20 NOTE — Patient Instructions (Signed)
Tiffany Mendoza at [facility] Discharge Instructions  You were seen and examined today by Dr. Delton Coombes. He reviewed your most recent labs and everything looks okay. Please keep follow up appointment as scheduled in one year.   Thank you for choosing [place] at [facility] to provide your oncology and hematology care.  To afford each patient quality time with our provider, please arrive at least 15 minutes before your scheduled appointment time.   If you have a lab appointment with the [facility] please come in thru the Main Entrance and check in at the main information desk.  You need to re-schedule your appointment should you arrive 10 or more minutes late.  We strive to give you quality time with our providers, and arriving late affects you and other patients whose appointments are after yours.  Also, if you no show three or more times for appointments you may be dismissed from the clinic at the providers discretion.     Again, thank you for choosing [facility]  Our hope is that these requests will decrease the amount of time that you wait before being seen by our physicians.       _____________________________________________________________  Should you have questions after your visit to [facility], please contact our office at [phone] and follow the prompts.  Our office hours are 8:00 a.m. and 4:30 p.m. Monday - Friday.  Please note that voicemails left after 4:00 p.m. may not be returned until the following business day.  We are closed weekends and major holidays.  You do have access to a nurse 24-7, just call the main number to the clinic [phone] and do not press any options, hold on the line and a nurse will answer the phone.    For prescription refill requests, have your pharmacy contact our office and allow 72 hours.    Due to Covid, you will need to wear a mask upon entering the hospital. If you do  not have a mask, a mask will be given to you at the Main Entrance upon arrival. For doctor visits, patients may have 1 support person age 5 or older with them. For treatment visits, patients can not have anyone with them due to social distancing guidelines and our immunocompromised population.

## 2021-05-20 NOTE — Patient Instructions (Signed)
[facility]  Discharge Instructions: Thank you for choosing [facility] to provide your oncology and hematology care.  If you have a lab appointment with the [facility], please come in thru the Main Entrance and check in at the main information desk.  Wear comfortable clothing and clothing appropriate for easy access to any Portacath or PICC line.   We strive to give you quality time with your provider. You may need to reschedule your appointment if you arrive late (15 or more minutes).  Arriving late affects you and other patients whose appointments are after yours.  Also, if you miss three or more appointments without notifying the office, you may be dismissed from the clinic at the provider's discretion.      For prescription refill requests, have your pharmacy contact our office and allow 72 hours for refills to be completed.    Today you received the following Influenza vaccination, return as scheduled.   To help prevent nausea and vomiting after your treatment, we encourage you to take your nausea medication as directed.  BELOW ARE SYMPTOMS THAT SHOULD BE REPORTED IMMEDIATELY: *FEVER GREATER THAN 100.4 F (38 C) OR HIGHER *CHILLS OR SWEATING *NAUSEA AND VOMITING THAT IS NOT CONTROLLED WITH YOUR NAUSEA MEDICATION *UNUSUAL SHORTNESS OF BREATH *UNUSUAL BRUISING OR BLEEDING *URINARY PROBLEMS (pain or burning when urinating, or frequent urination) *BOWEL PROBLEMS (unusual diarrhea, constipation, pain near the anus) TENDERNESS IN MOUTH AND THROAT WITH OR WITHOUT PRESENCE OF ULCERS (sore throat, sores in mouth, or a toothache) UNUSUAL RASH, SWELLING OR PAIN  UNUSUAL VAGINAL DISCHARGE OR ITCHING   Items with * indicate a potential emergency and should be followed up as soon as possible or go to the Emergency Department if any problems should occur.  Please show the CHEMOTHERAPY ALERT CARD or IMMUNOTHERAPY ALERT CARD at check-in to the Emergency Department and  triage nurse.  Should you have questions after your visit or need to cancel or reschedule your appointment, please contact [facility] [phone]  and follow the prompts.  Office hours are 8:00 a.m. to 4:30 p.m. Monday - Friday. Please note that voicemails left after 4:00 p.m. may not be returned until the following business day.  We are closed weekends and major holidays. You have access to a nurse at all times for urgent questions. Please call the main number to the clinic [phone] and follow the prompts.  For any non-urgent questions, you may also contact your provider using MyChart. We now offer e-Visits for anyone 62 and older to request care online for non-urgent symptoms. For details visit [email or web address].   Also download the MyChart app! Go to the app store, search "MyChart", open the app, select [facility], and log in with your MyChart username and password.  Due to Covid, a mask is required upon entering the hospital/clinic. If you do not have a mask, one will be given to you upon arrival. For doctor visits, patients may have 1 support person aged 61 or older with them. For treatment visits, patients cannot have anyone with them due to current Covid guidelines and our immunocompromised population.

## 2021-05-20 NOTE — Progress Notes (Signed)
Patient tolerated injection with no complaints voiced. Site clean and dry with no bruising or swelling noted at site. See MAR for details. Band aid applied.  Patient stable during and after injection. VSS with discharge and left in satisfactory condition with no s/s of distress noted.  

## 2021-05-31 ENCOUNTER — Other Ambulatory Visit: Payer: Self-pay | Admitting: Family Medicine

## 2021-06-06 ENCOUNTER — Other Ambulatory Visit: Payer: Self-pay | Admitting: Family Medicine

## 2021-06-07 ENCOUNTER — Other Ambulatory Visit: Payer: Self-pay | Admitting: Family Medicine

## 2021-06-09 ENCOUNTER — Ambulatory Visit (INDEPENDENT_AMBULATORY_CARE_PROVIDER_SITE_OTHER): Payer: Medicare Other | Admitting: Family Medicine

## 2021-06-09 ENCOUNTER — Encounter: Payer: Self-pay | Admitting: Family Medicine

## 2021-06-09 ENCOUNTER — Other Ambulatory Visit: Payer: Self-pay

## 2021-06-09 VITALS — BP 155/83 | HR 70 | Resp 16 | Ht 67.0 in | Wt 176.0 lb

## 2021-06-09 DIAGNOSIS — I10 Essential (primary) hypertension: Secondary | ICD-10-CM

## 2021-06-09 DIAGNOSIS — K649 Unspecified hemorrhoids: Secondary | ICD-10-CM | POA: Diagnosis not present

## 2021-06-09 DIAGNOSIS — R079 Chest pain, unspecified: Secondary | ICD-10-CM

## 2021-06-09 DIAGNOSIS — F418 Other specified anxiety disorders: Secondary | ICD-10-CM

## 2021-06-09 NOTE — Patient Instructions (Addendum)
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

## 2021-06-13 ENCOUNTER — Encounter: Payer: Self-pay | Admitting: Family Medicine

## 2021-06-13 DIAGNOSIS — R079 Chest pain, unspecified: Secondary | ICD-10-CM | POA: Insufficient documentation

## 2021-06-13 MED ORDER — SERTRALINE HCL 50 MG PO TABS: ORAL_TABLET | ORAL | 5 refills | Status: DC

## 2021-06-13 NOTE — Assessment & Plan Note (Signed)
Reports anal pressure , wants to be "checked" , 2nd opinion , had banding in the past

## 2021-06-13 NOTE — Progress Notes (Signed)
° °  TANVI GATLING     MRN: [id number]      DOB: Jul 04, 1950   HPI Ms. Barnhard is here for follow up and re-evaluation of chronic medical conditions, medication management and review of any available recent lab and radiology data.  Preventive health is updated, specifically  Cancer screening and Immunization.   Questions or concerns regarding consultations or procedures which the PT has had in the interim are  addressed. The PT denies any adverse reactions to current medications since the last visit.  C/o fatigue and intermittent chest discomfort x 4 weeks ROS Denies recent fever or chills. Denies sinus pressure, nasal congestion, ear pain or sore throat. Denies chest congestion, productive cough or wheezing. Denies ,PND, orthopnea,  palpitations and leg swelling Denies abdominal pain, nausea, vomiting,diarrhea or constipation.   Denies dysuria, frequency, hesitancy or incontinence. Denies joint pain, swelling and limitation in mobility. Denies headaches, seizures, numbness, or tingling. Denies uncontrolled  depression, anxiety or insomnia. Denies skin break down or rash.   PE  BP (!) 155/83    Pulse 70    Resp 16    Ht 5\' 7"  (1.702 m)    Wt 176 lb (79.8 kg)    SpO2 96%    BMI 27.57 kg/m   Patient alert and oriented and in no cardiopulmonary distress.  HEENT: No facial asymmetry, EOMI,     Neck supple .  Chest: Clear to auscultation bilaterally.  CVS: S1, S2 no murmurs, no S3.Regular rate.  ABD: Soft non tender.   Ext: No edema  MS: Adequate ROM spine, shoulders, hips and knees.  Skin: Intact, no ulcerations or rash noted.  Psych: Good eye contact, normal affect. Memory intact not anxious or depressed appearing.  CNS: CN 2-12 intact, power,  normal throughout.no focal deficits noted.   Assessment & Plan  Essential hypertension Elevated at visit, non compliance reported, unitentional, tay " busy" DASH diet and commitment to daily physical activity for a minimum of  30 minutes discussed and encouraged, as a part of hypertension management. The importance of attaining a healthy weight is also discussed.  BP/Weight 06/09/2021 05/20/2021 12/16/2020 11/10/2020 05/07/2020 04/01/2020 9/75/3005  Systolic BP 110 211 - - 173 567 014  Diastolic BP 83 81 - - 84 80 80  Wt. (Lbs) 176 180 - - 182.5 177 177  BMI 27.57 28.25 28.58 28.58 28.58 27.72 27.72       Depression with anxiety Controlled, no change in medication   Chest pain Intermittent chest pain x 1 month with exertional fatigue and uncontrolled HTN EKG in office : NSR, no ischemia, no LVH

## 2021-06-13 NOTE — Assessment & Plan Note (Signed)
Intermittent chest pain x 1 month with exertional fatigue and uncontrolled HTN EKG in office : NSR, no ischemia, no LVH

## 2021-06-13 NOTE — Assessment & Plan Note (Signed)
Elevated at visit, non compliance reported, unitentional, tay " busy" DASH diet and commitment to daily physical activity for a minimum of 30 minutes discussed and encouraged, as a part of hypertension management. The importance of attaining a healthy weight is also discussed.  BP/Weight 06/09/2021 05/20/2021 12/16/2020 11/10/2020 05/07/2020 04/01/2020 7/92/1783  Systolic BP 754 237 - - 023 017 209  Diastolic BP 83 81 - - 84 80 80  Wt. (Lbs) 176 180 - - 182.5 177 177  BMI 27.57 28.25 28.58 28.58 28.58 27.72 27.72

## 2021-06-13 NOTE — Assessment & Plan Note (Signed)
Controlled, no change in medication  

## 2021-07-03 ENCOUNTER — Other Ambulatory Visit: Payer: Self-pay | Admitting: Family Medicine

## 2021-07-13 ENCOUNTER — Other Ambulatory Visit: Payer: Self-pay

## 2021-07-13 MED ORDER — CLORAZEPATE DIPOTASSIUM 7.5 MG PO TABS: 7.5000 mg | ORAL_TABLET | Freq: Every day | ORAL | 0 refills | Status: DC

## 2021-08-02 ENCOUNTER — Telehealth: Payer: Self-pay | Admitting: Gastroenterology

## 2021-08-02 NOTE — Telephone Encounter (Signed)
Pt came to the office today to request her medical records. She stated that she will have a second opinion at [facility] with Dr. Therisa Doyne. Release of information form was faxed to Med records. Form and confirmation fax were scanned into pt's chart and given to pt. Pt stated that it is not her intention to switch practices but needs some clarification on her IBS sxs that have worsened since last time that pt was seen. Pt stated that she tried to get clarification from [place] but it was hard to speak with the doctor unless she made an appt. Pt stated that someone at the front desk tried to clarify her questions, which she did not appreciate.

## 2021-08-08 ENCOUNTER — Other Ambulatory Visit: Payer: Self-pay | Admitting: Family Medicine

## 2021-08-10 ENCOUNTER — Other Ambulatory Visit: Payer: Self-pay | Admitting: Family Medicine

## 2021-08-12 ENCOUNTER — Other Ambulatory Visit: Payer: Self-pay | Admitting: Family Medicine

## 2021-09-07 ENCOUNTER — Other Ambulatory Visit: Payer: Self-pay | Admitting: Family Medicine

## 2021-09-12 ENCOUNTER — Other Ambulatory Visit: Payer: Self-pay

## 2021-09-12 ENCOUNTER — Encounter (HOSPITAL_COMMUNITY): Payer: Self-pay

## 2021-09-12 ENCOUNTER — Emergency Department (HOSPITAL_COMMUNITY): Payer: Medicare Other

## 2021-09-12 ENCOUNTER — Emergency Department (HOSPITAL_COMMUNITY)
Admission: EM | Admit: 2021-09-12 | Discharge: 2021-09-12 | Disposition: A | Discharge status: Home / Self Care | Payer: Medicare Other | Attending: Emergency Medicine | Admitting: Emergency Medicine

## 2021-09-12 DIAGNOSIS — R059 Cough, unspecified: Secondary | ICD-10-CM | POA: Diagnosis present

## 2021-09-12 DIAGNOSIS — R9431 Abnormal electrocardiogram [ECG] [EKG]: Secondary | ICD-10-CM | POA: Diagnosis not present

## 2021-09-12 DIAGNOSIS — E876 Hypokalemia: Secondary | ICD-10-CM | POA: Diagnosis not present

## 2021-09-12 DIAGNOSIS — Z79899 Other long term (current) drug therapy: Secondary | ICD-10-CM | POA: Insufficient documentation

## 2021-09-12 DIAGNOSIS — R000 Tachycardia, unspecified: Secondary | ICD-10-CM | POA: Insufficient documentation

## 2021-09-12 DIAGNOSIS — I10 Essential (primary) hypertension: Secondary | ICD-10-CM | POA: Insufficient documentation

## 2021-09-12 DIAGNOSIS — U071 COVID-19: Secondary | ICD-10-CM | POA: Diagnosis not present

## 2021-09-12 LAB — COMPREHENSIVE METABOLIC PANEL
ALT: 20 U/L (ref 0–44)
AST: 23 U/L (ref 15–41)
Albumin: 4.6 g/dL (ref 3.5–5.0)
Alkaline Phosphatase: 58 U/L (ref 38–126)
Anion gap: 10 (ref 5–15)
BUN: 7 mg/dL — ABNORMAL LOW (ref 8–23)
CO2: 26 mmol/L (ref 22–32)
Calcium: 9.3 mg/dL (ref 8.9–10.3)
Chloride: 100 mmol/L (ref 98–111)
Creatinine, Ser: 0.77 mg/dL (ref 0.44–1.00)
GFR, Estimated: >60 mL/min (ref >60)
Glucose, Bld: 116 mg/dL — ABNORMAL HIGH (ref 70–99)
Potassium: 3 mmol/L — ABNORMAL LOW (ref 3.5–5.1)
Sodium: 136 mmol/L (ref 135–145)
Total Bilirubin: 0.6 mg/dL (ref 0.3–1.2)
Total Protein: 8.4 g/dL — ABNORMAL HIGH (ref 6.5–8.1)

## 2021-09-12 LAB — CBC WITH DIFFERENTIAL/PLATELET
Abs Immature Granulocytes: 0.03 10*3/uL (ref 0.00–0.07)
Basophils Absolute: 0 10*3/uL (ref 0.0–0.1)
Basophils Relative: 0 %
Eosinophils Absolute: 0 10*3/uL (ref 0.0–0.5)
Eosinophils Relative: 0 %
HCT: 42.7 % (ref 36.0–46.0)
Hemoglobin: 14.1 g/dL (ref 12.0–15.0)
Immature Granulocytes: 0 %
Lymphocytes Relative: 6 %
Lymphs Abs: 0.6 10*3/uL — ABNORMAL LOW (ref 0.7–4.0)
MCH: 31.2 pg (ref 26.0–34.0)
MCHC: 33 g/dL (ref 30.0–36.0)
MCV: 94.5 fL (ref 80.0–100.0)
Monocytes Absolute: 0.6 10*3/uL (ref 0.1–1.0)
Monocytes Relative: 6 %
Neutro Abs: 8.3 10*3/uL — ABNORMAL HIGH (ref 1.7–7.7)
Neutrophils Relative %: 88 %
Platelets: 229 10*3/uL (ref 150–400)
RBC: 4.52 MIL/uL (ref 3.87–5.11)
RDW: 12.2 % (ref 11.5–15.5)
WBC: 9.6 10*3/uL (ref 4.0–10.5)
nRBC: 0 % (ref 0.0–0.2)

## 2021-09-12 LAB — PROTIME-INR
INR: 1 (ref 0.8–1.2)
Prothrombin Time: 13.3 seconds (ref 11.4–15.2)

## 2021-09-12 LAB — URINALYSIS, ROUTINE W REFLEX MICROSCOPIC
Bacteria, UA: NONE SEEN
Bilirubin Urine: NEGATIVE
Glucose, UA: NEGATIVE mg/dL
Ketones, ur: NEGATIVE mg/dL
Leukocytes,Ua: NEGATIVE
Nitrite: NEGATIVE
Protein, ur: NEGATIVE mg/dL
Specific Gravity, Urine: 1.008 (ref 1.005–1.030)
pH: 6 (ref 5.0–8.0)

## 2021-09-12 LAB — LACTIC ACID, PLASMA: Lactic Acid, Venous: 0.9 mmol/L (ref 0.5–1.9)

## 2021-09-12 LAB — RESP PANEL BY RT-PCR (FLU A&B, COVID) ARPGX2
Influenza A by PCR: NEGATIVE
Influenza B by PCR: NEGATIVE
SARS Coronavirus 2 by RT PCR: POSITIVE — AB

## 2021-09-12 LAB — TROPONIN I (HIGH SENSITIVITY): Troponin I (High Sensitivity): 3 ng/L (ref <18)

## 2021-09-12 LAB — APTT: aPTT: 31 seconds (ref 24–36)

## 2021-09-12 MED ORDER — ACETAMINOPHEN 325 MG PO TABS
650.0000 mg | ORAL_TABLET | Freq: Once | ORAL | Status: AC
  Administered 2021-09-12: 650 mg via ORAL
  Filled 2021-09-12: qty 2

## 2021-09-12 MED ORDER — POTASSIUM CHLORIDE CRYS ER 20 MEQ PO TBCR
40.0000 meq | EXTENDED_RELEASE_TABLET | Freq: Once | ORAL | Status: AC
  Administered 2021-09-12: 40 meq via ORAL
  Filled 2021-09-12: qty 2

## 2021-09-12 MED ORDER — POTASSIUM CHLORIDE CRYS ER 20 MEQ PO TBCR: 20.0000 meq | EXTENDED_RELEASE_TABLET | Freq: Every day | ORAL | 0 refills | Status: DC

## 2021-09-12 MED ORDER — NIRMATRELVIR/RITONAVIR (PAXLOVID)TABLET
3.0000 | ORAL_TABLET | Freq: Two times a day (BID) | ORAL | 0 refills | Status: AC
Start: 2021-09-12 — End: 2021-09-17

## 2021-09-12 MED ORDER — LACTATED RINGERS IV BOLUS
1000.0000 mL | Freq: Once | INTRAVENOUS | Status: AC
  Administered 2021-09-12: 1000 mL via INTRAVENOUS

## 2021-09-12 NOTE — ED Provider Notes (Signed)
?[facility] ?Provider Note ? ? ?CSN: [id number] ?Arrival date & time: 09/12/21  1559 ? ?  ? ?History ? ?Chief Complaint  ?Patient presents with  ? Cough  ? ? ?Tiffany Mendoza is a 71 y.o. female. ? ?HPI ?71 year old female with a history of chronic bronchitis, GERD, and hypertension presents with cough.  Felt like recurrent bronchitis yesterday with a mild cough which then progressed and worsened starting last night.  She felt like she might have a subjective fever but did not check temperature.  Took some Tylenol last night.  She is feeling all over achy and having a nonproductive cough.  Her throat hurts when she coughs.  No chest pain, shortness of breath.  No abdominal pain today. ? 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 This is chronic, maybe a little worse. ? ?Home Medications ?Prior to Admission medications   ?Medication Sig Start Date End Date Taking? Authorizing Provider  ?nirmatrelvir/ritonavir EUA (PAXLOVID) 20 x 150 MG & 10 x '100MG'$  TABS Take 3 tablets by mouth 2 (two) times daily for 5 days. Patient GFR is >60. Take nirmatrelvir (150 mg) two tablets twice daily for 5 days and ritonavir (100 mg) one tablet twice daily for 5 days. 09/12/21 09/17/21 Yes Sherwood Gambler, MD  ?potassium chloride SA (KLOR-CON M) 20 MEQ tablet Take 1 tablet (20 mEq total) by mouth daily. 09/12/21  Yes Sherwood Gambler, MD  ?Apoaequorin [facility] EXTRA STRENGTH PO) Take 1 each by mouth daily.    [provider]  ?Cholecalciferol (VITAMIN D3 PO) Take 5,000 Units by mouth daily.    [provider]  ?clorazepate (TRANXENE) 7.5 MG tablet TAKE 1 TABLET BY MOUTH AT BEDTIME 08/09/21   Fayrene Helper, MD  ?hydrochlorothiazide (HYDRODIURIL) 25 MG tablet Take 1 tablet by mouth once daily 08/12/21   Fayrene Helper, MD  ?Omega-3 Fatty Acids (FISH OIL) 1000 MG CAPS Take by mouth.    [provider]  ?Polyethylene Glycol 3350  (MIRALAX PO) Take by mouth daily as needed.    [provider]  ?Potassium 99 MG TABS Take by mouth daily.    [provider]  ?PSYLLIUM HUSK PO Take by mouth. 1 capsule daily    [provider]  ?sertraline (ZOLOFT) 50 MG tablet TAKE 1 & 1/2 (ONE & ONE-HALF) TABLETS BY MOUTH ONCE DAILY 06/07/21   Fayrene Helper, MD  ?sertraline (ZOLOFT) 50 MG tablet Take one and a half tablets by mouth once daily 06/13/21   Fayrene Helper, MD  ?TURMERIC PO Take 1 Dose by mouth daily as needed (supplement).    [provider]  ?   ? ?Allergies    ?Patient has no known allergies.   ? ?Review of Systems   ?Review of Systems  ?Constitutional:  Positive for fatigue and fever.  ?HENT:  Positive for sore throat.   ?Respiratory:  Positive for cough. Negative for shortness of breath.   ?Gastrointestinal:  Negative for abdominal pain and vomiting.  ?All other systems reviewed and are negative. ? ?Physical Exam ?Updated Vital Signs ?BP 133/76   Pulse 96   Temp 98.2 ?F (36.8 ?C) (Oral)   Resp 18   Ht '5\' 7"'$  (1.702 m)   Wt 77.1 kg   SpO2 98%   BMI 26.63 kg/m?  ?Physical Exam ?Vitals and nursing note reviewed.  ?Constitutional:   ?   Appearance: She is well-developed.  ?HENT:  ?  Head: Normocephalic and atraumatic.  ?Cardiovascular:  ?   Rate and Rhythm: Regular rhythm. Tachycardia present.  ?   Heart sounds: Normal heart sounds.  ?Pulmonary:  ?   Effort: Pulmonary effort is normal.  ?   Breath sounds: Normal breath sounds. No wheezing, rhonchi or rales.  ?Abdominal:  ?   General: There is no distension.  ?   Palpations: Abdomen is soft.  ?   Tenderness: There is no abdominal tenderness.  ?Skin: ?   General: Skin is warm and dry.  ?Neurological:  ?   Mental Status: She is alert.  ? ? ?ED Results / Procedures / Treatments   ?Labs ?(all labs ordered are listed, but only abnormal results are displayed) ?Labs Reviewed  ?RESP PANEL BY RT-PCR (FLU A&B, COVID) ARPGX2 - Abnormal; Notable for the  following components:  ?    Result Value  ? SARS Coronavirus 2 by RT PCR POSITIVE (*)   ? All other components within normal limits  ?COMPREHENSIVE METABOLIC PANEL - Abnormal; Notable for the following components:  ? Potassium 3.0 (*)   ? Glucose, Bld 116 (*)   ? BUN 7 (*)   ? Total Protein 8.4 (*)   ? All other components within normal limits  ?CBC WITH DIFFERENTIAL/PLATELET - Abnormal; Notable for the following components:  ? Neutro Abs 8.3 (*)   ? Lymphs Abs 0.6 (*)   ? All other components within normal limits  ?URINALYSIS, ROUTINE W REFLEX MICROSCOPIC - Abnormal; Notable for the following components:  ? Color, Urine STRAW (*)   ? Hgb urine dipstick MODERATE (*)   ? All other components within normal limits  ?CULTURE, BLOOD (ROUTINE X 2)  ?CULTURE, BLOOD (ROUTINE X 2)  ?URINE CULTURE  ?LACTIC ACID, PLASMA  ?PROTIME-INR  ?APTT  ?TROPONIN I (HIGH SENSITIVITY)  ? ? ?EKG ?EKG Interpretation ? ?Date/Time:  Sunday September 12 2021 17:15:32 EDT ?Ventricular Rate:  93 ?PR Interval:  154 ?QRS Duration: 90 ?QT Interval:  349 ?QTC Calculation: 435 ?R Axis:   48 ?Text Interpretation: Sinus rhythm Abnormal R-wave progression, early transition Borderline ST elevation, anterior leads anterior ST elevations V1-V2? were also present in 2022 Confirmed by Sherwood Gambler [phone]) on 09/12/2021 5:50:32 PM ? ?Radiology ?DG Chest 2 View ? ?Result Date: 09/12/2021 ?CLINICAL DATA:  Cough with chest congestion and weakness. EXAM: CHEST - 2 VIEW COMPARISON:  May 19, 2014 FINDINGS: The heart size and mediastinal contours are within normal limits. Both lungs are clear. The visualized skeletal structures are unremarkable. IMPRESSION: No active cardiopulmonary disease. Electronically Signed   By: Virgina Norfolk M.D.   On: 09/12/2021 16:52   ? ?Procedures ?Procedures  ? ? ?Medications Ordered in ED ?Medications  ?lactated ringers bolus 1,000 mL (0 mLs Intravenous Stopped 09/12/21 1811)  ?acetaminophen (TYLENOL) tablet 650 mg (650 mg Oral  Given 09/12/21 1701)  ?potassium chloride SA (KLOR-CON M) CR tablet 40 mEq (40 mEq Oral Given 09/12/21 1826)  ? ? ?ED Course/ Medical Decision Making/ A&P ?Clinical Course as of 09/12/21 2247  ?Sun Sep 12, 2021  ?1809 I discussed her bizarre looking ECG with Dr. Alfred Levins.  We discussed the ECG.  V1 and V2 look almost like a Brugada though not typical.  Unlikely to be STEMI.  Can follow-up outpatient with cardiology. [SG]  ?  ?Clinical Course User Index ?[SG] Sherwood Gambler, MD  ? ?                        ?  Medical Decision Making ?Amount and/or Complexity of Data Reviewed ?Labs: ordered. ?Radiology: ordered. ?ECG/medicine tests: ordered. ? ?Risk ?OTC drugs. ?Prescription drug management. ? ? ?Patient's COVID test is positive which is likely the source of her cough and fever.  Vital signs have normalized after being given Tylenol and fluids.  Chest x-ray images viewed by myself and on my interpretation there is no pneumonia.  As above I discussed her abnormal EKG with cardiology but this could be an asymptomatic Brugada versus other abnormal conduction that does not need emergent treatment.  Not consistent with STEMI.  She has no chest pain.  Troponin is normal.  Very low suspicion this is ACS.  Other labs are unremarkable aside some hypokalemia.  She is chronically on an over-the-counter potassium supplementation for hypokalemia which is presumably from her diuretic.  I will give her a couple extra days of potassium and have her follow-up with her PCP.  She is interested in starting Paxlovid.  No indication for admission at this time.  Will discharge home with return precautions. ? ? ? ? ? ? ? ?Final Clinical Impression(s) / ED Diagnoses ?Final diagnoses:  ?COVID-19  ?Hypokalemia  ?Abnormal EKG  ? ? ?Rx / DC Orders ?ED Discharge Orders   ? ?      Ordered  ?  potassium chloride SA (KLOR-CON M) 20 MEQ tablet  Daily       ? 09/12/21 2017  ?  nirmatrelvir/ritonavir EUA (PAXLOVID) 20 x 150 MG & 10 x '100MG'$  TABS  2 times daily        ? 09/12/21 2017  ? ?  ?  ? ?  ? ? ?  ?Sherwood Gambler, MD ?09/12/21 2248 ? ?

## 2021-09-12 NOTE — Discharge Instructions (Addendum)
Your COVID test is positive.  If you develop new or worsening shortness of breath, worsening cough, fever that does not go away, or any other new/concerning symptoms then return to the ER for evaluation. ? ?Your potassium is also mildly low so we are adding a short course of potassium to go along with your regular medications. ? ?Your EKG is abnormal and so you are being referred to a cardiologist.  If you develop lightheadedness, feeling like you are going to pass out or if you do pass out, chest pain, or any other new/concerning symptoms then return to the ER for evaluation. ?

## 2021-09-12 NOTE — ED Triage Notes (Signed)
Pt c/o cough and declining energy. Pt states she gets bronchitis . No n/v/d noted. Pt ambulated into triage. Afebrile.  ?

## 2021-09-13 ENCOUNTER — Telehealth: Payer: Self-pay

## 2021-09-13 NOTE — Telephone Encounter (Signed)
FYI Patient called was seen ER was COVID + wanted to let Dr Moshe Cipro know. ?

## 2021-09-14 LAB — URINE CULTURE

## 2021-09-14 NOTE — Telephone Encounter (Signed)
Dr Moshe Cipro will receive her ER notes from her visit  ?

## 2021-09-17 LAB — CULTURE, BLOOD (ROUTINE X 2)
Culture: NO GROWTH
Culture: NO GROWTH
Special Requests: ADEQUATE
Special Requests: ADEQUATE

## 2021-09-20 ENCOUNTER — Telehealth: Payer: Self-pay

## 2021-09-20 NOTE — Telephone Encounter (Signed)
Please call patient post ER for Covid - she is not sure what she should be doing at this point.  Does not know if she needs follow up with Dr Moshe Cipro ?

## 2021-09-21 NOTE — Telephone Encounter (Signed)
Spoke with pt and sch appt for tomorrow and advised ER if she felt worse or had sob or concerning symptoms  ?

## 2021-09-22 ENCOUNTER — Ambulatory Visit (INDEPENDENT_AMBULATORY_CARE_PROVIDER_SITE_OTHER): Payer: Medicare Other | Admitting: Family Medicine

## 2021-09-22 ENCOUNTER — Encounter: Payer: Self-pay | Admitting: Family Medicine

## 2021-09-22 VITALS — BP 131/85 | HR 79 | Resp 16 | Ht 67.0 in | Wt 165.0 lb

## 2021-09-22 DIAGNOSIS — E559 Vitamin D deficiency, unspecified: Secondary | ICD-10-CM

## 2021-09-22 DIAGNOSIS — I10 Essential (primary) hypertension: Secondary | ICD-10-CM

## 2021-09-22 DIAGNOSIS — R9431 Abnormal electrocardiogram [ECG] [EKG]: Secondary | ICD-10-CM | POA: Insufficient documentation

## 2021-09-22 DIAGNOSIS — R7301 Impaired fasting glucose: Secondary | ICD-10-CM

## 2021-09-22 DIAGNOSIS — F5105 Insomnia due to other mental disorder: Secondary | ICD-10-CM

## 2021-09-22 DIAGNOSIS — Z09 Encounter for follow-up examination after completed treatment for conditions other than malignant neoplasm: Secondary | ICD-10-CM | POA: Diagnosis not present

## 2021-09-22 DIAGNOSIS — Z13220 Encounter for screening for lipoid disorders: Secondary | ICD-10-CM

## 2021-09-22 DIAGNOSIS — F419 Anxiety disorder, unspecified: Secondary | ICD-10-CM

## 2021-09-22 DIAGNOSIS — F418 Other specified anxiety disorders: Secondary | ICD-10-CM

## 2021-09-22 MED ORDER — CLORAZEPATE DIPOTASSIUM 7.5 MG PO TABS: 7.5000 mg | ORAL_TABLET | Freq: Every day | ORAL | 3 refills | Status: DC

## 2021-09-22 NOTE — Patient Instructions (Addendum)
F/u in 6 weeks, call if you need me sooner ? ?Based on reported symptoms , your covid symptoms are much improved and you are fully vaccinated and treated ? ?I will refill chlorazepate ? ?We will try to get a sooner appt with Cardiology for you and be in touch ? ?Need fasting lipid, chem 7 and EGFr and TSh and vit D in the morning ? ?Thanks for choosing [facility], we consider it a privelige to serve you. ? ? ? ?

## 2021-09-23 ENCOUNTER — Telehealth: Payer: Self-pay | Admitting: Family Medicine

## 2021-09-23 NOTE — Telephone Encounter (Signed)
Patient wants a call back to relay info she forgot to mention on visit 4/5 ? ?Patient would not leave info with front.  ? ?Wants a call back when able. ?

## 2021-09-23 NOTE — Telephone Encounter (Signed)
States she forgot to tell her she has been having a feeling of inner anxiety or hyperness. Doesn't know how to explain it but she sometimes will jump if she walks around a corner and doesn't know why this is happening and wanted to report it  ?

## 2021-09-24 LAB — BMP8+EGFR
BUN/Creatinine Ratio: 13 (ref 12–28)
BUN: 10 mg/dL (ref 8–27)
CO2: 23 mmol/L (ref 20–29)
Calcium: 9.4 mg/dL (ref 8.7–10.3)
Chloride: 98 mmol/L (ref 96–106)
Creatinine, Ser: 0.75 mg/dL (ref 0.57–1.00)
Glucose: 94 mg/dL (ref 70–99)
Potassium: 3.6 mmol/L (ref 3.5–5.2)
Sodium: 138 mmol/L (ref 134–144)
eGFR: 84 mL/min/{1.73_m2} (ref >59)

## 2021-09-24 LAB — LIPID PANEL
Chol/HDL Ratio: 3.5 ratio (ref 0.0–4.4)
Cholesterol, Total: 145 mg/dL (ref 100–199)
HDL: 42 mg/dL (ref >39)
LDL Chol Calc (NIH): 87 mg/dL (ref 0–99)
Triglycerides: 83 mg/dL (ref 0–149)
VLDL Cholesterol Cal: 16 mg/dL (ref 5–40)

## 2021-09-24 LAB — TSH: TSH: 1.95 u[IU]/mL (ref 0.450–4.500)

## 2021-09-24 LAB — VITAMIN D 25 HYDROXY (VIT D DEFICIENCY, FRACTURES): Vit D, 25-Hydroxy: 61.4 ng/mL (ref 30.0–100.0)

## 2021-09-26 ENCOUNTER — Encounter: Payer: Self-pay | Admitting: Family Medicine

## 2021-09-26 DIAGNOSIS — Z09 Encounter for follow-up examination after completed treatment for conditions other than malignant neoplasm: Secondary | ICD-10-CM | POA: Insufficient documentation

## 2021-09-26 NOTE — Progress Notes (Signed)
? ?Tiffany Mendoza     MRN: [id number]      DOB: 1951-02-03 ? ? ?HPI ?Ms. Kisling is here for follow up and re-evaluation of chronic medical conditions, medication management and review of any available recent lab and radiology data.  ?Recently dx and treated for covid, but concerned that she still has significant fatigue. ?Most of the visit, if undirected will be spent discussing her Mother with dementia who is now in a facility and Cerys is coping with this change ?Recently seen in ED and had abnormal EKG which warranted consultation with Cardiology,while she was in the ED she has reached out for appt with that office and has a 3 month wait, and is concerned about this ?Preventive health is updated, specifically  Cancer screening and Immunization.   ?The PT denies any adverse reactions to current medications since the last visit.  ?  ? ?ROS ?C/o fatigue. ?Denies sinus pressure, nasal congestion, ear pain or sore throat. ?Denies chest congestion, productive cough or wheezing. ?Denies chest pains, palpitations and leg swelling ?Denies abdominal pain, nausea, vomiting,diarrhea or constipation.   ?Denies dysuria, frequency, hesitancy or incontinence. ?Denies joint pain, swelling and limitation in mobility. ?Denies headaches, seizures, numbness, or tingling. ?C/o , anxiety and insomnia. ?Denies skin break down or rash. ? ? ?PE ? ?BP 131/85   Pulse 79   Resp 16   Ht '5\' 7"'$  (1.702 m)   Wt 165 lb (74.8 kg)   SpO2 96%   BMI 25.84 kg/m?  ? ?Patient alert and oriented and in no cardiopulmonary distress. ? ?HEENT: No facial asymmetry, EOMI,     Neck supple . ? ?Chest: Clear to auscultation bilaterally. ? ?CVS: S1, S2 no murmurs, no S3.Regular rate. ? ?Ext: No edema ? ?MS: Adequate ROM spine, shoulders, hips and knees. ? ?Skin: Intact, no ulcerations or rash noted. ? ?Psych: Good eye contact, mildly  anxious oand depressed appearing. ? ?CNS: CN 2-12 intact, power,  normal throughout.no focal deficits  noted. ? ? ?Assessment & Plan ? ?Encounter for examination following treatment at hospital ?Patient in for follow up of recent ED visit on 3/26 when diagnosed with Covid 19 ?Discharge summary, and laboratory and radiology data are reviewed, and any questions or concerns  are discussed. ?Specific issues requiring follow up are specifically addressed.Specific attempt will be made to get sooner appt with Cardiolology ? ? ?Essential hypertension ?Controlled, no change in medication ?DASH diet and commitment to daily physical activity for a minimum of 30 minutes discussed and encouraged, as a part of hypertension management. ?The importance of attaining a healthy weight is also discussed. ? ? ?  09/22/2021  ?  4:01 PM 09/12/2021  ?  8:00 PM 09/12/2021  ?  6:00 PM 09/12/2021  ?  5:30 PM 09/12/2021  ?  5:05 PM 09/12/2021  ?  4:14 PM 09/12/2021  ?  4:12 PM  ?BP/Weight  ?Systolic BP 062 376 283 151 135  158  ?Diastolic BP 85 76 82 73 82  100  ?Wt. (Lbs) 165     170   ?BMI 25.84 kg/m2     26.63 kg/m2   ? ? ? ? ? ?Anxiety ?Uncontrolled , needs med refilled , will score at next visit while on medication ? ?Abnormal EKG ?Abnormal EKG noted during ED visit, pt with HTN and fatigue, Cardiology follow up recommended, Cardiology was also consulted by ED Physician, will reach out for appt sooner than 3 months ? ?Depression with anxiety ?Depression needs re evaluation  at next visit, continue  zoloft at current dose ? ?Insomnia secondary to anxiety ?Sleep hygiene reviewed and written information offered also. ?Prescription sent for  medication needed. ? ? ?

## 2021-09-26 NOTE — Assessment & Plan Note (Signed)
Uncontrolled , needs med refilled , will score at next visit while on medication ?

## 2021-09-26 NOTE — Assessment & Plan Note (Signed)
Abnormal EKG noted during ED visit, pt with HTN and fatigue, Cardiology follow up recommended, Cardiology was also consulted by ED Physician, will reach out for appt sooner than 3 months ?

## 2021-09-26 NOTE — Assessment & Plan Note (Signed)
Controlled, no change in medication ?DASH diet and commitment to daily physical activity for a minimum of 30 minutes discussed and encouraged, as a part of hypertension management. ?The importance of attaining a healthy weight is also discussed. ? ? ?  09/22/2021  ?  4:01 PM 09/12/2021  ?  8:00 PM 09/12/2021  ?  6:00 PM 09/12/2021  ?  5:30 PM 09/12/2021  ?  5:05 PM 09/12/2021  ?  4:14 PM 09/12/2021  ?  4:12 PM  ?BP/Weight  ?Systolic BP 627 035 009 381 135  158  ?Diastolic BP 85 76 82 73 82  100  ?Wt. (Lbs) 165     170   ?BMI 25.84 kg/m2     26.63 kg/m2   ? ? ? ? ?

## 2021-09-26 NOTE — Assessment & Plan Note (Addendum)
Patient in for follow up of recent ED visit on 3/26 when diagnosed with Covid 19 ?Discharge summary, and laboratory and radiology data are reviewed, and any questions or concerns  are discussed. ?Specific issues requiring follow up are specifically addressed.Specific attempt will be made to get sooner appt with Cardiolology ? ?

## 2021-09-26 NOTE — Assessment & Plan Note (Signed)
Depression needs re evaluation at next visit, continue  zoloft at current dose ?

## 2021-09-26 NOTE — Assessment & Plan Note (Signed)
Sleep hygiene reviewed and written information offered also. Prescription sent for  medication needed.  

## 2021-09-28 NOTE — Progress Notes (Signed)
?Cardiology Office Note:   ? ?Date:  09/29/2021  ? ?ID:  Tiffany Mendoza, DOB August 05, 1950, MRN [id number] ? ?PCP:  Fayrene Helper, MD ?  ?[facility] Providers ?Cardiologist:  None    ? ?Referring MD: Fayrene Helper, MD  ? ?CC: CP with EKG changes ?Consulted for the evaluation of  EKG changes at the behest of Fayrene Helper, MD ? ?History of Present Illness:   ? ?Tiffany Mendoza is a 71 y.o. female with a hx of HTN, who presents for evaluation 09/29/21. ? ?Patient notes that he is feeling better. ?In March she was going through a lot of stress: her mother has dementia and needs additional care.  Had been doing well, until recently. ?She was noting worsening fatigue, cough, subjective fever and malaise. ?She was found to have COVID-19- she thinks this was from getting her driver's license renewed. ?As part of her evaluation she has had ST elevations in AVR and V1 and V2.  Normal troponin. ? ?Notes rare chest pain.  Rarely related to activity ?No SOB, DOE. ?Rare palpitations. ?No syncope or near syncope. ?Anxiety has improved since mother is getting inpatient assistance. ? ?Patient reports prior cardiac testing including EKG 09/12/21 with anterior coved ST changes. ? ? ? ?Past Medical History:  ?Diagnosis Date  ? Adjustment disorder with mixed anxiety and depressed mood   ? Allergy   ? Carotid bruit   ? Depression   ? Dermatophytosis of foot   ? GERD (gastroesophageal reflux disease)   ? Hypertension   ? Insomnia   ? Neck pain   ? Osteoporosis   ? ? ?Past Surgical History:  ?Procedure Laterality Date  ? ABDOMINAL HYSTERECTOMY  1988  ? HEMORRHOID SURGERY    ? myoectomy  1984  ? ? ?Current Medications: ?Current Meds  ?Medication Sig  ? Apoaequorin (PREVAGEN EXTRA STRENGTH PO) Take 1 each by mouth daily.  ? Cholecalciferol (VITAMIN D3 PO) Take 5,000 Units by mouth daily.  ? clorazepate (TRANXENE) 7.5 MG tablet Take 1 tablet (7.5 mg total) by mouth at bedtime.  ? hydrochlorothiazide (HYDRODIURIL) 25 MG  tablet Take 1 tablet by mouth once daily  ? Omega-3 Fatty Acids (FISH OIL) 1000 MG CAPS Take by mouth.  ? Polyethylene Glycol 3350 (MIRALAX PO) Take by mouth daily as needed.  ? Potassium 99 MG TABS Take by mouth daily.  ? PSYLLIUM HUSK PO Take by mouth. 1 capsule daily  ? sertraline (ZOLOFT) 50 MG tablet Take one and a half tablets by mouth once daily  ? TURMERIC PO Take 1 Dose by mouth daily as needed (supplement).  ?  ? ?Allergies:   Patient has no known allergies.  ? ?Social History  ? ?Socioeconomic History  ? Marital status: Single  ?  Spouse name: Not on file  ? Number of children: Not on file  ? Years of education: 12+  ? Highest education level: 12th grade  ?Occupational History  ? Not on file  ?Tobacco Use  ? Smoking status: Former  ?  Types: Cigarettes  ? Smokeless tobacco: Never  ?Vaping Use  ? Vaping Use: Never used  ?Substance and Sexual Activity  ? Alcohol use: Yes  ?  Comment: occasional wine  ? Drug use: No  ? Sexual activity: Not Currently  ?Other Topics Concern  ? Not on file  ?Social History Narrative  ? Lives alone with Mother who has dementia   ? ?Social Determinants of Health  ? ?Financial Resource Strain: Low  Risk   ? Difficulty of Paying Living Expenses: Not hard at all  ?Food Insecurity: No Food Insecurity  ? Worried About Charity fundraiser in the Last Year: Never true  ? Ran Out of Food in the Last Year: Never true  ?Transportation Needs: No Transportation Needs  ? Lack of Transportation (Medical): No  ? Lack of Transportation (Non-Medical): No  ?Physical Activity: Insufficiently Active  ? Days of Exercise per Week: 3 days  ? Minutes of Exercise per Session: 30 min  ?Stress: No Stress Concern Present  ? Feeling of Stress : Only a little  ?Social Connections: Socially Isolated  ? Frequency of Communication with Friends and Family: Twice a week  ? Frequency of Social Gatherings with Friends and Family: Never  ? Attends Religious Services: Never  ? Active Member of Clubs or Organizations:  No  ? Attends Archivist Meetings: Never  ? Marital Status: Divorced  ?  ?Social: from [place] ? ?Family History: ?The patient's family history includes ALS in her father; Colon polyps in her father; Fibroids in her sister; Hypertension in her sister; Liver cancer in her paternal grandmother; Memory loss in her mother; Prostate cancer in her father; Thyroid disease in her sister; Thyroid nodules in her sister. ?No heart disease in family ? ?ROS:   ?Please see the history of present illness.    ? All other systems reviewed and are negative. ? ?EKGs/Labs/Other Studies Reviewed:   ? ?The following studies were reviewed today: ? ?EKG:  EKG is  ordered today.  The ekg ordered today demonstrates  ?09/29/21: SR rate 70 septal infarct pattern no ST changes ? ?Recent Labs: ?09/12/2021: ALT 20; Hemoglobin 14.1; Platelets 229 ?09/23/2021: BUN 10; Creatinine, Ser 0.75; Potassium 3.6; Sodium 138; TSH 1.950  ?Recent Lipid Panel ?   ?Component Value Date/Time  ? CHOL 145 09/23/2021 0955  ? TRIG 83 09/23/2021 0955  ? HDL 42 09/23/2021 0955  ? CHOLHDL 3.5 09/23/2021 0955  ? CHOLHDL 3.3 04/15/2020 1404  ? VLDL 15 06/01/2016 1619  ? [place] 87 09/23/2021 0955  ? LDLCALC 103 (H) 04/15/2020 1404  ? ? ?Physical Exam:   ? ?VS:  BP 118/74   Pulse 80   Ht '5\' 7"'$  (1.702 m)   Wt 167 lb (75.8 kg)   SpO2 99%   BMI 26.16 kg/m?    ? ?Wt Readings from Last 3 Encounters:  ?09/29/21 167 lb (75.8 kg)  ?09/22/21 165 lb (74.8 kg)  ?09/12/21 170 lb (77.1 kg)  ?  ? ?Gen: no distress   ?Neck: No JVD ?Cardiac: No Rubs or Gallops, no Murmur, RRR +2 radial pulses ?Respiratory: Clear to auscultation bilaterally, normal effort, normal  respiratory rate ?GI: Soft, nontender, non-distended normal ?MS: No  edema;  moves all extremities ?Integument: Skin feels warm ?Neuro:  At time of evaluation, alert and oriented to person/place/time/situation  ?Psych: Normal affect, patient feels better ? ? ?ASSESSMENT:   ? ?1. Precordial chest pain   ?2.  Nonspecific abnormal electrocardiogram (ECG) (EKG)   ?3. Essential hypertension   ?4. History of COVID-19   ? ?PLAN:   ? ?HTN ?Precordial chest pain ?EKG - ST changes and now septal Q waves ?Recent history of COVID-19 ?- has CP with EKG changes and no elevate troponin ?- would get Cardiac CT ?- if normal will plans for primary prevention eval in the fall ?- if positive will get echo and overbook for potential LHC ? ?   ? ? ?Medication Adjustments/Labs  and Tests Ordered: ?Current medicines are reviewed at length with the patient today.  Concerns regarding medicines are outlined above.  ?Orders Placed This Encounter  ?Procedures  ? EKG 12-Lead  ? ?No orders of the defined types were placed in this encounter. ? ? ?There are no Patient Instructions on file for this visit.  ? ?Signed, ?Werner Lean, MD  ?09/29/2021 2:20 PM    ?[place] ? ?

## 2021-09-29 ENCOUNTER — Ambulatory Visit (INDEPENDENT_AMBULATORY_CARE_PROVIDER_SITE_OTHER): Payer: Medicare Other | Admitting: Internal Medicine

## 2021-09-29 ENCOUNTER — Encounter: Payer: Self-pay | Admitting: Internal Medicine

## 2021-09-29 VITALS — BP 118/74 | HR 80 | Ht 67.0 in | Wt 167.0 lb

## 2021-09-29 DIAGNOSIS — R072 Precordial pain: Secondary | ICD-10-CM

## 2021-09-29 DIAGNOSIS — Z8616 Personal history of COVID-19: Secondary | ICD-10-CM | POA: Diagnosis not present

## 2021-09-29 DIAGNOSIS — I10 Essential (primary) hypertension: Secondary | ICD-10-CM

## 2021-09-29 DIAGNOSIS — R9431 Abnormal electrocardiogram [ECG] [EKG]: Secondary | ICD-10-CM | POA: Diagnosis not present

## 2021-09-29 MED ORDER — METOPROLOL TARTRATE 100 MG PO TABS: 100.0000 mg | ORAL_TABLET | ORAL | 0 refills | Status: DC

## 2021-09-29 NOTE — Patient Instructions (Signed)
Medication Instructions:  ? ?Take Lopressor 100 mg 2 Hours Prior to CT Scan  ? ?*If you need a refill on your cardiac medications before your next appointment, please call your pharmacy* ? ? ?Lab Work: ?Your physician recommends that you return for lab work one week prior to CT Scan  ? ?If you have labs (blood work) drawn today and your tests are completely normal, you will receive your results only by: ?MyChart Message (if you have MyChart) OR ?A paper copy in the mail ?If you have any lab test that is abnormal or we need to change your treatment, we will call you to review the results. ? ? ?Testing/Procedures: ?Cardiac CT Scan  ? ? ?Follow-Up: ?At [facility], you and your health needs are our priority.  As part of our continuing mission to provide you with exceptional heart care, we have created designated Provider Care Teams.  These Care Teams include your primary Cardiologist (physician) and Advanced Practice Providers (APPs -  Physician Assistants and Nurse Practitioners) who all work together to provide you with the care you need, when you need it. ? ?We recommend signing up for the patient portal called "MyChart".  Sign up information is provided on this After Visit Summary.  MyChart is used to connect with patients for Virtual Visits (Telemedicine).  Patients are able to view lab/test results, encounter notes, upcoming appointments, etc.  Non-urgent messages can be sent to your provider as well.   ?To learn more about what you can do with MyChart, go to [email or web address].   ? ?Your next appointment:   ? Fall  ? ?The format for your next appointment:   ?In Person ? ?Provider:   ?You may see Werner Lean, MD or one of the following Advanced Practice Providers on your designated Care Team:   ?Bernerd Pho, PA-C  ?Ermalinda Barrios, PA-C   ? ? ?Other Instructions ?Thank you for choosing [facility]! ? ? ? ?Important Information About [place] ? ? ? ? ? ? ? ?Your cardiac CT will be  scheduled at one of the below locations:  ? ?[facility] ?[address] ?[place], [place] [address] ?[phone] ? ?OR ? ?[facility] ?[address] ?[address] ?[place], [place] [address] ?[phone] ? ?If scheduled at [facility], please arrive at the [facility] and Children's Entrance (Entrance C2) of [facility] 30 minutes prior to test start time. ?You can use the FREE valet parking offered at entrance C (encouraged to control the heart rate for the test)  ?Proceed to the [facility] Radiology Department (first floor) to check-in and test prep. ? ?All radiology patients and guests should use entrance C2 at [facility], accessed from [facility], even though the hospital's physical address listed is [address]. ? ? ? ?If scheduled at [facility], please arrive 15 mins early for check-in and test prep. ? ?Please follow these instructions carefully (unless otherwise directed): ? ?On the Night Before the Test: ?Be sure to Drink plenty of water. ?Do not consume any caffeinated/decaffeinated beverages or chocolate 12 hours prior to your test. ?Do not take any antihistamines 12 hours prior to your test. ? ?On the Day of the Test: ?Drink plenty of water until 1 hour prior to the test. ?Do not eat any food 4 hours prior to the test. ?You may take your regular medications prior to the test.  ?Take metoprolol (Lopressor) two hours prior to  test. ?HOLD Furosemide/Hydrochlorothiazide morning of the test. ?FEMALES- please wear underwire-free bra if available, avoid dresses & tight clothing ? ? ?*For Clinical Staff only. Please instruct patient the following:* ?Heart Rate Medication Recommendations for Cardiac CT  ?Resting HR < 50 bpm  ?No medication  ?Resting HR 50-60 bpm and BP >110/50 mmHG   ?Consider Metoprolol tartrate 25 mg PO 90-120 min prior to scan  ?Resting HR 60-65 bpm and BP >110/50 mmHG   ?Metoprolol tartrate 50 mg PO 90-120 minutes prior to scan   ?Resting HR > 65 bpm and BP >110/50 mmHG  ?Metoprolol tartrate 100 mg PO 90-120 minutes prior to scan  ?Consider Ivabradine 10-15 mg PO or a calcium channel blocker for resting HR >60 bpm and contraindication to metoprolol tartrate  ?Consider Ivabradine 10-15 mg PO in combination with metoprolol tartrate for HR >80 bpm   ? ?     ?After the Test: ?Drink plenty of water. ?After receiving IV contrast, you may experience a mild flushed feeling. This is normal. ?On occasion, you may experience a mild rash up to 24 hours after the test. This is not dangerous. If this occurs, you can take Benadryl 25 mg and increase your fluid intake. ?If you experience trouble breathing, this can be serious. If it is severe call 911 IMMEDIATELY. If it is mild, please call our office. ?If you take any of these medications: Glipizide/Metformin, Avandament, Glucavance, please do not take 48 hours after completing test unless otherwise instructed. ? ?We will call to schedule your test 2-4 weeks out understanding that some insurance companies will need an authorization prior to the service being performed.  ? ?For non-scheduling related questions, please contact the cardiac imaging nurse navigator should you have any questions/concerns: ?Marchia Bond, Cardiac Imaging Nurse Navigator ?Gordy Clement, Cardiac Imaging Nurse Navigator ?[facility] ?Direct Office Dial: [phone]  ? ?For scheduling needs, including cancellations and rescheduling, please call [place], [phone]. ? ?

## 2021-10-08 ENCOUNTER — Telehealth (HOSPITAL_COMMUNITY): Payer: Self-pay | Admitting: *Deleted

## 2021-10-08 NOTE — Telephone Encounter (Signed)
Reaching out to patient to offer assistance regarding upcoming cardiac imaging study; pt verbalizes understanding of appt date/time, parking situation and where to check in, pre-test NPO status and medications ordered, and verified current allergies; name and call back number provided for further questions should they arise  Sheena Donegan RN Navigator Cardiac Imaging [facility] [phone] office [phone] cell  Patient to take 100mg metoprolol tartrate two hours prior to her cardiac CT scan. She is aware to arrive at 12:30pm. 

## 2021-10-11 ENCOUNTER — Ambulatory Visit (HOSPITAL_COMMUNITY)
Admission: RE | Admit: 2021-10-11 | Discharge: 2021-10-11 | Disposition: A | Discharge status: Home / Self Care | Payer: Medicare Other | Source: Ambulatory Visit | Attending: Internal Medicine | Admitting: Internal Medicine

## 2021-10-11 DIAGNOSIS — R072 Precordial pain: Secondary | ICD-10-CM

## 2021-10-11 DIAGNOSIS — R9431 Abnormal electrocardiogram [ECG] [EKG]: Secondary | ICD-10-CM | POA: Diagnosis present

## 2021-10-11 MED ORDER — NITROGLYCERIN 0.4 MG SL SUBL
0.8000 mg | SUBLINGUAL_TABLET | Freq: Once | SUBLINGUAL | Status: AC
  Administered 2021-10-11: 0.8 mg via SUBLINGUAL

## 2021-10-11 MED ORDER — NITROGLYCERIN 0.4 MG SL SUBL
SUBLINGUAL_TABLET | SUBLINGUAL | Status: AC
  Filled 2021-10-11: qty 2

## 2021-10-11 MED ORDER — IOHEXOL 350 MG/ML SOLN
95.0000 mL | Freq: Once | INTRAVENOUS | Status: AC | PRN
  Administered 2021-10-11: 95 mL via INTRAVENOUS

## 2021-10-18 ENCOUNTER — Telehealth: Payer: Self-pay

## 2021-10-18 NOTE — Telephone Encounter (Signed)
Patient called asked if blood work needed again. ? ?Please contact patient and leave a detailed message if she needs to redo her blood work before her next appt 05.24.2023. patient had to rescheduled other appt further out due to covid. ?Call back # [phone]. ?

## 2021-10-22 NOTE — Telephone Encounter (Signed)
Patient aware.

## 2021-10-29 ENCOUNTER — Ambulatory Visit: Payer: Medicare Other | Admitting: Gastroenterology

## 2021-11-10 ENCOUNTER — Ambulatory Visit (INDEPENDENT_AMBULATORY_CARE_PROVIDER_SITE_OTHER): Payer: Medicare Other | Admitting: Family Medicine

## 2021-11-10 ENCOUNTER — Encounter: Payer: Self-pay | Admitting: Family Medicine

## 2021-11-10 ENCOUNTER — Ambulatory Visit: Payer: Medicare Other | Admitting: Family Medicine

## 2021-11-10 VITALS — BP 150/85 | HR 80 | Ht 67.0 in | Wt 164.0 lb

## 2021-11-10 DIAGNOSIS — F5104 Psychophysiologic insomnia: Secondary | ICD-10-CM

## 2021-11-10 DIAGNOSIS — E663 Overweight: Secondary | ICD-10-CM | POA: Diagnosis not present

## 2021-11-10 DIAGNOSIS — E870 Hyperosmolality and hypernatremia: Secondary | ICD-10-CM | POA: Diagnosis not present

## 2021-11-10 DIAGNOSIS — F418 Other specified anxiety disorders: Secondary | ICD-10-CM | POA: Diagnosis not present

## 2021-11-10 NOTE — Patient Instructions (Addendum)
F/u in early September, flu vaccine  at visit, call if you need me sooner  Continue current medication    You need to take your blood pressure medication regularly every day  around the same time for improved blood pressure control  Thankful heart has checked out well

## 2021-11-13 ENCOUNTER — Encounter: Payer: Self-pay | Admitting: Family Medicine

## 2021-11-13 DIAGNOSIS — I10 Essential (primary) hypertension: Secondary | ICD-10-CM | POA: Insufficient documentation

## 2021-11-13 DIAGNOSIS — E870 Hyperosmolality and hypernatremia: Secondary | ICD-10-CM | POA: Insufficient documentation

## 2021-11-13 NOTE — Assessment & Plan Note (Signed)
Elevated at visit, forgot meication DASH diet and commitment to daily physical activity for a minimum of 30 minutes discussed and encouraged, as a part of hypertension management. The importance of attaining a healthy weight is also discussed.     11/10/2021    3:24 PM 10/11/2021    1:02 PM 10/11/2021   12:30 PM 09/29/2021    1:38 PM 09/22/2021    4:01 PM 09/12/2021    8:00 PM 09/12/2021    6:00 PM  BP/Weight  Systolic BP 041 364 383 [phone]  Diastolic BP 85 65 81 74 85 76 82  Wt. (Lbs) 164   167 165    BMI 25.69 kg/m2   26.16 kg/m2 25.84 kg/m2

## 2021-11-13 NOTE — Assessment & Plan Note (Signed)
  Patient re-educated about  the importance of commitment to a  minimum of 150 minutes of exercise per week as able.  The importance of healthy food choices with portion control discussed, as well as eating regularly and within a 12 hour window most days. The need to choose "clean , green" food 50 to 75% of the time is discussed, as well as to make water the primary drink and set a goal of 64 ounces water daily.       11/10/2021    3:24 PM 09/29/2021    1:38 PM 09/22/2021    4:01 PM  Weight /BMI  Weight 164 lb 167 lb 165 lb  Height '5\' 7"'$  (1.702 m) '5\' 7"'$  (1.702 m) '5\' 7"'$  (1.702 m)  BMI 25.69 kg/m2 26.16 kg/m2 25.84 kg/m2

## 2021-11-13 NOTE — Progress Notes (Signed)
Tiffany Mendoza     MRN: [id number]      DOB: [birth year]   HPI Tiffany Mendoza is here for follow up and re-evaluation of chronic medical conditions, medication management and review of any available recent lab and radiology data.  Preventive health is updated, specifically  Cancer screening and Immunization.   Ongoing stress re Mom's declining health, ow in hospice, getting more help from her sibs, but feels stressed often and reports missing meds from time to time unintentionally including he day of her visit   ROS Denies recent fever or chills. Denies sinus pressure, nasal congestion, ear pain or sore throat. Denies chest congestion, productive cough or wheezing. Denies chest pains, palpitations and leg swelling Denies abdominal pain, nausea, vomiting,diarrhea or constipation.   Denies dysuria, frequency, hesitancy or incontinence. Denies joint pain, swelling and limitation in mobility. Denies headaches, seizures, numbness, or tingling.  Denies skin break down or rash.   PE  BP (!) 150/85   Pulse 80   Ht '5\' 7"'$  (1.702 m)   Wt 164 lb (74.4 kg)   SpO2 97%   BMI 25.69 kg/m   Patient alert and oriented and in no cardiopulmonary distress.  HEENT: No facial asymmetry, EOMI,     Neck supple .  Chest: Clear to auscultation bilaterally.  CVS: S1, S2 no murmurs, no S3.Regular rate.  ABD: Soft non tender.   Ext: No edema  MS: Adequate ROM spine, shoulders, hips and knees.  Skin: Intact, no ulcerations or rash noted.  Psych: Good eye contact, normal affect. Memory intact mildly anxious not  depressed appearing.  CNS: CN 2-12 intact, power,  normal throughout.no focal deficits noted.   Assessment & Plan  Depression with anxiety Uncontrolled anxiety interfering with her health, has support from hospice and her family to a greater extent than in the past, no med change Pt vented for 10 minutes  Essential hypernatremia Elevated at visit, forgot meication DASH diet and  commitment to daily physical activity for a minimum of 30 minutes discussed and encouraged, as a part of hypertension management. The importance of attaining a healthy weight is also discussed.     11/10/2021    3:24 PM 10/11/2021    1:02 PM 10/11/2021   12:30 PM 09/29/2021    1:38 PM 09/22/2021    4:01 PM 09/12/2021    8:00 PM 09/12/2021    6:00 PM  BP/Weight  Systolic BP 710 626 948 546 270 350 093  Diastolic BP 85 65 81 74 85 76 82  Wt. (Lbs) 164   167 165    BMI 25.69 kg/m2   26.16 kg/m2 25.84 kg/m2         Insomnia Sleep hygiene reviewed and written information offered also. Prescription sent for  medication needed.   Overweight (BMI 25.0-29.9)  Patient re-educated about  the importance of commitment to a  minimum of 150 minutes of exercise per week as able.  The importance of healthy food choices with portion control discussed, as well as eating regularly and within a 12 hour window most days. The need to choose "clean , green" food 50 to 75% of the time is discussed, as well as to make water the primary drink and set a goal of 64 ounces water daily.       11/10/2021    3:24 PM 09/29/2021    1:38 PM 09/22/2021    4:01 PM  Weight /BMI  Weight 164 lb 167 lb 165 lb  Height 5'  7" (1.702 m) '5\' 7"'$  (1.702 m) '5\' 7"'$  (1.702 m)  BMI 25.69 kg/m2 26.16 kg/m2 25.84 kg/m2

## 2021-11-13 NOTE — Assessment & Plan Note (Signed)
Uncontrolled anxiety interfering with her health, has support from hospice and her family to a greater extent than in the past, no med change Pt vented for 10 minutes

## 2021-11-13 NOTE — Assessment & Plan Note (Signed)
Sleep hygiene reviewed and written information offered also. Prescription sent for  medication needed.  

## 2021-11-18 ENCOUNTER — Other Ambulatory Visit: Payer: Self-pay | Admitting: Family Medicine

## 2021-12-16 ENCOUNTER — Telehealth: Payer: Self-pay | Admitting: Family Medicine

## 2021-12-16 NOTE — Telephone Encounter (Signed)
Pt called wanting to speak with you in regards to health reasons. Can you please call when available?

## 2021-12-16 NOTE — Telephone Encounter (Signed)
If pertaining to health reasons this can be a visit (in office OR tele)

## 2021-12-22 ENCOUNTER — Telehealth: Payer: Self-pay

## 2021-12-22 NOTE — Telephone Encounter (Signed)
Opened in error

## 2021-12-22 NOTE — Telephone Encounter (Deleted)
Tiffany Mendoza wants to know how would she go about speaking with you directly about her mom's passing. She is going through some things and believes only you would understand. She says it was offered to her if she needed to talk she can call on you. She says it goes beyond the medical. She says you gave her a call around the time of the funeral and she is struggling and appreciates the type of person you are. She can be reached at [phone]

## 2021-12-31 ENCOUNTER — Encounter: Payer: Self-pay | Admitting: Gastroenterology

## 2021-12-31 ENCOUNTER — Ambulatory Visit (INDEPENDENT_AMBULATORY_CARE_PROVIDER_SITE_OTHER): Payer: Medicare Other | Admitting: Gastroenterology

## 2021-12-31 VITALS — BP 118/82 | HR 75 | Ht 67.0 in | Wt 161.0 lb

## 2021-12-31 DIAGNOSIS — M6289 Other specified disorders of muscle: Secondary | ICD-10-CM | POA: Diagnosis not present

## 2021-12-31 DIAGNOSIS — K5900 Constipation, unspecified: Secondary | ICD-10-CM | POA: Diagnosis not present

## 2021-12-31 DIAGNOSIS — K641 Second degree hemorrhoids: Secondary | ICD-10-CM

## 2021-12-31 MED ORDER — HYDROCORTISONE ACETATE 25 MG RE SUPP: 25.0000 mg | Freq: Every day | RECTAL | 0 refills | Status: DC

## 2021-12-31 NOTE — Patient Instructions (Addendum)
Use psyllium 1 capsule twice a day  We have sent the following medications to your pharmacy for you to pick up at your convenience:  Anusol   Use Miralax 1/2 capful every day as needed  If you are age 73 or older, your body mass index should be between 23-30. Your Body mass index is 25.22 kg/m. If this is out of the aforementioned range listed, please consider follow up with your Primary Care Provider.  If you are age 73 or younger, your body mass index should be between 19-25. Your Body mass index is 25.22 kg/m. If this is out of the aformentioned range listed, please consider follow up with your Primary Care Provider.   ________________________________________________________  The [facility] providers would like to encourage you to use [facility] to communicate with providers for non-urgent requests or questions.  Due to long hold times on the telephone, sending your provider a message by [facility] may be a faster and more efficient way to get a response.  Please allow 48 business hours for a response.  Please remember that this is for non-urgent requests.  _______________________________________________________   I appreciate the  opportunity to care for you  Thank You   Harl Bowie , MD

## 2021-12-31 NOTE — Progress Notes (Signed)
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  Status post internal hemorrhoid band ligation.  She had small-volume bright red blood per rectum and symptoms from hemorrhoids when she was experiencing constipation and was straining.  She is currently under significant stressors, recently her mom passed away. Her symptoms are improving in the past 2 to 3 weeks, she started eating more fiber which helped improve her bowel habits and is no longer straining.  She has not had any rectal bleeding in the past 2 weeks  Colonoscopy 06/15/2017 - Redundant colon. - One 1 mm Hyperplastic polyp in the rectum, removed with a cold biopsy forceps. Resected and retrieved. - Non-bleeding internal hemorrhoids. - Diverticulosis in the sigmoid colon and in the ascending colon.  Outpatient Encounter Medications as of 12/31/2021  Medication Sig   Apoaequorin (PREVAGEN EXTRA STRENGTH PO) Take 1 each by mouth daily.   Cholecalciferol (VITAMIN D3 PO) Take 5,000 Units by mouth daily.   clorazepate (TRANXENE) 7.5 MG tablet Take 1 tablet (7.5 mg total) by mouth at bedtime.   hydrochlorothiazide (HYDRODIURIL) 25 MG tablet Take 1 tablet by mouth once daily   Omega-3 Fatty Acids (FISH OIL) 1000 MG CAPS Take by mouth.   Polyethylene Glycol 3350 (MIRALAX PO) Take by mouth daily as needed.   Potassium 99 MG TABS Take by mouth daily.   potassium chloride SA (KLOR-CON M) 20 MEQ tablet Take 1 tablet (20 mEq total) by mouth daily. (Patient not taking: Reported on 09/29/2021)   PSYLLIUM HUSK PO Take by mouth. 1 capsule daily   sertraline (ZOLOFT) 50  MG tablet Take one and a half tablets by mouth once daily   TURMERIC PO Take 1 Dose by mouth daily as needed (supplement).   No facility-administered encounter medications on file as of 12/31/2021.    Allergies as of 12/31/2021   (No Known Allergies)    Past Medical History:  Diagnosis Date   Adjustment disorder with mixed anxiety and depressed mood    Allergy    Carotid bruit    Depression    Dermatophytosis of foot    GERD (gastroesophageal reflux disease)    Hypertension    Insomnia    Neck pain    Osteoporosis     Past Surgical History:  Procedure Laterality Date   ABDOMINAL HYSTERECTOMY  1988   HEMORRHOID SURGERY     myoectomy  1984    Family History  Problem Relation Age of Onset   Colon polyps Father    Prostate cancer Father    ALS Father    Thyroid disease Sister    Thyroid nodules Sister        Thyroidectomy in 1984   Memory loss Mother    Fibroids Sister    Hypertension Sister    Liver cancer Paternal Grandmother     Social History   Socioeconomic History   Marital status: Single    Spouse name: Not on file   Number of children: Not on file   Years of education: 12+   Highest education level: 12th grade  Occupational History   Not on file  Tobacco Use   Smoking status: Former    Types: Cigarettes   Smokeless tobacco: Never  Vaping Use   Vaping Use: Never used  Substance and Sexual Activity   Alcohol use: Yes    Comment: occasional wine   Drug use: No   Sexual activity: Not Currently  Other Topics Concern   Not on file  Social History Narrative   Lives alone with Mother who has dementia    Social Determinants of Health   Financial Resource Strain: Low Risk  (12/16/2020)   Overall Financial Resource Strain (CARDIA)    Difficulty of Paying Living Expenses: Not hard at all  Food Insecurity: No Food Insecurity (12/16/2020)   Hunger Vital Sign    Worried About Running Out of Food in the Last Year: Never true    [place] in the  Last Year: Never true  Transportation Needs: No Transportation Needs (12/16/2020)   PRAPARE - Hydrologist (Medical): No    Lack of Transportation (Non-Medical): No  Physical Activity: Insufficiently Active (12/16/2020)   Exercise Vital Sign    Days of Exercise per Week: 3 days    Minutes of Exercise per Session: 30 min  Stress: No Stress Concern Present (12/16/2020)   [place]    Feeling of Stress : Only a little  Social Connections: Socially Isolated (12/16/2020)   Social Connection and Isolation Panel [NHANES]    Frequency of Communication with Friends and Family: Twice a week    Frequency of Social Gatherings with Friends and Family: Never    Attends Religious Services: Never    Marine scientist or Organizations: No    Attends Archivist Meetings: Never    Marital Status: Divorced  Human resources officer Violence: Not At Risk (12/16/2020)   Humiliation, Afraid, Rape, and Kick questionnaire    Fear of Current or Ex-Partner: No    Emotionally Abused: No    Physically Abused: No    Sexually Abused: No      Review of systems: All other review of systems negative except as mentioned in the HPI.   Physical Exam: Vitals:   12/31/21 1454  BP: 118/82  Pulse: 75   Body mass index is 25.22 kg/m.   Gen:      No acute distress HEENT:  sclera anicteric Abd:      soft, non-tender; no palpable masses, no distension Ext:    No edema Neuro: alert and oriented x 3 Psych: normal mood and affect Rectal exam: Normal anal sphincter tone, no anal fissure or external hemorrhoids Anoscopy: Small internal hemorrhoids, no active bleeding, normal dentate line, no visible nodules  Data Reviewed:  Reviewed labs, radiology imaging, old records and pertinent past GI work up   Assessment and Plan/Recommendations:  71 year old very pleasant female with history of chronic irritable bowel  syndrome with constipation.  Symptomatic internal hemorrhoids status post hemorrhoid band ligation X3.  Continue MiraLAX daily with goal 1-2 soft bowel movements Fiber 1 to 2 teaspoons 3 times daily Increase water intake  Small internal hemorrhoids on anorectal exam, advised patient to avoid excessive straining during defecation Use Anusol cream small pea-sized amount per rectum as needed  Return in 3 months  This visit required >60 minutes of patient care (this includes precharting, chart review, review of results, face-to-face time used for counseling as well as treatment plan and follow-up. The  patient was provided an opportunity to ask questions and all were answered. The patient agreed with the plan and demonstrated an understanding of the instructions.  Damaris Hippo , MD    CC: Fayrene Helper, MD

## 2022-01-11 ENCOUNTER — Ambulatory Visit (INDEPENDENT_AMBULATORY_CARE_PROVIDER_SITE_OTHER): Payer: Medicare Other

## 2022-01-11 ENCOUNTER — Ambulatory Visit: Payer: Medicare Other | Admitting: Cardiology

## 2022-01-11 DIAGNOSIS — Z0000 Encounter for general adult medical examination without abnormal findings: Secondary | ICD-10-CM | POA: Diagnosis not present

## 2022-01-11 NOTE — Progress Notes (Signed)
Subjective:   Tiffany Mendoza is a 71 y.o. female who presents for Medicare Annual (Subsequent) preventive examination.  I connected with  Tiffany Mendoza on 01/11/22 by a audio enabled telemedicine application and verified that I am speaking with the correct person using two identifiers.  Patient Location: Home  Provider Location: Office/Clinic  I discussed the limitations of evaluation and management by telemedicine. The patient expressed understanding and agreed to proceed.   Review of Systems     Tiffany Mendoza , Thank you for taking time to come for your Medicare Wellness Visit. I appreciate your ongoing commitment to your health goals. Please review the following plan we discussed and let me know if I can assist you in the future.   These are the goals we discussed:  Goals       Find place of her own (pt-stated)      She would like to get out and find a place of her own.        This is a list of the screening recommended for you and due dates:  Health Maintenance  Topic Date Due   COVID-19 Vaccine (6 - Booster for Moderna series) 05/18/2021   Mammogram  08/20/2021   Zoster (Shingles) Vaccine (2 of 2) 11/30/2021   Flu Shot  01/18/2022   Colon Cancer Screening  06/16/2027   Tetanus Vaccine  12/31/2028   Pneumonia Vaccine  Completed   DEXA scan (bone density measurement)  Completed   Hepatitis C Screening: USPSTF Recommendation to screen - Ages 4-79 yo.  Completed   HPV Vaccine  Aged Out          Objective:    There were no vitals filed for this visit. There is no height or weight on file to calculate BMI.     09/12/2021    4:17 PM 05/20/2021    3:14 PM 12/16/2020   11:50 AM 05/27/2019   10:46 AM 05/15/2019    3:34 PM 05/02/2018    3:10 PM 10/20/2016    1:56 PM  Advanced Directives  Does Patient Have a Medical Advance Directive? No No No Yes No Yes Yes  Type of Advance Directive      Living will;Healthcare Power of [place];Living will  Copy of [place] in Chart?      No - copy requested No - copy requested  Would patient like information on creating a medical advance directive? No - Patient declined No - Patient declined No - Patient declined  No - Patient declined      Current Medications (verified) Outpatient Encounter Medications as of 01/11/2022  Medication Sig   Apoaequorin (PREVAGEN EXTRA STRENGTH PO) Take 1 each by mouth daily.   Cholecalciferol (VITAMIN D3 PO) Take 5,000 Units by mouth daily.   clorazepate (TRANXENE) 7.5 MG tablet Take 1 tablet (7.5 mg total) by mouth at bedtime.   hydrochlorothiazide (HYDRODIURIL) 25 MG tablet Take 1 tablet by mouth once daily   hydrocortisone (ANUSOL-HC) 25 MG suppository Place 1 suppository (25 mg total) rectally at bedtime.   Omega-3 Fatty Acids (FISH OIL) 1000 MG CAPS Take by mouth.   Polyethylene Glycol 3350 (MIRALAX PO) Take by mouth daily as needed.   Potassium 99 MG TABS Take by mouth daily.   potassium chloride SA (KLOR-CON M) 20 MEQ tablet Take 1 tablet (20 mEq total) by mouth daily. (Patient not taking: Reported on 09/29/2021)   PSYLLIUM HUSK PO Take by mouth. 1  capsule daily   sertraline (ZOLOFT) 50 MG tablet Take one and a half tablets by mouth once daily   TURMERIC PO Take 1 Dose by mouth daily as needed (supplement).   No facility-administered encounter medications on file as of 01/11/2022.    Allergies (verified) Patient has no known allergies.   History: Past Medical History:  Diagnosis Date   Adjustment disorder with mixed anxiety and depressed mood    Allergy    Carotid bruit    Depression    Dermatophytosis of foot    GERD (gastroesophageal reflux disease)    Hypertension    Insomnia    Neck pain    Osteoporosis    Past Surgical History:  Procedure Laterality Date   ABDOMINAL HYSTERECTOMY  1988   HEMORRHOID SURGERY     myoectomy  1984   Family History  Problem Relation Age of Onset   Memory loss  Mother    Colon polyps Father    Prostate cancer Father    ALS Father    Thyroid disease Sister    Thyroid nodules Sister        Thyroidectomy in 1984   Fibroids Sister    Hypertension Sister    Pancreatic cancer Maternal Grandfather    Liver cancer Paternal Grandmother    Stomach cancer Neg Hx    Colon cancer Neg Hx    Esophageal cancer Neg Hx    Social History   Socioeconomic History   Marital status: Single    Spouse name: Not on file   Number of children: 0   Years of education: 12+   Highest education level: 12th grade  Occupational History   Occupation: caregiver  Tobacco Use   Smoking status: Former    Types: Cigarettes   Smokeless tobacco: Never  Vaping Use   Vaping Use: Never used  Substance and Sexual Activity   Alcohol use: Yes    Comment: occasional wine   Drug use: No   Sexual activity: Not Currently  Other Topics Concern   Not on file  Social History Narrative   Lives alone with Mother who has dementia    Social Determinants of Health   Financial Resource Strain: Low Risk  (12/16/2020)   Overall Financial Resource Strain (CARDIA)    Difficulty of Paying Living Expenses: Not hard at all  Food Insecurity: No Food Insecurity (12/16/2020)   Hunger Vital Sign    Worried About Running Out of Food in the Last Year: Never true    Ran Out of Food in the Last Year: Never true  Transportation Needs: No Transportation Needs (12/16/2020)   PRAPARE - Hydrologist (Medical): No    Lack of Transportation (Non-Medical): No  Physical Activity: Insufficiently Active (12/16/2020)   Exercise Vital Sign    Days of Exercise per Week: 3 days    Minutes of Exercise per Session: 30 min  Stress: No Stress Concern Present (12/16/2020)   [place]    Feeling of Stress : Only a little  Social Connections: Socially Isolated (12/16/2020)   Social Connection and Isolation Panel [NHANES]     Frequency of Communication with Friends and Family: Twice a week    Frequency of Social Gatherings with Friends and Family: Never    Attends Religious Services: Never    Marine scientist or Organizations: No    Attends Archivist Meetings: Never    Marital Status: Divorced  Tobacco Counseling Counseling given: Not Answered   Clinical Intake:                 Diabetic? No         Activities of Daily Living     No data to display           Patient Care Team: Fayrene Helper, MD as PCP - General Werner Lean, MD as PCP - Cardiology (Cardiology) Maisie Fus, MD as Consulting Physician (Obstetrics and Gynecology) Rutherford Guys, MD as Consulting Physician (Ophthalmology)  Indicate any recent Medical Services you may have received from other than [facility] providers in the past year (date may be approximate).     Assessment:   This is a routine wellness examination for Rakeisha.  Hearing/Vision screen No results found.  Dietary issues and exercise activities discussed:     Goals Addressed   None   Depression Screen    11/10/2021    3:25 PM 06/13/2021    9:47 AM 12/16/2020   11:45 AM 11/10/2020   11:16 AM 01/02/2020    1:23 PM 01/02/2020    1:06 PM 05/27/2019   10:49 AM  PHQ 2/9 Scores  PHQ - 2 Score '2 1 1 1 2  4  '$ PHQ- 9 Score 3      14  Exception Documentation      Other- indicate reason in comment box   Not completed      would not answer the questions     Fall Risk    11/10/2021    3:24 PM 06/09/2021    4:11 PM 11/10/2020   10:57 AM 07/11/2019    3:10 PM 05/27/2019   10:47 AM  [place] in the past year? 0 0 0 0 0  Number falls in past yr: 0 0 0 0 0  Injury with Fall? 0 0 0 0 0  Risk for fall due to : No Fall Risks      Follow up Falls evaluation completed    Falls evaluation completed;Education provided;Falls prevention discussed    FALL RISK PREVENTION PERTAINING TO THE HOME:  Any stairs in or  around the home? Yes  If so, are there any without handrails? No  Home free of loose throw rugs in walkways, pet beds, electrical cords, etc? Yes  Adequate lighting in your home to reduce risk of falls? Yes   ASSISTIVE DEVICES UTILIZED TO PREVENT FALLS:  Life alert? No  Use of a cane, walker or w/c? No  Grab bars in the bathroom? Yes  Shower chair or bench in shower? Yes  Elevated toilet seat or a handicapped toilet? Yes    Cognitive Function:        12/16/2020   11:52 AM 05/27/2019   10:51 AM 05/23/2018    1:54 PM 08/31/2016    3:51 PM  6CIT Screen  What Year? 0 points 0 points 0 points 0 points  What month? 0 points 0 points 0 points 0 points  What time? 0 points 0 points 0 points 0 points  Count back from 20 0 points 0 points 0 points 0 points  Months in reverse 0 points 0 points 0 points 0 points  Repeat phrase 0 points 0 points 0 points 0 points  Total Score 0 points 0 points 0 points 0 points    Immunizations Immunization History  Administered Date(s) Administered   Fluad Quad(high Dose 65+) 04/02/2019, 04/15/2020, 05/20/2021   Influenza Split 04/18/2011, 08/09/2012  Influenza Whole 04/29/2008, 04/15/2010   Influenza, High Dose Seasonal PF 04/26/2018   Influenza,inj,Quad PF,6+ Mos 03/18/2014, 04/28/2015, 05/18/2016, 05/24/2017   Moderna SARS-COV2 Booster Vaccination 11/13/2020   Moderna Sars-Covid-2 Vaccination 08/24/2019, 09/25/2019, 04/13/2020, 11/13/2020, 03/23/2021   PPD Test 08/09/2012   Pneumococcal Conjugate-13 08/14/2015   Pneumococcal Polysaccharide-23 09/17/2013   Td 08/14/2007   Tdap 01/01/2019   Zoster Recombinat (Shingrix) 10/05/2021   Zoster, Live 10/01/2014    TDAP status: Up to date  Flu Vaccine status: Up to date  Pneumococcal vaccine status: Up to date  Covid-19 vaccine status: Completed vaccines  Qualifies for Shingles Vaccine? Yes   Zostavax completed Yes   Shingrix Completed?: No.    Education has been provided regarding the  importance of this vaccine. Patient has been advised to call insurance company to determine out of pocket expense if they have not yet received this vaccine. Advised may also receive vaccine at local pharmacy or Health Dept. Verbalized acceptance and understanding.  Screening Tests Health Maintenance  Topic Date Due   COVID-19 Vaccine (6 - Booster for Moderna series) 05/18/2021   MAMMOGRAM  08/20/2021   Zoster Vaccines- Shingrix (2 of 2) 11/30/2021   INFLUENZA VACCINE  01/18/2022   COLONOSCOPY (Pts 45-74yr Insurance coverage will need to be confirmed)  06/16/2027   TETANUS/TDAP  12/31/2028   Pneumonia Vaccine 71 Years old  Completed   DEXA SCAN  Completed   Hepatitis C Screening  Completed   HPV VACCINES  Aged Out    Health Maintenance  Health Maintenance Due  Topic Date Due   COVID-19 Vaccine (6 - Booster for Moderna series) 05/18/2021   MAMMOGRAM  08/20/2021   Zoster Vaccines- Shingrix (2 of 2) 11/30/2021    Colorectal cancer screening: Type of screening: Colonoscopy. Completed 06/15/2017. Repeat every 10 years  Mammogram scheduled  Bone Density status: Completed 06/19/2019. Results reflect: Bone density results: NORMAL. Repeat every 5 years.  Lung Cancer Screening: (Low Dose CT Chest recommended if Age 73-80years, 30 pack-year currently smoking OR have quit w/in 15years.) does qualify.    Additional Screening:  Hepatitis C Screening: does qualify; Completed 04/28/2015  Vision Screening: Recommended annual ophthalmology exams for early detection of glaucoma and other disorders of the eye. Is the patient up to date with their annual eye exam?  No  Who is the provider or what is the name of the office in which the patient attends annual eye exams? Wasn't sure  Dental Screening: Recommended annual dental exams for proper oral hygiene  Community Resource Referral / Chronic Care Management: CRR required this visit?  No   CCM required this visit?  No      Plan:      I have personally reviewed and noted the following in the patient's chart:   Medical and social history Use of alcohol, tobacco or illicit drugs  Current medications and supplements including opioid prescriptions.  Functional ability and status Nutritional status Physical activity Advanced directives List of other physicians Hospitalizations, surgeries, and ER visits in previous 12 months Vitals Screenings to include cognitive, depression, and falls Referrals and appointments  In addition, I have reviewed and discussed with patient certain preventive protocols, quality metrics, and best practice recommendations. A written personalized care plan for preventive services as well as general preventive health recommendations were provided to patient.     KJohny Drilling [place]  01/11/2022   Nurse Notes:  Ms. WTyndall, Thank you for taking time to come for your Medicare Wellness Visit. I  appreciate your ongoing commitment to your health goals. Please review the following plan we discussed and let me know if I can assist you in the future.   These are the goals we discussed:  Goals       Find place of her own (pt-stated)      She would like to get out and find a place of her own.        This is a list of the screening recommended for you and due dates:  Health Maintenance  Topic Date Due   COVID-19 Vaccine (6 - Booster for Moderna series) 05/18/2021   Mammogram  08/20/2021   Zoster (Shingles) Vaccine (2 of 2) 11/30/2021   Flu Shot  01/18/2022   Colon Cancer Screening  06/16/2027   Tetanus Vaccine  12/31/2028   Pneumonia Vaccine  Completed   DEXA scan (bone density measurement)  Completed   Hepatitis C Screening: USPSTF Recommendation to screen - Ages 28-79 yo.  Completed   HPV Vaccine  Aged Out

## 2022-01-17 ENCOUNTER — Encounter: Payer: Self-pay | Admitting: Gastroenterology

## 2022-01-24 ENCOUNTER — Other Ambulatory Visit: Payer: Self-pay | Admitting: Family Medicine

## 2022-02-04 ENCOUNTER — Other Ambulatory Visit: Payer: Self-pay | Admitting: Family Medicine

## 2022-02-04 MED ORDER — CLORAZEPATE DIPOTASSIUM 7.5 MG PO TABS: 7.5000 mg | ORAL_TABLET | Freq: Two times a day (BID) | ORAL | 5 refills | Status: DC | PRN

## 2022-02-08 ENCOUNTER — Other Ambulatory Visit: Payer: Self-pay | Admitting: Family Medicine

## 2022-02-10 ENCOUNTER — Ambulatory Visit (INDEPENDENT_AMBULATORY_CARE_PROVIDER_SITE_OTHER): Payer: Medicare Other | Admitting: Family Medicine

## 2022-02-10 ENCOUNTER — Encounter: Payer: Self-pay | Admitting: Family Medicine

## 2022-02-10 VITALS — BP 135/82 | HR 72 | Ht 67.0 in | Wt 162.8 lb

## 2022-02-10 DIAGNOSIS — F4321 Adjustment disorder with depressed mood: Secondary | ICD-10-CM

## 2022-02-10 DIAGNOSIS — F418 Other specified anxiety disorders: Secondary | ICD-10-CM | POA: Diagnosis not present

## 2022-02-10 DIAGNOSIS — Z5986 Financial insecurity: Secondary | ICD-10-CM

## 2022-02-10 DIAGNOSIS — Z59819 Housing instability, housed unspecified: Secondary | ICD-10-CM | POA: Diagnosis not present

## 2022-02-10 DIAGNOSIS — E870 Hyperosmolality and hypernatremia: Secondary | ICD-10-CM

## 2022-02-10 MED ORDER — SERTRALINE HCL 50 MG PO TABS: ORAL_TABLET | ORAL | 1 refills | Status: DC

## 2022-02-10 MED ORDER — HYDROCHLOROTHIAZIDE 25 MG PO TABS: 25.0000 mg | ORAL_TABLET | Freq: Every day | ORAL | 1 refills | Status: DC

## 2022-02-10 NOTE — Patient Instructions (Signed)
F/u in Septemebr as before, call if you need me sooner  You are referred to [facility] for assistance with housing  Nurse please give her  info re [employer] grief sessions  Thanks for choosing [facility], we consider it a privelige to serve you.

## 2022-02-11 ENCOUNTER — Telehealth: Payer: Self-pay

## 2022-02-11 NOTE — Telephone Encounter (Signed)
   Telephone encounter was:  Successful.  02/11/2022 Name: Tiffany Mendoza MRN: [id number] DOB: 1950/11/26  Tiffany Mendoza is a 71 y.o. year old female who is a primary care patient of Moshe Cipro [place] Levo, MD . The community resource team was consulted for assistance with  housing.  Care guide performed the following interventions: Spoke with patient verified email address sent information for [place] and [employer].  Patient has my name and number to contact if she does not receive the information. Letter saved in Epic.  Follow Up Plan:  No further follow up planned at this time. The patient has been provided with needed resources.  Albertha Beattie, AAS Paralegal, [place] [facility]  [address], [place] [address] ??[email or web address]  ?? [phone]   [email or web address]

## 2022-02-13 ENCOUNTER — Encounter: Payer: Self-pay | Admitting: Family Medicine

## 2022-02-13 DIAGNOSIS — Z5986 Financial insecurity: Secondary | ICD-10-CM | POA: Insufficient documentation

## 2022-02-13 DIAGNOSIS — F4321 Adjustment disorder with depressed mood: Secondary | ICD-10-CM | POA: Insufficient documentation

## 2022-02-13 NOTE — Assessment & Plan Note (Signed)
Refer [facility]

## 2022-02-13 NOTE — Assessment & Plan Note (Signed)
Currently increased as grieving recent passing of her Mom 2 months ago

## 2022-02-13 NOTE — Progress Notes (Signed)
   Tiffany Mendoza     MRN: [id number]      DOB: [birth year]   HPI Tiffany Mendoza is here with main c/o grieving her mother who passed approximately 2 months ago. 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 Though she gets food stamps money runs out before the en of the month   ROS Denies recent fever or chills. Denies sinus pressure, nasal congestion, ear pain or sore throat. Denies chest congestion, productive cough or wheezing. Denies chest pains, palpitations and leg swelling Denies abdominal pain, nausea, vomiting,diarrhea or constipation.   Denies dysuria, frequency, hesitancy or incontinence. Denies joint pain, swelling and limitation in mobility. Denies headaches, seizures, numbness, or tingling.  Denies skin break down or rash.   PE  BP 135/82   Pulse 72   Ht '5\' 7"'$  (1.702 m)   Wt 162 lb 12.8 oz (73.8 kg)   SpO2 96%   BMI 25.50 kg/m   Patient alert and oriented and in no cardiopulmonary distress.  HEENT: No facial asymmetry, EOMI,     Neck supple .  r.   Ext: No edema  MS: Adequate ROM spine, shoulders, hips and knees.  Skin: Intact, no ulcerations or rash noted.  Psych: Good eye contact, t. Memory intact not anxious mildly  depressed appearing.Tearful at times  CNS: CN 2-12 intact, power,  normal throughout.no focal deficits noted.   Assessment & Plan  Housing insecurity Currently neediung affordable housing , due to recent passing of her Mother , refer [facility]  Depression with anxiety Currently increased as grieving recent passing of her Mom 2 months ago  Grief Currently experiencing expected grief associated with passing of her Mom 2 months ago, Tourist information centre manager provided  Essential  hypernatremia Controlled, no change in medication DASH diet and commitment to daily physical activity for a minimum of 30 minutes discussed and encouraged, as a part of hypertension management. The importance of attaining a healthy weight is also discussed.     02/10/2022    2:03 PM 12/31/2021    2:54 PM 11/10/2021    3:24 PM 10/11/2021    1:02 PM 10/11/2021   12:30 PM 09/29/2021    1:38 PM 09/22/2021    4:01 PM  BP/Weight  Systolic BP 967 893 810 175 102 585 277  Diastolic BP 82 82 85 65 81 74 85  Wt. (Lbs) 162.8 161 164   167 165  BMI 25.5 kg/m2 25.22 kg/m2 25.69 kg/m2   26.16 kg/m2 25.84 kg/m2       Financial insecurity Refer [facility]

## 2022-02-13 NOTE — Assessment & Plan Note (Signed)
Currently experiencing expected grief associated with passing of her Mom 2 months ago, Tourist information centre manager provided

## 2022-02-13 NOTE — Assessment & Plan Note (Signed)
Currently neediung affordable housing , due to recent passing of her Mother , refer [facility]

## 2022-02-13 NOTE — Assessment & Plan Note (Signed)
Controlled, no change in medication DASH diet and commitment to daily physical activity for a minimum of 30 minutes discussed and encouraged, as a part of hypertension management. The importance of attaining a healthy weight is also discussed.     02/10/2022    2:03 PM 12/31/2021    2:54 PM 11/10/2021    3:24 PM 10/11/2021    1:02 PM 10/11/2021   12:30 PM 09/29/2021    1:38 PM 09/22/2021    4:01 PM  BP/Weight  Systolic BP 681 275 170 017 494 496 759  Diastolic BP 82 82 85 65 81 74 85  Wt. (Lbs) 162.8 161 164   167 165  BMI 25.5 kg/m2 25.22 kg/m2 25.69 kg/m2   26.16 kg/m2 25.84 kg/m2

## 2022-02-14 ENCOUNTER — Telehealth: Payer: Self-pay

## 2022-02-14 NOTE — Telephone Encounter (Signed)
   Telephone encounter was:  Unsuccessful.  02/14/2022 Name: Tiffany Mendoza MRN: [id number] DOB: 1950-06-28  Unsuccessful outbound call made today to assist with:  Food Insecurity  Outreach Attempt:  1st Attempt  A HIPAA compliant voice message was left requesting a return call.  Instructed patient to call back at [phone].  Tyren Dugar, AAS Paralegal, [facility]  [address], [place] [address] ??[email or web address]  ?? [phone]   [email or web address]

## 2022-02-15 ENCOUNTER — Telehealth: Payer: Self-pay

## 2022-02-15 NOTE — Telephone Encounter (Signed)
   Telephone encounter was:  Successful.  02/15/2022 Name: Tiffany Mendoza MRN: [id number] DOB: 1950/07/20  Tiffany Mendoza is a 71 y.o. year old female who is a primary care patient of Fayrene Helper, MD . The community resource team was consulted for assistance with [place] guide performed the following interventions: Spoke with patient verified email address, sent [facility] [employer] list. Patient has my name and number to call if she does not receive the emailed resources.  Follow Up Plan:  No further follow up planned at this time. The patient has been provided with needed resources.  Alto Gandolfo, AAS Paralegal, [facility] [employer]  [address], [place] [address] ??[email or web address]  ?? [phone]   [email or web address]

## 2022-02-16 ENCOUNTER — Other Ambulatory Visit: Payer: Self-pay | Admitting: Family Medicine

## 2022-02-16 MED ORDER — CLORAZEPATE DIPOTASSIUM 7.5 MG PO TABS: 7.5000 mg | ORAL_TABLET | Freq: Two times a day (BID) | ORAL | 3 refills | Status: DC | PRN

## 2022-03-16 ENCOUNTER — Ambulatory Visit: Payer: Medicare Other | Admitting: Family Medicine

## 2022-04-11 ENCOUNTER — Telehealth: Payer: Self-pay | Admitting: Family Medicine

## 2022-04-11 NOTE — Telephone Encounter (Signed)
Forms for substitute teacher certification      noted, sleeved, copied

## 2022-04-12 ENCOUNTER — Telehealth: Payer: Self-pay

## 2022-04-12 ENCOUNTER — Telehealth: Payer: Self-pay | Admitting: Family Medicine

## 2022-04-12 NOTE — Telephone Encounter (Signed)
Correction, she needs TB skin test done and read before can be signed off , it is in folder at station

## 2022-04-12 NOTE — Telephone Encounter (Signed)
Patient called asking does she need to come in and have blood work done before 11/07 no labs entered.  Please contact patient to let her know if she need to come in sooner or same day blood work and if fasting or not. She needs to be prepared to know.

## 2022-04-12 NOTE — Telephone Encounter (Signed)
Pls ad vise needs shingrix #2, almost overdue, and also I recommend RSV vaccine , both are at her pharmacy Form completed , ready to Go

## 2022-04-12 NOTE — Telephone Encounter (Signed)
Patient aware will have it done at her next appt on 11/07

## 2022-04-13 ENCOUNTER — Other Ambulatory Visit: Payer: Self-pay

## 2022-04-13 DIAGNOSIS — I10 Essential (primary) hypertension: Secondary | ICD-10-CM

## 2022-04-13 NOTE — Telephone Encounter (Signed)
Patient aware.

## 2022-04-23 LAB — BMP8+EGFR
BUN/Creatinine Ratio: 14 (ref 12–28)
BUN: 11 mg/dL (ref 8–27)
CO2: 29 mmol/L (ref 20–29)
Calcium: 9.8 mg/dL (ref 8.7–10.3)
Chloride: 103 mmol/L (ref 96–106)
Creatinine, Ser: 0.78 mg/dL (ref 0.57–1.00)
Glucose: 98 mg/dL (ref 70–99)
Potassium: 3.8 mmol/L (ref 3.5–5.2)
Sodium: 143 mmol/L (ref 134–144)
eGFR: 80 mL/min/{1.73_m2} (ref >59)

## 2022-04-26 ENCOUNTER — Ambulatory Visit (INDEPENDENT_AMBULATORY_CARE_PROVIDER_SITE_OTHER): Payer: Medicare Other | Admitting: Family Medicine

## 2022-04-26 ENCOUNTER — Encounter: Payer: Self-pay | Admitting: Family Medicine

## 2022-04-26 DIAGNOSIS — Z59819 Housing instability, housed unspecified: Secondary | ICD-10-CM | POA: Diagnosis not present

## 2022-04-26 DIAGNOSIS — F5104 Psychophysiologic insomnia: Secondary | ICD-10-CM | POA: Diagnosis not present

## 2022-04-26 DIAGNOSIS — F418 Other specified anxiety disorders: Secondary | ICD-10-CM

## 2022-04-26 DIAGNOSIS — F4321 Adjustment disorder with depressed mood: Secondary | ICD-10-CM

## 2022-04-26 NOTE — Assessment & Plan Note (Signed)
Sleep hygiene reviewed and written information offered also. Prescription sent for  medication needed.  

## 2022-04-26 NOTE — Patient Instructions (Signed)
F/u in 5 months, call if you need me sooner   Nurse visit fopr PPD placement to be scheduled at checkout  Thankful you are doing better  Best for 2024!  Thanks for choosing [facility], we consider it a privelige to serve you.

## 2022-04-26 NOTE — Assessment & Plan Note (Signed)
Still looking for housing

## 2022-04-26 NOTE — Progress Notes (Signed)
Virtual Visit via Telephone Note  I connected with Tiffany Mendoza on 04/26/22 at  2:40 PM EST by telephone and verified that I am speaking with the correct person using two identifiers.  Location: Patient: home Provider: office   I discussed the limitations, risks, security and privacy concerns of performing an evaluation and management service by telephone and the availability of in person appointments. I also discussed with the patient that there may be a patient responsible charge related to this service. The patient expressed understanding and agreed to proceed.   History of Present Illness:   f/u grief and depression. Reports a lot of support from classmates, and family. Looking for mew place to live, and trusting that things will move forward one bit at a time Appetite is fair, no c/o bowel disturnbance UTD with health maintainace Observations/Objective:  Good communication with no confusion and intact memory. Alert  No signs of respiratory distress during speech  Assessment and Plan:  Grief Improving with  time , has support of family and friends  Insomnia Sleep hygiene reviewed and written information offered also. Prescription sent for  medication needed.   Housing insecurity Still looking for housing  Depression with anxiety Stable on current regime which includes therapy  Follow Up Instructions:    I discussed the assessment and treatment plan with the patient. The patient was provided an opportunity to ask questions and all were answered. The patient agreed with the plan and demonstrated an understanding of the instructions.   The patient was advised to call back or seek an in-person evaluation if the symptoms worsen or if the condition fails to improve as anticipated.  I provided 12 minutes of non-face-to-face time during this encounter.   Tula Nakayama, MD

## 2022-04-26 NOTE — Assessment & Plan Note (Signed)
Improving with  time , has support of family and friends

## 2022-04-29 NOTE — Assessment & Plan Note (Signed)
Stable on current regime which includes therapy

## 2022-05-09 ENCOUNTER — Ambulatory Visit (INDEPENDENT_AMBULATORY_CARE_PROVIDER_SITE_OTHER): Payer: Medicare Other

## 2022-05-09 DIAGNOSIS — Z111 Encounter for screening for respiratory tuberculosis: Secondary | ICD-10-CM | POA: Insufficient documentation

## 2022-05-09 NOTE — Addendum Note (Signed)
Addended by: Jill Side on: 05/09/2022 04:05 PM   Modules accepted: Orders

## 2022-05-11 DIAGNOSIS — Z0279 Encounter for issue of other medical certificate: Secondary | ICD-10-CM

## 2022-05-15 ENCOUNTER — Other Ambulatory Visit: Payer: Self-pay | Admitting: Family Medicine

## 2022-05-19 ENCOUNTER — Inpatient Hospital Stay: Payer: Medicare Other | Attending: Hematology

## 2022-05-19 ENCOUNTER — Ambulatory Visit (HOSPITAL_COMMUNITY)
Admission: RE | Admit: 2022-05-19 | Discharge: 2022-05-19 | Disposition: A | Discharge status: Home / Self Care | Payer: Medicare Other | Source: Ambulatory Visit | Attending: Hematology | Admitting: Hematology

## 2022-05-19 DIAGNOSIS — I700 Atherosclerosis of aorta: Secondary | ICD-10-CM | POA: Insufficient documentation

## 2022-05-19 DIAGNOSIS — R768 Other specified abnormal immunological findings in serum: Secondary | ICD-10-CM | POA: Insufficient documentation

## 2022-05-19 DIAGNOSIS — M47819 Spondylosis without myelopathy or radiculopathy, site unspecified: Secondary | ICD-10-CM | POA: Insufficient documentation

## 2022-05-19 DIAGNOSIS — Z79899 Other long term (current) drug therapy: Secondary | ICD-10-CM | POA: Insufficient documentation

## 2022-05-19 LAB — CBC WITH DIFFERENTIAL/PLATELET
Abs Immature Granulocytes: 0 10*3/uL (ref 0.00–0.07)
Basophils Absolute: 0 10*3/uL (ref 0.0–0.1)
Basophils Relative: 1 %
Eosinophils Absolute: 0.1 10*3/uL (ref 0.0–0.5)
Eosinophils Relative: 2 %
HCT: 38.2 % (ref 36.0–46.0)
Hemoglobin: 12.8 g/dL (ref 12.0–15.0)
Immature Granulocytes: 0 %
Lymphocytes Relative: 42 %
Lymphs Abs: 1.8 10*3/uL (ref 0.7–4.0)
MCH: 31.8 pg (ref 26.0–34.0)
MCHC: 33.5 g/dL (ref 30.0–36.0)
MCV: 94.8 fL (ref 80.0–100.0)
Monocytes Absolute: 0.4 10*3/uL (ref 0.1–1.0)
Monocytes Relative: 8 %
Neutro Abs: 2.1 10*3/uL (ref 1.7–7.7)
Neutrophils Relative %: 47 %
Platelets: 210 10*3/uL (ref 150–400)
RBC: 4.03 MIL/uL (ref 3.87–5.11)
RDW: 12.5 % (ref 11.5–15.5)
WBC: 4.3 10*3/uL (ref 4.0–10.5)
nRBC: 0 % (ref 0.0–0.2)

## 2022-05-19 LAB — LACTATE DEHYDROGENASE: LDH: 120 U/L (ref 98–192)

## 2022-05-19 LAB — COMPREHENSIVE METABOLIC PANEL
ALT: 22 U/L (ref 0–44)
AST: 23 U/L (ref 15–41)
Albumin: 4 g/dL (ref 3.5–5.0)
Alkaline Phosphatase: 44 U/L (ref 38–126)
Anion gap: 10 (ref 5–15)
BUN: 14 mg/dL (ref 8–23)
CO2: 26 mmol/L (ref 22–32)
Calcium: 9.1 mg/dL (ref 8.9–10.3)
Chloride: 102 mmol/L (ref 98–111)
Creatinine, Ser: 0.72 mg/dL (ref 0.44–1.00)
GFR, Estimated: >60 mL/min (ref >60)
Glucose, Bld: 103 mg/dL — ABNORMAL HIGH (ref 70–99)
Potassium: 3.5 mmol/L (ref 3.5–5.1)
Sodium: 138 mmol/L (ref 135–145)
Total Bilirubin: 0.8 mg/dL (ref 0.3–1.2)
Total Protein: 7.2 g/dL (ref 6.5–8.1)

## 2022-05-23 LAB — KAPPA/LAMBDA LIGHT CHAINS
Kappa free light chain: 23.4 mg/L — ABNORMAL HIGH (ref 3.3–19.4)
Kappa, lambda light chain ratio: 2.44 — ABNORMAL HIGH (ref 0.26–1.65)
Lambda free light chains: 9.6 mg/L (ref 5.7–26.3)

## 2022-05-23 LAB — PROTEIN ELECTROPHORESIS, SERUM
A/G Ratio: 1.4 (ref 0.7–1.7)
Albumin ELP: 3.7 g/dL (ref 2.9–4.4)
Alpha-1-Globulin: 0.2 g/dL (ref 0.0–0.4)
Alpha-2-Globulin: 0.7 g/dL (ref 0.4–1.0)
Beta Globulin: 0.8 g/dL (ref 0.7–1.3)
Gamma Globulin: 1.1 g/dL (ref 0.4–1.8)
Globulin, Total: 2.7 g/dL (ref 2.2–3.9)
Total Protein ELP: 6.4 g/dL (ref 6.0–8.5)

## 2022-05-23 LAB — IMMUNOFIXATION ELECTROPHORESIS
IgA: 155 mg/dL (ref 64–422)
IgG (Immunoglobin G), Serum: 1211 mg/dL (ref 586–1602)
IgM (Immunoglobulin M), Srm: 51 mg/dL (ref 26–217)
Total Protein ELP: 6.7 g/dL (ref 6.0–8.5)

## 2022-05-26 ENCOUNTER — Inpatient Hospital Stay: Payer: Medicare Other | Attending: Hematology | Admitting: Hematology

## 2022-05-26 VITALS — BP 160/91 | HR 80 | Temp 98.5°F | Resp 16 | Wt 164.0 lb

## 2022-05-26 DIAGNOSIS — Z8719 Personal history of other diseases of the digestive system: Secondary | ICD-10-CM | POA: Insufficient documentation

## 2022-05-26 DIAGNOSIS — Z79899 Other long term (current) drug therapy: Secondary | ICD-10-CM | POA: Insufficient documentation

## 2022-05-26 DIAGNOSIS — Z8249 Family history of ischemic heart disease and other diseases of the circulatory system: Secondary | ICD-10-CM | POA: Diagnosis not present

## 2022-05-26 DIAGNOSIS — Z5986 Financial insecurity: Secondary | ICD-10-CM | POA: Insufficient documentation

## 2022-05-26 DIAGNOSIS — Z8349 Family history of other endocrine, nutritional and metabolic diseases: Secondary | ICD-10-CM | POA: Insufficient documentation

## 2022-05-26 DIAGNOSIS — Z8042 Family history of malignant neoplasm of prostate: Secondary | ICD-10-CM | POA: Insufficient documentation

## 2022-05-26 DIAGNOSIS — Z87891 Personal history of nicotine dependence: Secondary | ICD-10-CM | POA: Diagnosis not present

## 2022-05-26 DIAGNOSIS — Z83711 Family history of hyperplastic colon polyps: Secondary | ICD-10-CM | POA: Diagnosis not present

## 2022-05-26 DIAGNOSIS — Z800 Family history of malignant neoplasm of digestive organs: Secondary | ICD-10-CM | POA: Diagnosis not present

## 2022-05-26 DIAGNOSIS — F418 Other specified anxiety disorders: Secondary | ICD-10-CM | POA: Diagnosis not present

## 2022-05-26 DIAGNOSIS — Z5941 Food insecurity: Secondary | ICD-10-CM | POA: Diagnosis not present

## 2022-05-26 DIAGNOSIS — Z818 Family history of other mental and behavioral disorders: Secondary | ICD-10-CM | POA: Diagnosis not present

## 2022-05-26 DIAGNOSIS — I700 Atherosclerosis of aorta: Secondary | ICD-10-CM | POA: Insufficient documentation

## 2022-05-26 DIAGNOSIS — M47812 Spondylosis without myelopathy or radiculopathy, cervical region: Secondary | ICD-10-CM | POA: Diagnosis not present

## 2022-05-26 DIAGNOSIS — R768 Other specified abnormal immunological findings in serum: Secondary | ICD-10-CM | POA: Diagnosis present

## 2022-05-26 DIAGNOSIS — M47816 Spondylosis without myelopathy or radiculopathy, lumbar region: Secondary | ICD-10-CM | POA: Insufficient documentation

## 2022-05-26 DIAGNOSIS — F32A Depression, unspecified: Secondary | ICD-10-CM | POA: Diagnosis not present

## 2022-05-26 NOTE — Progress Notes (Signed)
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, a 71 y.o. female, seen for follow-up of elevated free light chain ratio. She reports that she had COVID in March.  She is slightly depressed as her mother passed away.  No new bone pains reported.  REVIEW OF SYSTEMS:  Review of Systems  Constitutional:  Negative for appetite change, fatigue, fever and unexpected weight change.  All other systems reviewed and are negative.   PAST MEDICAL/SURGICAL HISTORY:  Past Medical History:  Diagnosis Date   Adjustment disorder with mixed anxiety and depressed mood    Allergy    Carotid bruit    Depression    Dermatophytosis of foot    GERD (gastroesophageal reflux disease)    Hypertension    Insomnia    Neck pain    Osteoporosis    Past Surgical History:  Procedure Laterality Date   ABDOMINAL HYSTERECTOMY  1988   HEMORRHOID SURGERY     myoectomy  1984    SOCIAL HISTORY:  Social History   Socioeconomic History   Marital status: Single    Spouse name: Not on file   Number of children: 0   Years of education: 12+   Highest education level: 12th grade  Occupational History   Occupation: caregiver  Tobacco Use   Smoking status: Former    Types: Cigarettes   Smokeless tobacco: Never  Scientific laboratory technician Use: Never used  Substance and Sexual Activity   Alcohol use: Yes    Comment: occasional wine   Drug use: No   Sexual activity: Not Currently  Other Topics Concern   Not on file  Social History Narrative   Lives alone with Mother who has dementia    Social Determinants of Health   Financial Resource Strain: Medium Risk (01/11/2022)    Overall Financial Resource Strain (CARDIA)    Difficulty of Paying Living Expenses: Somewhat hard  Food Insecurity: Food Insecurity Present (01/11/2022)   Hunger Vital Sign    Worried About Running Out of Food in the Last Year: Sometimes true    Ran Out of Food in the Last Year: Sometimes true  Transportation Needs: No Transportation Needs (01/11/2022)   PRAPARE - Hydrologist (Medical): No    Lack of Transportation (Non-Medical): No  Physical Activity: Inactive (01/11/2022)   Exercise Vital Sign    Days of Exercise per Week: 0 days    Minutes of Exercise per Session: 0 min  Stress: Stress Concern Present (01/11/2022)   [place]    Feeling of Stress : Rather much  Social Connections: Socially Isolated (01/11/2022)   Social Connection and Isolation Panel [NHANES]    Frequency of Communication with Friends and Family: Twice a week    Frequency of Social Gatherings with Friends and Family: Never    Attends Religious Services: Never    Marine scientist or Organizations: No    Attends Archivist Meetings: Never    Marital Status: Divorced  Human resources officer Violence: Not At Risk (01/11/2022)   Humiliation, Afraid, Rape, and Kick questionnaire  Fear of Current or Ex-Partner: No    Emotionally Abused: No    Physically Abused: No    Sexually Abused: No    FAMILY HISTORY:  Family History  Problem Relation Age of Onset   Memory loss Mother    Colon polyps Father    Prostate cancer Father    ALS Father    Thyroid disease Sister    Thyroid nodules Sister        Thyroidectomy in 1984   Fibroids Sister    Hypertension Sister    Pancreatic cancer Maternal Grandfather    Liver cancer Paternal Grandmother    Stomach cancer Neg Hx    Colon cancer Neg Hx    Esophageal cancer Neg Hx     CURRENT MEDICATIONS:  Current Outpatient Medications  Medication Sig Dispense Refill    Apoaequorin (PREVAGEN EXTRA STRENGTH PO) Take 1 each by mouth daily.     Cholecalciferol (VITAMIN D3 PO) Take 5,000 Units by mouth daily.     clorazepate (TRANXENE-T) 7.5 MG tablet Take 1 tablet (7.5 mg total) by mouth 2 (two) times daily as needed for anxiety. 60 tablet 3   hydrochlorothiazide (HYDRODIURIL) 25 MG tablet Take 1 tablet (25 mg total) by mouth daily. 90 tablet 1   Omega-3 Fatty Acids (FISH OIL) 1000 MG CAPS Take by mouth.     Polyethylene Glycol 3350 (MIRALAX PO) Take by mouth daily as needed.     Potassium 99 MG TABS Take by mouth daily.     PSYLLIUM HUSK PO Take by mouth. 1 capsule daily     sertraline (ZOLOFT) 50 MG tablet TAKE 1 & 1/2 (ONE & ONE-HALF) TABLETS BY MOUTH ONCE DAILY 45 tablet 0   TURMERIC PO Take 1 Dose by mouth daily as needed (supplement).     No current facility-administered medications for this visit.    ALLERGIES:  No Known Allergies  PHYSICAL EXAM:  Performance status (ECOG): 0 - Asymptomatic  Vitals:   05/26/22 1550  BP: (!) 160/91  Pulse: 80  Resp: 16  Temp: 98.5 F (36.9 C)  SpO2: 100%   Wt Readings from Last 3 Encounters:  05/26/22 164 lb (74.4 kg)  02/10/22 162 lb 12.8 oz (73.8 kg)  12/31/21 161 lb (73 kg)   Physical Exam Vitals reviewed.  Constitutional:      Appearance: Normal appearance.  Cardiovascular:     Rate and Rhythm: Normal rate and regular rhythm.     Pulses: Normal pulses.     Heart sounds: Normal heart sounds.  Pulmonary:     Effort: Pulmonary effort is normal.     Breath sounds: Normal breath sounds.  Neurological:     General: No focal deficit present.     Mental Status: She is alert and oriented to person, place, and time.  Psychiatric:        Mood and Affect: Mood normal.        Behavior: Behavior normal.     LABORATORY DATA:  I have reviewed the labs as listed.     Latest Ref Rng & Units 05/19/2022    2:59 PM 09/12/2021    5:22 PM 05/12/2021    2:31 PM  CBC  WBC 4.0 - 10.5 K/uL 4.3  9.6  4.8    Hemoglobin 12.0 - 15.0 g/dL 12.8  14.1  13.2   Hematocrit 36.0 - 46.0 % 38.2  42.7  39.6   Platelets 150 - 400 K/uL 210  229  234  Latest Ref Rng & Units 05/19/2022    2:59 PM 04/22/2022    3:10 PM 09/23/2021    9:55 AM  CMP  Glucose 70 - 99 mg/dL 103  98  94   BUN 8 - 23 mg/dL _0 Creatinine 0.44 - 1.00 mg/dL 0.72  0.78  0.75   Sodium 135 - 145 mmol/L 138  143  138   Potassium 3.5 - 5.1 mmol/L 3.5  3.8  3.6   Chloride 98 - 111 mmol/L 102  103  98   CO2 22 - 32 mmol/L _1 Calcium 8.9 - 10.3 mg/dL 9.1  9.8  9.4   Total Protein 6.5 - 8.1 g/dL 7.2     Total Bilirubin 0.3 - 1.2 mg/dL 0.8     Alkaline Phos 38 - 126 U/L 44     AST 15 - 41 U/L 23     ALT 0 - 44 U/L 22         Component Value Date/Time   RBC 4.03 05/19/2022 1459   MCV 94.8 05/19/2022 1459   MCV 91 09/30/2020 1605   MCH 31.8 05/19/2022 1459   MCHC 33.5 05/19/2022 1459   RDW 12.5 05/19/2022 1459   RDW 12.5 09/30/2020 1605   LYMPHSABS 1.8 05/19/2022 1459   LYMPHSABS 2.3 09/30/2020 1605   MONOABS 0.4 05/19/2022 1459   EOSABS 0.1 05/19/2022 1459   EOSABS 0.1 09/30/2020 1605   BASOSABS 0.0 05/19/2022 1459   BASOSABS 0.0 09/30/2020 1605    DIAGNOSTIC IMAGING:  I have independently reviewed the scans and discussed with the patient. DG Bone Survey Met  Result Date: 05/21/2022 CLINICAL DATA:  Elevated serum immunoglobulins. EXAM: METASTATIC BONE SURVEY COMPARISON:  Osseous survey 04/30/2020. FINDINGS: No lytic or sclerotic lesion is identified in the axial or appendicular skeleton. Sclerosis about the SI joints from degenerative disease is again seen. Multilevel spondylosis in the spine is worst in the cervical and lower lumbar spine. Lungs are clear. Heart size is normal. Aortic atherosclerosis. Large volume of stool in the visualized colon is noted. IMPRESSION: Negative for lytic or sclerotic lesion. Multilevel spondylosis. Large colonic stool burden. Aortic Atherosclerosis (ICD10-I70.0).  Electronically Signed   By: Inge Rise M.D.   On: 05/21/2022 09:31     ASSESSMENT:  1.  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 -I reviewed her blood work from 05/08/2019.  Kappa light chains at 22.6 and ratio is 1.77.  CBC was normal.  SPEP was negative.  Creatinine was 0.8 and calcium 9.4. -Last skeletal survey on 10/20/2016 showed no new or enlarging suspicious lytic lesions.  Questionable subtle very small lucency in the proximal left humerus is stable. -Skeletal survey on 04/30/2020 with subcentimeter stable subtle indeterminate lucency in the proximal left humerus surgical neck area with no new or enlarging lytic lesions present.   PLAN:  1.  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  No other infections.  No new bone pains. - Reviewed labs from 05/19/2022 which showed creatinine 0.72, calcium 9.1.  M spike was negative.  Hemoglobin was normal.  Kappa light chains 23.4 and ratio of 2.44.  Immunofixation was unremarkable. - Skeletal survey was negative for lytic or sclerotic lesion.  Multilevel spondylosis. - No evidence of "crab" features. - RTC 1 year with repeat myeloma labs.  Will hold  off on doing another skeletal survey in a year.  Orders placed this encounter:  Orders Placed This Encounter  Procedures   CBC with Differential/Platelet   Comprehensive metabolic panel   Protein electrophoresis, serum   Kappa/lambda light chains   Immunofixation electrophoresis      Derek Jack, MD [facility] [phone]

## 2022-05-26 NOTE — Patient Instructions (Signed)
[name]  Discharge Instructions  You were seen and examined today by Dr. Delton Coombes.  Dr. Delton Coombes discussed your most recent lab work which revealed that everything looks good.  Follow-up as scheduled in 1 year.    Thank you for choosing [facility] to provide your oncology and hematology care.   To afford each patient quality time with our provider, please arrive at least 15 minutes before your scheduled appointment time. You may need to reschedule your appointment if you arrive late (10 or more minutes). Arriving late affects you and other patients whose appointments are after yours.  Also, if you miss three or more appointments without notifying the office, you may be dismissed from the clinic at the provider's discretion.    Again, thank you for choosing [facility].  Our hope is that these requests will decrease the amount of time that you wait before being seen by our physicians.   If you have a lab appointment with the [facility] please come in thru the Main Entrance and check in at the main information desk.           _____________________________________________________________  Should you have questions after your visit to [facility], please contact our office at [phone] and follow the prompts.  Our office hours are 8:00 a.m. to 4:30 p.m. Monday - Thursday and 8:00 a.m. to 2:30 p.m. Friday.  Please note that voicemails left after 4:00 p.m. may not be returned until the following business day.  We are closed weekends and all major holidays.  You do have access to a nurse 24-7, just call the main number to the clinic [phone] and do not press any options, hold on the line and a nurse will answer the phone.    For prescription refill requests, have your pharmacy contact our office and allow 72 hours.    Masks are optional in the cancer centers. If you would like for your care team  to wear a mask while they are taking care of you, please let them know. You may have one support person who is at least 73 years old accompany you for your appointments.

## 2022-06-14 ENCOUNTER — Other Ambulatory Visit: Payer: Self-pay | Admitting: Family Medicine

## 2022-07-23 ENCOUNTER — Other Ambulatory Visit: Payer: Self-pay | Admitting: Family Medicine

## 2022-08-20 ENCOUNTER — Other Ambulatory Visit: Payer: Self-pay | Admitting: Family Medicine

## 2022-08-21 ENCOUNTER — Other Ambulatory Visit: Payer: Self-pay | Admitting: Family Medicine

## 2022-09-07 ENCOUNTER — Ambulatory Visit: Payer: Medicare Other | Admitting: Internal Medicine

## 2022-09-13 NOTE — Progress Notes (Signed)
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  I last saw her on 12-31-21. At that time, she was experiencing constipation and was straining but her symptoms were improving for the prior 2-3 weeks as she was taking fiber. She was under significant stress as her mother had recently passed away.   Today, she complains of hemorrhoids and constipation. She reports experiencing some blood in stool on two occasions, one in December 2023 and one in January 2024. She reports taking benefiber and Miralax regularly for her constipation and has seen relief with it. She also complains of pain around the tailbone area and attributes it to lifting a basket of laundry. She states that it would worsen if she's laying down for long periods of time. She reports experiencing some unintentional weight loss.    She reports seeing a chiropractor till June 2023.   She reports experiencing depression and state that she's been having some stress due to family issues.   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 She states that she will be starting exercise at the senior facility.   GI Hx:  Colonoscopy 06/15/2017 - Redundant colon. - One 1 mm Hyperplastic polyp in the rectum, removed with a cold biopsy forceps. Resected and retrieved. - Non-bleeding internal hemorrhoids. - Diverticulosis in the sigmoid colon and in the ascending colon.  Colonoscopy 02-23-07 Hemorrhoids, External.   Current Outpatient Medications:     Apoaequorin (PREVAGEN EXTRA STRENGTH PO), Take 1 each by mouth daily., Disp: , Rfl:    Cholecalciferol (VITAMIN D3 PO), Take 5,000 Units by mouth daily., Disp: , Rfl:    clorazepate (TRANXENE) 7.5 MG tablet, Take 1 tablet by mouth twice daily as needed for anxiety, Disp: 60 tablet, Rfl: 0   hydrochlorothiazide (HYDRODIURIL) 25 MG tablet, Take 1 tablet (25 mg total) by mouth daily., Disp: 90 tablet, Rfl: 1   Omega-3 Fatty Acids (FISH OIL) 1000 MG CAPS, Take by mouth., Disp: , Rfl:    Polyethylene Glycol 3350 (MIRALAX PO), Take by mouth daily as needed., Disp: , Rfl:    Potassium 99 MG TABS, Take by mouth daily., Disp: , Rfl:    PSYLLIUM HUSK PO, Take by mouth. 1 capsule daily, Disp: , Rfl:    sertraline (ZOLOFT) 50 MG tablet, TAKE 1 & 1/2 (ONE & ONE-HALF) TABLETS BY MOUTH ONCE DAILY, Disp: 45 tablet, Rfl: 0   TURMERIC PO, Take 1 Dose by mouth daily as needed (supplement)., Disp: , Rfl:     Allergies as of 09/26/2022   (No Known Allergies)    Past Medical History:  Diagnosis Date   Adjustment disorder with mixed anxiety and depressed mood    Allergy    Carotid bruit    Depression    Dermatophytosis of foot    GERD (gastroesophageal reflux disease)    Hypertension    Insomnia    Neck pain    Osteoporosis     Past Surgical History:  Procedure Laterality Date  ABDOMINAL HYSTERECTOMY  1988   HEMORRHOID SURGERY     myoectomy  1984    Family History  Problem Relation Age of Onset   Memory loss Mother    Colon polyps Father    Prostate cancer Father    ALS Father    Thyroid disease Sister    Thyroid nodules Sister        Thyroidectomy in 1984   Fibroids Sister    Hypertension Sister    Pancreatic cancer Maternal Grandfather    Liver cancer Paternal Grandmother    Stomach cancer Neg Hx    Colon cancer Neg Hx    Esophageal cancer Neg Hx     Social History   Socioeconomic History   Marital status: Single    Spouse name: Not on file   Number of children: 0   Years of  education: 12+   Highest education level: 12th grade  Occupational History   Occupation: caregiver  Tobacco Use   Smoking status: Former    Types: Cigarettes   Smokeless tobacco: Never  Vaping Use   Vaping Use: Never used  Substance and Sexual Activity   Alcohol use: Yes    Comment: occasional wine   Drug use: No   Sexual activity: Not Currently  Other Topics Concern   Not on file  Social History Narrative   Lives alone with Mother who has dementia    Social Determinants of Health   Financial Resource Strain: Medium Risk (01/11/2022)   Overall Financial Resource Strain (CARDIA)    Difficulty of Paying Living Expenses: Somewhat hard  Food Insecurity: Food Insecurity Present (01/11/2022)   Hunger Vital Sign    Worried About Running Out of Food in the Last Year: Sometimes true    Ran Out of Food in the Last Year: Sometimes true  Transportation Needs: No Transportation Needs (01/11/2022)   PRAPARE - Administrator, Civil Service (Medical): No    Lack of Transportation (Non-Medical): No  Physical Activity: Inactive (01/11/2022)   Exercise Vital Sign    Days of Exercise per Week: 0 days    Minutes of Exercise per Session: 0 min  Stress: Stress Concern Present (01/11/2022)   [employer] of Occupational Health - Occupational Stress Questionnaire    Feeling of Stress : Rather much  Social Connections: Socially Isolated (01/11/2022)   Social Connection and Isolation Panel [NHANES]    Frequency of Communication with Friends and Family: Twice a week    Frequency of Social Gatherings with Friends and Family: Never    Attends Religious Services: Never    Database administrator or Organizations: No    Attends Banker Meetings: Never    Marital Status: Divorced  Catering manager Violence: Not At Risk (01/11/2022)   Humiliation, Afraid, Rape, and Kick questionnaire    Fear of Current or Ex-Partner: No    Emotionally Abused: No    Physically Abused: No     Sexually Abused: No      Review of systems: Review of Systems  Constitutional:  Positive for unexpected weight change.  Gastrointestinal:  Positive for anal bleeding, blood in stool and constipation. Negative for abdominal distention, abdominal pain, diarrhea, nausea, rectal pain and vomiting.  Musculoskeletal:  Positive for back pain.    Physical Exam: General: well-appearing   Eyes: sclera anicteric, no redness ENT: oral mucosa moist without lesions, no cervical or supraclavicular lymphadenopathy CV: RRR, no JVD, no peripheral edema Resp: clear to auscultation bilaterally, normal RR  and effort noted GI: soft, no tenderness, with active bowel sounds. No guarding or palpable organomegaly noted. Skin tag in rectum.  Skin; warm and dry, no rash or jaundice noted Neuro: awake, alert and oriented x 3. Normal gross motor function and fluent speech   Data Reviewed:  Reviewed labs, radiology imaging, old records and pertinent past GI work up   Assessment and Plan/Recommendations:  72-year-old very pleasant female with history of chronic irritable bowel syndrome with constipation.  Symptomatic internal hemorrhoids status post hemorrhoid band ligation X3.  Continue MiraLAX daily with goal 1-2 soft bowel movements Fiber 1 to 2 teaspoons 3 times daily Increase water intake  Small internal hemorrhoids on anorectal exam, advised patient to avoid excessive straining during defecation Use Anusol cream small pea-sized amount per rectum as needed  Return in 1 year   -continue Miralax and Benefiber.   This visit required >60 minutes of patient care (this includes precharting, chart review, review of results, face-to-face time used for counseling as well as treatment plan and follow-up. The patient was provided an opportunity to ask questions and all were answered. The patient agreed with the plan and demonstrated an understanding of the instructions.  I,Safa M Kadhim,acting as a scribe for  Marsa ArisKavitha Remell Giaimo, MD.,have documented all relevant documentation on the behalf of Marsa ArisKavitha Shauntia Levengood, MD,as directed by  Marsa ArisKavitha Danashia Landers, MD while in the presence of Marsa ArisKavitha Alpheus Stiff, MD.   I, Marsa ArisKavitha Paylin Hailu, MD, have reviewed all documentation for this visit. The documentation on 09/26/22 for the exam, diagnosis, procedures, and orders are all accurate and complete.   Iona BeardK. Veena Keeton Kassebaum , MD    CC: Tiffany PerchesSimpson, Tiffany E, MD

## 2022-09-26 ENCOUNTER — Ambulatory Visit (INDEPENDENT_AMBULATORY_CARE_PROVIDER_SITE_OTHER): Payer: Medicare Other | Admitting: Gastroenterology

## 2022-09-26 ENCOUNTER — Encounter: Payer: Self-pay | Admitting: Gastroenterology

## 2022-09-26 VITALS — BP 128/70 | HR 75 | Ht 66.0 in | Wt 165.0 lb

## 2022-09-26 DIAGNOSIS — K5904 Chronic idiopathic constipation: Secondary | ICD-10-CM | POA: Diagnosis not present

## 2022-09-26 DIAGNOSIS — K649 Unspecified hemorrhoids: Secondary | ICD-10-CM

## 2022-09-26 NOTE — Patient Instructions (Addendum)
We will see you in a year for a follow up!  Please continue Miralax/ benefiber.   _______________________________________________________  If your blood pressure at your visit was 140/90 or greater, please contact your primary care physician to follow up on this.  _______________________________________________________  If you are age 74 or older, your body mass index should be between 23-30. Your Body mass index is 26.63 kg/m. If this is out of the aforementioned range listed, please consider follow up with your Primary Care Provider.  If you are age 55 or younger, your body mass index should be between 19-25. Your Body mass index is 26.63 kg/m. If this is out of the aformentioned range listed, please consider follow up with your Primary Care Provider.   ________________________________________________________  The [facility] providers would like to encourage you to use [facility] to communicate with providers for non-urgent requests or questions.  Due to long hold times on the telephone, sending your provider a message by [facility] may be a faster and more efficient way to get a response.  Please allow 48 business hours for a response.  Please remember that this is for non-urgent requests.  _______________________________________________________  It was a pleasure to see you today!  Thank you for trusting me with your gastrointestinal care!

## 2022-09-27 ENCOUNTER — Ambulatory Visit (INDEPENDENT_AMBULATORY_CARE_PROVIDER_SITE_OTHER): Payer: Medicare Other | Admitting: Family Medicine

## 2022-09-27 ENCOUNTER — Encounter: Payer: Self-pay | Admitting: Family Medicine

## 2022-09-27 VITALS — BP 135/80 | HR 71 | Ht 67.0 in | Wt 166.0 lb

## 2022-09-27 DIAGNOSIS — E785 Hyperlipidemia, unspecified: Secondary | ICD-10-CM | POA: Diagnosis not present

## 2022-09-27 DIAGNOSIS — F418 Other specified anxiety disorders: Secondary | ICD-10-CM

## 2022-09-27 DIAGNOSIS — I10 Essential (primary) hypertension: Secondary | ICD-10-CM | POA: Diagnosis not present

## 2022-09-27 DIAGNOSIS — F5104 Psychophysiologic insomnia: Secondary | ICD-10-CM

## 2022-09-27 DIAGNOSIS — E559 Vitamin D deficiency, unspecified: Secondary | ICD-10-CM

## 2022-09-27 DIAGNOSIS — Z59819 Housing instability, housed unspecified: Secondary | ICD-10-CM | POA: Diagnosis not present

## 2022-09-27 DIAGNOSIS — R768 Other specified abnormal immunological findings in serum: Secondary | ICD-10-CM

## 2022-09-27 DIAGNOSIS — F4321 Adjustment disorder with depressed mood: Secondary | ICD-10-CM

## 2022-09-27 DIAGNOSIS — Z13220 Encounter for screening for lipoid disorders: Secondary | ICD-10-CM

## 2022-09-27 MED ORDER — CLORAZEPATE DIPOTASSIUM 7.5 MG PO TABS: 7.5000 mg | ORAL_TABLET | Freq: Two times a day (BID) | ORAL | 5 refills | Status: DC | PRN

## 2022-09-27 MED ORDER — HYDROCHLOROTHIAZIDE 25 MG PO TABS: 25.0000 mg | ORAL_TABLET | Freq: Every day | ORAL | 1 refills | Status: DC

## 2022-09-27 MED ORDER — SERTRALINE HCL 50 MG PO TABS: ORAL_TABLET | ORAL | 5 refills | Status: DC

## 2022-09-27 NOTE — Progress Notes (Signed)
Tiffany Mendoza     MRN: [id number]      DOB: Jul 07, 1950   HPI Tiffany Mendoza is here for follow up and re-evaluation of chronic medical conditions, medication management and review of any available recent lab and radiology data.  Preventive health is updated, specifically  Cancer screening and Immunization.   Questions or concerns regarding consultations or procedures which the PT has had in the interim are  addressed. The PT denies any adverse reactions to current medications since the last visit.  There are no new concerns.  There are no specific complaints   ROS Denies recent fever or chills. Denies sinus pressure, nasal congestion, ear pain or sore throat. Denies chest congestion, productive cough or wheezing. Denies chest pains, palpitations and leg swelling Denies abdominal pain, nausea, vomiting,diarrhea or constipation.   Denies dysuria, frequency, hesitancy or incontinence. Denies joint pain, swelling and limitation in mobility. Denies headaches, seizures, numbness, or tingling. Denies uncontrolled  depression, anxiety or insomnia. Denies skin break down or rash.   PE  BP 135/80 (BP Location: Right Arm, Patient Position: Sitting, Cuff Size: Large)   Pulse 71   Ht 5\' 7"  (1.702 m)   Wt 166 lb 0.6 oz (75.3 kg)   SpO2 96%   BMI 26.01 kg/m   Patient alert and oriented and in no cardiopulmonary distress.  HEENT: No facial asymmetry, EOMI,     Neck supple .  Chest: Clear to auscultation bilaterally.  CVS: S1, S2 no murmurs, no S3.Regular rate.  ABD: Soft non tender.   Ext: No edema  MS: Adequate ROM spine, shoulders, hips and knees.  Skin: Intact, no ulcerations or rash noted.  Psych: Good eye contact, normal affect. Memory intact not anxious or depressed appearing.  CNS: CN 2-12 intact, power,  normal throughout.no focal deficits noted.   Assessment & Plan   BP 135/80 (BP Location: Right Arm, Patient Position: Sitting, Cuff Size: Large)   Pulse 71   Ht  5\' 7"  (1.702 m)   Wt 166 lb 0.6 oz (75.3 kg)   SpO2 96%   BMI 26.01 kg/m   Patient alert and oriented and in no cardiopulmonary distress.  HEENT: No facial asymmetry, EOMI,     Neck supple .  Chest: Clear to auscultation bilaterally.  CVS: S1, S2 no murmurs, no S3.Regular rate.  ABD: Soft non tender.   Ext: No edema  MS: Adequate ROM spine, shoulders, hips and knees.  Skin: Intact, no ulcerations or rash noted.  Psych: Good eye contact, normal affect. Memory intact not anxious or depressed appearing.  CNS: CN 2-12 intact, power,  normal throughout.no focal deficits noted.   Assessment & Plan  Essential hypertension Controlled, no change in medication DASH diet and commitment to daily physical activity for a minimum of 30 minutes discussed and encouraged, as a part of hypertension management. The importance of attaining a healthy weight is also discussed.     09/27/2022    2:58 PM 09/26/2022    2:48 PM 05/26/2022    3:50 PM 05/26/2022    3:36 PM 02/10/2022    2:03 PM 12/31/2021    2:54 PM 11/10/2021    3:24 PM  BP/Weight  Systolic BP 135 128 160  135 118 150  Diastolic BP 80 70 91  82 82 85  Wt. (Lbs) 166.04 165 164 164.02 162.8 161 164  BMI 26.01 kg/m2 26.63 kg/m2 25.69 kg/m2 25.69 kg/m2 25.5 kg/m2 25.22 kg/m2 25.69 kg/m2       Housing  insecurity Ongoing challenge, however she is working on this  Insomnia Sleep hygiene reviewed and written information offered also. Prescription sent for  medication needed. Controlled, no change in medication   Grief Resolving with time, mother deceased over  6 months ago  Depression with anxiety Controlled, no change in medication Reports good support from friends and still has access to therapy  Elevated serum immunoglobulin free light chain level Followed by heme / onc

## 2022-09-27 NOTE — Patient Instructions (Addendum)
F/U in 6 months, call if you need me sooner  Fasting lipid, cmp and EGFR, tSH and vit D today   Please get mammogram due in  September through your gyne office as usual  Continue to make healthy food choice , mainly vegetables and fruit , not much canned food ,  processed foods, fried foods, work on 64 ounces water daily  Continue to push forward  You cannot control peoples behavior , but you can control how you react to the behavior, continue to push forward   Need to get covid vaccine at your pharmacy  Thanks for choosing [facility], we consider it a privelige to serve you.

## 2022-09-28 LAB — LIPID PANEL
Chol/HDL Ratio: 2.9 ratio (ref 0.0–4.4)
Cholesterol, Total: 186 mg/dL (ref 100–199)
HDL: 64 mg/dL (ref >39)
LDL Chol Calc (NIH): 109 mg/dL — ABNORMAL HIGH (ref 0–99)
Triglycerides: 69 mg/dL (ref 0–149)
VLDL Cholesterol Cal: 13 mg/dL (ref 5–40)

## 2022-09-28 LAB — TSH: TSH: 2.27 u[IU]/mL (ref 0.450–4.500)

## 2022-09-28 LAB — CMP14+EGFR
ALT: 18 IU/L (ref 0–32)
AST: 19 IU/L (ref 0–40)
Albumin/Globulin Ratio: 1.5 (ref 1.2–2.2)
Albumin: 4.4 g/dL (ref 3.8–4.8)
Alkaline Phosphatase: 59 IU/L (ref 44–121)
BUN/Creatinine Ratio: 11 — ABNORMAL LOW (ref 12–28)
BUN: 8 mg/dL (ref 8–27)
Bilirubin Total: 0.6 mg/dL (ref 0.0–1.2)
CO2: 25 mmol/L (ref 20–29)
Calcium: 9.8 mg/dL (ref 8.7–10.3)
Chloride: 100 mmol/L (ref 96–106)
Creatinine, Ser: 0.75 mg/dL (ref 0.57–1.00)
Globulin, Total: 3 g/dL (ref 1.5–4.5)
Glucose: 104 mg/dL — ABNORMAL HIGH (ref 70–99)
Potassium: 3.8 mmol/L (ref 3.5–5.2)
Sodium: 140 mmol/L (ref 134–144)
Total Protein: 7.4 g/dL (ref 6.0–8.5)
eGFR: 83 mL/min/{1.73_m2} (ref >59)

## 2022-09-28 LAB — VITAMIN D 25 HYDROXY (VIT D DEFICIENCY, FRACTURES): Vit D, 25-Hydroxy: 51.3 ng/mL (ref 30.0–100.0)

## 2022-09-29 ENCOUNTER — Encounter: Payer: Self-pay | Admitting: Gastroenterology

## 2022-10-02 ENCOUNTER — Encounter: Payer: Self-pay | Admitting: Family Medicine

## 2022-10-02 NOTE — Assessment & Plan Note (Signed)
Resolving with time, mother deceased over  6 months ago

## 2022-10-02 NOTE — Assessment & Plan Note (Signed)
Controlled, no change in medication Reports good support from friends and still has access to therapy

## 2022-10-02 NOTE — Assessment & Plan Note (Signed)
Followed by heme/ onc 

## 2022-10-02 NOTE — Assessment & Plan Note (Signed)
Controlled, no change in medication DASH diet and commitment to daily physical activity for a minimum of 30 minutes discussed and encouraged, as a part of hypertension management. The importance of attaining a healthy weight is also discussed.     09/27/2022    2:58 PM 09/26/2022    2:48 PM 05/26/2022    3:50 PM 05/26/2022    3:36 PM 02/10/2022    2:03 PM 12/31/2021    2:54 PM 11/10/2021    3:24 PM  BP/Weight  Systolic BP 135 128 160  135 118 150  Diastolic BP 80 70 91  82 82 85  Wt. (Lbs) 166.04 165 164 164.02 162.8 161 164  BMI 26.01 kg/m2 26.63 kg/m2 25.69 kg/m2 25.69 kg/m2 25.5 kg/m2 25.22 kg/m2 25.69 kg/m2

## 2022-10-02 NOTE — Assessment & Plan Note (Signed)
Sleep hygiene reviewed and written information offered also. Prescription sent for  medication needed. Controlled, no change in medication  

## 2022-10-02 NOTE — Assessment & Plan Note (Signed)
Ongoing challenge, however she is working on this

## 2022-10-13 ENCOUNTER — Ambulatory Visit: Payer: Medicare Other | Admitting: Nurse Practitioner

## 2022-10-25 ENCOUNTER — Encounter: Payer: Self-pay | Admitting: Family Medicine

## 2022-10-25 ENCOUNTER — Ambulatory Visit (INDEPENDENT_AMBULATORY_CARE_PROVIDER_SITE_OTHER): Payer: Medicare Other | Admitting: Family Medicine

## 2022-10-25 ENCOUNTER — Telehealth: Payer: Self-pay | Admitting: *Deleted

## 2022-10-25 VITALS — BP 137/85 | HR 81 | Ht 67.0 in | Wt 160.0 lb

## 2022-10-25 DIAGNOSIS — I10 Essential (primary) hypertension: Secondary | ICD-10-CM | POA: Diagnosis not present

## 2022-10-25 DIAGNOSIS — Z59819 Housing instability, housed unspecified: Secondary | ICD-10-CM | POA: Diagnosis not present

## 2022-10-25 NOTE — Progress Notes (Signed)
   Tiffany Mendoza     MRN: [id number]      DOB: March 13, 1951  Chief Complaint  Patient presents with   Follow-up    Feeling fatigued packing up her belongings at her sisters house    HPI Tiffany Mendoza is here for follow up and re-evaluation of chronic medical conditions, medication management and review of any available recent lab and radiology data.  Preventive health is updated, specifically  Cancer screening and Immunization.   Questions or concerns regarding consultations or procedures which the PT has had in the interim are  addressed. Still having a problem finding an affordable place to live, says she has  support  financial from nephew and grand nephew shows some care and / concern  Continues to state both sisters keep harassing her to move out esp the younger sister who the house is left to, wonders if she can get a letter of advocacy ??on her belf unable to healgrieving Mother's loss  10 months ago, who she cared for, single, no income, felt lost and diid not know what she will do when mother dies, funds , moving, could only focus on grief Still has a lot of interest in many simple things Needs somewhere to move inmto with limited funds, will refer to [place] this past Friday, 5 days ago, she was so overwhelmed ahe had toughts that she would be better off not living , no plans to commit either suicidae or homicide ROS See HPI  PE  BP 137/85 (BP Location: Right Arm, Patient Position: Sitting, Cuff Size: Large)   Pulse 81   Ht 5\' 7"  (1.702 m)   Wt 160 lb (72.6 kg)   SpO2 94%   BMI 25.06 kg/m   Patient alert and oriented and in no cardiopulmonary distress.  HEENT: No facial asymmetry, EOMI,     Neck supple .   MS: Adequate ROM spine, shoulders, hips and knees.  Skin: Intact, no ulcerations or rash noted.  Psych: Good eye contact, flat  affect. Memory intact mildly  anxious and depressed appearing.  CNS: CN 2-12 intact, power,  normal throughout.no focal deficits  noted.   Assessment & Plan Housing insecurity Increased urgent need to vacate her Mother's home reports pressure by oth sisters to do so. Economically challenged with  this will [facility]urgently  Essential hypertension DASH diet and commitment to daily physical activity for a minimum of 30 minutes discussed and encouraged, as a part of hypertension management. The importance of attaining a healthy weight is also discussed.     10/25/2022    2:59 PM 09/27/2022    2:58 PM 09/26/2022    2:48 PM 05/26/2022    3:50 PM 05/26/2022    3:36 PM 02/10/2022    2:03 PM 12/31/2021    2:54 PM  BP/Weight  Systolic BP 137 135 128 160  135 118  Diastolic BP 85 80 70 91  82 82  Wt. (Lbs) 160 166.04 165 164 164.02 162.8 161  BMI 25.06 kg/m2 26.01 kg/m2 26.63 kg/m2 25.69 kg/m2 25.69 kg/m2 25.5 kg/m2 25.22 kg/m2     No med change

## 2022-10-25 NOTE — Assessment & Plan Note (Signed)
Increased urgent need to vacate her Mother's home reports pressure by oth sisters to do so. Economically challenged with  this will request [facility] inputurgently

## 2022-10-25 NOTE — Assessment & Plan Note (Signed)
DASH diet and commitment to daily physical activity for a minimum of 30 minutes discussed and encouraged, as a part of hypertension management. The importance of attaining a healthy weight is also discussed.     10/25/2022    2:59 PM 09/27/2022    2:58 PM 09/26/2022    2:48 PM 05/26/2022    3:50 PM 05/26/2022    3:36 PM 02/10/2022    2:03 PM 12/31/2021    2:54 PM  BP/Weight  Systolic BP 137 135 128 160  135 118  Diastolic BP 85 80 70 91  82 82  Wt. (Lbs) 160 166.04 165 164 164.02 162.8 161  BMI 25.06 kg/m2 26.01 kg/m2 26.63 kg/m2 25.69 kg/m2 25.69 kg/m2 25.5 kg/m2 25.22 kg/m2     No med change

## 2022-10-25 NOTE — Patient Instructions (Signed)
F/U in 8 to 10 weeks call if you need  me sooner  I will reach out to [facility] for help with housing  Continue therapy   These are your needs  I am unable to write a letter for your sisters

## 2022-10-25 NOTE — Progress Notes (Signed)
  Care Coordination  Outreach Note  10/25/2022 Name: HADE DIMITRIOU MRN: [id number] DOB: 1950-08-11   Care Coordination Outreach Attempts: An unsuccessful telephone outreach was attempted today to offer the patient information about available care coordination services.  Follow Up Plan:  Additional outreach attempts will be made to offer the patient care coordination information and services.   Encounter Outcome:  No Answer  Christie Nottingham  Care Coordination Care Guide  Direct Dial: [phone]

## 2022-10-26 ENCOUNTER — Telehealth: Payer: Self-pay | Admitting: *Deleted

## 2022-10-26 ENCOUNTER — Telehealth: Payer: Self-pay | Admitting: Family Medicine

## 2022-10-26 NOTE — Progress Notes (Signed)
  Care Coordination  Outreach Note  10/26/2022 Name: Tiffany Mendoza MRN: [id number] DOB: Sep 09, 1950   Care Coordination Outreach Attempts: An unsuccessful telephone outreach was attempted today to offer the patient information about available care coordination services.  Follow Up Plan:  Additional outreach attempts will be made to offer the patient care coordination information and services.   Encounter Outcome:  No Answer  Burman Nieves, CCMA Care Coordination Care Guide Direct Dial: [phone]

## 2022-10-26 NOTE — Telephone Encounter (Signed)
Pt called in feeling as though she may have chronic fatigue syndrome  Has had this before. Has been laying down all day. Wants a cll back. Wants to speak and discuss what previous providers had said about the syndrome.

## 2022-10-28 NOTE — Progress Notes (Signed)
  Care Coordination   Note   10/28/2022 Name: Tiffany Mendoza MRN: [id number] DOB: 01-Mar-1951  Tiffany Mendoza is a 72 y.o. year old female who sees [name], [name], MD for primary care. I reached out to [name] by phone today to offer care coordination services.  Ms. Savard was given information about Care Coordination services today including:   The Care Coordination services include support from the care team which includes your Nurse Coordinator, Clinical Social Worker, or Pharmacist.  The Care Coordination team is here to help remove barriers to the health concerns and goals most important to you. Care Coordination services are voluntary, and the patient may decline or stop services at any time by request to their care team member.   Care Coordination Consent Status: Patient agreed to services and verbal consent obtained.   Follow up plan:  Telephone appointment with care coordination team member scheduled for:  11/08/22  Encounter Outcome:  Pt. Scheduled  [facility] Coordination Care Guide  Direct Dial: [phone]

## 2022-10-28 NOTE — Progress Notes (Signed)
  Care Coordination   Note   10/28/2022 Name: ALMERINDA HACHE MRN: [id number] DOB: Feb 18, 1951  SHAYLE AGE is a 72 y.o. year old female who sees [name], [name], MD for primary care. I reached out to [name] by phone today to offer care coordination services.  Ms. Hoole was given information about Care Coordination services today including:   The Care Coordination services include support from the care team which includes your Nurse Coordinator, Clinical Social Worker, or Pharmacist.  The Care Coordination team is here to help remove barriers to the health concerns and goals most important to you. Care Coordination services are voluntary, and the patient may decline or stop services at any time by request to their care team member.   Care Coordination Consent Status: Patient agreed to services and verbal consent obtained.   Follow up plan:  Telephone appointment with care coordination team member scheduled for:  10/31/2022  Encounter Outcome:  Pt. Scheduled from referral   Burman Nieves, [facility] Care Coordination Care Guide Direct Dial: [phone]

## 2022-10-31 ENCOUNTER — Ambulatory Visit: Payer: Self-pay

## 2022-10-31 NOTE — Patient Instructions (Signed)
Visit Information  Thank you for taking time to visit with me today. Please don't hesitate to contact me if I can be of assistance to you.   Following are the goals we discussed today:   Goals Addressed             This Visit's Progress    Housing       -Patient request SW speak to nephew who mediates between patient and sister who owns the home -Patient agreed to consider Public Housing application         If you are experiencing a Mental Health or Behavioral Health Crisis or need someone to talk to, please call 911  Patient verbalizes understanding of instructions and care plan provided today and agrees to view in [place]. Active MyChart status and patient understanding of how to access instructions and care plan via MyChart confirmed with patient.     No further follow up required: Until after conversation with nephew Jamse Mead III. Lysle Morales, BSW Social Worker [facility] Care Management  [phone]

## 2022-10-31 NOTE — Patient Outreach (Signed)
  Care Coordination   Initial Visit Note   10/31/2022 Name: MILLANA STAHL MRN: [id number] DOB: 1950-11-03  LENNYN MIDDAUGH is a 72 y.o. year old female who sees [name], [name], MD for primary care. I spoke with  [name] by phone today.  What matters to the patients health and wellness today?  Patient needs to find affordable housing.  She lives in a home owned by her sister and has been asked to move.  Patient has found a new housing development and is on the waiting list to start the application process in September or by the end of the year.  Patient does not have a plan for housing if her sister asks that she leave soon.  Patient provided verbal consent to have SW speak to her nephew who mediates between the 2 sisters.  Patient rec's SSA and FNS and is able to care for herself and perform her ADL's.  Patient has explored friends and family for housing but no one can allow her to stay.   Goals Addressed             This Visit's Progress    Housing       -Patient request SW speak to nephew who mediates between patient and sister who owns the home -Patient agreed to consider Public Housing application         SDOH assessments and interventions completed:  Yes  SDOH Interventions Today    Flowsheet Row Most Recent Value  SDOH Interventions   Food Insecurity Interventions Intervention Not Indicated  [Rec's Foodstamps]  Housing Interventions Other (Comment)  [Recommended public housing, low income housing]  Financial Strain Interventions Intervention Not Indicated        Care Coordination Interventions:  Yes, provided   Follow up plan:  Patient will contact nephew Jamse Mead III to contact SW to explain limitations on finding housing.  An additional call will be made to patient once the nephew calls.    Encounter Outcome:  Pt. Visit Completed

## 2022-11-01 ENCOUNTER — Ambulatory Visit: Payer: Self-pay

## 2022-11-01 NOTE — Patient Outreach (Signed)
  Care Coordination   Follow Up Visit Note   11/01/2022 Name: ODDIE HWANG MRN: [id number] DOB: 24-Apr-1951  RANA BUHAY is a 72 y.o. year old female who sees [name], [name], MD for primary care. I spoke with  [name] by phone today.  What matters to the patients health and wellness today?  Patient called to inform SW that she spoke to her nephew Lucendia Herrlich and  requests SW call him at [phone].  Spoke to nephew and explain housing crisis and suggested Geneticist, molecular.  Nephew agreed to assist with online application and will explain the limitations to affordable housing to his aunt that owns the home.  The nephew will speak with family and suggests f/up with patient May 29.  Nephew has agreed to financially assist once patient is ready to move.  SW explained patient would be homeless if she is provided a 30 day notice to vacate the home and SW would work with her on shelter options.   Goals Addressed   None     SDOH assessments and interventions completed:  No     Care Coordination Interventions:  Yes, provided   -SW emailed Lucendia Herrlich the link to apply with [employer].  Follow up plan: Follow up call scheduled for May 29    Encounter Outcome:  Pt. Visit Completed

## 2022-11-01 NOTE — Patient Instructions (Signed)
Visit Information  Thank you for taking time to visit with me today. Please don't hesitate to contact me if I can be of assistance to you.   Following are the goals we discussed today:   Goals Addressed   None     Our next appointment is by telephone on May 29,2004 at 11am  Please call the care guide team at [phone] if you need to cancel or reschedule your appointment.   If you are experiencing a Mental Health or Behavioral Health Crisis or need someone to talk to, please call 911  Patient verbalizes understanding of instructions and care plan provided today and agrees to view in MyChart. Active MyChart status and patient understanding of how to access instructions and care plan via MyChart confirmed with patient.     Telephone follow up appointment with care management team member scheduled for: Nov 16, 2022  Lysle Morales, [place] Social Worker [facility] Care Management  [phone]

## 2022-11-04 ENCOUNTER — Telehealth: Payer: Self-pay | Admitting: *Deleted

## 2022-11-04 NOTE — Progress Notes (Unsigned)
  Care Coordination Note  11/04/2022 Name: Tiffany Mendoza MRN: [id number] DOB: [birth year]  Tiffany Mendoza is a 72 y.o. year old female who is a primary care patient of Kerri Perches, MD and is actively engaged with the care management team. I reached out to [name] by phone today to assist with re-scheduling an initial visit with the RN Case Manager  Follow up plan: Unsuccessful telephone outreach attempt made. A HIPAA compliant phone message was left for the patient providing contact information and requesting a return call.   [facility]  Care Coordination Care Guide  Direct Dial: [phone]

## 2022-11-07 NOTE — Progress Notes (Signed)
  Care Coordination Note  11/07/2022 Name: Tiffany Mendoza MRN: [id number] DOB: 11-10-1950  Tiffany Mendoza is a 72 y.o. year old female who is a primary care patient of Kerri Perches, MD and is actively engaged with the care management team. I reached out to [name] by phone today to assist with scheduling an initial visit with the RN Case Manager  Follow up plan: Telephone appointment with care management team member scheduled for:11/22/22  [facility] Coordination Care Guide  Direct Dial: [phone]

## 2022-11-08 ENCOUNTER — Encounter: Payer: Medicare Other | Admitting: *Deleted

## 2022-11-16 ENCOUNTER — Ambulatory Visit: Payer: Self-pay

## 2022-11-16 NOTE — Patient Instructions (Signed)
Visit Information  Thank you for taking time to visit with me today. Please don't hesitate to contact me if I can be of assistance to you.   Following are the goals we discussed today:   Goals Addressed             This Visit's Progress    Housing       -Patient request SW speak to nephew who mediates between patient and sister who owns the home -Patient agreed to consider Public Housing application   11/16/22 -Patient will apply for public housing on her own and follow up with nephew if needed -Patient will continue with the primary plan of waiting to apply for new apartment complex in September         Our next appointment is by telephone on 12/15/22 at 11am  Please call the care guide team at [phone] if you need to cancel or reschedule your appointment.   If you are experiencing a Mental Health or Behavioral Health Crisis or need someone to talk to, please call 911  Patient verbalizes understanding of instructions and care plan provided today and agrees to view in MyChart. Active MyChart status and patient understanding of how to access instructions and care plan via MyChart confirmed with patient.     Telephone follow up appointment with care management team member scheduled for:12/15/22 at 11am

## 2022-11-16 NOTE — Patient Outreach (Signed)
  Care Coordination   Follow Up Visit Note   11/16/2022 Name: WILLODEEN HOWITT MRN: [id number] DOB: 15-Apr-1951  DARNESHIA LEVITZ is a 72 y.o. year old female who sees [name], [name], MD for primary care. I spoke with  [name] by phone today.  What matters to the patients health and wellness today?  Patient is initially reluctant to apply for [employer]  SW explains the application process and how public housing is designed.  Patient agreed to start the online application process.      Goals Addressed             This Visit's Progress    Housing       -Patient request SW speak to nephew who mediates between patient and sister who owns the home -Patient agreed to consider Public Housing application   11/16/22 -Patient will apply for public housing on her own and follow up with nephew if needed -Patient will continue with the primary plan of waiting to apply for new apartment complex in September         SDOH assessments and interventions completed:  Yes     Care Coordination Interventions:  Yes, provided  Interventions Today    Flowsheet Row Most Recent Value  General Interventions   General Interventions Discussed/Reviewed General Interventions Reviewed, [employer] housing plan, explained public housing application process]       Follow up plan: Follow up call scheduled for 12/15/22 at 11am    Encounter Outcome:  Pt. Visit Completed

## 2022-11-18 ENCOUNTER — Ambulatory Visit: Payer: Self-pay

## 2022-11-18 NOTE — Patient Outreach (Signed)
  Care Coordination   Follow Up Visit Note   11/18/2022 Name: Tiffany Mendoza MRN: [id number] DOB: Jul 19, 1950  Tiffany Mendoza is a 72 y.o. year old female who sees [name], [name], MD for primary care. I spoke with  [name] by phone today.  What matters to the patients health and wellness today?  Patient attempted to log into [facility] online housing portal but her computer is restricting access because it is not a secure site.  Patient has not reached out to her nephew to assist with the application process.  SW sent link to page but clients computer does not allow access.  SW provided [facility] number to call [phone] for an appointment to complete the application.  Patient will contact nephew if the appointment is a long wait.    Goals Addressed   None     SDOH assessments and interventions completed:  No     Care Coordination Interventions:  Yes, provided  Interventions Today    Flowsheet Row Most Recent Value  General Interventions   General Interventions Discussed/Reviewed General Interventions Reviewed, Community Resources  [facility] online application portal is not working, SW suggest making apt]       Follow up plan: Follow up call scheduled for 6//27/24 at 11am    Encounter Outcome:  Pt. Visit Completed

## 2022-11-21 ENCOUNTER — Ambulatory Visit: Payer: Self-pay

## 2022-11-21 NOTE — Patient Outreach (Signed)
  Care Coordination   Follow Up Visit Note   11/21/2022 Name: REYHAN LYND MRN: [id number] DOB: 10/21/1950  TARYA MCCOSH is a 72 y.o. year old female who sees [name], [name], MD for primary care. I spoke with  [name] by phone today.  What matters to the patients health and wellness today?  Patient reports that she has contacted [employer] and spoke to [employer] on 11/18/22.  Ms Mertie Clause agreed to return her call on 5/31 to complete the application but did not call.  Patient will call back today to follow up.  Patient decide not contact her nephew for assistance at this time.    Goals Addressed   None     SDOH assessments and interventions completed:  No     Care Coordination Interventions:  No, not indicated   Follow up plan: No further intervention required.   Encounter Outcome:  Pt. Visit Completed

## 2022-11-22 ENCOUNTER — Ambulatory Visit: Payer: Self-pay | Admitting: *Deleted

## 2022-11-22 ENCOUNTER — Encounter: Payer: Self-pay | Admitting: *Deleted

## 2022-11-22 NOTE — Patient Outreach (Signed)
  Care Coordination   Initial Visit Note   11/22/2022 Name: MONDAY AMTHOR MRN: [id number] DOB: 31-Aug-1950  Tiffany Mendoza is a 72 y.o. year old female who sees [name], [name], MD for primary care. I spoke with  [name] by phone today.  What matters to the patients health and wellness today?  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 Reports her pcp noted changes in her related to fatigue, GI disturbances, anxiety, tearfulness during an office visit She now confirms managing depression/grief with her faith, high school friends, getting out of the home, positive speech/affirmation, speaking with other family members, alternative care resources- massage, chiropractor  Recent housing application with [employer] on 11/21/22 + alternative plan at [place] apartments Now she voices her understanding of public housing vs [address]  Concerns with [employer] my chart  Confirms she is now having high school friends who follow up with her Her stomach concerns [employer] sleep   Goals Addressed             This Visit's Progress    [facility] nurse care coordination service   On track    Interventions Today    Flowsheet Row Most Recent Value  Chronic Disease   Chronic disease during today's visit Hypertension (HTN), Other  [depression, housing insecurity]  General Interventions   General Interventions Discussed/Reviewed General Interventions Discussed  Exercise Interventions   Exercise Discussed/Reviewed Exercise Discussed  Education Interventions   Education Provided --  [discussed what internet cookies are, Stress, sleep apnea, magnesium]  Mental Health Interventions   Mental Health Discussed/Reviewed Mental Health Discussed, Anxiety, Grief and Loss, Depression, Coping Strategies,  Other  [active listening, Veronica Redcliff at [employer],, 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 Encouraged to ask for help]  Pharmacy Interventions   Pharmacy Dicussed/Reviewed Pharmacy Topics Discussed             SDOH assessments and interventions completed:  Yes  SDOH Interventions Today    Flowsheet Row Most Recent Value  SDOH Interventions   Housing Interventions AMB Referral  [active with [facility] SW for housing insecurity]  Utilities Interventions Intervention Not Indicated  Stress Interventions Intervention Not Indicated, Other (Comment)  [involved with [facility] SW Family dynamics]        Care Coordination Interventions:  Yes, provided   Follow up plan: Follow up call scheduled for 12/06/22    Encounter Outcome:  Pt. Visit Completed   Joslynne Klatt L. Noelle Penner, RN, BSN, CCM [facility] Care Management Community Coordinator Office number [phone]

## 2022-11-22 NOTE — Patient Instructions (Addendum)
Visit Information  Thank you for taking time to visit with me today. Please don't hesitate to contact me if I can be of assistance to you.   Following are the goals we discussed today:   Goals Addressed             This Visit's Progress    [facility] nurse care coordination service   On track    Interventions Today    Flowsheet Row Most Recent Value  Chronic Disease   Chronic disease during today's visit Hypertension (HTN), Other  [depression, housing insecurity]  General Interventions   General Interventions Discussed/Reviewed General Interventions Discussed  Exercise Interventions   Exercise Discussed/Reviewed Exercise Discussed  Education Interventions   Education Provided --  [discussed what internet cookies are, Stress, sleep apnea, magnesium]  Mental Health Interventions   Mental Health Discussed/Reviewed Mental Health Discussed, Anxiety, Grief and Loss, Depression, Coping Strategies, Other  [active listening, Veronica Redcliff at [employer],, 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 Encouraged to ask for help]  Pharmacy Interventions   Pharmacy Dicussed/Reviewed Pharmacy Topics Discussed             Our next appointment is by telephone on 12/06/22 at 3:15 pm  Please call the care guide team at [phone] if you need to cancel or reschedule your appointment.   If you are experiencing a Mental Health or Behavioral Health Crisis or need someone to talk to, please call the Suicide and Crisis Lifeline: 988 call the [place] National Suicide Prevention Lifeline: 307 280 3389 or TTY: 910-861-5982 TTY 410-693-2922) to talk to a trained counselor call 1-800-273-TALK (toll free, 24 hour hotline) call the [facility]: 918-814-6185 call 911   Patient verbalizes understanding of instructions and care plan provided today and agrees to view in MyChart. Active MyChart status and patient understanding of how to access instructions and care plan via MyChart confirmed with patient.      The patient has been provided with contact information for the care management team and has been advised to call with any health related questions or concerns.   Emett Stapel L. Noelle Penner, RN, BSN, CCM [facility] Care Management Community Coordinator Office number [phone]

## 2022-11-28 ENCOUNTER — Ambulatory Visit: Payer: Self-pay

## 2022-11-28 NOTE — Patient Outreach (Addendum)
  Care Coordination   Follow Up Visit Note   11/28/2022 Name: Tiffany Mendoza MRN: [id number] DOB: 1950/10/30  Tiffany Mendoza is a 72 y.o. year old female who sees [name], [name], MD for primary care. I spoke with  [name] by phone today.  What matters to the patients health and wellness today?  Patient called SW to inform that she has not received a return call from [employer].  Patient has called today but no answer.  SW suggested patient call and ask for another staff member that can assist or the supervisor.  If patient does not receive a call, SW suggests she contact her nephew, who agreed to help with the application, today to complete the online application.    Goals Addressed             This Visit's Progress    Housing       -Patient agreed to allow SW speak to nephew who mediates between patient and sister who owns the home -Patient agreed to consider Public Housing application   11/16/22 -Patient will apply for public housing on her own and follow up with nephew if needed -Patient will continue with the primary plan of waiting to apply for new apartment complex in September  11/28/22 -Patient will contact supervisor at [employer] (staff is not returning her calls)        SDOH assessments and interventions completed:  Yes    Interventions Today    Flowsheet Row Most Recent Value  General Interventions   General Interventions Discussed/Reviewed [employer], General Interventions Reviewed  [Patient spoke with [facility] but staff has not returned her call to complete online application.  Will call supervisor.  Will call nephew if no response from supervisor.]        Care Coordination Interventions:  Yes, provided  Care Coordination Interventions: SW suggests calling [facility] to speak to supervisor or another staff member  SW suggest client contact nephew who agreed to help  Follow up plan: No further  intervention required.   Encounter Outcome:  Pt. Visit Completed

## 2022-11-28 NOTE — Patient Instructions (Signed)
Visit Information  Thank you for taking time to visit with me today. Please don't hesitate to contact me if I can be of assistance to you.   Following are the goals we discussed today:   Goals Addressed             This Visit's Progress    Housing       -Patient request SW speak to nephew who mediates between patient and sister who owns the home -Patient agreed to consider Public Housing application   11/16/22 -Patient will apply for public housing on her own and follow up with nephew if needed -Patient will continue with the primary plan of waiting to apply for new apartment complex in September  11/28/22 -Patient will contact supervisor at [employer] (staff is not returning her calls)         If you are experiencing a Mental Health or Behavioral Health Crisis or need someone to talk to, please call 911  Patient verbalizes understanding of instructions and care plan provided today and agrees to view in [place]. Active MyChart status and patient understanding of how to access instructions and care plan via MyChart confirmed with patient.     No further follow up required:    Lysle Morales, BSW Social Worker [facility] Care Management  [phone]

## 2022-12-06 ENCOUNTER — Ambulatory Visit: Payer: Self-pay | Admitting: *Deleted

## 2022-12-06 NOTE — Patient Outreach (Signed)
  Care Coordination   Follow Up Visit Note   04/06/2023 Update entry for 12/06/22 Name: Tiffany Mendoza MRN: [id number] DOB: Jul 17, 1950  Tiffany Mendoza is a 72 y.o. year old female who sees [name], [name], MD for primary care. I spoke with  [name] by phone today.  What matters to the patients health and wellness today?  chronic fatigue syndrome,   Stomach  pain- acid build up -use of Mylanta, milk of magnesium along with eating at least 3 meals a day   Grief/loss- Continues to go to her [employer] high school events  Depression/anxiety- keep going outside     Goals Addressed             This Visit's Progress    Housing concern, Manage depression & hypertension-nurse care coordination service       Interventions Today    Flowsheet Row Most Recent Value  Chronic Disease   Chronic disease during today's visit Other  [chronic fatigue, abdominal pain grief/loss]  General Interventions   General Interventions Discussed/Reviewed General Interventions Reviewed, Doctor Visits, Sick Day Rules  Doctor Visits Discussed/Reviewed Doctor Visits Reviewed, PCP, Specialist  Education Interventions   Education Provided Provided Education  Provided Verbal Education On Nutrition, Medication  Mental Health Interventions   Mental Health Discussed/Reviewed Mental Health Reviewed, Coping Strategies  Nutrition Interventions   Nutrition Discussed/Reviewed Nutrition Discussed, Portion sizes, Supplemental nutrition             SDOH assessments and interventions completed:  No     Care Coordination Interventions:  Yes, provided   Follow up plan: Follow up call scheduled for pending    Encounter Outcome:  Pt. Visit Completed   Jontrell Bushong L. Noelle Penner, RN, BSN, CCM [facility] Care Management Community Coordinator Office number [phone]

## 2022-12-06 NOTE — Patient Instructions (Signed)
Visit Information  Thank you for taking time to visit with me today. Please don't hesitate to contact me if I can be of assistance to you.   Following are the goals we discussed today:   Goals Addressed             This Visit's Progress    Housing concern, Manage depression & hypertension-nurse care coordination service       Interventions Today    Flowsheet Row Most Recent Value  Chronic Disease   Chronic disease during today's visit Other  [chronic fatigue, abdominal pain grief/loss]  General Interventions   General Interventions Discussed/Reviewed General Interventions Reviewed, Doctor Visits, Sick Day Rules  Doctor Visits Discussed/Reviewed Doctor Visits Reviewed, PCP, Specialist  Education Interventions   Education Provided Provided Education  Provided Verbal Education On Nutrition, Medication  Mental Health Interventions   Mental Health Discussed/Reviewed Mental Health Reviewed, Coping Strategies  Nutrition Interventions   Nutrition Discussed/Reviewed Nutrition Discussed, Portion sizes, Supplemental nutrition             Our next appointment is by telephone on pending at pending  Please call the care guide team at [phone] if you need to cancel or reschedule your appointment.   If you are experiencing a Mental Health or Behavioral Health Crisis or need someone to talk to, please call the Suicide and Crisis Lifeline: 988 call the [place] National Suicide Prevention Lifeline: 519-261-6034 or TTY: 339-485-7664 TTY 916 445 6920) to talk to a trained counselor call 1-800-273-TALK (toll free, 24 hour hotline) call the [facility]: 684 175 1733 call 911   Patient verbalizes understanding of instructions and care plan provided today and agrees to view in MyChart. Active MyChart status and patient understanding of how to access instructions and care plan via MyChart confirmed with patient.     The patient has been provided with contact information  for the care management team and has been advised to call with any health related questions or concerns.   Lenwood Balsam L. Noelle Penner, RN, BSN, CCM [facility] Care Management Community Coordinator Office number [phone]

## 2022-12-15 ENCOUNTER — Ambulatory Visit: Payer: Self-pay

## 2022-12-15 NOTE — Patient Instructions (Signed)
Visit Information  Thank you for taking time to visit with me today. Please don't hesitate to contact me if I can be of assistance to you.   Following are the goals we discussed today:   Goals Addressed             This Visit's Progress    COMPLETED: Housing       -Patient agreed to allow SW speak to nephew who mediates between patient and sister who owns the home -Patient agreed to consider Public Housing application   11/16/22 -Patient will apply for public housing on her own and follow up with nephew if needed -Patient will continue with the primary plan of waiting to apply for new apartment complex in September  11/28/22 -Patient will contact supervisor at [facility] [employer] (staff is not returning her calls)  12/15/22 -Patient has communicated with [facility] and is on the waiting list        If you are experiencing a Mental Health or Behavioral Health Crisis or need someone to talk to, please call 911  Patient verbalizes understanding of instructions and care plan provided today and agrees to view in [place]. Active MyChart status and patient understanding of how to access instructions and care plan via MyChart confirmed with patient.     No further follow up required:    Lysle Morales, BSW Social Worker [facility] Care Management  [phone]

## 2022-12-15 NOTE — Patient Outreach (Signed)
  Care Coordination   Follow Up Visit Note   12/15/2022 Name: Tiffany Mendoza MRN: [id number] DOB: 1950/11/03  Tiffany Mendoza is a 72 y.o. year old female who sees [name], [name], MD for primary care. I spoke with  [name] by phone today.  What matters to the patients health and wellness today?  Patient is on the waiting list for [employer] and plans to continue to monitor other properties she has applied for acceptance.  Patient asks that the word 'request' be removed from her notes as it relates to SW speaking to her nephew and it will be changed to 'agreed to allow'.  Goal of connecting patient to housing options has been achieved.   Goals Addressed             This Visit's Progress    COMPLETED: Housing       -Patient agreed to allow SW speak to nephew who mediates between patient and sister who owns the home -Patient agreed to consider Public Housing application   11/16/22 -Patient will apply for public housing on her own and follow up with nephew if needed -Patient will continue with the primary plan of waiting to apply for new apartment complex in September  11/28/22 -Patient will contact supervisor at [employer] (staff is not returning her calls)  12/15/22 -Patient has communicated with [employer] and is on the waiting list        SDOH assessments and interventions completed:  Yes    Interventions Today    Flowsheet Row Most Recent Value  General Interventions   General Interventions Discussed/Reviewed General Interventions Reviewed  [Patient has completed the initial process with [employer] and is on the waiting list]        Care Coordination Interventions:  Yes, provided  Care Coordination Interventions:  SW made corrections to note    Follow up plan: No further intervention required.   Encounter Outcome:  Pt. Visit Completed

## 2022-12-17 NOTE — Telephone Encounter (Signed)
I am aware that she wanted B12 and magnesium levels checked , has appt 7/, may get lab done 7/5 or 7/8 dx fatigue , and cramps Also am aware that she  is requesting GI symptoms to be documented when she is anxious, I il address at visit and refer her to GI if needed, pld l;et her know and sorry for delay in response but I have noted messages

## 2022-12-20 ENCOUNTER — Other Ambulatory Visit: Payer: Self-pay

## 2022-12-20 DIAGNOSIS — R5383 Other fatigue: Secondary | ICD-10-CM

## 2022-12-20 NOTE — Telephone Encounter (Signed)
Patient aware.

## 2022-12-27 ENCOUNTER — Other Ambulatory Visit: Payer: Self-pay

## 2022-12-27 ENCOUNTER — Ambulatory Visit (INDEPENDENT_AMBULATORY_CARE_PROVIDER_SITE_OTHER): Payer: Medicare Other | Admitting: Family Medicine

## 2022-12-27 ENCOUNTER — Encounter: Payer: Self-pay | Admitting: Family Medicine

## 2022-12-27 VITALS — BP 110/70 | HR 88 | Ht 67.0 in | Wt 163.0 lb

## 2022-12-27 DIAGNOSIS — I10 Essential (primary) hypertension: Secondary | ICD-10-CM | POA: Diagnosis not present

## 2022-12-27 DIAGNOSIS — F418 Other specified anxiety disorders: Secondary | ICD-10-CM | POA: Diagnosis not present

## 2022-12-27 DIAGNOSIS — F5104 Psychophysiologic insomnia: Secondary | ICD-10-CM

## 2022-12-27 DIAGNOSIS — Z1231 Encounter for screening mammogram for malignant neoplasm of breast: Secondary | ICD-10-CM

## 2022-12-27 DIAGNOSIS — Z5986 Financial insecurity: Secondary | ICD-10-CM

## 2022-12-27 DIAGNOSIS — Z59819 Housing instability, housed unspecified: Secondary | ICD-10-CM

## 2022-12-27 LAB — MAGNESIUM: Magnesium: 2.3 mg/dL (ref 1.6–2.3)

## 2022-12-27 LAB — VITAMIN B12: Vitamin B-12: 596 pg/mL (ref 232–1245)

## 2022-12-27 NOTE — Progress Notes (Unsigned)
   Tiffany Mendoza     MRN: [id number]      DOB: 1951-02-13  Chief Complaint  Patient presents with   Hypertension    Follow up    HPI Tiffany Mendoza is here for follow up and re-evaluation of chronic medical conditions, medication management and review of any available recent lab and radiology data.  Preventive health is updated, specifically  Cancer screening and Immunization.   Reports that she is needed to getting housing on her own and still complains of significant negative relationships with her sisters who she tries to distance herself from. Has had excellent support from Hospice therapist  ROS Denies recent fever or chills. Denies sinus pressure, nasal congestion, ear pain or sore throat. Denies chest congestion, productive cough or wheezing. Denies chest pains, palpitations and leg swelling Denies abdominal pain, nausea, vomiting,diarrhea or constipation.   Denies dysuria, frequency, hesitancy or incontinence. Denies joint pain, swelling and limitation in mobility. Denies headaches, seizures, numbness, or tingling. Denies uncontrolled depression, anxiety or insomnia. Denies skin break down or rash.   PE  BP 110/70   Pulse 88   Ht 5\' 7"  (1.702 m)   Wt 163 lb (73.9 kg)   SpO2 96%   BMI 25.53 kg/m   Patient alert and oriented and in no cardiopulmonary distress.  HEENT: No facial asymmetry, EOMI,     Neck supple .  Chest: Clear to auscultation bilaterally.  CVS: S1, S2 no murmurs, no S3.Regular rate.  ABD: Soft non tender.   Ext: No edema  MS: Adequate ROM spine, shoulders, hips and knees.  Skin: Intact, no ulcerations or rash noted.  Psych: Good eye contact, normal affect. Memory intact not anxious or depressed appearing.  CNS: CN 2-12 intact, power,  normal throughout.no focal deficits noted.   Assessment & Plan  Essential hypertension Controlled, no change in medication DASH diet and commitment to daily physical activity for a minimum of 30 minutes  discussed and encouraged, as a part of hypertension management. The importance of attaining a healthy weight is also discussed.     12/27/2022    3:40 PM 12/27/2022    3:14 PM 12/27/2022    3:12 PM 10/25/2022    2:59 PM 09/27/2022    2:58 PM 09/26/2022    2:48 PM 05/26/2022    3:50 PM  BP/Weight  Systolic BP 110 121 144 137 135 128 160  Diastolic BP 70 75 81 85 80 70 91  Wt. (Lbs)   163 160 166.04 165 164  BMI   25.53 kg/m2 25.06 kg/m2 26.01 kg/m2 26.63 kg/m2 25.69 kg/m2       Depression with anxiety Depression still not adequately treated, anticipate improvement with acquisition of housing, if not better at next visit will inc zoloft dose  Financial insecurity Nephew providing financial support  Insomnia Controlled, no change in medication   Housing insecurity Anticia[te affordable housing in next 2 to 4 months

## 2022-12-27 NOTE — Patient Instructions (Addendum)
F/U as before, call if you need me be sooner, flu vaccine at visit  Need to schedule annual wellness visit  at checkout please, due July 25 or after  Blood pressure and labs are good  Please schedule your mammogram  Please get Covid vaccine in the Fall when available  Thanks for choosing [facility], we consider it a privelige to serve you.

## 2022-12-28 ENCOUNTER — Encounter: Payer: Self-pay | Admitting: Family Medicine

## 2022-12-28 NOTE — Assessment & Plan Note (Signed)
Controlled, no change in medication DASH diet and commitment to daily physical activity for a minimum of 30 minutes discussed and encouraged, as a part of hypertension management. The importance of attaining a healthy weight is also discussed.     12/27/2022    3:40 PM 12/27/2022    3:14 PM 12/27/2022    3:12 PM 10/25/2022    2:59 PM 09/27/2022    2:58 PM 09/26/2022    2:48 PM 05/26/2022    3:50 PM  BP/Weight  Systolic BP 110 121 144 137 135 128 160  Diastolic BP 70 75 81 85 80 70 91  Wt. (Lbs)   163 160 166.04 165 164  BMI   25.53 kg/m2 25.06 kg/m2 26.01 kg/m2 26.63 kg/m2 25.69 kg/m2

## 2022-12-28 NOTE — Assessment & Plan Note (Signed)
Controlled, no change in medication  

## 2022-12-28 NOTE — Assessment & Plan Note (Signed)
Nephew providing financial support

## 2022-12-28 NOTE — Assessment & Plan Note (Signed)
Anticia[te affordable housing in next 2 to 4 months

## 2022-12-28 NOTE — Assessment & Plan Note (Signed)
Depression still not adequately treated, anticipate improvement with acquisition of housing, if not better at next visit will inc zoloft dose

## 2023-01-16 ENCOUNTER — Ambulatory Visit (INDEPENDENT_AMBULATORY_CARE_PROVIDER_SITE_OTHER): Payer: Medicare Other

## 2023-01-16 VITALS — Ht 67.0 in | Wt 163.0 lb

## 2023-01-16 DIAGNOSIS — Z0000 Encounter for general adult medical examination without abnormal findings: Secondary | ICD-10-CM

## 2023-01-16 NOTE — Progress Notes (Signed)
Because this visit was a virtual/telehealth visit,  certain criteria was not obtained, such a blood pressure, CBG if patient is a diabetic, and timed up and go. Any medications not marked as "taking" was not mentioned during the medication reconciliation part of the visit. Any vitals not documented were not able to be obtained due to this being a telehealth visit. Vitals documented are verbally provided by the patient.  Per patient no change in vitals since last visit, unable to obtain new vitals due to telehealth visit  Subjective:   Tiffany Mendoza is a 72 y.o. female who presents for Medicare Annual (Subsequent) preventive examination.  Visit Complete: Virtual  I connected with  Tiffany Mendoza on 01/16/23 by a audio enabled telemedicine application and verified that I am speaking with the correct person using two identifiers.  Patient Location: Home  Provider Location: Home Office  I discussed the limitations of evaluation and management by telemedicine. The patient expressed understanding and agreed to proceed.  Patient Medicare AWV questionnaire was completed by the patient on n/a ; I have confirmed that all information answered by patient is correct and no changes since this date.  Review of Systems     Cardiac Risk Factors include: advanced age (>68men, >38 women);dyslipidemia;hypertension;sedentary lifestyle     Objective:    Today's Vitals   01/16/23 1354  Weight: 163 lb (73.9 kg)  Height: 5\' 7"  (1.702 m)   Body mass index is 25.53 kg/m.     01/16/2023    1:53 PM 05/26/2022    3:49 PM 01/11/2022    3:35 PM 09/12/2021    4:17 PM 05/20/2021    3:14 PM 12/16/2020   11:50 AM 05/27/2019   10:46 AM  Advanced Directives  Does Patient Have a Medical Advance Directive? No No Yes No No No Yes  Type of Surveyor, minerals;Living will      Would patient like information on creating a medical advance directive? No - Patient declined No - Patient  declined  No - Patient declined No - Patient declined No - Patient declined     Current Medications (verified) Outpatient Encounter Medications as of 01/16/2023  Medication Sig   Apoaequorin (PREVAGEN EXTRA STRENGTH PO) Take 1 each by mouth daily.   Cholecalciferol (VITAMIN D3 PO) Take 5,000 Units by mouth daily.   clorazepate (TRANXENE) 7.5 MG tablet Take 1 tablet (7.5 mg total) by mouth 2 (two) times daily as needed. for anxiety   hydrochlorothiazide (HYDRODIURIL) 25 MG tablet Take 1 tablet (25 mg total) by mouth daily.   Omega-3 Fatty Acids (FISH OIL) 1000 MG CAPS Take by mouth.   Polyethylene Glycol 3350 (MIRALAX PO) Take by mouth daily as needed.   Potassium 99 MG TABS Take by mouth daily.   PSYLLIUM HUSK PO Take by mouth. 1 capsule daily   sertraline (ZOLOFT) 50 MG tablet Take one and a half tablets by mouth once daily   TURMERIC PO Take 1 Dose by mouth daily as needed (supplement).   No facility-administered encounter medications on file as of 01/16/2023.    Allergies (verified) Patient has no known allergies.   History: Past Medical History:  Diagnosis Date   Adjustment disorder with mixed anxiety and depressed mood    Allergy    Carotid bruit    Depression    Dermatophytosis of foot    GERD (gastroesophageal reflux disease)    Hypertension    Insomnia    Neck pain  Osteoporosis    Past Surgical History:  Procedure Laterality Date   ABDOMINAL HYSTERECTOMY  1988   HEMORRHOID SURGERY     myoectomy  1984   Family History  Problem Relation Age of Onset   Memory loss Mother    Colon polyps Father    Prostate cancer Father    ALS Father    Thyroid disease Sister    Thyroid nodules Sister        Thyroidectomy in 1984   Fibroids Sister    Hypertension Sister    Pancreatic cancer Maternal Grandfather    Liver cancer Paternal Grandmother    Stomach cancer Neg Hx    Colon cancer Neg Hx    Esophageal cancer Neg Hx    Social History   Socioeconomic History    Marital status: Divorced    Spouse name: Not on file   Number of children: 0   Years of education: college   Highest education level: Bachelor's degree (e.g., BA, AB, BS)  Occupational History   Occupation: caregiver  Tobacco Use   Smoking status: Former    Types: Cigarettes   Smokeless tobacco: Never  Vaping Use   Vaping status: Never Used  Substance and Sexual Activity   Alcohol use: Yes    Comment: occasional wine   Drug use: No   Sexual activity: Not Currently  Other Topics Concern   Not on file  Social History Narrative   Lives alone, divorced   Was the caregiver for her Mother prior to her passing in June 2023 who had dementia    2024 continues to live in her mother's home   Social Determinants of Health   Financial Resource Strain: Low Risk  (01/16/2023)   Overall Financial Resource Strain (CARDIA)    Difficulty of Paying Living Expenses: Not very hard  Food Insecurity: No Food Insecurity (01/16/2023)   Hunger Vital Sign    Worried About Running Out of Food in the Last Year: Never true    Ran Out of Food in the Last Year: Never true  Transportation Needs: No Transportation Needs (01/16/2023)   PRAPARE - Administrator, Civil Service (Medical): No    Lack of Transportation (Non-Medical): No  Physical Activity: Inactive (01/16/2023)   Exercise Vital Sign    Days of Exercise per Week: 0 days    Minutes of Exercise per Session: 0 min  Stress: Stress Concern Present (01/16/2023)   [employer] of Occupational Health - Occupational Stress Questionnaire    Feeling of Stress : Rather much  Social Connections: Moderately Isolated (01/16/2023)   Social Connection and Isolation Panel [NHANES]    Frequency of Communication with Friends and Family: More than three times a week    Frequency of Social Gatherings with Friends and Family: More than three times a week    Attends Religious Services: More than 4 times per year    Active Member of [employer] or Organizations:  No    Attends Engineer, structural: Never    Marital Status: Divorced    Tobacco Counseling Counseling given: Yes   Clinical Intake:  Pre-visit preparation completed: Yes  Pain : No/denies pain     BMI - recorded: 25.53 Nutritional Status: BMI 25 -29 Overweight Nutritional Risks: None Diabetes: No  How often do you need to have someone help you when you read instructions, pamphlets, or other written materials from your doctor or pharmacy?: 1 - Never  Interpreter Needed?: No  Information entered by ::  Abby Kanyah Matsushima, CMA   Activities of Daily Living    01/16/2023    2:22 PM  In your present state of health, do you have any difficulty performing the following activities:  Hearing? 0  Vision? 0  Difficulty concentrating or making decisions? 0  Walking or climbing stairs? 0  Dressing or bathing? 0  Doing errands, shopping? 0  Preparing Food and eating ? N  Using the Toilet? N  In the past six months, have you accidently leaked urine? N  Do you have problems with loss of bowel control? N  Managing your Medications? N  Managing your Finances? N  Housekeeping or managing your Housekeeping? N    Patient Care Team: Kerri Perches, MD as PCP - General Christell Constant, MD as PCP - Cardiology (Cardiology) Freddy Finner, MD (Inactive) as Consulting Physician (Obstetrics and Gynecology) Jethro Bolus, MD as Consulting Physician (Ophthalmology) Clinton Gallant, RN as [facility] Care Management  Indicate any recent Medical Services you may have received from other than [facility] providers in the past year (date may be approximate).     Assessment:   This is a routine wellness examination for [place].  Hearing/Vision screen Hearing Screening - Comments:: Patient denies any hearing difficulties.    Dietary issues and exercise activities discussed:     Goals Addressed               This Visit's Progress     Patient Stated  (pt-stated)        To get moved and settled into a new apartment       Depression Screen    01/16/2023    1:58 PM 12/27/2022    3:14 PM 11/22/2022    3:48 PM 10/25/2022    3:00 PM 09/27/2022    3:03 PM 04/26/2022    3:05 PM 02/10/2022    3:16 PM  PHQ 2/9 Scores  PHQ - 2 Score 3 2 2 2 2 6 2   PHQ- 9 Score 6 8  4 2 8 6     Fall Risk    01/16/2023    2:22 PM 12/27/2022    3:14 PM 10/25/2022    3:00 PM 09/27/2022    3:03 PM 04/26/2022    3:04 PM  Fall Risk   Falls in the past year? 0 0 0 0 0  Number falls in past yr: 0 0 0 0 0  Injury with Fall? 0 0 0 0 0  Risk for fall due to : No Fall Risks No Fall Risks No Fall Risks No Fall Risks   Follow up Falls prevention discussed Falls evaluation completed Falls evaluation completed Falls evaluation completed Falls evaluation completed    MEDICARE RISK AT HOME:  Medicare Risk at Home - 01/16/23 1421     Any stairs in or around the home? No    If so, are there any without handrails? No    Home free of loose throw rugs in walkways, pet beds, electrical cords, etc? Yes    Adequate lighting in your home to reduce risk of falls? Yes    Life alert? No    Use of a cane, walker or w/c? No    Grab bars in the bathroom? No    Shower chair or bench in shower? No    Elevated toilet seat or a handicapped toilet? No             TIMED UP AND GO:  Was the test performed?  No    Cognitive Function:        01/16/2023    1:57 PM 01/11/2022    3:36 PM 12/16/2020   11:52 AM 05/27/2019   10:51 AM 05/23/2018    1:54 PM  6CIT Screen  What Year? 0 points 0 points 0 points 0 points 0 points  What month? 0 points 0 points 0 points 0 points 0 points  What time? 0 points 0 points 0 points 0 points 0 points  Count back from 20 0 points 0 points 0 points 0 points 0 points  Months in reverse 0 points 0 points 0 points 0 points 0 points  Repeat phrase 0 points 0 points 0 points 0 points 0 points  Total Score 0 points 0 points 0 points 0 points 0 points     Immunizations Immunization History  Administered Date(s) Administered   Fluad Quad(high Dose 65+) 04/02/2019, 04/15/2020, 05/20/2021, 03/28/2022   Influenza Split 04/18/2011, 08/09/2012   Influenza Whole 04/29/2008, 04/15/2010   Influenza, High Dose Seasonal PF 04/26/2018   Influenza,inj,Quad PF,6+ Mos 03/18/2014, 04/28/2015, 05/18/2016, 05/24/2017   Moderna Covid-19 Vaccine Bivalent Booster 68yrs & up 04/05/2022, 09/30/2022   Moderna SARS-COV2 Booster Vaccination 11/13/2020   Moderna Sars-Covid-2 Vaccination 08/24/2019, 09/25/2019, 04/13/2020, 11/13/2020, 03/23/2021   PPD Test 08/09/2012, 05/09/2022   Pneumococcal Conjugate-13 08/14/2015   Pneumococcal Polysaccharide-23 09/17/2013   Rsv, Bivalent, Protein Subunit Rsvpref,pf Verdis Frederickson) 03/28/2022   Td 08/14/2007   Tdap 01/01/2019   Zoster Recombinant(Shingrix) 07/14/2021, 10/05/2021   Zoster, Live 10/01/2014    TDAP status: Up to date  Flu Vaccine status: Up to date  Pneumococcal vaccine status: Up to date  Covid-19 vaccine status: Information provided on how to obtain vaccines.   Qualifies for Shingles Vaccine? Yes   Zostavax completed Yes   Shingrix Completed?: Yes  Screening Tests Health Maintenance  Topic Date Due   COVID-19 Vaccine (8 - 2023-24 season) 11/25/2022   INFLUENZA VACCINE  01/19/2023   Medicare Annual Wellness (AWV)  01/16/2024   MAMMOGRAM  02/18/2024   Colonoscopy  06/16/2027   DTaP/Tdap/Td (3 - Td or Tdap) 12/31/2028   Pneumonia Vaccine 71+ Years old  Completed   DEXA SCAN  Completed   Hepatitis C Screening  Completed   Zoster Vaccines- Shingrix  Completed   HPV VACCINES  Aged Out    Health Maintenance  Health Maintenance Due  Topic Date Due   COVID-19 Vaccine (8 - 2023-24 season) 11/25/2022    Colorectal cancer screening: Type of screening: Colonoscopy. Completed 06/15/2017. Repeat every 10 years  Mammogram status: Ordered Patient has an appt for September. Pt provided with contact  info and advised to call to schedule appt.   Bone Density status: Completed 06/19/2019. Results reflect: Bone density results: NORMAL. Repeat every 5 years.  Lung Cancer Screening: (Low Dose CT Chest recommended if Age 46-80 years, 20 pack-year currently smoking OR have quit w/in 15years.) does not qualify.   Additional Screening:  Hepatitis C Screening: does not qualify; Completed 04/28/2015  Vision Screening: Recommended annual ophthalmology exams for early detection of glaucoma and other disorders of the eye. Is the patient up to date with their annual eye exam?  Yes  Who is the provider or what is the name of the office in which the patient attends annual eye exams? Dr. Nile Riggs, she was given referrals for a new eye care provider If pt is not established with a provider, would they like to be referred to a provider to establish care? No .  Dental Screening: Recommended annual dental exams for proper oral hygiene  Diabetic Foot Exam: n/a  Community Resource Referral / Chronic Care Management: CRR required this visit?  No   CCM required this visit?  No     Plan:     I have personally reviewed and noted the following in the patient's chart:   Medical and social history Use of alcohol, tobacco or illicit drugs  Current medications and supplements including opioid prescriptions. Patient is not currently taking opioid prescriptions. Functional ability and status Nutritional status Physical activity Advanced directives List of other physicians Hospitalizations, surgeries, and ER visits in previous 12 months Vitals Screenings to include cognitive, depression, and falls Referrals and appointments  In addition, I have reviewed and discussed with patient certain preventive protocols, quality metrics, and best practice recommendations. A written personalized care plan for preventive services as well as general preventive health recommendations were provided to patient.      Jordan Hawks Kylena Mole, CMA   01/16/2023   After Visit Summary: (MyChart) Due to this being a telephonic visit, the after visit summary with patients personalized plan was offered to patient via MyChart   Nurse Notes: Patient has her mammogram scheduled for September

## 2023-01-16 NOTE — Patient Instructions (Signed)
[name] , Thank you for taking time to come for your Medicare Wellness Visit. I appreciate your ongoing commitment to your health goals. Please review the following plan we discussed and let me know if I can assist you in the future.   These are the goals we discussed:  Goals       Find place of her own (pt-stated)      She would like to get out and find a place of her own.      Patient Stated (pt-stated)      To get moved and settled into a new apartment      [facility] nurse care coordination service      Interventions Today    Flowsheet Row Most Recent Value  Chronic Disease   Chronic disease during today's visit Hypertension (HTN), Other  [depression, housing insecurity]  General Interventions   General Interventions Discussed/Reviewed General Interventions Discussed  Exercise Interventions   Exercise Discussed/Reviewed Exercise Discussed  Education Interventions   Education Provided --  [discussed what internet cookies are, Stress, sleep apnea, magnesium]  Mental Health Interventions   Mental Health Discussed/Reviewed Mental Health Discussed, Anxiety, Grief and Loss, Depression, Coping Strategies, Other  [active listening, Veronica Redcliff at [employer],, 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 Encouraged to ask for help]  Pharmacy Interventions   Pharmacy Dicussed/Reviewed Pharmacy Topics Discussed             This is a list of the screening recommended for you and due dates:  Health Maintenance  Topic Date Due   COVID-19 Vaccine (8 - 2023-24 season) 11/25/2022   Flu Shot  01/19/2023   Medicare Annual Wellness Visit  01/16/2024   Mammogram  02/18/2024   Colon Cancer Screening  06/16/2027   DTaP/Tdap/Td vaccine (3 - Td or Tdap) 12/31/2028   Pneumonia Vaccine  Completed   DEXA scan (bone density measurement)  Completed   Hepatitis C Screening  Completed   Zoster (Shingles) Vaccine  Completed   HPV Vaccine  Aged Out    Advanced directives: Advance directive discussed with you  today. Even though you declined this today, please call our office should you change your mind, and we can give you the proper paperwork for you to fill out. Advance care planning is a way to make decisions about medical care that fits your values in case you are ever unable to make these decisions for yourself.  Information on Advanced Care Planning can be found at [facility] [place] Advance Health Care Directives Advance Health Care Directives ([email or web address]    Conditions/risks identified: Keep your mammogram appointment. Have them fax the report to Dr. [name]  Next appointment: VIRTUAL/TELEPHONE APPOINTMENT Follow up in one year for your annual wellness visit  February 28, 2024 at [facility] telephone visit.    Preventive Care 65 Years and Older, Female Preventive care refers to lifestyle choices and visits with your health care provider that can promote health and wellness. What does preventive care include? A yearly physical exam. This is also called an annual well check. Dental exams once or twice a year. Routine eye exams. Ask your health care provider how often you should have your eyes checked. Personal lifestyle choices, including: Daily care of your teeth and gums. Regular physical activity. Eating a healthy diet. Avoiding tobacco and drug use. Limiting alcohol use. Practicing safe sex. Taking low-dose aspirin every day. Taking vitamin and mineral supplements as recommended by your health care provider. What happens during an annual well check? The  services and screenings done by your health care provider during your annual well check will depend on your age, overall health, lifestyle risk factors, and family history of disease. Counseling  Your health care provider may ask you questions about your: Alcohol use. Tobacco use. Drug use. Emotional well-being. Home and relationship well-being. Sexual activity. Eating habits. History of falls. Memory and ability to  understand (cognition). Work and work Astronomer. Reproductive health. Screening  You may have the following tests or measurements: Height, weight, and BMI. Blood pressure. Lipid and cholesterol levels. These may be checked every 5 years, or more frequently if you are over 63 years old. Skin check. Lung cancer screening. You may have this screening every year starting at age 20 if you have a 30-pack-year history of smoking and currently smoke or have quit within the past 15 years. Fecal occult blood test (FOBT) of the stool. You may have this test every year starting at age 2. Flexible sigmoidoscopy or colonoscopy. You may have a sigmoidoscopy every 5 years or a colonoscopy every 10 years starting at age 37. Hepatitis C blood test. Hepatitis B blood test. Sexually transmitted disease (STD) testing. Diabetes screening. This is done by checking your blood sugar (glucose) after you have not eaten for a while (fasting). You may have this done every 1-3 years. Bone density scan. This is done to screen for osteoporosis. You may have this done starting at age 7. Mammogram. This may be done every 1-2 years. Talk to your health care provider about how often you should have regular mammograms. Talk with your health care provider about your test results, treatment options, and if necessary, the need for more tests. Vaccines  Your health care provider may recommend certain vaccines, such as: Influenza vaccine. This is recommended every year. Tetanus, diphtheria, and acellular pertussis (Tdap, Td) vaccine. You may need a Td booster every 10 years. Zoster vaccine. You may need this after age 20. Pneumococcal 13-valent conjugate (PCV13) vaccine. One dose is recommended after age 30. Pneumococcal polysaccharide (PPSV23) vaccine. One dose is recommended after age 70. Talk to your health care provider about which screenings and vaccines you need and how often you need them. This information is not  intended to replace advice given to you by your health care provider. Make sure you discuss any questions you have with your health care provider. Document Released: 07/03/2015 Document Revised: 02/24/2016 Document Reviewed: 04/07/2015 Elsevier Interactive Patient Education  2017 [employer].  Fall Prevention in the Home Falls can cause injuries. They can happen to people of all ages. There are many things you can do to make your home safe and to help prevent falls. What can I do on the outside of my home? Regularly fix the edges of walkways and driveways and fix any cracks. Remove anything that might make you trip as you walk through a door, such as a raised step or threshold. Trim any bushes or trees on the path to your home. Use bright outdoor lighting. Clear any walking paths of anything that might make someone trip, such as rocks or tools. Regularly check to see if handrails are loose or broken. Make sure that both sides of any steps have handrails. Any raised decks and porches should have guardrails on the edges. Have any leaves, snow, or ice cleared regularly. Use sand or salt on walking paths during winter. Clean up any spills in your garage right away. This includes oil or grease spills. What can I do in the bathroom?  Use night lights. Install grab bars by the toilet and in the tub and shower. Do not use towel bars as grab bars. Use non-skid mats or decals in the tub or shower. If you need to sit down in the shower, use a plastic, non-slip stool. Keep the floor dry. Clean up any water that spills on the floor as soon as it happens. Remove soap buildup in the tub or shower regularly. Attach bath mats securely with double-sided non-slip rug tape. Do not have throw rugs and other things on the floor that can make you trip. What can I do in the bedroom? Use night lights. Make sure that you have a light by your bed that is easy to reach. Do not use any sheets or blankets that are  too big for your bed. They should not hang down onto the floor. Have a firm chair that has side arms. You can use this for support while you get dressed. Do not have throw rugs and other things on the floor that can make you trip. What can I do in the kitchen? Clean up any spills right away. Avoid walking on wet floors. Keep items that you use a lot in easy-to-reach places. If you need to reach something above you, use a strong step stool that has a grab bar. Keep electrical cords out of the way. Do not use floor polish or wax that makes floors slippery. If you must use wax, use non-skid floor wax. Do not have throw rugs and other things on the floor that can make you trip. What can I do with my stairs? Do not leave any items on the stairs. Make sure that there are handrails on both sides of the stairs and use them. Fix handrails that are broken or loose. Make sure that handrails are as long as the stairways. Check any carpeting to make sure that it is firmly attached to the stairs. Fix any carpet that is loose or worn. Avoid having throw rugs at the top or bottom of the stairs. If you do have throw rugs, attach them to the floor with carpet tape. Make sure that you have a light switch at the top of the stairs and the bottom of the stairs. If you do not have them, ask someone to add them for you. What else can I do to help prevent falls? Wear shoes that: Do not have high heels. Have rubber bottoms. Are comfortable and fit you well. Are closed at the toe. Do not wear sandals. If you use a stepladder: Make sure that it is fully opened. Do not climb a closed stepladder. Make sure that both sides of the stepladder are locked into place. Ask someone to hold it for you, if possible. Clearly mark and make sure that you can see: Any grab bars or handrails. First and last steps. Where the edge of each step is. Use tools that help you move around (mobility aids) if they are needed. These  include: Canes. Walkers. Scooters. Crutches. Turn on the lights when you go into a dark area. Replace any light bulbs as soon as they burn out. Set up your furniture so you have a clear path. Avoid moving your furniture around. If any of your floors are uneven, fix them. If there are any pets around you, be aware of where they are. Review your medicines with your doctor. Some medicines can make you feel dizzy. This can increase your chance of falling. Ask your doctor what other  things that you can do to help prevent falls. This information is not intended to replace advice given to you by your health care provider. Make sure you discuss any questions you have with your health care provider. Document Released: 04/02/2009 Document Revised: 11/12/2015 Document Reviewed: 07/11/2014 Elsevier Interactive Patient Education  2017 [employer].

## 2023-02-02 ENCOUNTER — Telehealth: Payer: Self-pay | Admitting: Gastroenterology

## 2023-02-02 NOTE — Telephone Encounter (Signed)
Inbound call from patient requesting to schedule with provider but provider has no available apts. Patient does not want to schedule with PA. Please advise.

## 2023-02-02 NOTE — Telephone Encounter (Signed)
Patient has several questions. She had questions about recall colonoscopy and type of polyp taken. Discussed the results and the recommendation for a 10 year recall. We also discussed her grief recovery. Very delightful lady. She will see her PCP in September. She will address any concerns she has at that time. If the PCP feels she needs to see GI, the patient will let us know.

## 2023-02-17 ENCOUNTER — Encounter: Payer: Self-pay | Admitting: Internal Medicine

## 2023-02-17 ENCOUNTER — Ambulatory Visit: Payer: Medicare Other | Attending: Internal Medicine | Admitting: Internal Medicine

## 2023-02-17 VITALS — BP 122/70 | HR 81 | Ht 67.0 in | Wt 157.0 lb

## 2023-02-17 DIAGNOSIS — E782 Mixed hyperlipidemia: Secondary | ICD-10-CM | POA: Insufficient documentation

## 2023-02-17 DIAGNOSIS — I2583 Coronary atherosclerosis due to lipid rich plaque: Secondary | ICD-10-CM | POA: Diagnosis not present

## 2023-02-17 DIAGNOSIS — I10 Essential (primary) hypertension: Secondary | ICD-10-CM | POA: Insufficient documentation

## 2023-02-17 DIAGNOSIS — I2510 Atherosclerotic heart disease of native coronary artery without angina pectoris: Secondary | ICD-10-CM | POA: Insufficient documentation

## 2023-02-17 MED ORDER — ROSUVASTATIN CALCIUM 5 MG PO TABS: 5.0000 mg | ORAL_TABLET | Freq: Every day | ORAL | 3 refills | Status: DC

## 2023-02-17 NOTE — Patient Instructions (Signed)
Medication Instructions:  Your physician has recommended you make the following change in your medication:  START: rosuvastatin (Crestor) 5 mg by mouth once daily at dinner time.   *If you need a refill on your cardiac medications before your next appointment, please call your pharmacy*   Lab Work: IN 3 MONTHS: fasting lipid panel and ALT (nothing to eat or drink except water 8 hours prior)  If you have labs (blood work) drawn today and your tests are completely normal, you will receive your results only by: MyChart Message (if you have MyChart) OR A paper copy in the mail If you have any lab test that is abnormal or we need to change your treatment, we will call you to review the results.   Testing/Procedures: NONE   Follow-Up: At [facility], you and your health needs are our priority.  As part of our continuing mission to provide you with exceptional heart care, we have created designated Provider Care Teams.  These Care Teams include your primary Cardiologist (physician) and Advanced Practice Providers (APPs -  Physician Assistants and Nurse Practitioners) who all work together to provide you with the care you need, when you need it.   Your next appointment:   1 year(s)  Provider:   Christell Constant, MD

## 2023-02-17 NOTE — Progress Notes (Signed)
Cardiology Office Note:    Date:  02/17/2023   ID:  MKAYLA Mendoza, DOB 1951/03/14, MRN [id number]  PCP:  Kerri Perches, MD   [facility] Providers Cardiologist:  Christell Constant, MD     Referring MD: Kerri Perches, MD   CC: CAD f/u  History of Present Illness:    Tiffany Mendoza is a 72 y.o. female with a hx of HTN, who presents for evaluation 09/29/21. 2023: In March she was going through a lot of stress: her mother has dementia and needs additional care. Found to have mild CAD.  Mother passed 11/2022. 2024: Moving out of her mother's house.    Patient notes that he is doing ok.   Since last visit notes that she finished with her counselor from the loss of her mother. . There are no interval hospital/ED visit.   EKG showed SR She is working through her griel.  No chest pain or pressure .  No SOB/DOE and no PND/Orthopnea.  No weight gain or leg swelling.  No palpitations or syncope .   Past Medical History:  Diagnosis Date   Adjustment disorder with mixed anxiety and depressed mood    Allergy    Carotid bruit    Depression    Dermatophytosis of foot    GERD (gastroesophageal reflux disease)    Hypertension    Insomnia    Neck pain    Osteoporosis     Past Surgical History:  Procedure Laterality Date   ABDOMINAL HYSTERECTOMY  1988   HEMORRHOID SURGERY     myoectomy  1984    Current Medications: Current Meds  Medication Sig   Apoaequorin (PREVAGEN EXTRA STRENGTH PO) Take 1 each by mouth daily.   Cholecalciferol (VITAMIN D3 PO) Take 5,000 Units by mouth daily.   clorazepate (TRANXENE) 7.5 MG tablet Take 1 tablet (7.5 mg total) by mouth 2 (two) times daily as needed. for anxiety   hydrochlorothiazide (HYDRODIURIL) 25 MG tablet Take 1 tablet (25 mg total) by mouth daily.   Omega-3 Fatty Acids (FISH OIL) 1000 MG CAPS Take by mouth.   Polyethylene Glycol 3350 (MIRALAX PO) Take by mouth daily as needed.   Potassium 99 MG TABS Take by mouth  daily.   PSYLLIUM HUSK PO Take by mouth. 1 capsule daily   rosuvastatin (CRESTOR) 5 MG tablet Take 1 tablet (5 mg total) by mouth daily.   sertraline (ZOLOFT) 50 MG tablet Take one and a half tablets by mouth once daily   TURMERIC PO Take 1 Dose by mouth daily as needed (supplement).     Allergies:   Patient has no known allergies.   Social History   Socioeconomic History   Marital status: Divorced    Spouse name: Not on file   Number of children: 0   Years of education: college   Highest education level: Bachelor's degree (e.g., BA, AB, BS)  Occupational History   Occupation: caregiver  Tobacco Use   Smoking status: Former    Types: Cigarettes   Smokeless tobacco: Never  Vaping Use   Vaping status: Never Used  Substance and Sexual Activity   Alcohol use: Yes    Comment: occasional wine   Drug use: No   Sexual activity: Not Currently  Other Topics Concern   Not on file  Social History Narrative   Lives alone, divorced   Was the caregiver for her Mother prior to her passing in June 2023 who had dementia    2024  continues to live in her mother's home   Social Determinants of Health   Financial Resource Strain: Low Risk  (01/16/2023)   Overall Financial Resource Strain (CARDIA)    Difficulty of Paying Living Expenses: Not very hard  Food Insecurity: No Food Insecurity (01/16/2023)   Hunger Vital Sign    Worried About Running Out of Food in the Last Year: Never true    Ran Out of Food in the Last Year: Never true  Transportation Needs: No Transportation Needs (01/16/2023)   PRAPARE - Administrator, Civil Service (Medical): No    Lack of Transportation (Non-Medical): No  Physical Activity: Inactive (01/16/2023)   Exercise Vital Sign    Days of Exercise per Week: 0 days    Minutes of Exercise per Session: 0 min  Stress: Stress Concern Present (01/16/2023)   [employer] of Occupational Health - Occupational Stress Questionnaire    Feeling of Stress :  Rather much  Social Connections: Moderately Isolated (01/16/2023)   Social Connection and Isolation Panel [NHANES]    Frequency of Communication with Friends and Family: More than three times a week    Frequency of Social Gatherings with Friends and Family: More than three times a week    Attends Religious Services: More than 4 times per year    Active Member of [employer] or Organizations: No    Attends Banker Meetings: Never    Marital Status: Divorced    Social: from [place], mother passed in 2023 from dementia  Family History: The patient's family history includes ALS in her father; Colon polyps in her father; Fibroids in her sister; Hypertension in her sister; Liver cancer in her paternal grandmother; Memory loss in her mother; Pancreatic cancer in her maternal grandfather; Prostate cancer in her father; Thyroid disease in her sister; Thyroid nodules in her sister. There is no history of Stomach cancer, Colon cancer, or Esophageal cancer. No heart disease in family  ROS:   Please see the history of present illness.     EKGs/Labs/Other Studies Reviewed:    The following studies were reviewed today:  EKG:   09/29/21: SR rate 70 septal infarct pattern no ST changes  Cardiac Studies & Procedures          CT SCANS  CT CORONARY MORPH W/CTA COR W/SCORE 10/11/2021  Addendum 10/11/2021  3:39 PM ADDENDUM REPORT: 10/11/2021 15:37  CLINICAL DATA:  33F with chest pain  EXAM: Cardiac/Coronary CTA  TECHNIQUE: The patient was scanned on a [employer].  FINDINGS: A 100 kV prospective scan was triggered in the descending thoracic aorta at 111 HU's. Axial non-contrast 3 mm slices were carried out through the heart. The data set was analyzed on a dedicated work station and scored using the Agatson method. Gantry rotation speed was 250 msecs and collimation was .6 mm. 0.8 mg of sl NTG was given. The 3D data set was reconstructed in 5% intervals of the 35-75 %  of the R-R cycle. Phases were analyzed on a dedicated work station using MPR, MIP and VRT modes. The patient received 80 cc of contrast.  Coronary Arteries:  Normal coronary origin.  Right dominance.  RCA is a large dominant artery that gives rise to PDA and PLA. There is no plaque.  Left main is a large artery that gives rise to LAD and LCX arteries.  LAD is a large vessel. Calcified plaque in proximal LAD causes 0-24% stenosis. Calcified plaque in mid LAD causes 0-24% stenosis  LCX is a non-dominant artery.  There is no plaque.  Other findings:  Left Ventricle: Normal size  Left Atrium: Mild enlargement  Pulmonary Veins: Normal configuration  Right Ventricle: Normal size  Right Atrium: Normal size  Cardiac valves: No calcifications  Thoracic aorta: Normal size  Pulmonary Arteries: Normal size  Systemic Veins: Normal drainage  Pericardium: Normal thickness  IMPRESSION: 1. Coronary calcium score of 79. This was 70th percentile for age and sex matched control.  2.  Normal coronary origin with right dominance.  3. Nonobstructive CAD with calcified plaque in proximal and mid LAD causing minimal (0-24%) stenosis  CAD-RADS 1. Minimal non-obstructive CAD (0-24%). Consider non-atherosclerotic causes of chest pain. Consider preventive therapy and risk factor modification.   Electronically Signed By: Epifanio Lesches M.D. On: 10/11/2021 15:37  Narrative EXAM: OVER-READ INTERPRETATION  CT CHEST  The following report is an over-read performed by radiologist Dr. Jeronimo Greaves of [facility] on 10/11/2021. This over-read does not include interpretation of cardiac or coronary anatomy or pathology. The coronary CTA interpretation by the cardiologist is attached.  COMPARISON:  09/12/2021 chest radiograph.  FINDINGS: Vascular: Normal aortic caliber. Tortuous thoracic aorta. Aortic atherosclerosis. No central pulmonary embolism, on  this non-dedicated study.  Mediastinum/Nodes: No imaged thoracic adenopathy.  Lungs/Pleura: No pleural fluid.  Clear imaged lungs.  Upper Abdomen: Normal imaged portions of the liver, spleen, stomach.  Musculoskeletal: Lower thoracic spondylosis.  IMPRESSION: No acute findings in the imaged extracardiac chest.  Aortic Atherosclerosis (ICD10-I70.0).  Electronically Signed: By: Jeronimo Greaves M.D. On: 10/11/2021 14:27          Recent Labs: 05/19/2022: Hemoglobin 12.8; Platelets 210 09/27/2022: ALT 18; BUN 8; Creatinine, Ser 0.75; Potassium 3.8; Sodium 140; TSH 2.270 12/26/2022: Magnesium 2.3  Recent Lipid Panel    Component Value Date/Time   CHOL 186 09/27/2022 1538   TRIG 69 09/27/2022 1538   HDL 64 09/27/2022 1538   CHOLHDL 2.9 09/27/2022 1538   CHOLHDL 3.3 04/15/2020 1404   VLDL 15 06/01/2016 1619   LDLCALC 109 (H) 09/27/2022 1538   LDLCALC 103 (H) 04/15/2020 1404    Physical Exam:    VS:  BP 122/70   Pulse 81   Ht 5\' 7"  (1.702 m)   Wt 157 lb (71.2 kg)   SpO2 96%   BMI 24.59 kg/m     Wt Readings from Last 3 Encounters:  02/17/23 157 lb (71.2 kg)  01/16/23 163 lb (73.9 kg)  12/27/22 163 lb (73.9 kg)     Gen: no distress   Neck: No JVD Cardiac: No Rubs or Gallops, no murmur, RRR +2 radial pulses Respiratory: Clear to auscultation bilaterally, normal effort, normal  respiratory rate GI: Soft, nontender, non-distended normal MS: No  edema;  moves all extremities Integument: Skin feels warm Neuro:  At time of evaluation, alert and oriented to person/place/time/situation  Psych: Normal affect, patient feels ok   ASSESSMENT:    1. Essential hypertension   2. Mixed hyperlipidemia   3. Coronary artery disease due to lipid rich plaque     PLAN:    CAD HLD - LDL above goal on current supplements - she has made dietary changes and save for milk has little options for diet changes - we have discussed some exercise options, joining a gym - will get  rosuvastatin 5 mg PO daily and labs in three months - when LDL < 55 off medications we will try to be off rosuvastatin   HTN - controlled on HCTZ  One year follow up       Medication Adjustments/Labs and Tests Ordered: Current medicines are reviewed at length with the patient today.  Concerns regarding medicines are outlined above.  Orders Placed This Encounter  Procedures   ALT   Lipid panel   EKG 12-Lead   Meds ordered this encounter  Medications   rosuvastatin (CRESTOR) 5 MG tablet    Sig: Take 1 tablet (5 mg total) by mouth daily.    Dispense:  90 tablet    Refill:  3    Patient Instructions  Medication Instructions:  Your physician has recommended you make the following change in your medication:  START: rosuvastatin (Crestor) 5 mg by mouth once daily at dinner time.   *If you need a refill on your cardiac medications before your next appointment, please call your pharmacy*   Lab Work: IN 3 MONTHS: fasting lipid panel and ALT (nothing to eat or drink except water 8 hours prior)  If you have labs (blood work) drawn today and your tests are completely normal, you will receive your results only by: MyChart Message (if you have MyChart) OR A paper copy in the mail If you have any lab test that is abnormal or we need to change your treatment, we will call you to review the results.   Testing/Procedures: NONE   Follow-Up: At [facility], you and your health needs are our priority.  As part of our continuing mission to provide you with exceptional heart care, we have created designated Provider Care Teams.  These Care Teams include your primary Cardiologist (physician) and Advanced Practice Providers (APPs -  Physician Assistants and Nurse Practitioners) who all work together to provide you with the care you need, when you need it.   Your next appointment:   1 year(s)  Provider:   Christell Constant, MD        Signed, Christell Constant,  MD  02/17/2023 5:21 PM    [facility]

## 2023-02-23 ENCOUNTER — Ambulatory Visit: Payer: Medicare Other | Admitting: Family Medicine

## 2023-03-19 ENCOUNTER — Other Ambulatory Visit: Payer: Self-pay | Admitting: Family Medicine

## 2023-03-29 ENCOUNTER — Ambulatory Visit (INDEPENDENT_AMBULATORY_CARE_PROVIDER_SITE_OTHER): Payer: Medicare Other | Admitting: Family Medicine

## 2023-03-29 ENCOUNTER — Encounter: Payer: Self-pay | Admitting: Family Medicine

## 2023-03-29 VITALS — BP 126/70 | HR 94 | Ht 67.0 in | Wt 158.1 lb

## 2023-03-29 DIAGNOSIS — E559 Vitamin D deficiency, unspecified: Secondary | ICD-10-CM | POA: Diagnosis not present

## 2023-03-29 DIAGNOSIS — Z59819 Housing instability, housed unspecified: Secondary | ICD-10-CM

## 2023-03-29 DIAGNOSIS — I10 Essential (primary) hypertension: Secondary | ICD-10-CM | POA: Diagnosis not present

## 2023-03-29 DIAGNOSIS — F418 Other specified anxiety disorders: Secondary | ICD-10-CM | POA: Diagnosis not present

## 2023-03-29 DIAGNOSIS — E785 Hyperlipidemia, unspecified: Secondary | ICD-10-CM

## 2023-03-29 NOTE — Patient Instructions (Addendum)
F/U in 51/2 months, call if you need me before  I totally agree that when you find your new home  you wil be 100 % better  No changes in medications I prescribe  All the best for 2025  CBC, fasting lipid, cmp and eGFR, tSH and vit D 1 week before next  scheduled appt in 2025  Thanks for choosing [facility], we consider it a privelige to serve you.

## 2023-04-03 DIAGNOSIS — E559 Vitamin D deficiency, unspecified: Secondary | ICD-10-CM | POA: Insufficient documentation

## 2023-04-03 NOTE — Assessment & Plan Note (Signed)
Hyperlipidemia:Low fat diet discussed and encouraged.   Lipid Panel  Lab Results  Component Value Date   CHOL 186 09/27/2022   HDL 64 09/27/2022   LDLCALC 109 (H) 09/27/2022   TRIG 69 09/27/2022   CHOLHDL 2.9 09/27/2022     Updated lab needed at/ before next visit.

## 2023-04-03 NOTE — Assessment & Plan Note (Signed)
Updated lab needed at/ before next visit.   

## 2023-04-03 NOTE — Assessment & Plan Note (Signed)
Controlled, no change in medication DASH diet and commitment to daily physical activity for a minimum of 30 minutes discussed and encouraged, as a part of hypertension management. The importance of attaining a healthy weight is also discussed.     03/29/2023    3:05 PM 02/17/2023    3:15 PM 01/16/2023    1:54 PM 12/27/2022    3:40 PM 12/27/2022    3:14 PM 12/27/2022    3:12 PM 10/25/2022    2:59 PM  BP/Weight  Systolic BP 126 122 -- 110 121 144 137  Diastolic BP 70 70 -- 70 75 81 85  Wt. (Lbs) 158.12 157 163   163 160  BMI 24.77 kg/m2 24.59 kg/m2 25.53 kg/m2   25.53 kg/m2 25.06 kg/m2

## 2023-04-03 NOTE — Assessment & Plan Note (Signed)
Continue current medication, will benefit from therapy and agrees, referral entered

## 2023-04-03 NOTE — Progress Notes (Signed)
Tiffany Mendoza     MRN: [id number]      DOB: 07-08-1950  Chief Complaint  Patient presents with   Follow-up    Follow up    HPI Tiffany Mendoza is here for follow up and re-evaluation of chronic medical conditions, medication management and review of any available recent lab and radiology data.  Preventive health is updated, specifically  Cancer screening and Immunization.   Remains stressed as she waits for new housing and continues to have poor relationship with her sisters, who she tries too keep out of her life as she finds it distressing. Though depression screen is not impressive she struggles with coping with the situation , has had therapy through hospice after her Mother's passing but that has ended and she iwll benefit from, needs and agrees to ongoing therapy The PT denies any adverse reactions to current medications since the last visit.  Patient and/or legal guardian verbally consented to [facility] services about presenting concerns and psychiatric consultation as appropriate.  The services will be billed as appropriate for the patient   ROS Denies recent fever or chills. Denies sinus pressure, nasal congestion, ear pain or sore throat. Denies chest congestion, productive cough or wheezing. Denies chest pains, palpitations and leg swelling Denies abdominal pain, nausea, vomiting,diarrhea or constipation.   Denies dysuria, frequency, hesitancy or incontinence. Denies joint pain, swelling and limitation in mobility. Denies headaches, seizures, numbness, or tingling. Denies skin break down or rash.  .     PE  BP 126/70 (BP Location: Right Arm, Patient Position: Sitting, Cuff Size: Normal)   Pulse 94   Ht 5\' 7"  (1.702 m)   Wt 158 lb 1.9 oz (71.7 kg)   SpO2 96%   BMI 24.77 kg/m   Patient alert and oriented and in no cardiopulmonary distress.  HEENT: No facial asymmetry, EOMI,     Neck supple .  Chest: Clear to auscultation  bilaterally.  CVS: S1, S2 no murmurs, no S3.Regular rate.  ABD: Soft non tender.   Ext: No edema  MS: Adequate ROM spine, shoulders, hips and knees.  Skin: Intact, no ulcerations or rash noted.  Psych: Good eye contact, normal affect. Memory intact not anxious or depressed appearing.  CNS: CN 2-12 intact, power,  normal throughout.no focal deficits noted.   Assessment & Plan  Essential hypertension Controlled, no change in medication DASH diet and commitment to daily physical activity for a minimum of 30 minutes discussed and encouraged, as a part of hypertension management. The importance of attaining a healthy weight is also discussed.     03/29/2023    3:05 PM 02/17/2023    3:15 PM 01/16/2023    1:54 PM 12/27/2022    3:40 PM 12/27/2022    3:14 PM 12/27/2022    3:12 PM 10/25/2022    2:59 PM  BP/Weight  Systolic BP 126 122 -- 110 121 144 137  Diastolic BP 70 70 -- 70 75 81 85  Wt. (Lbs) 158.12 157 163   163 160  BMI 24.77 kg/m2 24.59 kg/m2 25.53 kg/m2   25.53 kg/m2 25.06 kg/m2       Depression with anxiety Continue current medication, will benefit from therapy and agrees, referral entered  Housing insecurity Still awaiting alternate housing, still living in her deceased Mother's home which she states her sibs want to sell  Dyslipidemia Hyperlipidemia:Low fat diet discussed and encouraged.   Lipid Panel  Lab Results  Component Value Date   CHOL 186  09/27/2022   HDL 64 09/27/2022   LDLCALC 109 (H) 09/27/2022   TRIG 69 09/27/2022   CHOLHDL 2.9 09/27/2022     Updated lab needed at/ before next visit.   Vitamin D deficiency Updated lab needed at/ before next visit.

## 2023-04-03 NOTE — Assessment & Plan Note (Signed)
Still awaiting alternate housing, still living in her deceased Mother's home which she states her sibs want to sell

## 2023-04-14 ENCOUNTER — Other Ambulatory Visit: Payer: Self-pay | Admitting: Family Medicine

## 2023-04-14 MED ORDER — CLORAZEPATE DIPOTASSIUM 7.5 MG PO TABS: 7.5000 mg | ORAL_TABLET | Freq: Two times a day (BID) | ORAL | 5 refills | Status: DC | PRN

## 2023-04-14 NOTE — Addendum Note (Signed)
Addended by: Syliva Overman E on: 04/14/2023 12:52 PM   Modules accepted: Orders

## 2023-04-17 ENCOUNTER — Other Ambulatory Visit: Payer: Self-pay | Admitting: Family Medicine

## 2023-04-19 ENCOUNTER — Ambulatory Visit: Payer: Self-pay | Admitting: *Deleted

## 2023-04-19 NOTE — Patient Instructions (Signed)
Visit Information  Thank you for taking time to visit with me today. Please don't hesitate to contact me if I can be of assistance to you.   Following are the goals we discussed today:   Goals Addressed   None     Our next appointment is by telephone on 05/20/23 at 3 pm  Please call the care guide team at [phone] if you need to cancel or reschedule your appointment.   If you are experiencing a Mental Health or Behavioral Health Crisis or need someone to talk to, please call the Suicide and Crisis Lifeline: 988 call the [place] National Suicide Prevention Lifeline: 239-587-0897 or TTY: 239-073-4663 TTY 330-849-2341) to talk to a trained counselor call 1-800-273-TALK (toll free, 24 hour hotline) call the [facility]: [phone] call 911   Patient verbalizes understanding of instructions and care plan provided today and agrees to view in MyChart. Active MyChart status and patient understanding of how to access instructions and care plan via MyChart confirmed with patient.     The patient has been provided with contact information for the care management team and has been advised to call with any health related questions or concerns.   Jaritza Duignan L. Noelle Penner, RN, BSN, [facility]  VBCI Care Management Coordinator  [phone]  Fax: [phone]

## 2023-04-19 NOTE — Patient Outreach (Signed)
Care Coordination   Follow Up Visit Note   04/20/2023 Name: Tiffany Mendoza MRN: [id number] DOB: [birth year]  Tiffany Mendoza is a 72 y.o. year old female who sees [name], [name], MD for primary care. I spoke with  Tiffany Mendoza by phone today.  What matters to the patients health and wellness today?  Diet, [place] apartments, sale of her land, staying active with friends, nephew, depression- pending authorization for counseling, time to discuss her concerns/improvements    Tiffany Mendoza, April responded to her. She and a friend went to visit after the apartments got inspected last Wednesday 04/12/23. February 22 2023 she did her application and dropped it in the complex box  Has her class mates & nephew assisting her financially. Nephew is being an advocate for her with her sister. Her nephew has her items stored in [place].  She has spoken with a lawyer about her sister evicting from her deceased mother's home, isolated on a dead end road. Plans to sell her land to the neighbors near her mother's home who assist her with her garbage   She ask her pharmacist about her stomach symptoms. She now eat every 2-3 hours eat to keep her stomach acid under control. She also realize that she has been dealing with a lot of stress  She questions if she has long covid symptoms after seeing her pcp on 03/29/23. She is wondering if her appetite changes are a symptom  Patient speaking from subject to subject with RN CM making attempts to interject. She discussed her anxiety  Confirms no appointment made with a counselor yet & pending the response of a referral. Reports she is unsure of counseling related to finances, voiced with concern of charges before and after counseling approved   Discussed high cholesterol using coenzyme Q10, Crestor, values increasing Questions if they are increasing related to stress  Introduced Patient to a local female, T[place] Mendoza. Who is  experiencing very similar symptoms & experiences     Goals Addressed             This Visit's Progress    Housing concern, Manage depression & hypertension-nurse care coordination service   On track    Interventions Today    Flowsheet Row Most Recent Value  Chronic Disease   Chronic disease during today's visit Other  [?long covid, housing status,]  General Interventions   General Interventions Discussed/Reviewed General Interventions Reviewed, Communication with, Doctor Visits, Community Resources  Doctor Visits Discussed/Reviewed Doctor Visits Reviewed, PCP  PCP/Specialist Visits Compliance with follow-up visit  Communication with PCP/Specialists  [sent patient inquiry about possible long covid GI symptoms to pcp]  Education Interventions   Education Provided Provided Education  [stress, anxiety, high cholesterol, depression, adjustment disorder]  Provided Verbal Education On Other, Sick Day Rules  [long covid symptoms]  Mental Health Interventions   Mental Health Discussed/Reviewed Mental Health Reviewed, Coping Strategies, Depression, Grief and Loss  [inquired about the status of her referral to counselors]  Nutrition Interventions   Nutrition Discussed/Reviewed Nutrition Reviewed, Supplemental nutrition, Portion sizes  [encouraged small meals]  Safety Interventions   Safety Discussed/Reviewed Safety Discussed, Home Safety  [belongings to be moved confirmed to be in storage in [place]]             SDOH assessments and interventions completed:  No     Care Coordination Interventions:  Yes, provided   Follow up plan: Follow up call scheduled for 04/28/23 3:30 pm  Encounter Outcome:  Patient Visit Completed    Tiffany Bradford L. Noelle Penner, RN, BSN, [facility]  VBCI Care Management Coordinator  [phone]  Fax: [phone]

## 2023-04-25 ENCOUNTER — Ambulatory Visit: Payer: Medicare Other | Admitting: Family Medicine

## 2023-04-28 ENCOUNTER — Encounter: Payer: Medicare Other | Admitting: *Deleted

## 2023-05-02 ENCOUNTER — Other Ambulatory Visit: Payer: Self-pay | Admitting: Family Medicine

## 2023-05-04 ENCOUNTER — Encounter: Payer: Self-pay | Admitting: *Deleted

## 2023-05-16 ENCOUNTER — Ambulatory Visit: Payer: Medicare Other | Attending: Internal Medicine

## 2023-05-16 DIAGNOSIS — I10 Essential (primary) hypertension: Secondary | ICD-10-CM

## 2023-05-16 DIAGNOSIS — I2510 Atherosclerotic heart disease of native coronary artery without angina pectoris: Secondary | ICD-10-CM

## 2023-05-16 DIAGNOSIS — E782 Mixed hyperlipidemia: Secondary | ICD-10-CM

## 2023-05-17 ENCOUNTER — Ambulatory Visit: Payer: Self-pay | Admitting: *Deleted

## 2023-05-17 LAB — LIPID PANEL
Chol/HDL Ratio: 1.9 {ratio} (ref 0.0–4.4)
Cholesterol, Total: 142 mg/dL (ref 100–199)
HDL: 74 mg/dL (ref >39)
LDL Chol Calc (NIH): 57 mg/dL (ref 0–99)
Triglycerides: 48 mg/dL (ref 0–149)
VLDL Cholesterol Cal: 11 mg/dL (ref 5–40)

## 2023-05-17 LAB — ALT: ALT: 29 [IU]/L (ref 0–32)

## 2023-05-17 NOTE — Patient Outreach (Signed)
  Care Coordination   05/17/2023 Name: Tiffany Mendoza MRN: [id number] DOB: June 28, 1950   Care Coordination Outreach Attempts:  An unsuccessful telephone outreach was attempted for a scheduled appointment today.  Follow Up Plan:  Additional outreach attempts will be made to offer the patient care coordination information and services.   Encounter Outcome:  No Answer   Care Coordination Interventions:  Yes, provided Outreaches unsuccessful to her home and mobile numbers   Jonathandavid Marlett L. Noelle Penner, RN, BSN, [facility]  VBCI Care Management Coordinator  [phone]  Fax: [phone]

## 2023-05-19 ENCOUNTER — Other Ambulatory Visit: Payer: Self-pay | Admitting: Family Medicine

## 2023-05-19 ENCOUNTER — Ambulatory Visit: Payer: Self-pay | Admitting: *Deleted

## 2023-05-19 NOTE — Patient Outreach (Addendum)
  Care Coordination   Follow Up Visit Note   05/19/2023 Name: [name] MRN: [id number] DOB: 1950-08-13  Tiffany Mendoza is a 72 y.o. year old female who sees [name], [name], MD for primary care. I spoke with  [name] by phone today after patient left RN CM a message.  What matters to the patients health and wellness today?  Housing, Weather,   Norm Salt has completed her application on 02/22/23 but has not received any response if she has been accepted. The [place] apartment website states "Coming Fall 2024" She saw & listened to a television report about the [place] apartments related to an excessive amount of applications being received with pending responses She also completed an application for [employer] but there was complications with verifying her employment. She has been back to follow up and received a letter stating she had not turned in information. She did visit the office as she had turned in all items & spoke with [name].   Change in Weather- Cold  Hyperlipidemia-  she discussed her visit with cardiology.  She did not take the statin, rosuvastatin She did change her nutrition LDL Chol Calc (NIH) 0 - 99 mg/dL 57 409 High     Changes in taste - She confirmed she does not want to have testing for taste changes. She relates this to her stress & improvement with stress has caused a change.  Support from a friend who is a retired Retail banker Addressed             This Visit's Progress    Housing, Manage depression & hypertension/hyperlipidemia-nurse care coordination service   On track    Interventions Today    Flowsheet Row Most Recent Value  Chronic Disease   Chronic disease during today's visit Other  [housing, weather, prevoius change in taste, Hyperlipidemia with improvement]  General Interventions   General Interventions Discussed/Reviewed General Interventions Reviewed, Lipid Profile, [facility],  Doctor Visits, Communication with  Labs --  [lipid panel]  Doctor Visits Discussed/Reviewed Doctor Visits Reviewed, Specialist, PCP  PCP/Specialist Visits Compliance with follow-up visit  Communication with PCP/Specialists  [sent pcp an update on patient and her voiced information about her change of taste]  Education Interventions   Education Provided Provided Education  [changes in taste Lipid (LDL)]  Provided Verbal Education On Nutrition, Labs, Medication  [cholesterol medicine]             SDOH assessments and interventions completed:  No     Care Coordination Interventions:  Yes, provided   Follow up plan: Follow up call scheduled for 06/30/23    Encounter Outcome:  Patient Visit Completed   [name], RN, BSN, [facility]  VBCI Care Management Coordinator  [phone]  Fax: [phone]

## 2023-05-19 NOTE — Patient Instructions (Addendum)
Visit Information  Mrs Kayren Eaves [phone]/call  Thank you for taking time to visit with me today. Please don't hesitate to contact me if I can be of assistance to you.   How to maintain LDL Eat healthy fats: Choose foods with monounsaturated fats, like olive oil, canola oil, and nuts. Limit foods with saturated fats, which are found in animal products, tropical oils, and processed foods.  Eat more fiber: Soluble fiber can help prevent your body from absorbing cholesterol. You can find soluble fiber in whole grains, fruits, vegetables, and legumes.  Exercise regularly: Aim for at least 150 minutes of exercise per week, such as walking, swimming, or cycling.  Maintain a healthy weight: Talk to your doctor about your ideal weight range.  Quit smoking: If you smoke, vape, or use tobacco products, consider quitting.  Limit alcohol: Try to limit your alcohol consumption.  Reduce stress: Stress can raise your LDL cholesterol, so try practices like yoga or deep breathing exercises.  Take medications: Some medications may contribute to higher LDL levels, so discuss your medical regimen with your doctor.  Lowering your LDL cholesterol can reduce your risk of developing cardiovascular disease, heart attack, or stroke   Not wanting to eat certain things or Dysgeusia A taste disorder that causes food to taste different than it should. This can be caused by a number of things, including:  Medications, such as antibiotics, antidepressants, chemotherapy drugs, and over-the-counter allergy medications  Infections, such as upper respiratory and middle ear infections, including COVID-19  Dental problems, such as poor oral hygiene or dentures  Aging  Chemical exposure, such as insecticides  Head injury  Surgery to the ear, nose, and throat  Radiation therapy for head and neck cancers  Chronic kidney disease People with chronic kidney disease often experience changes in taste, such as a metallic, bitter, or  bland taste.  Sensory processing challenges Children may have food aversions to new or unknown foods, or foods related to a previous trauma.  Avoidant/restrictive food intake disorder (ARFID) A condition that limits food intake due to fear or anxiety about food or eating.  Dysgeusia usually resolves on its own, but you may need to treat the underlying cause or try nutritional supplementation or dietary changes.  Dysgeusia (Altered Taste):   Following are the goals we discussed today:   Goals Addressed             This Visit's Progress    Housing, Manage depression & hypertension/hyperlipidemia-nurse care coordination service   On track    Interventions Today    Flowsheet Row Most Recent Value  Chronic Disease   Chronic disease during today's visit Other  [housing, weather, prevoius change in taste, Hyperlipidemia with improvement]  General Interventions   General Interventions Discussed/Reviewed General Interventions Reviewed, Lipid Profile, [facility], Doctor Visits, Communication with  Labs --  [lipid panel]  Doctor Visits Discussed/Reviewed Doctor Visits Reviewed, Specialist, PCP  PCP/Specialist Visits Compliance with follow-up visit  Communication with PCP/Specialists  [sent pcp an update on patient and her voiced information about her change of taste]  Education Interventions   Education Provided Provided Education  [changes in taste Lipid (LDL)]  Provided Verbal Education On Nutrition, Labs, Medication  [cholesterol medicine]             Our next appointment is by telephone on 06/30/23 at 3:30 pm  Please call the care guide team at [phone] if you need to cancel or reschedule your appointment.   If you  are experiencing a Mental Health or Behavioral Health Crisis or need someone to talk to, please call the Suicide and Crisis Lifeline: 988 call the [place] National Suicide Prevention Lifeline: (562)359-9848 or TTY: (351)363-4691 TTY 867-503-0952) to talk to a  trained counselor call 1-800-273-TALK (toll free, 24 hour hotline) call the [facility]: [phone] call 911   Patient verbalizes understanding of instructions and care plan provided today and agrees to view in MyChart. Active MyChart status and patient understanding of how to access instructions and care plan via MyChart confirmed with patient.     The patient has been provided with contact information for the care management team and has been advised to call with any health related questions or concerns.   Cynde Menard L. Noelle Penner, RN, BSN, [facility]  VBCI Care Management Coordinator  [phone]  Fax: [phone]

## 2023-05-25 ENCOUNTER — Inpatient Hospital Stay: Payer: Medicare Other | Attending: Hematology

## 2023-05-25 DIAGNOSIS — Z83719 Family history of colon polyps, unspecified: Secondary | ICD-10-CM | POA: Diagnosis not present

## 2023-05-25 DIAGNOSIS — Z800 Family history of malignant neoplasm of digestive organs: Secondary | ICD-10-CM | POA: Diagnosis not present

## 2023-05-25 DIAGNOSIS — Z8349 Family history of other endocrine, nutritional and metabolic diseases: Secondary | ICD-10-CM | POA: Insufficient documentation

## 2023-05-25 DIAGNOSIS — R110 Nausea: Secondary | ICD-10-CM | POA: Diagnosis not present

## 2023-05-25 DIAGNOSIS — I10 Essential (primary) hypertension: Secondary | ICD-10-CM | POA: Diagnosis not present

## 2023-05-25 DIAGNOSIS — Z87891 Personal history of nicotine dependence: Secondary | ICD-10-CM | POA: Insufficient documentation

## 2023-05-25 DIAGNOSIS — Z8042 Family history of malignant neoplasm of prostate: Secondary | ICD-10-CM | POA: Diagnosis not present

## 2023-05-25 DIAGNOSIS — Z79899 Other long term (current) drug therapy: Secondary | ICD-10-CM | POA: Insufficient documentation

## 2023-05-25 DIAGNOSIS — Z818 Family history of other mental and behavioral disorders: Secondary | ICD-10-CM | POA: Insufficient documentation

## 2023-05-25 DIAGNOSIS — R768 Other specified abnormal immunological findings in serum: Secondary | ICD-10-CM

## 2023-05-25 DIAGNOSIS — F32A Depression, unspecified: Secondary | ICD-10-CM | POA: Insufficient documentation

## 2023-05-25 DIAGNOSIS — Z90710 Acquired absence of both cervix and uterus: Secondary | ICD-10-CM | POA: Insufficient documentation

## 2023-05-25 DIAGNOSIS — Z8249 Family history of ischemic heart disease and other diseases of the circulatory system: Secondary | ICD-10-CM | POA: Diagnosis not present

## 2023-05-25 DIAGNOSIS — E8581 Light chain (AL) amyloidosis: Secondary | ICD-10-CM | POA: Diagnosis present

## 2023-05-25 DIAGNOSIS — M791 Myalgia, unspecified site: Secondary | ICD-10-CM | POA: Insufficient documentation

## 2023-05-25 DIAGNOSIS — Z8719 Personal history of other diseases of the digestive system: Secondary | ICD-10-CM | POA: Diagnosis not present

## 2023-05-25 LAB — COMPREHENSIVE METABOLIC PANEL
ALT: 36 U/L (ref 0–44)
AST: 29 U/L (ref 15–41)
Albumin: 4.3 g/dL (ref 3.5–5.0)
Alkaline Phosphatase: 43 U/L (ref 38–126)
Anion gap: 8 (ref 5–15)
BUN: 12 mg/dL (ref 8–23)
CO2: 28 mmol/L (ref 22–32)
Calcium: 9.4 mg/dL (ref 8.9–10.3)
Chloride: 102 mmol/L (ref 98–111)
Creatinine, Ser: 0.73 mg/dL (ref 0.44–1.00)
GFR, Estimated: >60 mL/min (ref >60)
Glucose, Bld: 99 mg/dL (ref 70–99)
Potassium: 3.5 mmol/L (ref 3.5–5.1)
Sodium: 138 mmol/L (ref 135–145)
Total Bilirubin: 0.6 mg/dL (ref <1.2)
Total Protein: 7.6 g/dL (ref 6.5–8.1)

## 2023-05-25 LAB — CBC WITH DIFFERENTIAL/PLATELET
Abs Immature Granulocytes: 0 10*3/uL (ref 0.00–0.07)
Basophils Absolute: 0 10*3/uL (ref 0.0–0.1)
Basophils Relative: 1 %
Eosinophils Absolute: 0.1 10*3/uL (ref 0.0–0.5)
Eosinophils Relative: 2 %
HCT: 40.3 % (ref 36.0–46.0)
Hemoglobin: 13.4 g/dL (ref 12.0–15.0)
Immature Granulocytes: 0 %
Lymphocytes Relative: 39 %
Lymphs Abs: 2 10*3/uL (ref 0.7–4.0)
MCH: 32 pg (ref 26.0–34.0)
MCHC: 33.3 g/dL (ref 30.0–36.0)
MCV: 96.2 fL (ref 80.0–100.0)
Monocytes Absolute: 0.3 10*3/uL (ref 0.1–1.0)
Monocytes Relative: 6 %
Neutro Abs: 2.7 10*3/uL (ref 1.7–7.7)
Neutrophils Relative %: 52 %
Platelets: 209 10*3/uL (ref 150–400)
RBC: 4.19 MIL/uL (ref 3.87–5.11)
RDW: 12.4 % (ref 11.5–15.5)
WBC: 5.1 10*3/uL (ref 4.0–10.5)
nRBC: 0 % (ref 0.0–0.2)

## 2023-05-26 ENCOUNTER — Ambulatory Visit: Payer: Self-pay | Admitting: *Deleted

## 2023-05-26 LAB — KAPPA/LAMBDA LIGHT CHAINS
Kappa free light chain: 23.2 mg/L — ABNORMAL HIGH (ref 3.3–19.4)
Kappa, lambda light chain ratio: 2.34 — ABNORMAL HIGH (ref 0.26–1.65)
Lambda free light chains: 9.9 mg/L (ref 5.7–26.3)

## 2023-05-26 NOTE — Patient Outreach (Signed)
  Care Coordination   05/26/2023 Name: Tiffany Mendoza MRN: [id number] DOB: 02/04/1951   Care Coordination Outreach Attempts:  An unsuccessful telephone outreach was attempted today to offer the patient information about available care coordination services.  Follow Up Plan:  Additional outreach attempts will be made to offer the patient care coordination information and services.   Encounter Outcome:  No Answer   Care Coordination Interventions:  No, not indicated    Dontrell Stuck L. Noelle Penner, RN, BSN, [facility]  VBCI Care Management Coordinator  [phone]  Fax: [phone]

## 2023-05-26 NOTE — Patient Outreach (Signed)
  Care Coordination   Follow Up Visit Note   05/26/2023 Name: Tiffany Mendoza MRN: [id number] DOB: 1951/03/20  Tiffany Mendoza is a 72 y.o. year old female who sees Tiffany Mendoza, Tiffany Mallick, MD for primary care. I spoke with  Tiffany Mendoza by phone today when patient returned a call.  What matters to the patients health and wellness today?  Appetite, home repair/rodent, socializing   Appetite- Voices understanding of pcp response to her appetite changes related to her fear and anxiety. She agrees with this as she reports when she is out of her present home, her poor appetite is not trigger.   Socialization- spent time speaking with a female in a store- She states she will call him at a later time.  Anxiety/Grief- Received a 13 week grief session after her mother passed but does not it was beneficial. No present counselor/therapist. Was referred by pcp on 04/03/23 to Tiffany Mendoza- she will call the office to get an appointment as needed Expressed her emotions related to leaving her mother at the skilled nursing facility Wrote down the regional ombudsman number & voiced understanding of Yelp use for facility reviews    Goals Addressed             This Visit's Progress    Housing, Manage anxiety/depression & hypertension/hyperlipidemia-nurse care coordination service   On track    Interventions Today    Flowsheet Row Most Recent Value  Chronic Disease   Chronic disease during today's visit Other  [appetite, pest, triggers]  General Interventions   General Interventions Discussed/Reviewed General Interventions Reviewed, [facility], Doctor Visits  Doctor Visits Discussed/Reviewed Doctor Visits Reviewed, PCP, Specialist  [confirmed no counselor/therapist]  PCP/Specialist Visits Compliance with follow-up visit  Communication with PCP/Specialists  [collaborated with pcp.  Secure chat to [employer] staff, Tiffany Mendoza & RN to see how to proceed with services for patient]  Education  Interventions   Education Provided Provided Education  [regional long term ombudsman services - provided patient with the number when she voiced interest in expressing feelings related to her mother]  Mental Health Interventions   Mental Health Discussed/Reviewed Mental Health Reviewed, Grief and Loss, Coping Strategies, Anxiety             SDOH assessments and interventions completed:  No     Care Coordination Interventions:  Yes, provided   Follow up plan: Follow up call scheduled for 06/30/23    Encounter Outcome:  Patient Visit Completed   Tiffany Bradford L. Noelle Penner, RN, BSN, [facility]  VBCI Care Management Coordinator  [phone]  Fax: [phone]

## 2023-05-26 NOTE — Patient Instructions (Signed)
Visit Information  Thank you for taking time to visit with me today. Please don't hesitate to contact me if I can be of assistance to you.   Following are the goals we discussed today:   Goals Addressed             This Visit's Progress    Housing, Manage depression & hypertension/hyperlipidemia-nurse care coordination service   On track    Interventions Today    Flowsheet Row Most Recent Value  Chronic Disease   Chronic disease during today's visit Other  [appetite, pest, triggers]  General Interventions   General Interventions Discussed/Reviewed General Interventions Reviewed, [facility], Doctor Visits  Doctor Visits Discussed/Reviewed Doctor Visits Reviewed, PCP, Specialist  [confirmed no counselor/therapist]  PCP/Specialist Visits Compliance with follow-up visit  Communication with PCP/Specialists  [collaborated with pcp.  Secure chat to pcp [facility] staff, Lynnell Chad & RN to see how to proceed with services for patient]  Education Interventions   Education Provided Provided Education  [regional long term ombudsman services - provided patient with the number when she voiced interest in expressing feelings related to her mother]  Mental Health Interventions   Mental Health Discussed/Reviewed Mental Health Reviewed, Grief and Loss, Coping Strategies, Anxiety             Our next appointment is by telephone on 06/29/22 at 3:30 pm   Please call the care guide team at [phone] if you need to cancel or reschedule your appointment.   If you are experiencing a Mental Health or Behavioral Health Crisis or need someone to talk to, please call the Suicide and Crisis Lifeline: 988 call the [place] National Suicide Prevention Lifeline: 3171934578 or TTY: (223)704-1170 TTY 618-317-4575) to talk to a trained counselor call 1-800-273-TALK (toll free, 24 hour hotline) call the [facility]: [phone] call 911   Patient verbalizes understanding of instructions  and care plan provided today and agrees to view in MyChart. Active MyChart status and patient understanding of how to access instructions and care plan via MyChart confirmed with patient.     The patient has been provided with contact information for the care management team and has been advised to call with any health related questions or concerns.   Arnetha Silverthorne L. Noelle Penner, RN, BSN, [facility]  VBCI Care Management Coordinator  [phone]  Fax: [phone]

## 2023-05-31 ENCOUNTER — Telehealth: Payer: Self-pay | Admitting: Gastroenterology

## 2023-05-31 ENCOUNTER — Telehealth: Payer: Self-pay | Admitting: Family Medicine

## 2023-05-31 NOTE — Telephone Encounter (Signed)
Patient calling in regards to a small bum located around rectum area. Please advise.   Thank you

## 2023-05-31 NOTE — Telephone Encounter (Signed)
   Copied from CRM [id number]. Topic: Appointments - Scheduling Inquiry for Clinic >> May 31, 2023  2:59 PM Colletta Maryland S wrote: Reason for CRM: Pt would like to inquire about setting up an appt with counselor [name]

## 2023-06-01 ENCOUNTER — Inpatient Hospital Stay (HOSPITAL_BASED_OUTPATIENT_CLINIC_OR_DEPARTMENT_OTHER): Payer: Medicare Other | Admitting: Hematology

## 2023-06-01 VITALS — BP 153/76 | HR 80 | Temp 97.5°F | Resp 18 | Wt 158.2 lb

## 2023-06-01 DIAGNOSIS — R768 Other specified abnormal immunological findings in serum: Secondary | ICD-10-CM | POA: Diagnosis not present

## 2023-06-01 DIAGNOSIS — E8581 Light chain (AL) amyloidosis: Secondary | ICD-10-CM | POA: Diagnosis not present

## 2023-06-01 LAB — PROTEIN ELECTROPHORESIS, SERUM
A/G Ratio: 1.4 (ref 0.7–1.7)
Albumin ELP: 4.3 g/dL (ref 2.9–4.4)
Alpha-1-Globulin: 0.2 g/dL (ref 0.0–0.4)
Alpha-2-Globulin: 0.8 g/dL (ref 0.4–1.0)
Beta Globulin: 0.9 g/dL (ref 0.7–1.3)
Gamma Globulin: 1.2 g/dL (ref 0.4–1.8)
Globulin, Total: 3 g/dL (ref 2.2–3.9)
Total Protein ELP: 7.3 g/dL (ref 6.0–8.5)

## 2023-06-01 NOTE — Progress Notes (Signed)
[facility] [address], [place] [address]    Clinic Day:  06/01/2023  Referring physician: Kerri Perches, MD  Patient Care Team: Kerri Perches, MD as PCP - General Christell Constant, MD as PCP - Cardiology (Cardiology) Freddy Finner, MD (Inactive) as Consulting Physician (Obstetrics and Gynecology) Jethro Bolus, MD as Consulting Physician (Ophthalmology) Clinton Gallant, RN as [facility] Care Management Reuel Boom as Counselor (Professional Counselor)   ASSESSMENT & PLAN:   Assessment: 1.  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 -I reviewed her blood work from 05/08/2019.  Kappa light chains at 22.6 and ratio is 1.77.  CBC was normal.  SPEP was negative.  Creatinine was 0.8 and calcium 9.4. -Last skeletal survey on 10/20/2016 showed no new or enlarging suspicious lytic lesions.  Questionable subtle very small lucency in the proximal left humerus is stable. -Skeletal survey on 04/30/2020 with subcentimeter stable subtle indeterminate lucency in the proximal left humerus surgical neck area with no new or enlarging lytic lesions present.  Plan: 1.  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  No recent infections noted.  No fevers, night sweats or weight loss in the last 6 months. - Labs from 05/25/2023: Calcium and creatinine were normal.  SPEP is negative.  CBC was normal. - Elevated FLC ratio 2.34 stable.  Elevated kappa light chains are also stable.  At 23. - No significant changes from her baseline.  Recommend follow-up in 1 year with repeat myeloma labs.  Will also repeat a skeletal survey 24-hour urine studies.  Orders Placed This Encounter  Procedures   DG Bone Survey Met    Standing Status:   Future    Expected Date:   05/27/2024    Expiration Date:   05/31/2024     Reason for Exam (SYMPTOM  OR DIAGNOSIS REQUIRED):   MGUS    Preferred imaging location?:   [facility]   CBC with Differential    Standing Status:   Future    Expected Date:   05/27/2024    Expiration Date:   05/31/2024   Comprehensive metabolic panel    Standing Status:   Future    Expected Date:   05/27/2024    Expiration Date:   05/31/2024   24 Hr Urn UIFE/Light Chains/TP QN    Standing Status:   Future    Expected Date:   05/27/2024    Expiration Date:   05/31/2024   Kappa/lambda light chains    Standing Status:   Future    Expected Date:   05/27/2024    Expiration Date:   05/31/2024   Protein electrophoresis, serum    Standing Status:   Future    Expected Date:   05/27/2024    Expiration Date:   05/31/2024   Immunofixation electrophoresis    Standing Status:   Future    Expected Date:   05/27/2024    Expiration Date:   05/31/2024      Mikeal Hawthorne R Teague,acting as a scribe for Doreatha Massed, MD.,have documented all relevant documentation on the behalf of Doreatha Massed, MD,as directed by  Doreatha Massed, MD while in the presence of Doreatha Massed, MD.   I, Doreatha Massed MD, have reviewed the above documentation for accuracy and completeness, and I agree with the above.   Doreatha Massed, MD  12/12/20244:57 PM  CHIEF COMPLAINT:   Diagnosis: Elevated serum immunoglobulin free light chain level    Cancer Staging  No matching staging information was found for the patient.    Prior Therapy: none  Current Therapy:  surveillance    HISTORY OF PRESENT ILLNESS:   Oncology History   No history exists.     INTERVAL HISTORY:   Tiffany Mendoza is a 72 y.o. female presenting to clinic today for follow up of Elevated serum immunoglobulin free light chain level. She was last seen by me on 05/26/22.  Today, she states that she is doing well overall. Her appetite level is at 75%. Her energy level is at 50%.  She denies any B symptoms. Her  maternal niece has myeloma in her 57's. She has not been seen by a nephrologist. She reports her father had kidney issues when she was growing up and has been concerned about her kidney function. She has been attempting a healthier diet since April 2024.   PAST MEDICAL HISTORY:   Past Medical History: Past Medical History:  Diagnosis Date   Adjustment disorder with mixed anxiety and depressed mood    Allergy    Carotid bruit    Depression    Dermatophytosis of foot    GERD (gastroesophageal reflux disease)    Hypertension    Insomnia    Neck pain    Osteoporosis     Surgical History: Past Surgical History:  Procedure Laterality Date   ABDOMINAL HYSTERECTOMY  1988   HEMORRHOID SURGERY     myoectomy  1984    Social History: Social History   Socioeconomic History   Marital status: Divorced    Spouse name: Not on file   Number of children: 0   Years of education: college   Highest education level: Bachelor's degree (e.g., BA, AB, BS)  Occupational History   Occupation: caregiver  Tobacco Use   Smoking status: Former    Types: Cigarettes   Smokeless tobacco: Never  Vaping Use   Vaping status: Never Used  Substance and Sexual Activity   Alcohol use: Yes    Comment: occasional wine   Drug use: No   Sexual activity: Not Currently  Other Topics Concern   Not on file  Social History Narrative   Lives alone, divorced   Was the caregiver for her Mother prior to her passing in June 2023 who had dementia    2024 continues to live in her mother's home   Social Drivers of Health   Financial Resource Strain: Low Risk  (01/16/2023)   Overall Financial Resource Strain (CARDIA)    Difficulty of Paying Living Expenses: Not very hard  Food Insecurity: No Food Insecurity (01/16/2023)   Hunger Vital Sign    Worried About Running Out of Food in the Last Year: Never true    Ran Out of Food in the Last Year: Never true  Transportation Needs: No Transportation Needs (01/16/2023)    PRAPARE - Administrator, Civil Service (Medical): No    Lack of Transportation (Non-Medical): No  Physical Activity: Inactive (01/16/2023)   Exercise Vital Sign    Days of Exercise per Week: 0 days    Minutes of Exercise per Session: 0 min  Stress: Stress Concern Present (01/16/2023)   [employer] of Occupational Health - Occupational Stress Questionnaire    Feeling of Stress : Rather much  Social Connections: Moderately Isolated (01/16/2023)   Social Connection and Isolation Panel [NHANES]    Frequency of  Communication with Friends and Family: More than three times a week    Frequency of Social Gatherings with Friends and Family: More than three times a week    Attends Religious Services: More than 4 times per year    Active Member of [employer] or Organizations: No    Attends Banker Meetings: Never    Marital Status: Divorced  Catering manager Violence: Not At Risk (01/16/2023)   Humiliation, Afraid, Rape, and Kick questionnaire    Fear of Current or Ex-Partner: No    Emotionally Abused: No    Physically Abused: No    Sexually Abused: No    Family History: Family History  Problem Relation Age of Onset   Memory loss Mother    Colon polyps Father    Prostate cancer Father    ALS Father    Thyroid disease Sister    Thyroid nodules Sister        Thyroidectomy in 1984   Fibroids Sister    Hypertension Sister    Pancreatic cancer Maternal Grandfather    Liver cancer Paternal Grandmother    Stomach cancer Neg Hx    Colon cancer Neg Hx    Esophageal cancer Neg Hx     Current Medications:  Current Outpatient Medications:    Apoaequorin (PREVAGEN EXTRA STRENGTH PO), Take 1 each by mouth daily., Disp: , Rfl:    Ascorbic Acid (VITAMIN C) 1000 MG tablet, Take 1,000 mg by mouth daily., Disp: , Rfl:    b complex vitamins capsule, Take 1 capsule by mouth daily., Disp: , Rfl:    Cholecalciferol (VITAMIN D3 PO), Take 5,000 Units by mouth daily., Disp: , Rfl:     clorazepate (TRANXENE-T) 7.5 MG tablet, Take 1 tablet (7.5 mg total) by mouth 2 (two) times daily as needed for anxiety., Disp: 60 tablet, Rfl: 5   Garlic 100 MG TABS, Take by mouth., Disp: , Rfl:    hydrochlorothiazide (HYDRODIURIL) 25 MG tablet, Take 1 tablet by mouth once daily, Disp: 90 tablet, Rfl: 0   MAGNESIUM GLYCINATE ADVANCED PO, Take 240 capsules by mouth daily., Disp: , Rfl:    Omega-3 Fatty Acids (FISH OIL) 1000 MG CAPS, Take by mouth., Disp: , Rfl:    Polyethylene Glycol 3350 (MIRALAX PO), Take by mouth daily as needed., Disp: , Rfl:    Potassium 99 MG TABS, Take by mouth daily., Disp: , Rfl:    rosuvastatin (CRESTOR) 5 MG tablet, Take 1 tablet (5 mg total) by mouth daily., Disp: 90 tablet, Rfl: 3   sertraline (ZOLOFT) 50 MG tablet, TAKE 1 1/2 TABLETS BY MOUTH ONCE DAILY, Disp: 45 tablet, Rfl: 0   TURMERIC PO, Take 1 Dose by mouth daily as needed (supplement)., Disp: , Rfl:    Wheat Dextrin (BENEFIBER DRINK MIX) PACK, , Disp: , Rfl:    Allergies: No Known Allergies  REVIEW OF SYSTEMS:   Review of Systems  Constitutional:  Negative for chills, fatigue and fever.  HENT:   Negative for lump/mass, mouth sores, nosebleeds, sore throat and trouble swallowing.   Eyes:  Negative for eye problems.  Respiratory:  Negative for cough and shortness of breath.   Cardiovascular:  Negative for chest pain, leg swelling and palpitations.  Gastrointestinal:  Positive for nausea. Negative for abdominal pain, constipation, diarrhea and vomiting.  Genitourinary:  Negative for bladder incontinence, difficulty urinating, dysuria, frequency, hematuria and nocturia.   Musculoskeletal:  Positive for myalgias (throughout the body). Negative for arthralgias, back pain, flank pain and neck  pain.  Skin:  Negative for itching and rash.  Neurological:  Negative for dizziness, headaches and numbness.  Hematological:  Does not bruise/bleed easily.  Psychiatric/Behavioral:  Positive for depression and  sleep disturbance. Negative for suicidal ideas. The patient is nervous/anxious.   All other systems reviewed and are negative.    VITALS:   Blood pressure (!) 153/76, pulse 80, temperature (!) 97.5 F (36.4 C), temperature source Tympanic, resp. rate 18, weight 158 lb 3.2 oz (71.8 kg), SpO2 99%.  Wt Readings from Last 3 Encounters:  06/01/23 158 lb 3.2 oz (71.8 kg)  03/29/23 158 lb 1.9 oz (71.7 kg)  02/17/23 157 lb (71.2 kg)    Body mass index is 24.78 kg/m.  Performance status (ECOG): 1 - Symptomatic but completely ambulatory  PHYSICAL EXAM:   Physical Exam Vitals and nursing note reviewed. Exam conducted with a chaperone present.  Constitutional:      Appearance: Normal appearance.  Cardiovascular:     Rate and Rhythm: Normal rate and regular rhythm.     Pulses: Normal pulses.     Heart sounds: Normal heart sounds.  Pulmonary:     Effort: Pulmonary effort is normal.     Breath sounds: Normal breath sounds.  Abdominal:     Palpations: Abdomen is soft. There is no hepatomegaly, splenomegaly or mass.     Tenderness: There is no abdominal tenderness.  Musculoskeletal:     Right lower leg: No edema.     Left lower leg: No edema.  Lymphadenopathy:     Cervical: No cervical adenopathy.     Right cervical: No superficial, deep or posterior cervical adenopathy.    Left cervical: No superficial, deep or posterior cervical adenopathy.     Upper Body:     Right upper body: No supraclavicular or axillary adenopathy.     Left upper body: No supraclavicular or axillary adenopathy.  Neurological:     General: No focal deficit present.     Mental Status: She is alert and oriented to person, place, and time.  Psychiatric:        Mood and Affect: Mood normal.        Behavior: Behavior normal.     LABS:      Latest Ref Rng & Units 05/25/2023    2:00 PM 05/19/2022    2:59 PM 09/12/2021    5:22 PM  CBC  WBC 4.0 - 10.5 K/uL 5.1  4.3  9.6   Hemoglobin 12.0 - 15.0 g/dL 47.8  29.5   62.1   Hematocrit 36.0 - 46.0 % 40.3  38.2  42.7   Platelets 150 - 400 K/uL 209  210  229       Latest Ref Rng & Units 05/25/2023    2:00 PM 05/16/2023   12:40 PM 09/27/2022    3:38 PM  CMP  Glucose 70 - 99 mg/dL 99   308   BUN 8 - 23 mg/dL 12   8   Creatinine 6.57 - 1.00 mg/dL 8.46   9.62   Sodium 952 - 145 mmol/L 138   140   Potassium 3.5 - 5.1 mmol/L 3.5   3.8   Chloride 98 - 111 mmol/L 102   100   CO2 22 - 32 mmol/L 28   25   Calcium 8.9 - 10.3 mg/dL 9.4   9.8   Total Protein 6.5 - 8.1 g/dL 7.6   7.4   Total Bilirubin <1.2 mg/dL 0.6   0.6   Alkaline Phos  38 - 126 U/L 43   59   AST 15 - 41 U/L 29   19   ALT 0 - 44 U/L 36  29  18      No results found for: "CEA1", "CEA" / No results found for: "CEA1", "CEA" No results found for: "PSA1" No results found for: "ZOX096" No results found for: "CAN125"  Lab Results  Component Value Date   TOTALPROTELP 7.3 05/25/2023   ALBUMINELP 4.3 05/25/2023   A1GS 0.2 05/25/2023   A2GS 0.8 05/25/2023   BETS 0.9 05/25/2023   GAMS 1.2 05/25/2023   MSPIKE Not Observed 05/25/2023   SPEI Comment 05/25/2023   No results found for: "TIBC", "FERRITIN", "IRONPCTSAT" Lab Results  Component Value Date   LDH 120 05/19/2022   LDH 147 05/12/2021   LDH 141 04/30/2020     STUDIES:   No results found.

## 2023-06-01 NOTE — Patient Instructions (Addendum)
[facility] at [facility] Discharge Instructions   You were seen and examined today by Dr. Ellin Saba.  He reviewed the results of your lab work which are normal/stable.   We will see you back in 1 year. We will repeat lab work prior to your next visit. You also need to get a skeletal survey to check your bones for lesions prior to your next visit.  Return as scheduled.    Thank you for choosing [facility] at [facility] to provide your oncology and hematology care.  To afford each patient quality time with our provider, please arrive at least 15 minutes before your scheduled appointment time.   If you have a lab appointment with the Cancer Center please come in thru the Main Entrance and check in at the main information desk.  You need to re-schedule your appointment should you arrive 10 or more minutes late.  We strive to give you quality time with our providers, and arriving late affects you and other patients whose appointments are after yours.  Also, if you no show three or more times for appointments you may be dismissed from the clinic at the providers discretion.     Again, thank you for choosing [facility].  Our hope is that these requests will decrease the amount of time that you wait before being seen by our physicians.       _____________________________________________________________  Should you have questions after your visit to [facility], please contact our office at [phone] and follow the prompts.  Our office hours are 8:00 a.m. and 4:30 p.m. Monday - Friday.  Please note that voicemails left after 4:00 p.m. may not be returned until the following business day.  We are closed weekends and major holidays.  You do have access to a nurse 24-7, just call the main number to the clinic [phone] and do not press any options, hold on the line and a nurse will answer the phone.    For prescription  refill requests, have your pharmacy contact our office and allow 72 hours.    Due to Covid, you will need to wear a mask upon entering the hospital. If you do not have a mask, a mask will be given to you at the Main Entrance upon arrival. For doctor visits, patients may have 1 support person age 61 or older with them. For treatment visits, patients can not have anyone with them due to social distancing guidelines and our immunocompromised population.

## 2023-06-01 NOTE — Telephone Encounter (Signed)
Reports small painless "bump" at her rectum that will bleed at times. Infrequently it feels tender. She is concerned about this because she cannot see it and does not know what it is.  She also notes that she is have upper GI discomfort. She is taking Mylanta for this symptom "more than 2 times a week." She does get relief. She is concerned about the frequent use and why she is having this new problem. Patient wants to be seen and have these concerns addressed.

## 2023-06-02 LAB — IMMUNOFIXATION ELECTROPHORESIS
IgA: 162 mg/dL (ref 64–422)
IgG (Immunoglobin G), Serum: 1275 mg/dL (ref 586–1602)
IgM (Immunoglobulin M), Srm: 40 mg/dL (ref 26–217)
Total Protein ELP: 7 g/dL (ref 6.0–8.5)

## 2023-06-07 ENCOUNTER — Other Ambulatory Visit: Payer: Self-pay

## 2023-06-07 DIAGNOSIS — F418 Other specified anxiety disorders: Secondary | ICD-10-CM

## 2023-06-07 NOTE — Telephone Encounter (Signed)
Referral placed.

## 2023-06-09 ENCOUNTER — Ambulatory Visit: Payer: Medicare Other | Admitting: Gastroenterology

## 2023-06-19 ENCOUNTER — Other Ambulatory Visit: Payer: Self-pay | Admitting: Family Medicine

## 2023-06-20 ENCOUNTER — Encounter: Payer: Self-pay | Admitting: Family Medicine

## 2023-06-20 ENCOUNTER — Ambulatory Visit (INDEPENDENT_AMBULATORY_CARE_PROVIDER_SITE_OTHER): Payer: Medicare Other | Admitting: Family Medicine

## 2023-06-20 VITALS — BP 123/76 | HR 67 | Resp 16 | Ht 67.0 in | Wt 161.0 lb

## 2023-06-20 DIAGNOSIS — Z59819 Housing instability, housed unspecified: Secondary | ICD-10-CM

## 2023-06-20 DIAGNOSIS — E559 Vitamin D deficiency, unspecified: Secondary | ICD-10-CM | POA: Diagnosis not present

## 2023-06-20 DIAGNOSIS — E785 Hyperlipidemia, unspecified: Secondary | ICD-10-CM

## 2023-06-20 DIAGNOSIS — F418 Other specified anxiety disorders: Secondary | ICD-10-CM

## 2023-06-20 DIAGNOSIS — I10 Essential (primary) hypertension: Secondary | ICD-10-CM

## 2023-06-20 DIAGNOSIS — Z5986 Financial insecurity: Secondary | ICD-10-CM

## 2023-06-20 NOTE — Assessment & Plan Note (Signed)
Still working on getting housing , has appointment in 2 days for housing in [place], hopefully housing will be available for her in the next month

## 2023-06-20 NOTE — Assessment & Plan Note (Signed)
 Controlled, no change in medication DASH diet and commitment to daily physical activity for a minimum of 30 minutes discussed and encouraged, as a part of hypertension management. The importance of attaining a healthy weight is also discussed.     06/20/2023    3:12 PM 06/01/2023    2:43 PM 03/29/2023    3:05 PM 02/17/2023    3:15 PM 01/16/2023    1:54 PM 12/27/2022    3:40 PM 12/27/2022    3:14 PM  BP/Weight  Systolic BP 123 153 126 122 -- 110 121  Diastolic BP 76 76 70 70 -- 70 75  Wt. (Lbs) 161 158.2 158.12 157 163    BMI 25.22 kg/m2 24.78 kg/m2 24.77 kg/m2 24.59 kg/m2 25.53 kg/m2

## 2023-06-20 NOTE — Patient Instructions (Addendum)
 F/U in 5 months, call if you need me before  Nurse please send for recent mammogram ( 2024)  Fasting  lipid, cmp and EGFr, TSH and vit D 1 week before May folllow up  Hoping that you get housing soon, do not give up, keep working at [employer] that you are utilizing your spiritual needs to help with stress you are going through  No medication changes  Lungs are clear on exam, no need to get CXr  Control allergy symptoms as you are doing   All the best for 2025  Thanks for choosing [facility], we consider it a privelige to serve you.

## 2023-06-20 NOTE — Assessment & Plan Note (Signed)
 Hyperlipidemia:Low fat diet discussed and encouraged.   Lipid Panel  Lab Results  Component Value Date   CHOL 142 05/16/2023   HDL 74 05/16/2023   LDLCALC 57 05/16/2023   TRIG 48 05/16/2023   CHOLHDL 1.9 05/16/2023     Controlled, no change in medication

## 2023-06-21 ENCOUNTER — Encounter: Payer: Self-pay | Admitting: Family Medicine

## 2023-06-21 NOTE — Progress Notes (Signed)
 Tiffany Mendoza     MRN: [id number]      DOB: 1951/06/05  Chief Complaint  Patient presents with   Bloated    Appetite had been off when she made appt, she woke up and felt quesy so she started back on mylanta and eating same time everyday it helped.    HPI Ms. Jennings is here for follow up and re-evaluation of chronic medical conditions, medication management and review of any available recent lab and radiology data.  Preventive health is updated, specifically  Cancer screening and Immunization.   Questions or concerns regarding consultations or procedures which the PT has had in the interim are  addressed. The PT denies any adverse reactions to current medications since the last visit.  Ongoing challenge for housing continues, hopeful that she is near to getting a place to call her own Recent stomach complaints have settled down since  she has started eating more regularly , feels GI issues are linked to her stress and rightly so   ROS Denies recent fever or chills. Denies sinus pressure, nasal congestion, ear pain or sore throat. Denies chest congestion, productive cough or wheezing. Denies chest pains, palpitations and leg swelling  Denies dysuria, frequency, hesitancy or incontinence. Denies joint pain, swelling and limitation in mobility. Denies headaches, seizures, numbness, or tingling. Denies uncontrolled depression, anxiety or insomnia. Denies skin break down or rash.   PE  BP 123/76   Pulse 67   Resp 16   Ht 5' 7 (1.702 m)   Wt 161 lb (73 kg)   SpO2 95%   BMI 25.22 kg/m   Patient alert and oriented and in no cardiopulmonary distress.  HEENT: No facial asymmetry, EOMI,     Neck supple .  Chest: Clear to auscultation bilaterally.  CVS: S1, S2 no murmurs, no S3.Regular rate.  ABD: Soft non tender.   Ext: No edema  MS: Adequate ROM spine, shoulders, hips and knees.  Skin: Intact, no ulcerations or rash noted.  Psych: Good eye contact, normal affect.  Memory intact not anxious or depressed appearing.  CNS: CN 2-12 intact, power,  normal throughout.no focal deficits noted.   Assessment & Plan Housing insecurity Still working on getting housing , has appointment in 2 days for housing in [place], hopefully housing will be available for her in the next month  Essential hypertension Controlled, no change in medication DASH diet and commitment to daily physical activity for a minimum of 30 minutes discussed and encouraged, as a part of hypertension management. The importance of attaining a healthy weight is also discussed.     06/20/2023    3:12 PM 06/01/2023    2:43 PM 03/29/2023    3:05 PM 02/17/2023    3:15 PM 01/16/2023    1:54 PM 12/27/2022    3:40 PM 12/27/2022    3:14 PM  BP/Weight  Systolic BP 123 153 126 122 -- 110 121  Diastolic BP 76 76 70 70 -- 70 75  Wt. (Lbs) 161 158.2 158.12 157 163    BMI 25.22 kg/m2 24.78 kg/m2 24.77 kg/m2 24.59 kg/m2 25.53 kg/m2         Dyslipidemia Hyperlipidemia:Low fat diet discussed and encouraged.   Lipid Panel  Lab Results  Component Value Date   CHOL 142 05/16/2023   HDL 74 05/16/2023   LDLCALC 57 05/16/2023   TRIG 48 05/16/2023   CHOLHDL 1.9 05/16/2023     Controlled, no change in medication   Vitamin D  deficiency Updated  lab needed at/ before next visit.   Mixed anxiety and depressive disorder Improved and controlled on current meds, continue same  Financial insecurity Reports getting assistance from nephew

## 2023-06-21 NOTE — Assessment & Plan Note (Signed)
 Updated lab needed at/ before next visit.

## 2023-06-21 NOTE — Assessment & Plan Note (Signed)
Improved and controlled on current meds , continue same 

## 2023-06-21 NOTE — Assessment & Plan Note (Signed)
 Reports getting assistance from nephew

## 2023-06-30 ENCOUNTER — Ambulatory Visit: Payer: Self-pay | Admitting: *Deleted

## 2023-06-30 NOTE — Patient Instructions (Signed)
 Visit Information  Thank you for taking time to visit with me today. Please don't hesitate to contact me if I can be of assistance to you.   Following are the goals we discussed today:   Goals Addressed             This Visit's Progress    Housing, Manage anxiety/depression & hypertension/hyperlipidemia-nurse care coordination service   On track    Interventions Today    Flowsheet Row Most Recent Value  Chronic Disease   Chronic disease during today's visit Hypertension (HTN), Other  [housing, depression]  General Interventions   General Interventions Discussed/Reviewed General Interventions Reviewed, [facility], Doctor Visits  Doctor Visits Discussed/Reviewed Doctor Visits Reviewed, PCP  PCP/Specialist Visits Compliance with follow-up visit  Education Interventions   Education Provided Provided Education  [benefits of keeping active]  Provided Verbal Education On Mental Health/Coping with Illness, Community Resources  Mental Health Interventions   Mental Health Discussed/Reviewed Mental Health Reviewed, Depression, Coping Strategies  Nutrition Interventions   Nutrition Discussed/Reviewed Nutrition Reviewed, Adding fruits and vegetables, Fluid intake  Pharmacy Interventions   Pharmacy Dicussed/Reviewed Pharmacy Topics Reviewed, Medications and their functions              Our next appointment is by telephone on 08/28/23 at 3:30 pm  Please call the care guide team at [phone] if you need to cancel or reschedule your appointment.   If you are experiencing a Mental Health or Behavioral Health Crisis or need someone to talk to, please call the Suicide and Crisis Lifeline: 988 call the [place]  National Suicide Prevention Lifeline: 215-396-5843 or TTY: 607-237-7470 TTY (878)448-6024) to talk to a trained counselor call 1-800-273-TALK (toll free, 24 hour hotline) go to [facility] [address] [place] [phone]) call  the [facility] Crisis Line: ([phone]) [phone] call 911   Patient verbalizes understanding of instructions and care plan provided today and agrees to view in MyChart. Active MyChart status and patient understanding of how to access instructions and care plan via MyChart confirmed with patient.     The patient has been provided with contact information for the care management team and has been advised to call with any health related questions or concerns.   Raphaela Cannaday L. Ramonita, RN, BSN, [facility]  VBCI Care Management Coordinator  [phone]  Fax: [phone]

## 2023-06-30 NOTE — Patient Outreach (Signed)
  Care Coordination   Follow Up Visit Note   06/30/2023 Name: Tiffany Mendoza MRN: [id number] DOB: 08-Apr-1951  Tiffany Mendoza is a 73 y.o. year old female who sees Tiffany Mendoza, Tiffany BRAVO, MD for primary care. I spoke with  Tiffany Mendoza by phone today.  What matters to the patients health and wellness today?  Patient discussed a show she is viewing and her family heritage on initiation of the outreach, housing, depression, hypertension  Saw pcp 06/21/23 for her appetite weight maintaining from 157-163 lbs on 06/20/23 was 161 lbs Eating better  Depression/Social concerns- still spending time with high school friends on the third Wednesdays to help get out of the home and for socialization. She has attended thirteen events starting in 2024. This has made her less depressed. She went to one of her sister's for the christmas holiday and enjoyed herself. She did outreach to [place] but had to leave a message   Housing Still working on getting new housing She went on 06/22/23 to the housing authority after issues with verifying her employment per the [employer] system program.  Also completed an application for [place] apartments but there has not been a response.  She has decided not continue with the [facility] [employer] application process related to concerns with the office staff not being able to follow [employer] school employment + discouraging interaction with the patent examiner.  Got on the [facility]  She shared her thoughts about her interactions with each housing department She is having her friends help with finding a place to stay- receiving assistance Her nephew in [place]  is also helping  She has decided to work and find any safe housing available in [place]  Hypertension is being managed SBP 120- 150's   Goals Addressed             This Visit's Progress    Housing, Manage anxiety/depression &  hypertension/hyperlipidemia-nurse care coordination service   On track    Interventions Today    Flowsheet Row Most Recent Value  Chronic Disease   Chronic disease during today's visit Hypertension (HTN), Other  [housing, depression]  General Interventions   General Interventions Discussed/Reviewed General Interventions Reviewed, [facility], Doctor Visits  Doctor Visits Discussed/Reviewed Doctor Visits Reviewed, PCP  PCP/Specialist Visits Compliance with follow-up visit  Education Interventions   Education Provided Provided Education  [benefits of keeping active]  Provided Verbal Education On Mental Health/Coping with Illness, Community Resources  Mental Health Interventions   Mental Health Discussed/Reviewed Mental Health Reviewed, Depression, Coping Strategies  Nutrition Interventions   Nutrition Discussed/Reviewed Nutrition Reviewed, Adding fruits and vegetables, Fluid intake  Pharmacy Interventions   Pharmacy Dicussed/Reviewed Pharmacy Topics Reviewed, Medications and their functions              SDOH assessments and interventions completed:  No     Care Coordination Interventions:  Yes, provided   Follow up plan: Follow up call scheduled for 08/28/23    Encounter Outcome:  Patient Visit Completed   Suzen L. Ramonita, RN, BSN, [facility]  VBCI Care Management Coordinator  [phone]  Fax: [phone]

## 2023-07-20 ENCOUNTER — Other Ambulatory Visit: Payer: Self-pay | Admitting: Family Medicine

## 2023-07-21 ENCOUNTER — Ambulatory Visit (INDEPENDENT_AMBULATORY_CARE_PROVIDER_SITE_OTHER): Payer: Medicare Other | Admitting: Professional Counselor

## 2023-07-21 DIAGNOSIS — F411 Generalized anxiety disorder: Secondary | ICD-10-CM | POA: Diagnosis not present

## 2023-07-21 NOTE — Patient Instructions (Signed)
 If your symptoms worsen or you have thoughts of suicide/homicide, PLEASE SEEK IMMEDIATE MEDICAL ATTENTION.  You may always call:   National Suicide Hotline: 988 or 917-531-6113 [place] Crisis Line: 636-643-9342 Crisis Recovery in [place]: [phone]     These are available 24 hours a day, 7 days a week.

## 2023-07-21 NOTE — BH Specialist Note (Unsigned)
Collaborative Care Initial Assessment  Session Start time: 2:30 pm   Session End time: 3:30 pm  Total time in minutes: 60 min   Type of Contact:  Face to Face Patient consent obtained:  Yes Types of Service: Collaborative care  Summary  Patient is a 73 yo female being referred to collaborative care by her pcp for anxiety and depression. Patient was engaged and cooperative during session.   Reason for referral in patient/family's own words:  "A lot of things piled up.  Patient's goal for today's visit: "Just need someone to talk to"  History of Present illness:   The patient is a 73 year old female with a history of depression who presented for a collaborative care follow-up. Her primary concern today is anxiety and feeling unstable in her current situation. She reports that she may lose her home within the next month and has no alternative housing options. She has been living in her mothers house since her passing, but ownership was left to her sister. This ongoing stress has significantly impacted her day to day. While her PHQ-9 and GAD-7 scores are both moderate at 5, she describes excessive worry and fear about her future.  Additionally, she acknowledges unresolved grief from her mothers passing in 2023. She explains that after marrying in 2006, she had planned to buy a house, but when that fell through and her marriage ended, she moved in with her mother and became her primary caretaker. She admits that during this time, she prioritized her mothers care over her own well-being, leading to personal neglect. This also created strain with her siblings, who were not as involved, contributing to feelings of resentment, guilt, and remorse over whether she could have done more for her mother.  Despite her struggles, she has a supportive social network, including friends from church and a small group that meets regularly for social outings. She demonstrates functional capacity but is seeking  counseling to process built-up emotions and unresolved trauma. She denies a history of psychiatric hospitalizations, suicide attempts, substance abuse, or bipolar disorder, and she does not report any trauma history. While she denies suicidal ideation, she has had fleeting thoughts questioning whether she is better off "here or not", but she emphasizes that these thoughts are momentary, with no intent or plan to harm herself.  She is currently taking Zoloft 50 mg, which she feels has been helpful, but she has been on it for a long time. She is seeking counseling for additional support in coping with her emotions. A psychiatric consultation will be conducted to determine if she is appropriate for collaborative care or if a referral for specialized therapy is necessary.  Clinical Assessment   PHQ-9 Assessments:    07/21/2023    2:53 PM 06/20/2023    3:35 PM 03/29/2023    3:15 PM 01/16/2023    1:58 PM 12/27/2022    3:14 PM  Depression screen PHQ 2/9  Decreased Interest 1 1 2 1 1   Down, Depressed, Hopeless 1 1 1 2 1   PHQ - 2 Score 2 2 3 3 2   Altered sleeping 1 1 1 1 2   Tired, decreased energy 1 1 1  0 1  Change in appetite 0 1 1 0 1  Feeling bad or failure about yourself  0 0 0 0 1  Trouble concentrating 1 1 0 2 1  Moving slowly or fidgety/restless 0 0 0 0 0  Suicidal thoughts 0 0 0 0 0  PHQ-9 Score 5 6 6 6  8  Difficult doing work/chores Somewhat difficult Not difficult at all Somewhat difficult Very difficult     GAD-7 Assessments:    07/21/2023    2:54 PM 03/29/2023    3:15 PM 12/27/2022    3:14 PM 10/25/2022    3:00 PM  GAD 7 : Generalized Anxiety Score  Nervous, Anxious, on Edge 1 1 1 1   Control/stop worrying 1 0 1 1  Worry too much - different things 1 0 1 1  Trouble relaxing 1 0 1 1  Restless 0 0 0 0  Easily annoyed or irritable 0 0 0 0  Afraid - awful might happen 1 1 1 1   Total GAD 7 Score 5 2 5 5   Anxiety Difficulty Not difficult at all Not difficult at all  Somewhat difficult      Social History:  Household: Lives alone Marital status: Divorced Number of Children:  Employment: Lawyer Education: Some college  Psychiatric Review of systems: Insomnia: Denies Changes in appetite: No Decreased need for sleep: No Family history of bipolar disorder: No Hallucinations: No   Paranoia: No    Psychotropic medications: Current medications: Zoloft 50 mg Patient taking medications as prescribed:  Yes Side effects reported: no  Current medications (medication list) Current Outpatient Medications on File Prior to Visit  Medication Sig Dispense Refill   aluminum-magnesium hydroxide 200-200 MG/5ML suspension Take 10 mLs by mouth daily. prn     Apoaequorin (PREVAGEN EXTRA STRENGTH PO) Take 1 each by mouth daily.     Ascorbic Acid (VITAMIN C) 1000 MG tablet Take 1,000 mg by mouth daily.     b complex vitamins capsule Take 1 capsule by mouth daily.     Cholecalciferol (VITAMIN D3 PO) Take 5,000 Units by mouth daily.     clorazepate (TRANXENE-T) 7.5 MG tablet Take 1 tablet (7.5 mg total) by mouth 2 (two) times daily as needed for anxiety. 60 tablet 5   Garlic 100 MG TABS Take by mouth.     hydrochlorothiazide (HYDRODIURIL) 25 MG tablet Take 1 tablet by mouth once daily 90 tablet 0   MAGNESIUM GLYCINATE ADVANCED PO Take 240 capsules by mouth daily.     Omega-3 Fatty Acids (FISH OIL) 1000 MG CAPS Take by mouth.     Polyethylene Glycol 3350 (MIRALAX PO) Take by mouth daily as needed.     Potassium 99 MG TABS Take by mouth daily.     sertraline (ZOLOFT) 50 MG tablet TAKE 1 & 1/2 (ONE & ONE-HALF) TABLETS BY MOUTH ONCE DAILY 45 tablet 0   Wheat Dextrin (BENEFIBER DRINK MIX) PACK      No current facility-administered medications on file prior to visit.    Psychiatric History: Past psychiatry diagnosis: Depression, anxiety Patient currently being seen by therapist/psychiatrist: No Prior Suicide Attempts: No Past psychiatry Hospitalization(s): No Past history  of violence: No  Traumatic Experiences: History or current traumatic events (natural disaster, house fire, etc.)? no History or current physical trauma?  no History or current emotional trauma?  yes History or current sexual trauma?  no History or current domestic or intimate partner violence?  no PTSD symptoms if any traumatic experiences no  Alcohol and/or Substance Use History   Tobacco Alcohol Other substances  Current use None None None  Past use Never Never Never  Past treatment      Withdrawal Potential: None  Self-harm Behaviors Risk Assessment Self-harm risk factors:  Aging, depression, anxiety, pass thoughts of "if I better off here or not here" Patient endorses recent  thoughts of harming self: Denies  Guns in the home: No   Protective factors: "I have faith and god, friends, family"  Danger to Others Risk Assessment Danger to others risk factors:  None Patient endorses recent thoughts of harming others: Denies    Consulting civil engineer discussed emergency crisis plan with client and provided local emergency services resources.  Mental status exam:   General Appearance Tiffany Mendoza:  Casual Eye Contact:  Good Motor Behavior:  Normal Speech:  Normal Level of Consciousness:  Alert Mood:  Anxious Affect:  Appropriate Anxiety Level:  Minimal Thought Process:  Coherent Thought Content:  WNL Perception:  Normal Judgment:  Good Insight:  Present  Diagnosis: GAD (generalized anxiety disorder)   Goals: Increase healthy adjustment to current life circumstances   Interventions: Mindfulness or Relaxation Training, Behavioral Activation, and CBT Cognitive Behavioral Therapy   Follow-up Plan:

## 2023-07-27 ENCOUNTER — Telehealth: Payer: Self-pay | Admitting: Professional Counselor

## 2023-07-27 DIAGNOSIS — F411 Generalized anxiety disorder: Secondary | ICD-10-CM

## 2023-07-27 NOTE — BH Specialist Note (Signed)
 Virtual Behavioral Health Treatment Plan Team Note  MRN: [id number] NAME: Tiffany Mendoza  DATE: 07/27/23  Start time: Start Time: 0954 End time: Stop Time: 1007 Total time: Total Time in Minutes (Visit): 13  Total number of Virtual BH Treatment Team Plan encounters: 1/4  Treatment Team Attendees: Dr. Jenniffer, Dr. Juleen, and Redell Corn  Collaborative Care Psychiatric Consultant Case Review    Assessment/Provisional Diagnosis Tiffany Mendoza is a 73 y.o. year old female with history of depression and anxiety. The patient is referred for anxiety and depression   # patient reporting symptoms of GAD, and MDD. She is dealing with a lot of unresolved grief. Currently taking Tranxene  since February 2023. Giving patient age and risks of using benzos, I recommend referral to psychiatry for ongoing management of her medications and to de prescribe her Benzos. In the meanwhile We can increase Her zoloft  to 100 mg daily. Giving her reported symptoms and poor coping skills I suspect she would need an augmenting agent to zoloft  as well in the long term. Is would also benefit from combining this with psychotherapy   Recommendation Increase Zoloft  to 100 mg daily Refer to psychiatry Refer to therapy [facility] specialist to follow up.      Diagnoses:    ICD-10-CM   1. GAD (generalized anxiety disorder)  F41.1       Goals, Interventions and Follow-up Plan Goals: Increase healthy adjustment to current life circumstances Interventions: Mindfulness or Relaxation Training Behavioral Activation CBT Cognitive Behavioral Therapy Medication Management Recommendations: Increase zoloft  to 100 then defer to psych Follow-up Plan: Refer to psychiatry to taper off benzodipizine. Offer traditional psychotherapy as well.   History of the present illness Presenting Problem/Current Symptoms:  The patient is a 73 year old female with a history of depression who presented for a collaborative care follow-up.  Her primary concern today is anxiety and feeling unstable in her current situation. She reports that she may lose her home within the next month and has no alternative housing options. She has been living in her mothers house since her passing, but ownership was left to her sister. This ongoing stress has significantly impacted her day to day. While her PHQ-9 and GAD-7 scores are both moderate at 5, she describes excessive worry and fear about her future.   Additionally, she acknowledges unresolved grief from her mothers passing in 2023. She explains that after marrying in 2006, she had planned to buy a house, but when that fell through and her marriage ended, she moved in with her mother and became her primary caretaker. She admits that during this time, she prioritized her mothers care over her own well-being, leading to personal neglect. This also created strain with her siblings, who were not as involved, contributing to feelings of resentment, guilt, and remorse over whether she could have done more for her mother.   Despite her struggles, she has a supportive social network, including friends from church and a small group that meets regularly for social outings. She demonstrates functional capacity but is seeking counseling to process built-up emotions and unresolved trauma. She denies a history of psychiatric hospitalizations, suicide attempts, substance abuse, or bipolar disorder, and she does not report any trauma history. While she denies suicidal ideation, she has had fleeting thoughts questioning whether she is better off here or not, but she emphasizes that these thoughts are momentary, with no intent or plan to harm herself.   She is currently taking Zoloft  50 mg, which she feels has been helpful,  but she has been on it for a long time. She is seeking counseling for additional support in coping with her emotions. A psychiatric consultation will be conducted to determine if she is appropriate  for collaborative care or if a referral for specialized therapy is necessary.    Psychosocial stressors Flowsheet Row Virtual BH Phone Follow Up from 12/11/2017 in [facility]  Current Stressors --  [place] caretaker for her mother that has been diagnosed with Dementia]  Familial Stressors None  Appetite Decreased  Coping ability Normal  Patient taking medications as prescribed Yes       Self-harm Behaviors Risk Assessment Flowsheet Row Virtual BH Phone Follow Up from 12/11/2017 in [facility]  Self-harm risk factors --  [None Reported]  Have you recently had any thoughts about harming yourself? No       Screenings PHQ-9 Assessments:     07/21/2023    2:53 PM 06/20/2023    3:35 PM 03/29/2023    3:15 PM  Depression screen PHQ 2/9  Decreased Interest 1 1 2   Down, Depressed, Hopeless 1 1 1   PHQ - 2 Score 2 2 3   Altered sleeping 1 1 1   Tired, decreased energy 1 1 1   Change in appetite 0 1 1  Feeling bad or failure about yourself  0 0 0  Trouble concentrating 1 1 0  Moving slowly or fidgety/restless 0 0 0  Suicidal thoughts 0 0 0  PHQ-9 Score 5 6 6   Difficult doing work/chores Somewhat difficult Not difficult at all Somewhat difficult   GAD-7 Assessments:     07/21/2023    2:54 PM 03/29/2023    3:15 PM 12/27/2022    3:14 PM 10/25/2022    3:00 PM  GAD 7 : Generalized Anxiety Score  Nervous, Anxious, on Edge 1 1 1 1   Control/stop worrying 1 0 1 1  Worry too much - different things 1 0 1 1  Trouble relaxing 1 0 1 1  Restless 0 0 0 0  Easily annoyed or irritable 0 0 0 0  Afraid - awful might happen 1 1 1 1   Total GAD 7 Score 5 2 5 5   Anxiety Difficulty Not difficult at all Not difficult at all  Somewhat difficult    Past Medical History Past Medical History:  Diagnosis Date   Adjustment disorder with mixed anxiety and depressed mood    Allergy    Carotid bruit    Depression    Dermatophytosis of foot    GERD  (gastroesophageal reflux disease)    Hypertension    Insomnia    Neck pain    Osteoporosis     Vital signs: There were no vitals filed for this visit.  Allergies:  Allergies as of 07/27/2023   (No Known Allergies)    Medication History Current medications:  Outpatient Encounter Medications as of 07/27/2023  Medication Sig   aluminum-magnesium hydroxide 200-200 MG/5ML suspension Take 10 mLs by mouth daily. prn   Apoaequorin (PREVAGEN EXTRA STRENGTH PO) Take 1 each by mouth daily.   Ascorbic Acid (VITAMIN C) 1000 MG tablet Take 1,000 mg by mouth daily.   b complex vitamins capsule Take 1 capsule by mouth daily.   Cholecalciferol (VITAMIN D3 PO) Take 5,000 Units by mouth daily.   clorazepate  (TRANXENE -T) 7.5 MG tablet Take 1 tablet (7.5 mg total) by mouth 2 (two) times daily as needed for anxiety.   Garlic 100 MG TABS Take by mouth.  hydrochlorothiazide  (HYDRODIURIL ) 25 MG tablet Take 1 tablet by mouth once daily   MAGNESIUM GLYCINATE ADVANCED PO Take 240 capsules by mouth daily.   Omega-3 Fatty Acids (FISH OIL) 1000 MG CAPS Take by mouth.   Polyethylene Glycol 3350 (MIRALAX PO) Take by mouth daily as needed.   Potassium 99 MG TABS Take by mouth daily.   sertraline  (ZOLOFT ) 50 MG tablet TAKE 1 & 1/2 (ONE & ONE-HALF) TABLETS BY MOUTH ONCE DAILY   Wheat Dextrin (BENEFIBER DRINK MIX) PACK    No facility-administered encounter medications on file as of 07/27/2023.     Scribe for Treatment Team: Redell JINNY Corn

## 2023-07-29 ENCOUNTER — Other Ambulatory Visit: Payer: Self-pay | Admitting: Family Medicine

## 2023-08-04 ENCOUNTER — Ambulatory Visit: Payer: Medicare Other | Admitting: Professional Counselor

## 2023-08-16 ENCOUNTER — Other Ambulatory Visit: Payer: Self-pay | Admitting: Family Medicine

## 2023-08-18 ENCOUNTER — Telehealth: Payer: Self-pay | Admitting: Family Medicine

## 2023-08-18 NOTE — Telephone Encounter (Signed)
 Chat message came across from Dr Kirtland Bouchard office, received chat message from Kennith Gain, RN  patient walked into their office they do not see her for anemia, her symptoms no real reason to check any light chain labs, could her pcp manage this.

## 2023-08-18 NOTE — Telephone Encounter (Signed)
 Active order for labs including cbc already ordered by dr Ellin Saba. Pt advised to see that provider and have labs

## 2023-08-18 NOTE — Telephone Encounter (Signed)
 Patient came by office in the lobby asking can she get blood work check for anemia. Needs an order

## 2023-08-18 NOTE — Telephone Encounter (Signed)
 Active labs for dr simpson from Oct she can have done fasting

## 2023-08-20 ENCOUNTER — Emergency Department (HOSPITAL_COMMUNITY)

## 2023-08-20 ENCOUNTER — Emergency Department (HOSPITAL_COMMUNITY)
Admission: EM | Admit: 2023-08-20 | Discharge: 2023-08-20 | Disposition: A | Discharge status: Home / Self Care | Attending: Emergency Medicine | Admitting: Emergency Medicine

## 2023-08-20 ENCOUNTER — Other Ambulatory Visit: Payer: Self-pay

## 2023-08-20 DIAGNOSIS — R002 Palpitations: Secondary | ICD-10-CM

## 2023-08-20 DIAGNOSIS — Z79899 Other long term (current) drug therapy: Secondary | ICD-10-CM | POA: Insufficient documentation

## 2023-08-20 DIAGNOSIS — J189 Pneumonia, unspecified organism: Secondary | ICD-10-CM | POA: Diagnosis not present

## 2023-08-20 DIAGNOSIS — I10 Essential (primary) hypertension: Secondary | ICD-10-CM | POA: Insufficient documentation

## 2023-08-20 LAB — CBC WITH DIFFERENTIAL/PLATELET
Abs Immature Granulocytes: 0.01 10*3/uL (ref 0.00–0.07)
Basophils Absolute: 0 10*3/uL (ref 0.0–0.1)
Basophils Relative: 0 %
Eosinophils Absolute: 0.3 10*3/uL (ref 0.0–0.5)
Eosinophils Relative: 5 %
HCT: 38.2 % (ref 36.0–46.0)
Hemoglobin: 12.7 g/dL (ref 12.0–15.0)
Immature Granulocytes: 0 %
Lymphocytes Relative: 36 %
Lymphs Abs: 1.8 10*3/uL (ref 0.7–4.0)
MCH: 31.4 pg (ref 26.0–34.0)
MCHC: 33.2 g/dL (ref 30.0–36.0)
MCV: 94.3 fL (ref 80.0–100.0)
Monocytes Absolute: 0.5 10*3/uL (ref 0.1–1.0)
Monocytes Relative: 9 %
Neutro Abs: 2.4 10*3/uL (ref 1.7–7.7)
Neutrophils Relative %: 50 %
Platelets: 223 10*3/uL (ref 150–400)
RBC: 4.05 MIL/uL (ref 3.87–5.11)
RDW: 11.9 % (ref 11.5–15.5)
WBC: 4.9 10*3/uL (ref 4.0–10.5)
nRBC: 0 % (ref 0.0–0.2)

## 2023-08-20 LAB — URINALYSIS, ROUTINE W REFLEX MICROSCOPIC
Bacteria, UA: NONE SEEN
Bilirubin Urine: NEGATIVE
Glucose, UA: NEGATIVE mg/dL
Ketones, ur: NEGATIVE mg/dL
Leukocytes,Ua: NEGATIVE
Nitrite: NEGATIVE
Protein, ur: NEGATIVE mg/dL
Specific Gravity, Urine: 1.005 (ref 1.005–1.030)
pH: 7 (ref 5.0–8.0)

## 2023-08-20 LAB — COMPREHENSIVE METABOLIC PANEL
ALT: 20 U/L (ref 0–44)
AST: 23 U/L (ref 15–41)
Albumin: 3.8 g/dL (ref 3.5–5.0)
Alkaline Phosphatase: 37 U/L — ABNORMAL LOW (ref 38–126)
Anion gap: 10 (ref 5–15)
BUN: 9 mg/dL (ref 8–23)
CO2: 27 mmol/L (ref 22–32)
Calcium: 9.3 mg/dL (ref 8.9–10.3)
Chloride: 100 mmol/L (ref 98–111)
Creatinine, Ser: 0.73 mg/dL (ref 0.44–1.00)
GFR, Estimated: >60 mL/min (ref >60)
Glucose, Bld: 105 mg/dL — ABNORMAL HIGH (ref 70–99)
Potassium: 3.4 mmol/L — ABNORMAL LOW (ref 3.5–5.1)
Sodium: 137 mmol/L (ref 135–145)
Total Bilirubin: 0.8 mg/dL (ref 0.0–1.2)
Total Protein: 6.9 g/dL (ref 6.5–8.1)

## 2023-08-20 LAB — TSH: TSH: 2.169 u[IU]/mL (ref 0.350–4.500)

## 2023-08-20 MED ORDER — DOXYCYCLINE HYCLATE 100 MG PO CAPS: 100.0000 mg | ORAL_CAPSULE | Freq: Two times a day (BID) | ORAL | 0 refills | Status: DC

## 2023-08-20 MED ORDER — IOHEXOL 300 MG/ML  SOLN
75.0000 mL | Freq: Once | INTRAMUSCULAR | Status: AC | PRN
  Administered 2023-08-20: 75 mL via INTRAVENOUS

## 2023-08-20 NOTE — Discharge Instructions (Signed)
Follow-up with your doctor in a week for recheck °

## 2023-08-20 NOTE — ED Triage Notes (Signed)
 Pt endorses palpitations that happened last on thursdsay, Denies SOB,CP, Weakness and Dizziness. Endorses hx of GAD, Takes water pill.

## 2023-08-22 ENCOUNTER — Telehealth: Payer: Self-pay | Admitting: Family Medicine

## 2023-08-22 NOTE — Telephone Encounter (Signed)
 Called LVM to schedule appt.

## 2023-08-22 NOTE — ED Provider Notes (Signed)
 [place] EMERGENCY DEPARTMENT AT [facility] Provider Note   CSN: [id number] Arrival date & time: 08/20/23  0900     History  Chief Complaint  Patient presents with   Palpitations    Pt endorses palpitations that happened last on thursdsay, Denies SOB,CP, Weakness and Dizziness. Endorses hx of GAD, Takes water pill.   Anxiety    Tiffany Mendoza is a 73 y.o. female.  Patient complains of palpitations  The history is provided by the patient. No language interpreter was used.  Palpitations Palpitations quality:  Regular Onset quality:  Insidious Timing:  Intermittent Progression:  Waxing and waning Chronicity:  New Context: anxiety   Relieved by:  Nothing Worsened by:  Nothing Associated symptoms: no back pain, no chest pain and no cough   Anxiety Pertinent negatives include no chest pain, no abdominal pain and no headaches.       Home Medications Prior to Admission medications   Medication Sig Start Date End Date Taking? Authorizing Provider  Cholecalciferol (VITAMIN D-3 PO) Take 1 tablet by mouth daily.   Yes [provider]  clorazepate (TRANXENE-T) 7.5 MG tablet Take 1 tablet (7.5 mg total) by mouth 2 (two) times daily as needed for anxiety. 04/14/23  Yes Kerri Perches, MD  doxycycline (VIBRAMYCIN) 100 MG capsule Take 1 capsule (100 mg total) by mouth 2 (two) times daily. One po bid x 7 days 08/20/23  Yes Bethann Berkshire, MD  hydrochlorothiazide (HYDRODIURIL) 25 MG tablet Take 1 tablet by mouth once daily 07/31/23  Yes Kerri Perches, MD  Omega-3 Fatty Acids (FISH OIL PO) Take 1 capsule by mouth daily.   Yes [provider]  Potassium 99 MG TABS Take 99 mg by mouth daily.   Yes [provider]  sertraline (ZOLOFT) 50 MG tablet TAKE 1 & 1/2 (ONE & ONE-HALF) TABLETS BY MOUTH ONCE DAILY 08/16/23  Yes Kerri Perches, MD      Allergies    Patient has no known allergies.    Review of Systems   Review of Systems   Constitutional:  Negative for appetite change and fatigue.  HENT:  Negative for congestion, ear discharge and sinus pressure.   Eyes:  Negative for discharge.  Respiratory:  Negative for cough.   Cardiovascular:  Positive for palpitations. Negative for chest pain.  Gastrointestinal:  Negative for abdominal pain and diarrhea.  Genitourinary:  Negative for frequency and hematuria.  Musculoskeletal:  Negative for back pain.  Skin:  Negative for rash.  Neurological:  Negative for seizures and headaches.  Psychiatric/Behavioral:  Negative for hallucinations.     Physical Exam Updated Vital Signs BP (!) 140/83   Pulse 69   Temp 98.1 F (36.7 C) (Oral)   Resp 17   Ht 5\' 7"  (1.702 m)   Wt 73 kg   SpO2 (!) 85%   BMI 25.21 kg/m  Physical Exam Vitals and nursing note reviewed.  Constitutional:      Appearance: She is well-developed.  HENT:     Head: Normocephalic.     Mouth/Throat:     Mouth: Mucous membranes are moist.  Eyes:     General: No scleral icterus.    Conjunctiva/sclera: Conjunctivae normal.  Neck:     Thyroid: No thyromegaly.  Cardiovascular:     Rate and Rhythm: Normal rate and regular rhythm.     Heart sounds: No murmur heard.    No friction rub. No gallop.  Pulmonary:     Breath sounds: No  stridor. No wheezing or rales.  Chest:     Chest wall: No tenderness.  Abdominal:     General: There is no distension.     Tenderness: There is no abdominal tenderness. There is no rebound.  Musculoskeletal:        General: Normal range of motion.     Cervical back: Neck supple.  Lymphadenopathy:     Cervical: No cervical adenopathy.  Skin:    Findings: No erythema or rash.  Neurological:     Mental Status: She is alert and oriented to person, place, and time.     Motor: No abnormal muscle tone.     Coordination: Coordination normal.  Psychiatric:        Behavior: Behavior normal.     ED Results / Procedures / Treatments   Labs (all labs ordered are listed,  but only abnormal results are displayed) Labs Reviewed  COMPREHENSIVE METABOLIC PANEL - Abnormal; Notable for the following components:      Result Value   Potassium 3.4 (*)    Glucose, Bld 105 (*)    Alkaline Phosphatase 37 (*)    All other components within normal limits  URINALYSIS, ROUTINE W REFLEX MICROSCOPIC - Abnormal; Notable for the following components:   Color, Urine STRAW (*)    Hgb urine dipstick SMALL (*)    All other components within normal limits  CBC WITH DIFFERENTIAL/PLATELET  TSH    EKG EKG Interpretation Date/Time:  Sunday August 20 2023 09:21:01 EST Ventricular Rate:  76 PR Interval:  147 QRS Duration:  87 QT Interval:  383 QTC Calculation: 431 R Axis:   -40  Text Interpretation: Sinus rhythm Left axis deviation Confirmed by Pricilla Loveless [phone]) on 08/21/2023 8:04:19 AM  Radiology CT Chest W Contrast Result Date: 08/20/2023 CLINICAL DATA:  Palpitations. Possible left basilar infiltrate or nodule on radiographs. EXAM: CT CHEST WITH CONTRAST TECHNIQUE: Multidetector CT imaging of the chest was performed during intravenous contrast administration. RADIATION DOSE REDUCTION: This exam was performed according to the departmental dose-optimization program which includes automated exposure control, adjustment of the mA and/or kV according to patient size and/or use of iterative reconstruction technique. CONTRAST:  75mL OMNIPAQUE IOHEXOL 300 MG/ML  SOLN COMPARISON:  Chest radiographs same date and 09/12/2021. Cardiac CT 10/11/2021 FINDINGS: Cardiovascular: No acute vascular findings. Mild atherosclerosis of the aorta, great vessels and coronary arteries. The heart size is normal. There is no pericardial effusion. Mediastinum/Nodes: There are no enlarged mediastinal, hilar or axillary lymph nodes.1.4 cm hypoattenuating right thyroid nodule on image 14/2. No followup recommended.(Ref: J Am Coll Radiol. 2015 Feb;12(2): 143-50). The esophagus and trachea appear unremarkable.  Lungs/Pleura: No pleural effusion or pneumothorax. There is focal ground-glass opacity within the lingula measuring 1.4 x 1.4 cm on image 105/3, accounting for the radiographic finding. This is questionably present on the previous cardiac CT, although has enlarged in the interval. No significant associated solid components. There is no confluent airspace disease or suspicious nodularity elsewhere in the lungs. Mild dependent atelectasis in both lungs. Upper abdomen: No significant findings are seen in the visualized upper abdomen. Musculoskeletal/Chest wall: There is no chest wall mass or suspicious osseous finding. Multilevel degenerative changes within the cervicothoracic spine. IMPRESSION: 1. Focal ground-glass opacity within the lingula, accounting for the radiographic finding. This is questionably present on the previous cardiac CT, although has enlarged in the interval. No significant associated solid components. This finding is indeterminate in etiology and could reflect an area of focal inflammation, postinflammatory scarring or  an adenocarcinoma precursor. Per Fleischner Society Guidelines, recommend a non-contrast Chest CT at 6 months to confirm persistence, then additional non-contrast Chest CTs every 2 years until 5 years. If nodule grows or develops solid component(s), consider resection. These guidelines do not apply to immunocompromised patients and patients with cancer. Follow up in patients with significant comorbidities as clinically warranted. For lung cancer screening, adhere to Lung-RADS guidelines. Reference: Radiology. 2017; 284(1):228-43. 2. No other significant findings. 3.  Aortic Atherosclerosis (ICD10-I70.0). Electronically Signed   By: Carey Bullocks M.D.   On: 08/20/2023 14:47    Procedures Procedures    Medications Ordered in ED Medications  iohexol (OMNIPAQUE) 300 MG/ML solution 75 mL (75 mLs Intravenous Contrast Given 08/20/23 1409)    ED Course/ Medical Decision Making/  A&P  This patient presents to the ED for concern of palpitations, this involves an extensive number of treatment options, and is a complaint that carries with it a high risk of complications and morbidity.  The differential diagnosis includes PVCs, atrial fibs, anxiety   Co morbidities that complicate the patient evaluation  Hypertension   Additional history obtained:  Additional history obtained from patient External records from outside source obtained and reviewed including hospital records   Lab Tests:  I Ordered, and personally interpreted labs.  The pertinent results include: CBC and chemistries unremarkable   Imaging Studies ordered:  I ordered imaging studies including chest x-ray I independently visualized and interpreted imaging which showed left lower lobe pneumonia I agree with the radiologist interpretation   Cardiac Monitoring: / EKG:  The patient was maintained on a cardiac monitor.  I personally viewed and interpreted the cardiac monitored which showed an underlying rhythm of: Normal sinus rhythm   Consultations Obtained:  No consultant  Problem List / ED Course / Critical interventions / Medication management  Palpitations and pneumonia I ordered medication including doxycycline Reevaluation of the patient after these medicines showed that the patient stayed the same I have reviewed the patients home medicines and have made adjustments as needed   Social Determinants of Health:  None   Test / Admission - Considered:  None                                Medical Decision Making Amount and/or Complexity of Data Reviewed Labs: ordered. Radiology: ordered.  Risk Prescription drug management.   Patient with palpitations and community-acquired pneumonia.  She is discharged home with doxycycline and will follow-up with PCP        Final Clinical Impression(s) / ED Diagnoses Final diagnoses:  Palpitations  Community acquired pneumonia  of left lower lobe of lung    Rx / DC Orders ED Discharge Orders          Ordered    doxycycline (VIBRAMYCIN) 100 MG capsule  2 times daily        08/20/23 1534              Bethann Berkshire, MD 08/22/23 1032

## 2023-08-22 NOTE — Telephone Encounter (Signed)
 Copied from CRM [id number]. Topic: Appointments - Scheduling Inquiry for Clinic >> Aug 21, 2023  3:00 PM Fredrica W wrote: Reason for CRM: Patient called in stated she was seen at hospital yesterday and needs to follow up appt. She does not want to see a different provider because she only sees Dr [name]. Dr [name] 1st available April 8th. Will add to [facility] pt wanted to know if provider can review notes from ED and see if she needs to be seen before then. Call patient on mobile because she is moving and disconnecting home phone.Also, has appt with [name] Friday.  Thank you

## 2023-08-24 ENCOUNTER — Telehealth: Payer: Self-pay

## 2023-08-24 NOTE — Telephone Encounter (Signed)
 Copied from CRM [id number]. Topic: Appointments - Appointment Cancel/Reschedule >> Aug 23, 2023  5:23 PM Epimenio Foot F wrote: Patient is calling in to cancel an appointment, appointment cancelled. Patient will call back to reschedule

## 2023-08-24 NOTE — Telephone Encounter (Signed)
 Copied from CRM [id number]. Topic: Appointments - Appointment Cancel/Reschedule >> Aug 23, 2023  5:23 PM Tiffany Mendoza wrote: Patient is calling in to cancel an appointment, appointment cancelled. Patient will call back to reschedule

## 2023-08-25 ENCOUNTER — Ambulatory Visit: Payer: Medicare Other | Admitting: Professional Counselor

## 2023-08-27 ENCOUNTER — Emergency Department (HOSPITAL_COMMUNITY)
Admission: EM | Admit: 2023-08-27 | Discharge: 2023-08-27 | Disposition: A | Discharge status: Home / Self Care | Attending: Emergency Medicine | Admitting: Emergency Medicine

## 2023-08-27 ENCOUNTER — Other Ambulatory Visit: Payer: Self-pay

## 2023-08-27 ENCOUNTER — Emergency Department (HOSPITAL_COMMUNITY)

## 2023-08-27 ENCOUNTER — Encounter (HOSPITAL_COMMUNITY): Payer: Self-pay

## 2023-08-27 DIAGNOSIS — R42 Dizziness and giddiness: Secondary | ICD-10-CM | POA: Diagnosis present

## 2023-08-27 DIAGNOSIS — E876 Hypokalemia: Secondary | ICD-10-CM | POA: Diagnosis not present

## 2023-08-27 LAB — URINALYSIS, ROUTINE W REFLEX MICROSCOPIC
Bilirubin Urine: NEGATIVE
Glucose, UA: NEGATIVE mg/dL
Ketones, ur: NEGATIVE mg/dL
Leukocytes,Ua: NEGATIVE
Nitrite: NEGATIVE
Protein, ur: NEGATIVE mg/dL
Specific Gravity, Urine: 1.006 (ref 1.005–1.030)
pH: 7 (ref 5.0–8.0)

## 2023-08-27 LAB — BASIC METABOLIC PANEL
Anion gap: 10 (ref 5–15)
BUN: 14 mg/dL (ref 8–23)
CO2: 27 mmol/L (ref 22–32)
Calcium: 9.3 mg/dL (ref 8.9–10.3)
Chloride: 101 mmol/L (ref 98–111)
Creatinine, Ser: 0.73 mg/dL (ref 0.44–1.00)
GFR, Estimated: >60 mL/min (ref >60)
Glucose, Bld: 105 mg/dL — ABNORMAL HIGH (ref 70–99)
Potassium: 3 mmol/L — ABNORMAL LOW (ref 3.5–5.1)
Sodium: 138 mmol/L (ref 135–145)

## 2023-08-27 LAB — CBC WITH DIFFERENTIAL/PLATELET
Abs Immature Granulocytes: 0.01 10*3/uL (ref 0.00–0.07)
Basophils Absolute: 0 10*3/uL (ref 0.0–0.1)
Basophils Relative: 1 %
Eosinophils Absolute: 0.2 10*3/uL (ref 0.0–0.5)
Eosinophils Relative: 3 %
HCT: 36.6 % (ref 36.0–46.0)
Hemoglobin: 12.3 g/dL (ref 12.0–15.0)
Immature Granulocytes: 0 %
Lymphocytes Relative: 29 %
Lymphs Abs: 1.6 10*3/uL (ref 0.7–4.0)
MCH: 31.8 pg (ref 26.0–34.0)
MCHC: 33.6 g/dL (ref 30.0–36.0)
MCV: 94.6 fL (ref 80.0–100.0)
Monocytes Absolute: 0.4 10*3/uL (ref 0.1–1.0)
Monocytes Relative: 7 %
Neutro Abs: 3.4 10*3/uL (ref 1.7–7.7)
Neutrophils Relative %: 60 %
Platelets: 205 10*3/uL (ref 150–400)
RBC: 3.87 MIL/uL (ref 3.87–5.11)
RDW: 12.2 % (ref 11.5–15.5)
WBC: 5.6 10*3/uL (ref 4.0–10.5)
nRBC: 0 % (ref 0.0–0.2)

## 2023-08-27 MED ORDER — MECLIZINE HCL 12.5 MG PO TABS
12.5000 mg | ORAL_TABLET | Freq: Once | ORAL | Status: AC
  Administered 2023-08-27: 12.5 mg via ORAL
  Filled 2023-08-27: qty 1

## 2023-08-27 MED ORDER — SODIUM CHLORIDE 0.9 % IV BOLUS
500.0000 mL | Freq: Once | INTRAVENOUS | Status: AC
  Administered 2023-08-27: 500 mL via INTRAVENOUS

## 2023-08-27 MED ORDER — MECLIZINE HCL 12.5 MG PO TABS: 12.5000 mg | ORAL_TABLET | Freq: Three times a day (TID) | ORAL | 0 refills | Status: DC | PRN

## 2023-08-27 MED ORDER — POTASSIUM CHLORIDE ER 10 MEQ PO TBCR: 10.0000 meq | EXTENDED_RELEASE_TABLET | Freq: Every day | ORAL | 0 refills | Status: DC

## 2023-08-27 MED ORDER — POTASSIUM CHLORIDE CRYS ER 20 MEQ PO TBCR
40.0000 meq | EXTENDED_RELEASE_TABLET | Freq: Once | ORAL | Status: AC
  Administered 2023-08-27: 40 meq via ORAL
  Filled 2023-08-27: qty 2

## 2023-08-27 NOTE — ED Triage Notes (Signed)
 Pt reports intermittent dizzy spells with position changes x 3 days and pt has had vertigo about 30 years ago.

## 2023-08-27 NOTE — ED Provider Notes (Signed)
 [place] EMERGENCY DEPARTMENT AT [facility] Provider Note   CSN: [id number] Arrival date & time: 08/27/23  1050     History  Chief Complaint  Patient presents with   Dizziness    Tiffany Mendoza is a 73 y.o. female.  She is here with complaint of 3 days of dizziness.  Has not been continuous.  Seems to revolve around when she moves her head.  She feels lightheaded but denies any spinning.  She said she had something like this years ago.  No headache blurry vision numbness weakness no chest pain or shortness of breath.  The history is provided by the patient.  Dizziness Quality:  Lightheadedness Severity:  Moderate Onset quality:  Gradual Duration:  3 days Timing:  Sporadic Progression:  Unchanged Context: head movement   Relieved by:  Nothing Worsened by:  Movement Ineffective treatments:  Being still Associated symptoms: no chest pain, no diarrhea, no headaches, no hearing loss, no nausea, no palpitations, no shortness of breath, no syncope, no vision changes, no vomiting and no weakness        Home Medications Prior to Admission medications   Medication Sig Start Date End Date Taking? Authorizing Provider  Cholecalciferol (VITAMIN D-3 PO) Take 1 tablet by mouth daily.    [provider]  clorazepate (TRANXENE-T) 7.5 MG tablet Take 1 tablet (7.5 mg total) by mouth 2 (two) times daily as needed for anxiety. 04/14/23   Kerri Perches, MD  doxycycline (VIBRAMYCIN) 100 MG capsule Take 1 capsule (100 mg total) by mouth 2 (two) times daily. One po bid x 7 days 08/20/23   Bethann Berkshire, MD  hydrochlorothiazide (HYDRODIURIL) 25 MG tablet Take 1 tablet by mouth once daily 07/31/23   Kerri Perches, MD  Omega-3 Fatty Acids (FISH OIL PO) Take 1 capsule by mouth daily.    [provider]  Potassium 99 MG TABS Take 99 mg by mouth daily.    [provider]  sertraline (ZOLOFT) 50 MG tablet TAKE 1 & 1/2 (ONE & ONE-HALF) TABLETS BY MOUTH  ONCE DAILY 08/16/23   Kerri Perches, MD      Allergies    Patient has no known allergies.    Review of Systems   Review of Systems  HENT:  Negative for hearing loss.   Respiratory:  Negative for shortness of breath.   Cardiovascular:  Negative for chest pain, palpitations and syncope.  Gastrointestinal:  Negative for diarrhea, nausea and vomiting.  Neurological:  Positive for dizziness. Negative for weakness and headaches.    Physical Exam Updated Vital Signs BP (!) 160/106   Pulse 93   Temp (!) 97.3 F (36.3 C) (Temporal)   Resp 16   Ht 5\' 7"  (1.702 m)   Wt 73.6 kg   SpO2 99%   BMI 25.40 kg/m  Physical Exam Vitals and nursing note reviewed.  Constitutional:      General: She is not in acute distress.    Appearance: Normal appearance. She is well-developed.  HENT:     Head: Normocephalic and atraumatic.     Right Ear: Tympanic membrane normal.     Left Ear: Tympanic membrane normal.     Mouth/Throat:     Mouth: Mucous membranes are moist.     Pharynx: Oropharynx is clear.  Eyes:     Conjunctiva/sclera: Conjunctivae normal.     Comments: She has a few beats of horizontal nystagmus  Cardiovascular:     Rate and Rhythm: Normal rate  and regular rhythm.     Heart sounds: No murmur heard. Pulmonary:     Effort: Pulmonary effort is normal. No respiratory distress.     Breath sounds: Normal breath sounds.  Abdominal:     Palpations: Abdomen is soft.     Tenderness: There is no abdominal tenderness.  Musculoskeletal:        General: No deformity.     Cervical back: Neck supple.  Skin:    General: Skin is warm and dry.     Capillary Refill: Capillary refill takes less than 2 seconds.  Neurological:     General: No focal deficit present.     Mental Status: She is alert and oriented to person, place, and time.     Cranial Nerves: No cranial nerve deficit.     Sensory: No sensory deficit.     Motor: No weakness.     ED Results / Procedures / Treatments    Labs (all labs ordered are listed, but only abnormal results are displayed) Labs Reviewed  BASIC METABOLIC PANEL - Abnormal; Notable for the following components:      Result Value   Potassium 3.0 (*)    Glucose, Bld 105 (*)    All other components within normal limits  URINALYSIS, ROUTINE W REFLEX MICROSCOPIC - Abnormal; Notable for the following components:   Color, Urine STRAW (*)    Hgb urine dipstick SMALL (*)    Bacteria, UA RARE (*)    All other components within normal limits  CBC WITH DIFFERENTIAL/PLATELET    EKG EKG Interpretation Date/Time:  Sunday August 27 2023 11:40:09 EDT Ventricular Rate:  73 PR Interval:  151 QRS Duration:  91 QT Interval:  389 QTC Calculation: 429 R Axis:   -25  Text Interpretation: Sinus rhythm Borderline left axis deviation Low voltage, precordial leads RSR' in V1 or V2, right VCD or RVH No significant change since prior 3/25 Confirmed by Meridee Score [phone]) on 08/27/2023 11:49:41 AM  Radiology MR BRAIN WO CONTRAST Result Date: 08/27/2023 CLINICAL DATA:  Neuro deficit, acute, stroke suspected. Unsteady. Dizzy spells with change in position. EXAM: MRI HEAD WITHOUT CONTRAST TECHNIQUE: Multiplanar, multiecho pulse sequences of the brain and surrounding structures were obtained without intravenous contrast. COMPARISON:  CT head without contrast 08/09/2010 FINDINGS: Brain: No acute infarct, hemorrhage, or mass lesion is present. Confluent periventricular and scattered subcortical T2 hyperintensities bilaterally are moderately advanced for age. The ventricles are of normal size. Deep brain nuclei are within normal limits. A remote lacunar infarct is present in the right thalamus. White matter changes extend into the brainstem. The brainstem and cerebellum are otherwise within normal limits. The internal auditory canals are within normal limits. Vascular: Flow is present in the major intracranial arteries. Skull and upper cervical spine: The  craniocervical junction is normal. Upper cervical spine is within normal limits. Marrow signal is unremarkable. Sinuses/Orbits: The paranasal sinuses and mastoid air cells are clear. The globes and orbits are within normal limits. IMPRESSION: 1. No acute intracranial abnormality. 2. Confluent periventricular and scattered subcortical T2 hyperintensities bilaterally are moderately advanced for age. The finding is nonspecific but can be seen in the setting of chronic microvascular ischemia, a demyelinating process such as multiple sclerosis, vasculitis, complicated migraine headaches, or as the sequelae of a prior infectious or inflammatory process. 3. Remote lacunar infarct of the right thalamus. Electronically Signed   By: Marin Roberts M.D.   On: 08/27/2023 15:13    Procedures Procedures    Medications Ordered in  ED Medications  sodium chloride 0.9 % bolus 500 mL (0 mLs Intravenous Stopped 08/27/23 1327)  meclizine (ANTIVERT) tablet 12.5 mg (12.5 mg Oral Given 08/27/23 1150)  potassium chloride SA (KLOR-CON M) CR tablet 40 mEq (40 mEq Oral Given 08/27/23 1252)    ED Course/ Medical Decision Making/ A&P Clinical Course as of 08/27/23 1726  Sun Aug 27, 2023  1314 Patient feeling a little bit better.  Labs came back with low potassium.  Given her potassium supplement. [MB]  1356 Patient now feeling like she is too dizzy and does not feel safe to go home.  I have put her in for an MRI. [MB]  1523 MRI brain does not show any acute findings.  Does show an old stroke in the thalamus and some chronic microvascular changes. [MB]  1531 Patient is comfortable going home.  Will provide a prescription for the meclizine to her pharmacy.  She understands to closely follow-up with her PCP. [MB]    Clinical Course User Index [MB] Terrilee Files, MD                                 Medical Decision Making Amount and/or Complexity of Data Reviewed Labs: ordered. Radiology:  ordered.  Risk Prescription drug management.   This patient complains of dizzy lightheaded unsteady; this involves an extensive number of treatment Options and is a complaint that carries with it a high risk of complications and morbidity. The differential includes vertigo, dehydration, viral syndrome, labyrinthitis, stroke, dehydration  I ordered, reviewed and interpreted labs, which included CBC unremarkable, chemistries with low potassium, urinalysis without signs of infection I ordered medication IV fluids, meclizine, potassium and reviewed PMP when indicated. I ordered imaging studies which included MRI brain and I independently    visualized and interpreted imaging which showed no acute stroke.  Does have old stroke and microvascular changes Previous records obtained and reviewed including PCP notes and recent ED visits Social determinants considered, patient with stress, food insecurity, for early inactive Critical Interventions: None  After the interventions stated above, I reevaluated the patient and found patient to be feeling symptomatically better Admission and further testing considered, she is comfortable plan for trial of meclizine and outpatient follow-up with her PCP.  Return instructions discussed.         Final Clinical Impression(s) / ED Diagnoses Final diagnoses:  Dizziness  Hypokalemia    Rx / DC Orders ED Discharge Orders          Ordered    potassium chloride (KLOR-CON) 10 MEQ tablet  Daily        08/27/23 1316    meclizine (ANTIVERT) 12.5 MG tablet  3 times daily PRN        08/27/23 1532              Terrilee Files, MD 08/27/23 1728

## 2023-08-27 NOTE — ED Notes (Signed)
 Patient back from MRI.

## 2023-08-27 NOTE — Discharge Instructions (Signed)
 You were seen in the emergency department for dizziness.  Your potassium was low and you were given a potassium supplement.  You felt strongly about changing your over-the-counter potassium to the prescription medication.  I have sent this to the pharmacy.  Please try to eat a balanced diet.  Keep well-hydrated.  Schedule a follow-up appointment with your primary care doctor where they can recheck your lab work to see that your potassium level is improving.  Return to the emergency department if any worsening or concerning symptoms.

## 2023-08-28 ENCOUNTER — Ambulatory Visit: Payer: Self-pay | Admitting: *Deleted

## 2023-08-28 NOTE — Patient Outreach (Signed)
 Care Coordination   08/28/2023 Name: Tiffany Mendoza MRN: [id number] DOB: 1951/04/04   Care Coordination Outreach Attempts:  An unsuccessful outreach was attempted for an appointment today.  Follow Up Plan:  Additional outreach attempts will be made to offer the patient complex care management information and services.   Encounter Outcome:  Patient Visit Completed   Care Coordination Interventions:  No, not indicated    Daje Stark L. Noelle Penner, RN, BSN, CCM [place]  [facility], [facility] RN Care Manager Direct Dial: [phone]  Fax: [phone]

## 2023-08-29 ENCOUNTER — Ambulatory Visit: Payer: Self-pay | Admitting: Family Medicine

## 2023-08-29 NOTE — Telephone Encounter (Signed)
 Pt called in to ask if she needs to take potassium chloride tablets with food. RN advised pt she should take those tablets with food to avoid stomach upset. Pt verbalized understanding.  Reason for Disposition  Health Information question, no triage required and triager able to answer question  Answer Assessment - Initial Assessment Questions 1. REASON FOR CALL or QUESTION: "What is your reason for calling today?" or "How can I best help you?" or "What question do you have that I can help answer?"     Pt calling in to ask if she can potassium chloride tablets without food. RN advised ideally pt should take those tablets with food to avoid stomach upset. Pt verbalized understanding.  Protocols used: Information Only Call - No Triage-A-AH

## 2023-08-29 NOTE — Telephone Encounter (Signed)
 Copied from CRM [id number]. Topic: Clinical - Medication Question >> Aug 29, 2023  8:45 AM Higinio Roger wrote: Reason for CRM: Patient would like to know if she can take potassium chloride (KLOR-CON) 10 MEQ tablet without eating. Callback #: [phone]

## 2023-08-30 NOTE — Telephone Encounter (Signed)
 Copied from CRM [id number]. Topic: Clinical - Medication Question >> Aug 30, 2023 12:19 PM Tiffany Mendoza wrote: Reason for CRM: Patient is calling because some of her medicine is making her feel fatigue. Patient doesn't know if its the meclizine (ANTIVERT) 12.5 MG tablet or clorazepate (TRANXENE-T) 7.5 MG tablet. Patient is requesting a callback at [phone] or [phone] [facility] patient friend)

## 2023-08-31 ENCOUNTER — Encounter: Payer: Self-pay | Admitting: Family Medicine

## 2023-08-31 ENCOUNTER — Ambulatory Visit (INDEPENDENT_AMBULATORY_CARE_PROVIDER_SITE_OTHER): Payer: Self-pay | Admitting: Family Medicine

## 2023-08-31 VITALS — BP 148/88 | HR 75 | Resp 16 | Ht 67.0 in | Wt 160.4 lb

## 2023-08-31 DIAGNOSIS — F418 Other specified anxiety disorders: Secondary | ICD-10-CM

## 2023-08-31 DIAGNOSIS — Z59819 Housing instability, housed unspecified: Secondary | ICD-10-CM

## 2023-08-31 DIAGNOSIS — Z09 Encounter for follow-up examination after completed treatment for conditions other than malignant neoplasm: Secondary | ICD-10-CM

## 2023-08-31 DIAGNOSIS — I10 Essential (primary) hypertension: Secondary | ICD-10-CM | POA: Diagnosis not present

## 2023-08-31 DIAGNOSIS — R9389 Abnormal findings on diagnostic imaging of other specified body structures: Secondary | ICD-10-CM | POA: Diagnosis not present

## 2023-08-31 DIAGNOSIS — R911 Solitary pulmonary nodule: Secondary | ICD-10-CM

## 2023-08-31 DIAGNOSIS — E876 Hypokalemia: Secondary | ICD-10-CM

## 2023-08-31 MED ORDER — POTASSIUM CHLORIDE CRYS ER 10 MEQ PO TBCR: 10.0000 meq | EXTENDED_RELEASE_TABLET | Freq: Two times a day (BID) | ORAL | 5 refills | Status: DC

## 2023-08-31 NOTE — Telephone Encounter (Signed)
Pt has appt today for evaluation

## 2023-08-31 NOTE — Patient Instructions (Addendum)
 F/U in April as before, call if t you need me sooner  I have increased potassium 10 meq to TWO tabs every day, you may take them both together at the same   Lab today to check potassium  Stop potassium 99 mg tablet, only take the potassium 10 meq prescription TWO every day  OK to take all medication together at the same time  You are referred to lung Specialist because of your abnormal chest scan  Chlorazepate is to be used only if needed, for sleep  Thankful that you have a new home  Thanks for choosing [facility], we consider it a privelige to serve you.

## 2023-09-01 ENCOUNTER — Ambulatory Visit: Payer: Medicare Other | Admitting: Family Medicine

## 2023-09-01 LAB — BMP8+EGFR
BUN/Creatinine Ratio: 10 — ABNORMAL LOW (ref 12–28)
BUN: 9 mg/dL (ref 8–27)
CO2: 27 mmol/L (ref 20–29)
Calcium: 9.7 mg/dL (ref 8.7–10.3)
Chloride: 102 mmol/L (ref 96–106)
Creatinine, Ser: 0.87 mg/dL (ref 0.57–1.00)
Glucose: 91 mg/dL (ref 70–99)
Potassium: 4.5 mmol/L (ref 3.5–5.2)
Sodium: 141 mmol/L (ref 134–144)
eGFR: 70 mL/min/{1.73_m2} (ref >59)

## 2023-09-03 ENCOUNTER — Encounter: Payer: Self-pay | Admitting: Family Medicine

## 2023-09-04 ENCOUNTER — Ambulatory Visit: Payer: Self-pay | Admitting: Family Medicine

## 2023-09-04 NOTE — Telephone Encounter (Signed)
 Lab Question  Disposition: [x]  Follow-up with PCP  Additional Notes: Pt received her lab results from 3/13 on MyChart. Pt expresses life stressors such as the recent passing of her mom, moving, etc. Pt has the following question for her PCP:  Am I just needing rest or is the fatigue related to the low BUN/Creatinine ratio?        Copied from CRM [id number]. 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 She had to move things out of mom's home. [place] finally found her a place to live.  Reason for Disposition  [1] Caller requesting NON-URGENT health information AND [2] PCP's office is the best resource  Answer Assessment - Initial Assessment Questions 1. REASON FOR CALL or QUESTION: "What is your reason for calling today?" or "How can I best help you?" or "What question do you have that I can help answer?"     Am I just needing rest or is the fatigue related to the low BUN/Creatinine ratio?  Protocols used: Information Only Call - No Triage-A-AH

## 2023-09-05 ENCOUNTER — Encounter: Payer: Self-pay | Admitting: Gastroenterology

## 2023-09-05 ENCOUNTER — Ambulatory Visit (INDEPENDENT_AMBULATORY_CARE_PROVIDER_SITE_OTHER): Payer: Medicare Other | Admitting: Gastroenterology

## 2023-09-05 ENCOUNTER — Ambulatory Visit: Payer: Self-pay | Admitting: *Deleted

## 2023-09-05 VITALS — BP 110/60 | HR 80 | Ht 67.0 in | Wt 162.6 lb

## 2023-09-05 DIAGNOSIS — K641 Second degree hemorrhoids: Secondary | ICD-10-CM

## 2023-09-05 DIAGNOSIS — K648 Other hemorrhoids: Secondary | ICD-10-CM

## 2023-09-05 DIAGNOSIS — F411 Generalized anxiety disorder: Secondary | ICD-10-CM

## 2023-09-05 DIAGNOSIS — K588 Other irritable bowel syndrome: Secondary | ICD-10-CM

## 2023-09-05 DIAGNOSIS — E876 Hypokalemia: Secondary | ICD-10-CM | POA: Diagnosis not present

## 2023-09-05 DIAGNOSIS — G9332 Myalgic encephalomyelitis/chronic fatigue syndrome: Secondary | ICD-10-CM | POA: Diagnosis not present

## 2023-09-05 DIAGNOSIS — K5904 Chronic idiopathic constipation: Secondary | ICD-10-CM

## 2023-09-05 NOTE — Patient Outreach (Signed)
 Care Coordination   Follow Up Visit Note   09/05/2023 Name: Tiffany Mendoza MRN: [id number] DOB: August 09, 1950  Tiffany Mendoza is a 73 y.o. year old female who sees [name], [name], MD for primary care. I spoke with  [name] by phone today.  What matters to the patients health and wellness today?  Fatigue- reports Chronic fatigue syndrome ED visits, new apartment  Gets tired when she over exert herself. Agrees to cluster activity and rest  Follow up ED visits on 08/20/23 for palpitations, anxiety Dignosed with community acquired pneumonia & 08/27/23 for Dizziness, Hypokalemia -given meclizine, now takes 2 after seeing pcp She has obtained vitamins and herbals recommended Improved her dietary habits, now eating 3 meals & resting better She reports she is feeling better  Close Friends are visiting and helping her  Saw her gastroenterologist today - confirmed hemorrhoids, hypokalemia &  colonoscopy in 10 years  New apartment in [place]- obtained her third floor [address] apartment on August 18 2023. moved in. Housing concerns resolved  Depression spoke with pcp counselor in January 2025 Dx GAD Will make appointments with Dr Sherene Sires for follow up Pneumonia & [name] for follow up GAD, psychiatry   Warm transfer for RN CM completed. She voiced understanding of reassignment of RN CM Updated her that Danise Edge [phone]) will be assigned to her pcp office   States she is interested in joining [employer] once she gets settled in her new apartments   Goals Addressed             This Visit's Progress    Housing, Manage anxiety/depression & hypertension/hyperlipidemia-nurse care coordination service   On track    09/05/23 update: Housing resolved & she is very happy. Hyperlipidemia improved- resolved Pisodes of Hypertension/Anxiety/depression/GAD- continues but is progressing  Patient reports she will outreach to mental health &  pulmonology providers  Interventions Today    Flowsheet Row Most Recent Value  Chronic Disease   Chronic disease during today's visit Other  [GAD, chronic fatigue syndrome, new apartment, ED visits]  General Interventions   General Interventions Discussed/Reviewed General Interventions Discussed, Community Resources, Doctor Visits  Doctor Visits Discussed/Reviewed Doctor Visits Discussed, PCP, Specialist  [seen by gastroenterology today, couonselor in January 2025, Encouraged rescheduling visits with pulmonology, counselor]  PCP/Specialist Visits Compliance with follow-up visit  Exercise Interventions   Exercise Discussed/Reviewed Exercise Reviewed, Physical Activity  [staying active with moving into her new apartment]  Physical Activity Discussed/Reviewed Physical Activity Discussed, Types of exercise  [discussed clustering activity to save energy]  Mental Health Interventions   Mental Health Discussed/Reviewed Mental Health Reviewed, Coping Strategies, Other, Anxiety  [encourage appointments with psychiatry/counselor, pulmonology]  Nutrition Interventions   Nutrition Discussed/Reviewed Nutrition Reviewed, Fluid intake, Increasing proteins  Pharmacy Interventions   Pharmacy Dicussed/Reviewed Pharmacy Topics Reviewed, Medications and their functions, Affording Medications  Advanced Directive Interventions   Advanced Directives Discussed/Reviewed Advanced Directives Discussed              SDOH assessments and interventions completed:  No     Care Coordination Interventions:  Yes, provided   Follow up plan: No further intervention required. Warm transfer to Danise Edge, RN CM  Encounter Outcome:  Patient Visit Completed   Kathreen Cosier. Noelle Penner, RN, BSN, CCM [place]  [facility], [facility] RN Care Manager Direct Dial: [phone]  Fax: [phone]

## 2023-09-05 NOTE — Progress Notes (Unsigned)
 Tiffany DANNEMILLER    [id number]    Jul 26, 1950  Primary Care Physician:[name], [name], MD  Referring Physician: Kerri Perches, MD [address] [place],  [place] [address]   Chief complaint:  Hemorrhoids  Discussed the use of AI scribe software for clinical note transcription with the patient, who gave verbal consent to proceed.  History of Present Illness   Tiffany Mendoza is a 73 year old female who presents with fatigue and low potassium levels. She was referred by Dr. [name] for evaluation of low potassium levels.  She has been experiencing low potassium levels, identified during two consecutive emergency room visits due to dizziness. Initially, she was taking one potassium supplement, but the dosage was increased to two supplements after a follow-up. Her potassium levels have improved since then, but she continues to experience fatigue. No heart or kidney failure is reported.  She has been taking a diuretic, which she believes may have contributed to her low potassium levels. Blood work was repeated, showing increased potassium levels.  She experiences ongoing fatigue, consistent with her history of chronic fatigue syndrome. Despite following her treatment regimen, she continues to feel exhausted. She recalls a period of driving long distances to church, which contributed to her fatigue.  She discusses her dietary habits, noting efforts to eat three meals a day. She takes B complex, vitamin C, and vitamin D3 supplements. She feels better when she eats but still experiences fatigue.  She adheres to a regimen of MiraLAX and Benefiber to maintain regular bowel habits, which she reports as effective. She mentions a small bump in the rectal area, initially thought to be a hemorrhoid, but it has since resolved.  She shares her social history, mentioning significant stress due to caregiving responsibilities and housing instability. She lived with her  mother for seventy years and faced uncertainty about her living situation after her father's passing. She now resides in her own apartment and feels more settled.       GI Hx:  Colonoscopy 06/15/2017 - Redundant colon. - One 1 mm Hyperplastic polyp in the rectum, removed with a cold biopsy forceps. Resected and retrieved. - Non-bleeding internal hemorrhoids. - Diverticulosis in the sigmoid colon and in the ascending colon.   Colonoscopy 02-23-07 Hemorrhoids, External.    Outpatient Encounter Medications as of 09/05/2023  Medication Sig   clorazepate (TRANXENE-T) 7.5 MG tablet Take 1 tablet (7.5 mg total) by mouth 2 (two) times daily as needed for anxiety.   hydrochlorothiazide (HYDRODIURIL) 25 MG tablet Take 1 tablet by mouth once daily   meclizine (ANTIVERT) 12.5 MG tablet Take 1 tablet (12.5 mg total) by mouth 3 (three) times daily as needed for dizziness.   potassium chloride (KLOR-CON M) 10 MEQ tablet Take 1 tablet (10 mEq total) by mouth 2 (two) times daily.   sertraline (ZOLOFT) 50 MG tablet TAKE 1 & 1/2 (ONE & ONE-HALF) TABLETS BY MOUTH ONCE DAILY   Cholecalciferol (VITAMIN D-3 PO) Take 1 tablet by mouth daily. (Patient not taking: Reported on 09/05/2023)   Omega-3 Fatty Acids (FISH OIL PO) Take 1 capsule by mouth daily. (Patient not taking: Reported on 09/05/2023)   [DISCONTINUED] potassium chloride (KLOR-CON) 10 MEQ tablet Take 1 tablet (10 mEq total) by mouth daily.   No facility-administered encounter medications on file as of 09/05/2023.    Allergies as of 09/05/2023   (No Known Allergies)    Past Medical History:  Diagnosis  Date   Adjustment disorder with mixed anxiety and depressed mood    Allergy    Carotid bruit    Depression    Dermatophytosis of foot    GERD (gastroesophageal reflux disease)    Hypertension    Insomnia    Neck pain    Osteoporosis     Past Surgical History:  Procedure Laterality Date   ABDOMINAL HYSTERECTOMY  1988   HEMORRHOID SURGERY      myoectomy  1984    Family History  Problem Relation Age of Onset   Memory loss Mother    Colon polyps Father    Prostate cancer Father    ALS Father    Thyroid disease Sister    Thyroid nodules Sister        Thyroidectomy in 1984   Fibroids Sister    Hypertension Sister    Pancreatic cancer Maternal Grandfather    Liver cancer Paternal Grandmother    Stomach cancer Neg Hx    Colon cancer Neg Hx    Esophageal cancer Neg Hx     Social History   Socioeconomic History   Marital status: Divorced    Spouse name: Not on file   Number of children: 0   Years of education: college   Highest education level: Bachelor's degree (e.g., BA, AB, BS)  Occupational History   Occupation: caregiver  Tobacco Use   Smoking status: Former    Types: Cigarettes   Smokeless tobacco: Never  Vaping Use   Vaping status: Never Used  Substance and Sexual Activity   Alcohol use: Not Currently    Comment: occasional wine   Drug use: No   Sexual activity: Not Currently  Other Topics Concern   Not on file  Social History Narrative   Lives alone, divorced   Was the caregiver for her Mother prior to her passing in June 2023 who had dementia    2024 continues to live in her mother's home   Social Drivers of Health   Financial Resource Strain: Medium Risk (06/18/2023)   Overall Financial Resource Strain (CARDIA)    Difficulty of Paying Living Expenses: Somewhat hard  Food Insecurity: Food Insecurity Present (06/18/2023)   Hunger Vital Sign    Worried About Running Out of Food in the Last Year: Sometimes true    Ran Out of Food in the Last Year: Never true  Transportation Needs: No Transportation Needs (06/18/2023)   PRAPARE - Administrator, Civil Service (Medical): No    Lack of Transportation (Non-Medical): No  Physical Activity: Inactive (06/18/2023)   Exercise Vital Sign    Days of Exercise per Week: 0 days    Minutes of Exercise per Session: 0 min  Stress: Stress Concern  Present (06/18/2023)   [employer] of Occupational Health - Occupational Stress Questionnaire    Feeling of Stress : To some extent  Social Connections: Unknown (06/18/2023)   Social Connection and Isolation Panel [NHANES]    Frequency of Communication with Friends and Family: Three times a week    Frequency of Social Gatherings with Friends and Family: Patient declined    Attends Religious Services: Patient declined    Active Member of Clubs or Organizations: No    Attends Banker Meetings: Never    Marital Status: Divorced  Catering manager Violence: Not At Risk (01/16/2023)   Humiliation, Afraid, Rape, and Kick questionnaire    Fear of Current or Ex-Partner: No    Emotionally Abused: No  Physically Abused: No    Sexually Abused: No      Review of systems: All other review of systems negative except as mentioned in the HPI.   Physical Exam: Vitals:   09/05/23 1055  BP: 110/60  Pulse: 80   Body mass index is 25.47 kg/m. Gen:      No acute distress HEENT:  sclera anicteric Abd:      soft, non-tender; no palpable masses, no distension Ext:    No edema Neuro: alert and oriented x 3 Psych: normal mood and affect Rectal exam: Normal anal sphincter tone, no anal fissure or external hemorrhoids Anoscopy: Small internal hemorrhoids, no active bleeding, normal dentate line, no visible nodules  Data Reviewed:  Reviewed labs, radiology imaging, old records and pertinent past GI work up     Assessment and Plan    Hemorrhoids Confirmed during rectal examination with no other concerning findings in the rectal area. - Continue MiraLAX and Benefiber to maintain regular bowel habits. - Provide IV card samples for use as needed.  Hypokalemia Low potassium levels likely due to diuretic use. Initial potassium supplement was insufficient, causing dizziness and fatigue. Increased dosage to two supplements improved potassium levels. No signs of heart or kidney  failure. Managed by PCP, continue with recommendations per PCP   Chronic Fatigue Syndrome Ongoing fatigue potentially exacerbated by stress and dietary habits. Advised to maintain regular eating schedule and take multivitamins. Emphasis on stress management and adequate nutrition to improve symptoms. - Encourage eating three meals a day. - Recommend multivitamin (One A Day or Centrum). - Continue vitamin D3. - Consider adding B complex and vitamin C supplements.  Generalized Anxiety Disorder Experiencing stress and anxiety related to caregiving responsibilities and housing instability. Improved after obtaining own apartment and support from friends. - Encourage stress management techniques and support systems.  Colonoscopy Follow-up Inquired about need for sigmoidoscopy or colonoscopy. Previous colonoscopy in 2018, due for surveillance colonoscopy in 10 years.  She is average risk, no family history of colon cancer or personal history of adenomatous colon polyps, recall 2028        The patient was provided an opportunity to ask questions and all were answered. The patient agreed with the plan and demonstrated an understanding of the instructions.  Iona Beard , MD    CC: Kerri Perches, MD

## 2023-09-05 NOTE — Patient Instructions (Addendum)
 VISIT SUMMARY:  Today, we discussed your ongoing fatigue and low potassium levels. We reviewed your recent blood work, which showed improved potassium levels after increasing your supplement dosage. We also addressed your chronic fatigue syndrome, dietary habits, and stress management. Additionally, we confirmed the presence of hemorrhoids and discussed your anxiety related to caregiving and housing instability. Lastly, we reviewed your need for future colonoscopy or sigmoidoscopy.  YOUR PLAN:  -HEMORRHOIDS: Hemorrhoids are swollen veins in the lower rectum or anus. Continue taking MiraLAX and Benefiber to maintain regular bowel habits. Use the provided IV card samples as needed.  -HYPOKALEMIA: Hypokalemia means low potassium levels in the blood, often caused by diuretic use. Continue taking two potassium supplements daily and recheck potassium levels as needed.  -CHRONIC FATIGUE SYNDROME: Chronic fatigue syndrome is a condition characterized by extreme fatigue that doesn't improve with rest. Maintain a regular eating schedule, take a multivitamin (One A Day or Centrum), continue vitamin D3, and consider adding B complex and vitamin C supplements. Focus on stress management and adequate nutrition.  -GENERALIZED ANXIETY DISORDER: Generalized anxiety disorder involves excessive worry and stress. Continue practicing stress management techniques and seek support from friends and family.  -COLONOSCOPY FOLLOW-UP: Since your previous colonoscopy was normal, you do not need another one for ten years. A sigmoidoscopy is not recommended as it is less comprehensive.  INSTRUCTIONS:  Please continue with your current medications and supplements as discussed. Recheck your potassium levels as needed. Maintain a regular eating schedule and focus on stress management. No need for another colonoscopy for ten years if your previous results were normal.  I appreciate the  opportunity to care for you  Thank You    Marsa Aris , MD

## 2023-09-05 NOTE — Patient Instructions (Signed)
 Visit Information  Thank you for taking time to visit with me today. Please don't hesitate to contact me if I can be of assistance to you.    Danise Edge, care manager [phone]) will be assigned to your pcp office    Following are the goals we discussed today:   Goals Addressed             This Visit's Progress    Housing, Manage anxiety/depression & hypertension/hyperlipidemia-nurse care coordination service   On track    09/05/23 update: Housing resolved & she is very happy. Hyperlipidemia improved- resolved Pisodes of Hypertension/Anxiety/depression/GAD- continues but is progressing  Patient reports she will outreach to mental health & pulmonology providers  Interventions Today    Flowsheet Row Most Recent Value  Chronic Disease   Chronic disease during today's visit Other  [GAD, chronic fatigue syndrome, new apartment, ED visits]  General Interventions   General Interventions Discussed/Reviewed General Interventions Discussed, Community Resources, Doctor Visits  Doctor Visits Discussed/Reviewed Doctor Visits Discussed, PCP, Specialist  [seen by gastroenterology today, couonselor in January 2025, Encouraged rescheduling visits with pulmonology, counselor]  PCP/Specialist Visits Compliance with follow-up visit  Exercise Interventions   Exercise Discussed/Reviewed Exercise Reviewed, Physical Activity  [staying active with moving into her new apartment]  Physical Activity Discussed/Reviewed Physical Activity Discussed, Types of exercise  [discussed clustering activity to save energy]  Mental Health Interventions   Mental Health Discussed/Reviewed Mental Health Reviewed, Coping Strategies, Other, Anxiety  [encourage appointments with psychiatry/counselor, pulmonology]  Nutrition Interventions   Nutrition Discussed/Reviewed Nutrition Reviewed, Fluid intake, Increasing proteins  Pharmacy Interventions   Pharmacy Dicussed/Reviewed Pharmacy Topics Reviewed, Medications and  their functions, Affording Medications  Advanced Directive Interventions   Advanced Directives Discussed/Reviewed Advanced Directives Discussed              Our next appointment is by telephone on pending at pending  Please call the care guide team at [phone] if you need to cancel or reschedule your appointment.   If you are experiencing a Mental Health or Behavioral Health Crisis or need someone to talk to, please call the Suicide and Crisis Lifeline: 988 call the [place] National Suicide Prevention Lifeline: 504-384-1713 or TTY: (813)842-7926 TTY 650-595-3240) to talk to a trained counselor call 1-800-273-TALK (toll free, 24 hour hotline) go to [facility] [address] [place] [phone]) call 911   Patient verbalizes understanding of instructions and care plan provided today and agrees to view in MyChart. Active MyChart status and patient understanding of how to access instructions and care plan via MyChart confirmed with patient.     The patient has been provided with contact information for the care management team and has been advised to call with any health related questions or concerns.   Kalyn Hofstra L. Noelle Penner, RN, BSN, CCM [place]  [facility], [facility] RN Care Manager Direct Dial: [phone]  Fax: [phone]

## 2023-09-06 ENCOUNTER — Encounter: Payer: Self-pay | Admitting: Gastroenterology

## 2023-09-10 ENCOUNTER — Encounter: Payer: Self-pay | Admitting: Family Medicine

## 2023-09-10 DIAGNOSIS — R911 Solitary pulmonary nodule: Secondary | ICD-10-CM | POA: Insufficient documentation

## 2023-09-10 DIAGNOSIS — R9389 Abnormal findings on diagnostic imaging of other specified body structures: Secondary | ICD-10-CM | POA: Insufficient documentation

## 2023-09-10 NOTE — Assessment & Plan Note (Signed)
 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

## 2023-09-10 NOTE — Assessment & Plan Note (Signed)
 Reports being in her own housing finally which is GREAT!

## 2023-09-10 NOTE — Progress Notes (Signed)
   Tiffany Mendoza     MRN: [id number]      DOB: 04/17/1951  Chief Complaint  Patient presents with   ER follow up    Was seen at the ER on 3/2 and was treated for CAP and again 3/9 for dizziness and was treated for low potassium.     HPI Tiffany Mendoza is here for follow up of 2 recent ED visits, as stated  She is concerned about the abnormality on lung scan whch will need close f/u for the next 5 years, hence she is being referred to Pulmonary States dizziness is better  States she has now got housing of her own which is indeed a bIG relief ROS Denies recent fever or chills. Denies sinus pressure, nasal congestion, ear pain or sore throat. Denies chest congestion, productive cough or wheezing. Denies chest pains, palpitations and leg swelling    Denies dysuria, frequency, hesitancy or incontinence. Denies joint pain, swelling and limitation in mobility. Denies headaches, seizures, numbness, or tingling. Denies skin break down or rash.   PE  BP (!) 148/88   Pulse 75   Resp 16   Ht 5\' 7"  (1.702 m)   Wt 160 lb 6.4 oz (72.8 kg)   SpO2 97%   BMI 25.12 kg/m   Patient alert and oriented and in no cardiopulmonary distress.  HEENT: No facial asymmetry, EOMI,     Neck supple .  Chest: Clear to auscultation bilaterally.  CVS: S1, S2 no murmurs, no S3.Regular rate.  ABD: Soft non tender.   Ext: No edema  MS: Adequate ROM spine, shoulders, hips and knees.  Skin: Intact, no ulcerations or rash noted.  Psych: Good eye contact, mildly  anxious and  depressed appearing.  CNS: CN 2-12 intact, power,  normal throughout.no focal deficits noted.   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 The importance of attaining a healthy weight is also discussed.     09/05/2023   10:55 AM 08/31/2023   11:08 AM 08/27/2023    3:34 PM 08/27/2023    1:30 PM 08/27/2023    1:15 PM 08/27/2023    1:00 PM 08/27/2023   12:45 PM  BP/Weight  Systolic BP 110 148 158 158 165 161 155  Diastolic BP 60 88 90 88 88 87 87  Wt. (Lbs) 162.6 160.4       BMI 25.47 kg/m2 25.12 kg/m2            Dyslipidemia Hyperlipidemia:Low fat diet discussed and encouraged.   Lipid Panel  Lab Results  Component Value Date   CHOL 142 05/16/2023   HDL 74 05/16/2023   LDLCALC 57 05/16/2023   TRIG 48 05/16/2023   CHOLHDL 1.9 05/16/2023       Mixed anxiety and depressive disorder Continue current medication  Housing insecurity Reports being in her own housing finally which is GREAT!  Encounter for examination following treatment at hospital Patient in for follow up of recent ED visits Discharge summary, and laboratory and radiology data are reviewed, and any questions or concerns  are discussed. Specific issues requiring follow up are specifically addressed.

## 2023-09-10 NOTE — Assessment & Plan Note (Signed)
 Change dosing and update lab

## 2023-09-10 NOTE — Assessment & Plan Note (Signed)
 Continue current medication.

## 2023-09-10 NOTE — Assessment & Plan Note (Signed)
 Hyperlipidemia:Low fat diet discussed and encouraged.   Lipid Panel  Lab Results  Component Value Date   CHOL 142 05/16/2023   HDL 74 05/16/2023   LDLCALC 57 05/16/2023   TRIG 48 05/16/2023   CHOLHDL 1.9 05/16/2023

## 2023-09-10 NOTE — Assessment & Plan Note (Addendum)
 Un Controlled, no change in medication will re eval DASH diet and commitment to daily physical activity for a minimum of 30 minutes discussed and encouraged, as a part of hypertension management. The importance of attaining a healthy weight is also discussed.     09/05/2023   10:55 AM 08/31/2023   11:08 AM 08/27/2023    3:34 PM 08/27/2023    1:30 PM 08/27/2023    1:15 PM 08/27/2023    1:00 PM 08/27/2023   12:45 PM  BP/Weight  Systolic BP 110 148 158 158 165 161 155  Diastolic BP 60 88 90 88 88 87 87  Wt. (Lbs) 162.6 160.4       BMI 25.47 kg/m2 25.12 kg/m2

## 2023-09-10 NOTE — Assessment & Plan Note (Signed)
Patient in for follow up of recent ED visits. Discharge summary, and laboratory and radiology data are reviewed, and any questions or concerns  are discussed. Specific issues requiring follow up are specifically addressed.  

## 2023-09-15 ENCOUNTER — Telehealth: Payer: Self-pay | Admitting: Family Medicine

## 2023-09-15 ENCOUNTER — Other Ambulatory Visit: Payer: Self-pay | Admitting: Family Medicine

## 2023-09-15 DIAGNOSIS — Z5986 Financial insecurity: Secondary | ICD-10-CM

## 2023-09-15 NOTE — Telephone Encounter (Signed)
 Copied from CRM [id number]. Topic: General - Other >> Sep 15, 2023 11:17 AM Antwanette L wrote: Reason for CRM: The patient is calling in to request a Child psychotherapist. The patient is not able to work at the moment and is dealing with extreme chronic fatigue syndrome. By the patient not working, it's going to effect her rent. The patient had a Child psychotherapist before and the patient doesn't wont to see the previous worker. Please contact patient at [phone].

## 2023-09-18 ENCOUNTER — Other Ambulatory Visit: Payer: Self-pay | Admitting: Family Medicine

## 2023-09-18 NOTE — Telephone Encounter (Signed)
 Copied from CRM [id number]. Topic: Clinical - Medication Refill >> Sep 18, 2023 10:31 AM Franchot Heidelberg wrote: Pt has new pharmacy listed below  Most Recent Primary Care Visit:  Provider: Kerri Perches  Department: [facility]  Visit Type: HOSPITAL FOLLOW UP  Date: 08/31/2023  Medication: sertraline (ZOLOFT) 50 MG tablet  hydrochlorothiazide (HYDRODIURIL) 25 MG tablet   Has the patient contacted their pharmacy? Yes (Agent: If no, request that the patient contact the pharmacy for the refill. If patient does not wish to contact the pharmacy document the reason why and proceed with request.) (Agent: If yes, when and what did the pharmacy advise?)  Is this the correct pharmacy for this prescription? Yes If no, delete pharmacy and type the correct one.  This is the patient's preferred pharmacy:  [facility] [address] [place] - [address] [place] [address] Phone: [phone] Fax: [phone]   Has the prescription been filled recently? Yes  Is the patient out of the medication? No  Has the patient been seen for an appointment in the last year OR does the patient have an upcoming appointment? Yes  Can we respond through MyChart? Yes  Agent: Please be advised that Rx refills may take up to 3 business days. We ask that you follow-up with your pharmacy.

## 2023-09-19 ENCOUNTER — Telehealth: Payer: Self-pay

## 2023-09-19 MED ORDER — SERTRALINE HCL 50 MG PO TABS: ORAL_TABLET | ORAL | 0 refills | Status: DC

## 2023-09-19 MED ORDER — HYDROCHLOROTHIAZIDE 25 MG PO TABS: 25.0000 mg | ORAL_TABLET | Freq: Every day | ORAL | 0 refills | Status: DC

## 2023-09-19 NOTE — Progress Notes (Signed)
 Complex Care Management Note  Care Guide Note 09/19/2023 Name: Tiffany Mendoza MRN: [id number] DOB: 02/04/1951  Tiffany Mendoza is a 73 y.o. year old female who sees Kerri Perches, MD for primary care. I reached out to [name] by phone today to offer complex care management services.  [name] was given information about Complex Care Management services today including:   The Complex Care Management services include support from the care team which includes your Nurse Care Manager, Clinical Social Worker, or Pharmacist.  The Complex Care Management team is here to help remove barriers to the health concerns and goals most important to you. Complex Care Management services are voluntary, and the patient may decline or stop services at any time by request to their care team member.   Complex Care Management Consent Status: Patient agreed to services and verbal consent obtained.   Follow up plan:  Telephone appointment with complex care management team member scheduled for:  09/22/2023  Encounter Outcome:  Patient Scheduled  Penne Lash , RMA     [place]  [name], [facility] Guide  Direct Dial: [phone]  Website: [email or web address]

## 2023-09-22 ENCOUNTER — Ambulatory Visit: Payer: Self-pay | Admitting: Licensed Clinical Social Worker

## 2023-09-22 NOTE — Patient Outreach (Signed)
 Care Coordination   Initial Visit Note   09/22/2023 Name: Tiffany Mendoza MRN: [id number] DOB: 11-23-1950  Tiffany Mendoza is a 73 y.o. year old female who sees [name], [name], MD for primary care. I spoke with  Tiffany Mendoza by phone today.  What matters to the patients health and wellness today?  Pt given information to increased socialization and decrease anxiey symptoms and social isolation    Goals Addressed             This Visit's Progress    Care Coordination       Call Dept of Social Services to have medicaid transferred to [facility] in self care to alleviate anxiety symptoms  Pt encouraged to contact [facility] [place] and [facility] to engage in support and group activities.         SDOH assessments and interventions completed:  Yes  SDOH Interventions Today    Flowsheet Row Most Recent Value  SDOH Interventions   Food Insecurity Interventions Intervention Not Indicated  Housing Interventions Intervention Not Indicated  Transportation Interventions Intervention Not Indicated  Utilities Interventions Intervention Not Indicated        Care Coordination Interventions:  Yes, provided  Interventions Today    Flowsheet Row Most Recent Value  General Interventions   General Interventions Discussed/Reviewed Community Resources  Education Interventions   Education Provided Provided Education  [pt encouraged to called dept of social services to have medicaid transfer to [place].]  Provided Verbal Education On [employer]  [pt given resources to increase socialization.]  Mental Health Interventions   Mental Health Discussed/Reviewed Mental Health Reviewed, Anxiety  [Pt encouraged to engaged in self care]       Follow up plan: Follow up call scheduled for 10/06/2023    Encounter Outcome:  Patient Visit Completed

## 2023-09-22 NOTE — Patient Instructions (Signed)
 Visit Information  Thank you for taking time to visit with me today. Please don't hesitate to contact me if I can be of assistance to you.   Following are the goals we discussed today:   Goals Addressed             This Visit's Progress    Care Coordination       Call Dept of Social Services to have medicaid transferred to [facility] in self care to alleviate anxiety symptoms  Pt encouraged to contact [facility] [place] and [facility] to engage in support and group activities.         Our next appointment is by telephone on 10/06/2023  Please call the care guide team at [phone] if you need to cancel or reschedule your appointment.   If you are experiencing a Mental Health or Behavioral Health Crisis or need someone to talk to, please call [phone]   Patient verbalizes understanding of instructions and care plan provided today and agrees to view in MyChart. Active MyChart status and patient understanding of how to access instructions and care plan via MyChart confirmed with patient.     Telephone follow up appointment with care management team member scheduled for:10/06/2023  Gwyndolyn Saxon MSW, LCSW Licensed Clinical Social Worker  [facility], Population Health Direct Dial: [phone]  Fax: [phone]

## 2023-09-24 NOTE — Progress Notes (Unsigned)
 Tiffany Mendoza, female    DOB: 1950/11/03    MRN: [id number]   Brief patient profile:  51  yobf  stopped "social smoking"  around her 30s referred to pulmonary clinic in [place]  09/28/2023 by Dr Tiffany Mendoza for doe/ abnormal CT chest      History of Present Illness  09/28/2023  Pulmonary/ 1st office eval/ Tiffany Mendoza / Tiffany Mendoza Office  Chief Complaint  Patient presents with   Establish Care  Dyspnea:  mostly tired but "doesn't she it  until stops " Cough: resolved  Sleep: bed is flat/ 2 pillows  SABA use: never  02: none    No obvious day to day or daytime pattern/variability or assoc excess/ purulent sputum or mucus plugs or hemoptysis or cp or chest tightness, subjective wheeze or overt sinus or hb symptoms.    Also denies any obvious fluctuation of symptoms with weather or environmental changes or other aggravating or alleviating factors except as outlined above   No unusual exposure hx or h/o childhood pna/ asthma or knowledge of premature birth.  Current Allergies, Complete Past Medical History, Past Surgical History, Family History, and Social History were reviewed in [employer] record.  ROS  The following are not active complaints unless bolded Hoarseness, sore throat, dysphagia, dental problems, itching, sneezing,  nasal congestion or discharge of excess mucus or purulent secretions, ear ache,   fever, chills, sweats, unintended wt loss or wt gain, classically pleuritic or exertional cp,  orthopnea pnd or arm/hand swelling  or leg swelling, presyncope, palpitations, abdominal pain, anorexia, nausea, vomiting, diarrhea  or change in bowel habits or change in bladder habits, change in stools or change in urine, dysuria, hematuria,  rash, arthralgias, visual complaints, headache, numbness, weakness or ataxia or problems with walking or coordination,  change in mood or  memory.            Outpatient Medications Prior to Visit  Medication Sig Dispense  Refill   clorazepate (TRANXENE-T) 7.5 MG tablet Take 1 tablet (7.5 mg total) by mouth 2 (two) times daily as needed for anxiety. 60 tablet 5   hydrochlorothiazide (HYDRODIURIL) 25 MG tablet Take 1 tablet (25 mg total) by mouth daily. 90 tablet 0   potassium chloride (KLOR-CON M) 10 MEQ tablet Take 1 tablet (10 mEq total) by mouth 2 (two) times daily. 60 tablet 5   potassium chloride (KLOR-CON) 10 MEQ tablet Take 10 mEq by mouth 2 (two) times daily.     sertraline (ZOLOFT) 50 MG tablet TAKE 1 & 1/2 (ONE & ONE-HALF) TABLETS BY MOUTH ONCE DAILY 45 tablet 0   meclizine (ANTIVERT) 12.5 MG tablet Take 1 tablet (12.5 mg total) by mouth 3 (three) times daily as needed for dizziness. (Patient not taking: Reported on 09/28/2023) 30 tablet 0   Cholecalciferol (VITAMIN D-3 PO) Take 1 tablet by mouth daily. (Patient not taking: Reported on 09/05/2023)     Omega-3 Fatty Acids (FISH OIL PO) Take 1 capsule by mouth daily. (Patient not taking: Reported on 09/05/2023)     No facility-administered medications prior to visit.    Past Medical History:  Diagnosis Date   Adjustment disorder with mixed anxiety and depressed mood    Allergy    Carotid bruit    Depression    Dermatophytosis of foot    GERD (gastroesophageal reflux disease)    Hypertension    Insomnia    Neck pain    Osteoporosis       Objective:  BP (!) 148/75   Pulse 74   Ht 5\' 7"  (1.702 m)   Wt 165 lb (74.8 kg)   SpO2 97%   BMI 25.84 kg/m   Wt Readings from Last 3 Encounters:  09/28/23 165 lb (74.8 kg)  09/05/23 162 lb 9.6 oz (73.8 kg)  08/31/23 160 lb 6.4 oz (72.8 kg)      SpO2: 97 % somber amb bf nad   HEENT : Oropharynx  clear      Nasal turbinates nl    NECK :  without  apparent JVD/ palpable Nodes/TM    LUNGS: no acc muscle use,  Nl contour chest which is clear to A and P bilaterally without cough on insp or exp maneuvers   CV:  RRR  no s3 or murmur or increase in P2, and no edema   ABD:  soft and nontender    MS:  Gait nl   ext warm without deformities Or obvious joint restrictions  calf tenderness, cyanosis or clubbing    SKIN: warm and dry without lesions    NEURO:  alert, approp, nl sensorium with  no motor or cerebellar deficits apparent.    I personally reviewed images and agree with radiology impression as follows:   Chest CTw contrast   08/20/23  Focal ground-glass opacity within the lingula, accounting for the radiographic finding. This is questionably present on the previous cardiac CT, although has enlarged in the interval. No significant associated solid components. This finding is indeterminate in etiology and could reflect an area of focal inflammation, postinflammatory scarring or an adenocarcinoma precursor Rec    recommend a non-contrast Chest CT at 6 months to confirm persistence     Assessment   Abnormal CT scan, chest  Minimal smoking hx stopped in her 30s - Chest CTw contrast   08/20/23  Focal ground-glass opacity within the lingula, accounting for the radiographic finding. This is questionably present on the previous cardiac CT, although has enlarged in the interval. No significant associated solid components. This finding is indeterminate in etiology and could reflect an area of focal inflammation, postinflammatory scarring or an adenocarcinoma precursor Rec    recommend a non-contrast Chest CT at 6 months to confirm persistence  > ordered for 02/20/24 as Super D  CT results reviewed with pt >>>  GG changes are mild and chronic and localized to lingula and would not explain any of her atypical resp symtoms >>> best option for now is follow the Fleischner society guidelines as rec by radiology  = 6 month comparison CT then consider navigational bx if any growth on apples to apples comparison to the cxt from 08/20/23 > ordered as Super D for  02/20/24   Discussed in detail all the  indications, usual  risks and alternatives  relative to the benefits with patient who agrees  to proceed with w/u as outlined.       DOE (dyspnea on exertion) 09/28/2023   Walked on RA  x  3  lap(s) =  approx 450  ft  @ mod pace, stopped due to end of study  with lowest 02 sats 97% cc fatigue/ no doe or cp   C/w deconditioning  - rec sub max exercise x up to 30 min daily and f/u if not improving over then next 3 m, especially if doe still a concern.  Each maintenance medication was reviewed in detail including emphasizing most importantly the difference between maintenance and prns and under what circumstances the prns are  to be triggered using an action plan format where appropriate.  Total time for H and P, chart review, counseling,  directly observing portions of ambulatory 02 saturation study/ and generating customized AVS unique to this office visit / same day charting = 34 min new pt eval.                   Sandrea Hughs, MD 09/28/2023

## 2023-09-27 NOTE — Telephone Encounter (Signed)
 Pt referred to [facility] social work and has had a visit

## 2023-09-28 ENCOUNTER — Encounter: Payer: Self-pay | Admitting: Internal Medicine

## 2023-09-28 ENCOUNTER — Ambulatory Visit (INDEPENDENT_AMBULATORY_CARE_PROVIDER_SITE_OTHER): Admitting: Family Medicine

## 2023-09-28 ENCOUNTER — Ambulatory Visit (INDEPENDENT_AMBULATORY_CARE_PROVIDER_SITE_OTHER): Admitting: Internal Medicine

## 2023-09-28 VITALS — BP 135/79 | HR 66 | Resp 18 | Ht 67.0 in | Wt 164.1 lb

## 2023-09-28 VITALS — BP 148/75 | HR 74 | Ht 67.0 in | Wt 165.0 lb

## 2023-09-28 DIAGNOSIS — F418 Other specified anxiety disorders: Secondary | ICD-10-CM | POA: Diagnosis not present

## 2023-09-28 DIAGNOSIS — F321 Major depressive disorder, single episode, moderate: Secondary | ICD-10-CM

## 2023-09-28 DIAGNOSIS — Z5986 Financial insecurity: Secondary | ICD-10-CM

## 2023-09-28 DIAGNOSIS — R0609 Other forms of dyspnea: Secondary | ICD-10-CM

## 2023-09-28 DIAGNOSIS — E876 Hypokalemia: Secondary | ICD-10-CM

## 2023-09-28 DIAGNOSIS — I10 Essential (primary) hypertension: Secondary | ICD-10-CM | POA: Diagnosis not present

## 2023-09-28 DIAGNOSIS — R918 Other nonspecific abnormal finding of lung field: Secondary | ICD-10-CM

## 2023-09-28 DIAGNOSIS — R9389 Abnormal findings on diagnostic imaging of other specified body structures: Secondary | ICD-10-CM

## 2023-09-28 MED ORDER — SERTRALINE HCL 50 MG PO TABS: ORAL_TABLET | ORAL | 3 refills | Status: DC

## 2023-09-28 NOTE — Assessment & Plan Note (Signed)
 Controlled, no change in medication

## 2023-09-28 NOTE — Patient Instructions (Signed)
 F/U in 8 weeks call if you need me sooner  New higher dose of sertraline 50 mg tWO daily   Lab today  I recommend giving yourself time to rest , get renewed physically and mentally   Thanks for choosing [facility], we consider it a privelige to serve you.

## 2023-09-28 NOTE — Assessment & Plan Note (Signed)
 Updated lab needed at/ before next visit.

## 2023-09-28 NOTE — Assessment & Plan Note (Signed)
Increase zoloft to 100mg daily

## 2023-09-28 NOTE — Patient Instructions (Addendum)
 To get the most out of exercise, you need to be continuously aware that you are short of breath, but never out of breath, for at least 30 minutes daily. As you improve, it will actually be easier for you to do the same amount of exercise  in  30 minutes so always push to the level where you are short of breath.     My office will be contacting you by phone for referral to for follow up CT chest 02/2024   - if you don't hear back from my office within one week please call us back or notify us thru MyChart and we'll address it right away.   Pulmonary follow up is as needed

## 2023-09-29 ENCOUNTER — Encounter: Payer: Self-pay | Admitting: Family Medicine

## 2023-09-29 DIAGNOSIS — F321 Major depressive disorder, single episode, moderate: Secondary | ICD-10-CM | POA: Insufficient documentation

## 2023-09-29 DIAGNOSIS — R0609 Other forms of dyspnea: Secondary | ICD-10-CM | POA: Insufficient documentation

## 2023-09-29 LAB — BASIC METABOLIC PANEL WITH GFR
BUN/Creatinine Ratio: 16 (ref 12–28)
BUN: 12 mg/dL (ref 8–27)
CO2: 29 mmol/L (ref 20–29)
Calcium: 10 mg/dL (ref 8.7–10.3)
Chloride: 100 mmol/L (ref 96–106)
Creatinine, Ser: 0.76 mg/dL (ref 0.57–1.00)
Glucose: 92 mg/dL (ref 70–99)
Potassium: 4.2 mmol/L (ref 3.5–5.2)
Sodium: 141 mmol/L (ref 134–144)
eGFR: 82 mL/min/{1.73_m2} (ref >59)

## 2023-09-29 NOTE — Assessment & Plan Note (Signed)
 09/28/2023   Walked on RA  x  3  lap(s) =  approx 450  ft  @ mod pace, stopped due to end of study  with lowest 02 sats 97% cc fatigue/ no doe or cp   C/w deconditioning  - rec sub max exercise x up to 30 min daily and f/u if not improving over then next 3 m, especially if doe still a concern.  Each maintenance medication was reviewed in detail including emphasizing most importantly the difference between maintenance and prns and under what circumstances the prns are to be triggered using an action plan format where appropriate.  Total time for H and P, chart review, counseling,  directly observing portions of ambulatory 02 saturation study/ and generating customized AVS unique to this office visit / same day charting = 34 min new pt eval.

## 2023-09-29 NOTE — Assessment & Plan Note (Signed)
 Increase sertraline to 100 mg daily ( two 50 mg tabs) re eval in  8 weeks Continue therapy and allow time for healing

## 2023-09-29 NOTE — Assessment & Plan Note (Addendum)
 Minimal smoking hx stopped in her 30s - Chest CTw contrast   08/20/23  Focal ground-glass opacity within the lingula, accounting for the radiographic finding. This is questionably present on the previous cardiac CT, although has enlarged in the interval. No significant associated solid components. This finding is indeterminate in etiology and could reflect an area of focal inflammation, postinflammatory scarring or an adenocarcinoma precursor Rec    recommend a non-contrast Chest CT at 6 months to confirm persistence  > ordered for 02/20/24 as Super D  CT results reviewed with pt >>>  GG changes are mild and chronic and localized to lingula and would not explain any of her atypical resp symtoms >>> best option for now is follow the Fleischner society guidelines as rec by radiology  = 6 month comparison CT then consider navigational bx if any growth on apples to apples comparison to the cxt from 08/20/23 > ordered as Super D for  02/20/24   Discussed in detail all the  indications, usual  risks and alternatives  relative to the benefits with patient who agrees to proceed with w/u as outlined.

## 2023-09-29 NOTE — Assessment & Plan Note (Signed)
 Resolved now in her own place although seems as though financially there is still a burden, nephew assisting

## 2023-09-29 NOTE — Progress Notes (Signed)
   CHAR FELTMAN     MRN: [id number]      DOB: 29-Aug-1950  Chief Complaint  Patient presents with   Follow-up    Follow up on low potassium. Has improved since starting potassium but still having occasional dizziness and tiredness     HPI [name] is here for follow up and re-evaluation of chronic medical conditions, medication management and review of any available recent lab and radiology data.  States she feels somewhat better overall but feels she has chronic fatigue syndrome as she stays tired all the time Benefiting from therapy, thankful that  I endorse an illness named chronic fatigue Willing to accept the fact that she has physically and mentally and emotionally struggled for at least 8 years while caring for her Mom with intenal doiscord amongst her siblings Still reports support of friends and her nephew which is good Content in her new home though  may be a financial challenge involved ROS Denies recent fever or chills. Denies sinus pressure, nasal congestion, ear pain or sore throat. Denies chest congestion, productive cough or wheezing. Denies chest pains, palpitations and leg swelling Denies abdominal pain, nausea, vomiting,diarrhea or constipation.   Denies dysuria, frequency, hesitancy or incontinence. Denies joint pain, swelling and limitation in mobility. Denies headaches, seizures, numbness, or tingling. . Denies skin break down or rash.   PE  BP 135/79   Pulse 66   Resp 18   Ht 5\' 7"  (1.702 m)   Wt 164 lb 1.9 oz (74.4 kg)   SpO2 96%   BMI 25.70 kg/m   Patient alert and oriented and in no cardiopulmonary distress.  HEENT: No facial asymmetry, EOMI,     Neck supple .  Chest: Clear to auscultation bilaterally.  CVS: S1, S2 no murmurs, no S3.Regular rate.  ABD: Soft non tender.   Ext: No edema  MS: Adequate ROM spine, shoulders, hips and knees.  Skin: Intact, no ulcerations or rash noted.  Psych: Good eye contact, flat  affect. Memory  intact not anxious mildly  depressed appearing.  CNS: CN 2-12 intact, power,  normal throughout.no focal deficits noted.   Assessment & Plan  Essential hypertension Controlled, no change in medication   Mixed anxiety and depressive disorder Increase zoloft to 100 mg daily  Low serum potassium Updated lab needed    Depression, major, single episode, moderate (HCC) Increase sertraline to 100 mg daily ( two 50 mg tabs) re eval in  8 weeks Continue therapy and allow time for healing  Financial insecurity Resolved now in her own place although seems as though financially there is still a burden, nephew assisting

## 2023-09-29 NOTE — Addendum Note (Signed)
 Addended by: Sandrea Hughs B on: 09/29/2023 09:04 AM   Modules accepted: Orders

## 2023-10-01 ENCOUNTER — Other Ambulatory Visit: Payer: Self-pay | Admitting: Family Medicine

## 2023-10-06 ENCOUNTER — Ambulatory Visit: Payer: Self-pay | Admitting: Licensed Clinical Social Worker

## 2023-10-06 NOTE — Patient Outreach (Signed)
 Complex Care Management   Visit Note  10/06/2023  Name:  Tiffany Mendoza MRN: [id number] DOB: Jan 22, 1951  Situation: Referral received for Complex Care Management related to SDOH Barriers:  Financial Resource Strain I obtained verbal consent from Patient.  Visit completed with C Rod  on the phone  Background:   Past Medical History:  Diagnosis Date   Adjustment disorder with mixed anxiety and depressed mood    Allergy    Carotid bruit    Depression    Dermatophytosis of foot    GERD (gastroesophageal reflux disease)    Hypertension    Insomnia    Neck pain    Osteoporosis     Assessment: Patient Reported Symptoms:  Cognitive        Neurological      HEENT        Cardiovascular      Respiratory      Endocrine      Gastrointestinal        Genitourinary      Integumentary      Musculoskeletal          Psychosocial              09/28/2023    3:03 PM  Depression screen PHQ 2/9  Decreased Interest 3  Down, Depressed, Hopeless 1  PHQ - 2 Score 4  Altered sleeping 0  Tired, decreased energy 3  Change in appetite 1  Feeling bad or failure about yourself  0  Trouble concentrating 1  Moving slowly or fidgety/restless 1  Suicidal thoughts 0  PHQ-9 Score 10  Difficult doing work/chores Somewhat difficult    There were no vitals filed for this visit.  Medications Reviewed Today   Medications were not reviewed in this encounter     Recommendation:   No further recommendation  Follow Up Plan:   Patient has met all care management goals. Care Management case will be closed. Patient has been provided contact information should new needs arise.   Fletcher Humble MSW, LCSW Licensed Clinical Social Worker  [facility], Population Health Direct Dial: [phone]  Fax: [phone]

## 2023-10-06 NOTE — Patient Instructions (Signed)
 Visit Information  Thank you for taking time to visit with me today. Please don't hesitate to contact me if I can be of assistance to you before our next scheduled appointment.   Patient has met all care management goals. Care Management case will be closed. Patient has been provided contact information should new needs arise.   Please call the care guide team at [phone] if you need to cancel, schedule, or reschedule an appointment.   Please call 911 if you are experiencing a Mental Health or Behavioral Health Crisis or need someone to talk to.  Fletcher Humble MSW, LCSW Licensed Clinical Social Worker  [facility], Population Health Direct Dial: [phone]  Fax: [phone]

## 2023-10-08 ENCOUNTER — Encounter (HOSPITAL_COMMUNITY): Payer: Self-pay | Admitting: *Deleted

## 2023-10-08 ENCOUNTER — Emergency Department (HOSPITAL_COMMUNITY)
Admission: EM | Admit: 2023-10-08 | Discharge: 2023-10-09 | Disposition: A | Discharge status: Home / Self Care | Attending: Emergency Medicine | Admitting: Emergency Medicine

## 2023-10-08 ENCOUNTER — Other Ambulatory Visit: Payer: Self-pay

## 2023-10-08 DIAGNOSIS — E559 Vitamin D deficiency, unspecified: Secondary | ICD-10-CM | POA: Insufficient documentation

## 2023-10-08 DIAGNOSIS — E785 Hyperlipidemia, unspecified: Secondary | ICD-10-CM | POA: Diagnosis not present

## 2023-10-08 DIAGNOSIS — Z79899 Other long term (current) drug therapy: Secondary | ICD-10-CM | POA: Insufficient documentation

## 2023-10-08 DIAGNOSIS — I10 Essential (primary) hypertension: Secondary | ICD-10-CM | POA: Diagnosis not present

## 2023-10-08 DIAGNOSIS — R5382 Chronic fatigue, unspecified: Secondary | ICD-10-CM | POA: Insufficient documentation

## 2023-10-08 DIAGNOSIS — R9431 Abnormal electrocardiogram [ECG] [EKG]: Secondary | ICD-10-CM | POA: Diagnosis not present

## 2023-10-08 DIAGNOSIS — R5383 Other fatigue: Secondary | ICD-10-CM | POA: Insufficient documentation

## 2023-10-08 LAB — CBC WITH DIFFERENTIAL/PLATELET
Abs Immature Granulocytes: 0.02 10*3/uL (ref 0.00–0.07)
Basophils Absolute: 0 10*3/uL (ref 0.0–0.1)
Basophils Relative: 1 %
Eosinophils Absolute: 0.2 10*3/uL (ref 0.0–0.5)
Eosinophils Relative: 3 %
HCT: 39.4 % (ref 36.0–46.0)
Hemoglobin: 13.2 g/dL (ref 12.0–15.0)
Immature Granulocytes: 0 %
Lymphocytes Relative: 31 %
Lymphs Abs: 1.9 10*3/uL (ref 0.7–4.0)
MCH: 32.2 pg (ref 26.0–34.0)
MCHC: 33.5 g/dL (ref 30.0–36.0)
MCV: 96.1 fL (ref 80.0–100.0)
Monocytes Absolute: 0.5 10*3/uL (ref 0.1–1.0)
Monocytes Relative: 7 %
Neutro Abs: 3.6 10*3/uL (ref 1.7–7.7)
Neutrophils Relative %: 58 %
Platelets: 223 10*3/uL (ref 150–400)
RBC: 4.1 MIL/uL (ref 3.87–5.11)
RDW: 12.3 % (ref 11.5–15.5)
WBC: 6.2 10*3/uL (ref 4.0–10.5)
nRBC: 0 % (ref 0.0–0.2)

## 2023-10-08 LAB — COMPREHENSIVE METABOLIC PANEL WITH GFR
ALT: 27 U/L (ref 0–44)
AST: 29 U/L (ref 15–41)
Albumin: 3.9 g/dL (ref 3.5–5.0)
Alkaline Phosphatase: 39 U/L (ref 38–126)
Anion gap: 13 (ref 5–15)
BUN: 12 mg/dL (ref 8–23)
CO2: 24 mmol/L (ref 22–32)
Calcium: 9.6 mg/dL (ref 8.9–10.3)
Chloride: 101 mmol/L (ref 98–111)
Creatinine, Ser: 0.79 mg/dL (ref 0.44–1.00)
GFR, Estimated: >60 mL/min (ref >60)
Glucose, Bld: 117 mg/dL — ABNORMAL HIGH (ref 70–99)
Potassium: 3.6 mmol/L (ref 3.5–5.1)
Sodium: 138 mmol/L (ref 135–145)
Total Bilirubin: 0.6 mg/dL (ref 0.0–1.2)
Total Protein: 7.3 g/dL (ref 6.5–8.1)

## 2023-10-08 LAB — MAGNESIUM: Magnesium: 2.2 mg/dL (ref 1.7–2.4)

## 2023-10-08 NOTE — ED Provider Notes (Signed)
 [facility] Provider Note   CSN: [id number] Arrival date & time: 10/08/23  1525     History {Add pertinent medical, surgical, social history, OB history to HPI:1} Chief Complaint  Patient presents with   Abnormal Lab    Tiffany Mendoza is a 73 y.o. female.  The history is provided by the patient and medical records.  Abnormal Lab  73 y.o. F with hx of depression, HTN, hyperlipidemia, vit d deficiency, presenting to the ED for chronic fatigue.  Difficulty to ascertain what prompted her ED visit today as she is continually talking about things from 2-3 years ago.  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  Sounds like she still has a lot of grief around this.  After that she had to sell her house and move into an apartment which has been a difficult transition for her.  States she just feels fatigued all the time but nothing really seems to have changed over the past few months.  She tries to eat and rest but does not get any better.  She has been following with her primary care doctor about this and was started on an antidepressant and OP therapy.  She denies any chest pain, SOB, abdominal pain, N/V/D.  Home Medications Prior to Admission medications   Medication Sig Start Date End Date Taking? Authorizing Provider  clorazepate  (TRANXENE -T) 7.5 MG tablet Take 1 tablet (7.5 mg total) by mouth 2 (two) times daily as needed for anxiety. 04/14/23   Towanda Fret, MD  hydrochlorothiazide  (HYDRODIURIL ) 25 MG tablet Take 1 tablet (25 mg total) by mouth daily. 09/19/23   Towanda Fret, MD  meclizine  (ANTIVERT ) 12.5 MG tablet Take 1 tablet (12.5 mg total) by mouth 3 (three) times daily as needed for dizziness. Patient not taking: Reported on 09/28/2023 08/27/23   Tonya Fredrickson, MD  potassium chloride  (KLOR-CON  M) 10 MEQ tablet Take 1 tablet (10 mEq total) by mouth 2 (two) times daily.  08/31/23   Towanda Fret, MD  potassium chloride  (KLOR-CON ) 10 MEQ tablet Take 10 mEq by mouth 2 (two) times daily. 09/05/23   [provider]  sertraline  (ZOLOFT ) 50 MG tablet TAKE 1 & 1/2 (ONE & ONE-HALF) TABLETS BY MOUTH ONCE DAILY 09/19/23   Towanda Fret, MD  sertraline  (ZOLOFT ) 50 MG tablet Take two tablets by mouth daily 09/28/23   Towanda Fret, MD      Allergies    Patient has no known allergies.    Review of Systems   Review of Systems  Constitutional:  Positive for fatigue.  All other systems reviewed and are negative.   Physical Exam Updated Vital Signs BP (!) 158/98 (BP Location: Right Arm)   Pulse 65   Temp (!) 97.5 F (36.4 C) (Oral)   Resp 16   Ht 5\' 7"  (1.702 m)   Wt 74.4 kg   SpO2 100%   BMI 25.69 kg/m   Physical Exam Vitals and nursing note reviewed.  Constitutional:      Appearance: She is well-developed.     Comments: Rambling, very hard to stay on topic, required redirection multiple times  HENT:     Head: Normocephalic and atraumatic.  Eyes:     Conjunctiva/sclera: Conjunctivae normal.     Pupils: Pupils are equal, round, and reactive to light.  Cardiovascular:     Rate and Rhythm: Normal rate and regular  rhythm.     Heart sounds: Normal heart sounds.  Pulmonary:     Effort: Pulmonary effort is normal.     Breath sounds: Normal breath sounds.  Abdominal:     General: Bowel sounds are normal.     Palpations: Abdomen is soft.  Musculoskeletal:        General: Normal range of motion.     Cervical back: Normal range of motion.  Skin:    General: Skin is warm and dry.  Neurological:     Mental Status: She is alert and oriented to person, place, and time.     ED Results / Procedures / Treatments   Labs (all labs ordered are listed, but only abnormal results are displayed) Labs Reviewed  COMPREHENSIVE METABOLIC PANEL WITH GFR - Abnormal; Notable for the following components:      Result Value   Glucose, Bld 117 (*)     All other components within normal limits  CBC WITH DIFFERENTIAL/PLATELET  MAGNESIUM    EKG EKG Interpretation Date/Time:  Sunday October 08 2023 22:27:39 EDT Ventricular Rate:  60 PR Interval:  169 QRS Duration:  89 QT Interval:  421 QTC Calculation: 421 R Axis:   -18  Text Interpretation: Sinus rhythm Supraventricular bigeminy Borderline left axis deviation Confirmed by Lowery Rue [id number]) on 10/08/2023 10:31:21 PM  Radiology No results found.  Procedures Procedures  {Document cardiac monitor, telemetry assessment procedure when appropriate:1}  Medications Ordered in ED Medications - No data to display  ED Course/ Medical Decision Making/ A&P   {   Click here for ABCD2, HEART and other calculatorsREFRESH Note before signing :1}                              Medical Decision Making  73 year old female presenting to the ED with chronic fatigue.  Ongoing for several months now.  She is not really able to tell me anything that has been different recently prompting this ED visit.  Was concerned about her potassium possibly being low again.  She is afebrile and nontoxic.  Vitals are stable.  Labs today are all pristine.  She has had recent TSH testing that was also normal with PCP.   Long disucssion with patient that this is a chronic problem and will need onoing outpatient work-up/follow-up but does not appear to be anything acute to address today.  Encouraged to follow-up with PCP.  Can return here for new concerns.  Final Clinical Impression(s) / ED Diagnoses Final diagnoses:  Chronic fatigue    Rx / DC Orders ED Discharge Orders     None

## 2023-10-08 NOTE — ED Notes (Signed)
 Pt concerned about her constant fatigue and would like to discuss this with provider.

## 2023-10-08 NOTE — ED Triage Notes (Signed)
 Anxiety and depression  death of her mother 3 months ago  had to move from her  house and move to an apartment and numerus other problems   she was told that her potassium several weeks ago  was low she doubled her potassium as she was told to do  now for 3-4 days she thinks that she read the instructions wrong and she was supposed  to take 1 a day  many other concersn also

## 2023-10-08 NOTE — Discharge Instructions (Addendum)
 Your labs were all normal today. You need to follow-up with Dr. Rodolph Clap about your ongoing fatigue. Return here for new concerns.

## 2023-10-08 NOTE — ED Provider Triage Note (Signed)
 Emergency Medicine Provider Triage Evaluation Note  Tiffany Mendoza , a 73 y.o. female  was evaluated in triage.  Pt complains of fatigue.  Patient reports ongoing issues with fatigue for the last several months that she has been attributing to the passing of her mother.  She denies any specific concerns for bleeding such as bright red blood per rectum, hematemesis, and is on blood thinners.  She states that she has been on a potassium supplement for some time but is unsure if she can take this correctly.  Review of Systems  Positive: As above Negative: As above  Physical Exam  BP 135/75 (BP Location: Right Arm)   Pulse 96   Temp 98.4 F (36.9 C)   Resp 18   Ht 5\' 7"  (1.702 m)   Wt 74.4 kg   SpO2 99%   BMI 25.69 kg/m  Gen:   Awake, no distress   Resp:  Normal effort  MSK:   Moves extremities without difficulty  Other:    Medical Decision Making  Medically screening exam initiated at 5:15 PM.  Appropriate orders placed.  Tiffany Mendoza was informed that the remainder of the evaluation will be completed by another provider, this initial triage assessment does not replace that evaluation, and the importance of remaining in the ED until their evaluation is complete.     Joyel Chenette A, PA-C 10/08/23 1715

## 2023-10-09 ENCOUNTER — Other Ambulatory Visit: Payer: Self-pay | Admitting: *Deleted

## 2023-10-09 ENCOUNTER — Other Ambulatory Visit: Payer: Self-pay

## 2023-10-09 ENCOUNTER — Encounter: Payer: Self-pay | Admitting: *Deleted

## 2023-10-09 NOTE — Patient Instructions (Signed)
 Visit Information  Thank you for taking time to visit with me today. Please don't hesitate to contact me if I can be of assistance to you before our next scheduled appointment.  Your next care management appointment is by telephone on 11-06-2023 at 2:30 pm  Telephone follow-up in 1 month  Please call the care guide team at [phone] if you need to cancel, schedule, or reschedule an appointment.   Please call the Suicide and Crisis Lifeline: 988 call the [place]  National Suicide Prevention Lifeline: 540-396-1305 or TTY: 308-673-5552 TTY 2670342492) to talk to a trained counselor call 1-800-273-TALK (toll free, 24 hour hotline) go to [facility] [address], [place] [phone]) call 911 if you are experiencing a Mental Health or Behavioral Health Crisis or need someone to talk to.  Tiffany Mendoza, BSN RN [facility], [facility] RN Care Manager Direct Dial: [phone]  Fax: [phone]  Advance Directive  Advance directives are legal papers that state your wishes about health care decisions. They let your wishes be known to family, friends, and health care providers if you become unable to speak for yourself.  You should write these papers out over time rather than all at once. They can be changed and updated at any time. The types of advance directives include: Medical power of attorney (POA). Living wills. Do not resuscitate (DNR) or do not attempt resuscitation (DNAR) orders. What are a health care proxy and medical POA? A health care proxy is also called a health care agent. It's a person you choose to make medical decisions for you when you can't make them for yourself. In most cases, a proxy is a trusted friend or family member. A medical POA is legal paperwork that names your proxy. It may need to be: Signed. Notarized. Dated. Copied. Witnessed. Added to your medical record. You may also  want to choose someone to handle your money if you can't do so. This is called a durable POA for finances. It's separate from a medical POA. You may choose your health care proxy or someone else to act as your agent in money matters. If you don't have a proxy, or if the proxy may not be acting in your best interest, a court may choose a guardian to act on your behalf. What is a living will? A living will is legal paperwork that states your wishes about medical care. Providers should keep a copy of it in your medical record. You may want to give a copy to family members or friends. You can also keep a card in your wallet to let loved ones know you have a living will and where they can find it. A living will may be used if: You're very sick with something that will end your life. You become disabled. You can't make decisions or speak for yourself. Your living will should include whether: To use or not use life support equipment. This may include machines to filter your blood or to help you breathe. You want a DNR or DNAR order. This tells providers not to use CPR if your heart or breathing stops. To use or not use tube feeding. You want to be given foods and fluids. You want a type of comfort care called palliative care. This may be given when the goal for treatment becomes comfort rather than a cure. You want to donate your organs and tissues. A living will doesn't say what to do with your money and  property if you pass away. What is a DNR or DNAR? A DNR or DNAR order is a request not to have CPR. If you don't have one of these orders, a provider will try to help you if your heart stops or you stop breathing.  If you plan to have surgery, talk with your provider about your DNR or DNAR order. What happens if I don't have an advance directive? Each state has its own laws about advance directives. Some states assign family decision makers to act on your behalf if you don't have an advance directive.   Check with your provider, attorney, or state representative about the laws in your state. Where to find more information Each state has its own laws about advance directives. You can look up these laws at: https://rodriguez-phillips.com/ This information is not intended to replace advice given to you by your health care provider. Make sure you discuss any questions you have with your health care provider. Document Revised: 10/24/2022 Document Reviewed: 10/24/2022 Elsevier Patient Education  2024 Elsevier Inc.  DASH Eating Plan DASH stands for Dietary Approaches to Stop Hypertension. The DASH eating plan is a healthy eating plan that has been shown to: Lower high blood pressure (hypertension). Reduce your risk for type 2 diabetes, heart disease, and stroke. Help with weight loss. What are tips for following this plan? Reading food labels Check food labels for the amount of salt (sodium) per serving. Choose foods with less than 5 percent of the Daily Value (DV) of sodium. In general, foods with less than 300 milligrams (mg) of sodium per serving fit into this eating plan. To find whole grains, look for the word "whole" as the first word in the ingredient list. Shopping Buy products labeled as "low-sodium" or "no salt added." Buy fresh foods. Avoid canned foods and pre-made or frozen meals. Cooking Try not to add salt when you cook. Use salt-free seasonings or herbs instead of table salt or sea salt. Check with your health care provider or pharmacist before using salt substitutes. Do not fry foods. Cook foods in healthy ways, such as baking, boiling, grilling, roasting, or broiling. Cook using oils that are good for your heart. These include olive, canola, avocado, soybean, and sunflower oil. Meal planning  Eat a balanced diet. This should include: 4 or more servings of fruits and 4 or more servings of vegetables each day. Try to fill half of your plate with fruits and vegetables. 6-8 servings of whole grains  each day. 6 or less servings of lean meat, poultry, or fish each day. 1 oz is 1 serving. A 3 oz (85 g) serving of meat is about the same size as the palm of your hand. One egg is 1 oz (28 g). 2-3 servings of low-fat dairy each day. One serving is 1 cup (237 mL). 1 serving of nuts, seeds, or beans 5 times each week. 2-3 servings of heart-healthy fats. Healthy fats called omega-3 fatty acids are found in foods such as walnuts, flaxseeds, fortified milks, and eggs. These fats are also found in cold-water fish, such as sardines, salmon, and mackerel. Limit how much you eat of: Canned or prepackaged foods. Food that is high in trans fat, such as fried foods. Food that is high in saturated fat, such as fatty meat. Desserts and other sweets, sugary drinks, and other foods with added sugar. Full-fat dairy products. Do not salt foods before eating. Do not eat more than 4 egg yolks a week. Try to eat at  least 2 vegetarian meals a week. Eat more home-cooked food and less restaurant, buffet, and fast food. Lifestyle When eating at a restaurant, ask if your food can be made with less salt or no salt. If you drink alcohol: Limit how much you have to: 0-1 drink a day if you are female. 0-2 drinks a day if you are female. Know how much alcohol is in your drink. In the [place] one drink is one 12 oz bottle of beer (355 mL), one 5 oz glass of wine (148 mL), or one 1 oz glass of hard liquor (44 mL). General information Avoid eating more than 2,300 mg of salt a day. If you have hypertension, you may need to reduce your sodium intake to 1,500 mg a day. Work with your provider to stay at a healthy body weight or lose weight. Ask what the best weight range is for you. On most days of the week, get at least 30 minutes of exercise that causes your heart to beat faster. This may include walking, swimming, or biking. Work with your provider or dietitian to adjust your eating plan to meet your specific calorie  needs. What foods should I eat? Fruits All fresh, dried, or frozen fruit. Canned fruits that are in their natural juice and do not have sugar added to them. Vegetables Fresh or frozen vegetables that are raw, steamed, roasted, or grilled. Low-sodium or reduced-sodium tomato and vegetable juice. Low-sodium or reduced-sodium tomato sauce and tomato paste. Low-sodium or reduced-sodium canned vegetables. Grains Whole-grain or whole-wheat bread. Whole-grain or whole-wheat pasta. Brown rice. Dwyane Glad. Bulgur. Whole-grain and low-sodium cereals. Pita bread. Low-fat, low-sodium crackers. Whole-wheat flour tortillas. Meats and other proteins Skinless chicken or [place]. Ground chicken or [place]. Pork with fat trimmed off. Fish and seafood. Egg whites. Dried beans, peas, or lentils. Unsalted nuts, nut butters, and seeds. Unsalted canned beans. Lean cuts of beef with fat trimmed off. Low-sodium, lean precooked or cured meat, such as sausages or meat loaves. Dairy Low-fat (1%) or fat-free (skim) milk. Reduced-fat, low-fat, or fat-free cheeses. Nonfat, low-sodium ricotta or cottage cheese. Low-fat or nonfat yogurt. Low-fat, low-sodium cheese. Fats and oils Soft margarine without trans fats. Vegetable oil. Reduced-fat, low-fat, or light mayonnaise and salad dressings (reduced-sodium). Canola, safflower, olive, avocado, soybean, and sunflower oils. Avocado. Seasonings and condiments Herbs. Spices. Seasoning mixes without salt. Other foods Unsalted popcorn and pretzels. Fat-free sweets. The items listed above may not be all the foods and drinks you can have. Talk to a dietitian to learn more. What foods should I avoid? Fruits Canned fruit in a light or heavy syrup. Fried fruit. Fruit in cream or butter sauce. Vegetables Creamed or fried vegetables. Vegetables in a cheese sauce. Regular canned vegetables that are not marked as low-sodium or reduced-sodium. Regular canned tomato sauce and paste that are  not marked as low-sodium or reduced-sodium. Regular tomato and vegetable juices that are not marked as low-sodium or reduced-sodium. Vanessa General. Olives. Grains Baked goods made with fat, such as croissants, muffins, or some breads. Dry pasta or rice meal packs. Meats and other proteins Fatty cuts of meat. Ribs. Fried meat. Helene Loader. Bologna, salami, and other precooked or cured meats, such as sausages or meat loaves, that are not lean and low in sodium. Fat from the back of a pig (fatback). Bratwurst. Salted nuts and seeds. Canned beans with added salt. Canned or smoked fish. Whole eggs or egg yolks. Chicken or [place] with skin. Dairy Whole or 2% milk, cream, and half-and-half. Whole  or full-fat cream cheese. Whole-fat or sweetened yogurt. Full-fat cheese. Nondairy creamers. Whipped toppings. Processed cheese and cheese spreads. Fats and oils Butter. Stick margarine. Lard. Shortening. Ghee. Bacon fat. Tropical oils, such as coconut, palm kernel, or palm oil. Seasonings and condiments Onion salt, garlic salt, seasoned salt, table salt, and sea salt. Worcestershire sauce. Tartar sauce. Barbecue sauce. Teriyaki sauce. Soy sauce, including reduced-sodium soy sauce. Steak sauce. Canned and packaged gravies. Fish sauce. Oyster sauce. Cocktail sauce. Store-bought horseradish. Ketchup. Mustard. Meat flavorings and tenderizers. Bouillon cubes. Hot sauces. Pre-made or packaged marinades. Pre-made or packaged taco seasonings. Relishes. Regular salad dressings. Other foods Salted popcorn and pretzels. The items listed above may not be all the foods and drinks you should avoid. Talk to a dietitian to learn more. Where to find more information National Heart, Lung, and Blood Institute (NHLBI): BuffaloDryCleaner.gl American Heart Association (AHA): heart.org Academy of Nutrition and Dietetics: eatright.org National Kidney Foundation (NKF): kidney.org This information is not intended to replace advice given to you by your  health care provider. Make sure you discuss any questions you have with your health care provider. Document Revised: 06/23/2022 Document Reviewed: 06/23/2022 Elsevier Patient Education  2024 [employer].

## 2023-10-09 NOTE — Patient Outreach (Signed)
 Complex Care Management   Visit Note  10/09/2023  Name:  Tiffany Mendoza MRN: [id number] DOB: [birth year]  Situation: Referral received for Complex Care Management related to  HTN  I obtained verbal consent from Patient.  Visit completed with patient  on the phone  Background:   Past Medical History:  Diagnosis Date   Adjustment disorder with mixed anxiety and depressed mood    Allergy    Carotid bruit    Depression    Dermatophytosis of foot    GERD (gastroesophageal reflux disease)    Hypertension    Insomnia    Neck pain    Osteoporosis     Assessment: Patient Reported Symptoms:  Cognitive Cognitive Status: Alert and oriented to person, place, and time Cognitive/Intellectual Conditions Management [RPT]: None reported or documented in medical history or problem list   Health Maintenance Behaviors: Annual physical exam Healing Pattern: Average Health Facilitated by: Rest  Neurological Neurological Review of Symptoms: No symptoms reported Neurological Management Strategies: Routine screening Neurological Self-Management Outcome: 3 (uncertain)  HEENT HEENT Symptoms Reported: No symptoms reported HEENT Management Strategies: Routine screening HEENT Self-Management Outcome: 4 (good)    Cardiovascular Cardiovascular Symptoms Reported: No symptoms reported Does patient have uncontrolled Hypertension?: No Cardiovascular Conditions: Hypertension Cardiovascular Management Strategies: Routine screening, Medication therapy Cardiovascular Self-Management Outcome: 4 (good)  Respiratory Respiratory Symptoms Reported: No symptoms reported Respiratory Self-Management Outcome: 4 (good)  Endocrine Patient reports the following symptoms related to hypoglycemia or hyperglycemia : No symptoms reported Is patient diabetic?: No Endocrine Management Strategies: Routine screening Endocrine Self-Management Outcome: 4 (good)  Gastrointestinal Gastrointestinal Symptoms Reported: No symptoms  reported Gastrointestinal Self-Management Outcome: 4 (good) Nutrition Risk Screen (CP): No indicators present  Genitourinary Genitourinary Symptoms Reported: No symptoms reported Genitourinary Self-Management Outcome: 4 (good)  Integumentary Integumentary Symptoms Reported: No symptoms reported Skin Self-Management Outcome: 4 (good)  Musculoskeletal Musculoskelatal Symptoms Reviewed: No symptoms reported Musculoskeletal Self-Management Outcome: 4 (good) Falls in the past year?: No Number of falls in past year: 1 or less Was there an injury with Fall?: No Fall Risk Category Calculator: 0 Patient Fall Risk Level: Low Fall Risk Patient at Risk for Falls Due to: No Fall Risks Fall risk Follow up: Falls evaluation completed, Education provided, Falls prevention discussed  Psychosocial Psychosocial Symptoms Reported: Depression - if selected complete PHQ 2-9 Behavioral Health Conditions: Depression Behavioral Management Strategies: Counseling Behavioral Health Self-Management Outcome: 4 (good) Major Change/Loss/Stressor/Fears (CP): Death of a loved one Techniques to Cope with Loss/Stress/Change: Counseling Quality of Family Relationships: disruptive Do you feel physically threatened by others?: No      10/09/2023    9:32 AM  Depression screen PHQ 2/9  Decreased Interest 3  Down, Depressed, Hopeless 1  PHQ - 2 Score 4  Altered sleeping 0  Tired, decreased energy 3  Change in appetite 1  Feeling bad or failure about yourself  0  Trouble concentrating 1  Moving slowly or fidgety/restless 1  Suicidal thoughts 0  PHQ-9 Score 10  Difficult doing work/chores Somewhat difficult    There were no vitals filed for this visit.  Medications Reviewed Today     Reviewed by Remona Carmel, RN (Registered Nurse) on 10/09/23 at 0920  Med List Status: <None>   Medication Order Taking? Sig Documenting Provider Last Dose Status Informant  clorazepate  (TRANXENE -T) 7.5 MG tablet [id number] Yes  Take 1 tablet (7.5 mg total) by mouth 2 (two) times daily as needed for anxiety. Towanda Fret, MD Taking Active Self, Pharmacy Records  hydrochlorothiazide  (HYDRODIURIL ) 25 MG tablet [id number] Yes Take 1 tablet (25 mg total) by mouth daily. Towanda Fret, MD Taking Active   meclizine  (ANTIVERT ) 12.5 MG tablet [id number] No Take 1 tablet (12.5 mg total) by mouth 3 (three) times daily as needed for dizziness.  Patient not taking: Reported on 10/09/2023   Tonya Fredrickson, MD Not Taking Consider Medication Status and Discontinue   potassium chloride  (KLOR-CON  M) 10 MEQ tablet [id number] Yes Take 1 tablet (10 mEq total) by mouth 2 (two) times daily. Towanda Fret, MD Taking Active            Med Note Oletta Berry, Geron Mulford M   Mon Oct 09, 2023  9:19 AM) Patient taking differently. Taking 2 tablets once daily.  potassium chloride  (KLOR-CON ) 10 MEQ tablet [id number] No Take 10 mEq by mouth 2 (two) times daily.  Patient not taking: Reported on 10/09/2023   [provider] Not Taking Consider Medication Status and Discontinue   sertraline  (ZOLOFT ) 50 MG tablet [id number] No TAKE 1 & 1/2 (ONE & ONE-HALF) TABLETS BY MOUTH ONCE DAILY  Patient not taking: Reported on 10/09/2023   Towanda Fret, MD Not Taking Consider Medication Status and Discontinue   sertraline  (ZOLOFT ) 50 MG tablet [id number] Yes Take two tablets by mouth daily Towanda Fret, MD Taking Active             Recommendation:   PCP Follow-up  Follow Up Plan:   Telephone follow up appointment date/time:  11-06-2023 at 2:30 pm  Grandville Lax, BSN RN [facility], [facility] RN Care Manager Direct Dial: [phone]  Fax: [phone]

## 2023-10-10 ENCOUNTER — Telehealth: Payer: Self-pay | Admitting: Family Medicine

## 2023-10-10 ENCOUNTER — Ambulatory Visit: Payer: Self-pay

## 2023-10-10 NOTE — Telephone Encounter (Signed)
 Chief Complaint: worsening depression Symptoms: anxiety, depression, feelings of hopelessness, fatigue Frequency: x weeks Pertinent Negatives: Patient denies thoughts or plans to harm herself or others Disposition: [] ED /[] Urgent Care (no appt availability in office) / [] Appointment(In office/virtual)/ []  [place] [facility]/ [] Home Care/ [] Refused Recommended Disposition /[] [place] [facility]/ [x]  Follow-up with PCP Additional Notes: Patient states she has had her sertraline  increased on 09/28/23 by Dr Rodolph Clap. Patient seen in ED on Sunday for symptoms. She states she feels like she just needs more advice on what to do. Patient states in the past she saw Delrae Field with [facility]. Unable to access his scheduling at this time and patient requested a call back to be scheduled with him. Advised patient also of the 24/7 Behavioral Health Urgent Care resource in [place].  Copied from CRM [id number]. Topic: Clinical - Red Word Triage >> Oct 10, 2023  2:00 PM Antwanette L wrote: [facility] that prompted transfer to Nurse Triage: Depression Reason for Disposition  [1] Depression AND [2] worsening (e.g., sleeping poorly, less able to do activities of daily living)  Answer Assessment - Initial Assessment Questions 1. CONCERN: "What happened that made you call today?"     She states she feels like the depression is worsening. She states she had a tele visit on Monday and she told the nurse her motivation is low.  2. DEPRESSION SYMPTOM SCREENING: "How are you feeling overall?" (e.g., decreased energy, increased sleeping or difficulty sleeping, difficulty concentrating, feelings of sadness, guilt, hopelessness, or worthlessness)     Decreased energy, anxiety, hopelessness.  3. RISK OF HARM - SUICIDAL IDEATION:  "Do you ever have thoughts of hurting or killing yourself?"  (e.g., yes, no, no but preoccupation with thoughts about death)   - INTENT:  "Do you have thoughts of hurting or  killing yourself right NOW?" (e.g., yes, no, N/A)   - PLAN: "Do you have a specific plan for how you would do this?" (e.g., gun, knife, overdose, no plan, N/A)     Denies.  4. RISK OF HARM - HOMICIDAL IDEATION:  "Do you ever have thoughts of hurting or killing someone else?"  (e.g., yes, no, no but preoccupation with thoughts about death)   - INTENT:  "Do you have thoughts of hurting or killing someone right NOW?" (e.g., yes, no, N/A)   - PLAN: "Do you have a specific plan for how you would do this?" (e.g., gun, knife, no plan, N/A)      Denies.  5. FUNCTIONAL IMPAIRMENT: "How have things been going for you overall? 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 She states she doesn't show her emotions much just feels like there is loneliness and she needs to connect to something or someone.  6. SUPPORT: "Who is with you now?" "Who do you live with?" "Do you have family or friends who you can talk to?"      She states she lives at home. She states she is looking to join a church and she has 4 girlfriends who check on her. But she states she has a lot of alone time.  7. THERAPIST: "Do you have a counselor or therapist? Name?"     In the past she has seen Delrae Field.  8. STRESSORS: "Has there been any new stress or recent changes in your life?"  She had a family member pass away and had to move into an apartment, mentions she is not talking to her 2 sister, she also states she has been to the ED twice in the past month and was diagnosed with low potassium. 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  9. ALCOHOL USE OR SUBSTANCE USE (DRUG USE): "Do you drink alcohol or use any illegal drugs?"      Denies.  10. OTHER: "Do you have any other physical symptoms right now?" (e.g., fever)       Fatigue.  11. PREGNANCY: "Is there any chance you are pregnant?" "When was your last menstrual period?"       N/A.  Protocols used: Depression-A-AH

## 2023-10-10 NOTE — Telephone Encounter (Signed)
 Attempted to reach patient to schedule with Tiffany Mendoza- LVM with number provided in previous message

## 2023-10-12 ENCOUNTER — Telehealth: Payer: Self-pay

## 2023-10-12 NOTE — Telephone Encounter (Signed)
 Scheduled 5/1.

## 2023-10-12 NOTE — Telephone Encounter (Signed)
 Copied from CRM [id number]. Topic: Appointments - Scheduling Inquiry for Clinic >> Oct 11, 2023  4:30 PM DeAngela L wrote: Reason for CRM: Patient returning call for Dr Rodolph Clap to help her get in contact for the counseling and scheduled with Polly Brink  Patients call back number [phone] (M)

## 2023-10-13 ENCOUNTER — Ambulatory Visit: Payer: Self-pay | Admitting: Family Medicine

## 2023-10-17 ENCOUNTER — Ambulatory Visit: Payer: Self-pay

## 2023-10-17 VITALS — BP 115/74 | HR 87 | Ht 67.0 in | Wt 164.1 lb

## 2023-10-17 DIAGNOSIS — F418 Other specified anxiety disorders: Secondary | ICD-10-CM | POA: Diagnosis not present

## 2023-10-17 MED ORDER — SERTRALINE HCL 50 MG PO TABS: ORAL_TABLET | ORAL | Status: DC

## 2023-10-17 NOTE — Progress Notes (Unsigned)
   Established Patient Office Visit  Subjective   Patient ID: Tiffany Mendoza, female    DOB: 08-02-1950  Age: 73 y.o. MRN: [id number]  Chief Complaint  Patient presents with   Medical Management of Chronic Issues    Pt states " Issues with Sertraline , Pt wants to talk about medication change her dose to 75mg  instead of 100mg "    HPI Here to discuss recent changes made to sertraline .    Past Medical History:  Diagnosis Date   Adjustment disorder with mixed anxiety and depressed mood    Allergy    Carotid bruit    Depression    Dermatophytosis of foot    GERD (gastroesophageal reflux disease)    Hypertension    Insomnia    Neck pain    Osteoporosis    Past Surgical History:  Procedure Laterality Date   ABDOMINAL HYSTERECTOMY  1988   HEMORRHOID SURGERY     myoectomy  1984      ROS    Objective:     BP 115/74   Pulse 87   Ht 5\' 7"  (1.702 m)   Wt 164 lb 1.3 oz (74.4 kg)   SpO2 96%   BMI 25.70 kg/m  {Vitals History (Optional):23777}  Physical Exam   No results found for any visits on 10/17/23.  {Labs (Optional):23779}  The 10-year ASCVD risk score (Arnett DK, et al., 2019) is: 10.9%    Assessment & Plan:   Problem List Items Addressed This Visit   None   No follow-ups on file.    Alison Irvine, FNP

## 2023-10-18 NOTE — Assessment & Plan Note (Signed)
 Recommend decreasing dose of sertraline  back down to 75 mg based on the reports of increased anxiety at the 100 mg dose.  Advised to watch for worsening of anxiety and/or depression.  Recommend behavioral health consult if she feels like her symptoms are not improving.

## 2023-10-19 ENCOUNTER — Ambulatory Visit: Admitting: Professional Counselor

## 2023-10-20 ENCOUNTER — Telehealth: Payer: Self-pay

## 2023-10-20 ENCOUNTER — Ambulatory Visit (INDEPENDENT_AMBULATORY_CARE_PROVIDER_SITE_OTHER): Admitting: Professional Counselor

## 2023-10-20 DIAGNOSIS — F418 Other specified anxiety disorders: Secondary | ICD-10-CM

## 2023-10-20 NOTE — Telephone Encounter (Signed)
 Pt stopped by the office to let you know she has been experiencing tingling in here left arm since starting the Potassium medication in March. States it is not painful and the tingling comes and goes. States she did not experience this when taking otc potassium. Wants to know your thoughts and is this could be a s/e from the Klor-Con ? Please advise

## 2023-10-20 NOTE — Patient Instructions (Addendum)
 [facility]   [facility] [place], [place] [address]  [phone]

## 2023-10-20 NOTE — BH Specialist Note (Unsigned)
 [place] [facility] Telephone Follow-up  MRN: [id number] NAME: CLISTA MERCY Date: 10/20/23  Start time: Start Time: 0200 End time: Stop Time: 0230 Total time: Total Time in Minutes (Visit): 30 Call number: Visit Number: 3- Third Visit  Reason for call today:  The patient is a 73 year old female who presented for a collaborative care follow-up after several months since her last session. The delay in care was due to recent health issues, which required her to cancel a previously scheduled appointment. The primary focus of this session was to review the findings and recommendations from her psychiatric consultation and the collaborative care team.  Following evaluation, it was determined that the patient would be best served by transitioning to traditional outpatient therapy, based on her current symptom profile and treatment needs. The patient is open to this recommendation and expressed interest in initiating care with a new provider, as she has recently relocated to [place].  In response, the behavioral health counselor provided the patient with a referral to a therapist in her new area who aligns with her preferences and clinical needs. With the patient now connected to appropriate care, the collaborative care team will sign off on this case at this time.   PHQ-9 Scores:     10/20/2023    2:10 PM 10/17/2023    3:36 PM 10/09/2023    9:32 AM 09/28/2023    3:03 PM 07/21/2023    2:53 PM  Depression screen PHQ 2/9  Decreased Interest 1 3 3 3 1   Down, Depressed, Hopeless 1 1 1 1 1   PHQ - 2 Score 2 4 4 4 2   Altered sleeping 0 0 0 0 1  Tired, decreased energy 1 3 3 3 1   Change in appetite 1 1 1 1  0  Feeling bad or failure about yourself  0 0 0 0 0  Trouble concentrating 0 1 1 1 1   Moving slowly or fidgety/restless 0 1 1 1  0  Suicidal thoughts 0 0 0 0 0  PHQ-9 Score 4 10 10 10 5   Difficult doing work/chores Somewhat difficult Somewhat difficult Somewhat difficult Somewhat difficult  Somewhat difficult   GAD-7 Scores:     10/20/2023    2:11 PM 10/17/2023    3:37 PM 09/28/2023    3:04 PM 07/21/2023    2:54 PM  GAD 7 : Generalized Anxiety Score  Nervous, Anxious, on Edge 1 1 1 1   Control/stop worrying 0 1 1 1   Worry too much - different things 1 1 1 1   Trouble relaxing 0 0 0 1  Restless 0  0 0  Easily annoyed or irritable 0 0 0 0  Afraid - awful might happen 0 0 0 1  Total GAD 7 Score 2  3 5   Anxiety Difficulty Somewhat difficult Somewhat difficult Somewhat difficult Not difficult at all    Stress   Current stressors:  adjusting to move Sleep:  Good Appetite:  Fair Coping ability:  Good Patient taking medications as prescribed:  Yes  Current medications:  Outpatient Encounter Medications as of 10/20/2023  Medication Sig   clorazepate  (TRANXENE -T) 7.5 MG tablet Take 1 tablet (7.5 mg total) by mouth 2 (two) times daily as needed for anxiety.   hydrochlorothiazide  (HYDRODIURIL ) 25 MG tablet Take 1 tablet (25 mg total) by mouth daily.   potassium chloride  (KLOR-CON  M) 10 MEQ tablet Take 1 tablet (10 mEq total) by mouth 2 (two) times daily.   sertraline  (ZOLOFT ) 50 MG tablet TAKE 1 & 1/2 (  ONE & ONE-HALF) TABLETS BY MOUTH ONCE DAILY   No facility-administered encounter medications on file as of 10/20/2023.     Self-harm Behaviors Risk Assessment Self-harm risk factors:  Denies Patient endorses recent thoughts of harming self:  Denies   Danger to Others Risk Assessment Danger to others risk factors:  None Patient endorses recent thoughts of harming others:  Denies   Substance Use Assessment Patient recently consumed alcohol:  Denies  Alcohol Use Disorder Identification Test (AUDIT):     12/11/2017   12:24 PM 05/07/2020    3:13 PM 12/16/2020   11:50 AM 01/11/2022    3:32 PM 01/16/2023    2:03 PM 06/18/2023   10:31 PM  Alcohol Use Disorder Test (AUDIT)  1. How often do you have a drink containing alcohol? 0 0 1 0 0 0  2. How many drinks containing alcohol do  you have on a typical day when you are drinking? 0 0 0 0 0   3. How often do you have six or more drinks on one occasion? 0 0 0 0 0 0  AUDIT-C Score 0 0 1 0 0   Alcohol Brief Interventions/Follow-up AUDIT Score <7 follow-up not indicated          Goals, Interventions and Follow-up Plan Goals: Increase healthy adjustment to current life circumstances Interventions: CBT Cognitive Behavioral Therapy Follow-up Plan: Refer to [facility] Therapy   Regina Capes

## 2023-10-23 ENCOUNTER — Ambulatory Visit: Payer: Self-pay

## 2023-10-23 NOTE — Telephone Encounter (Signed)
 I doubt that it is , tho since started with new med, oK for her to try toswitch back to OTC potassium 99mg  one twice daily and see how she does with that pls let her know, thanks

## 2023-10-23 NOTE — Telephone Encounter (Signed)
 Copied from CRM [id number]. Topic: Referral - Question >> Oct 23, 2023 12:25 PM Jeris Montes S wrote: Reason for CRM: Patient called in and said the Dr Toni Frank- referred her to [facility] for counciling, however she's not accepting new patients at this time and asked can he refer her to someone else? Pt ask for a cb with the new referral information

## 2023-10-23 NOTE — Telephone Encounter (Signed)
 Pt informed. States she had been mistakenly taking 2 tablets in the morning instead of 1 tablet twice a day as prescribed. States she started taking 1 tablet twice a day this Friday and has found improvement. I encouraged pt to continue taking the Klor Con 10mg  twice a day as prescribed since she s/e have improved. Pt will let me know how she is tolerating on Friday.

## 2023-10-30 ENCOUNTER — Telehealth: Payer: Self-pay

## 2023-10-30 NOTE — Telephone Encounter (Signed)
 Copied from CRM [id number]. Topic: Referral - Status >> Oct 30, 2023  1:21 PM Carlatta H wrote: Reason for CRM: Delrae Field please advise patient on referral status//

## 2023-10-31 NOTE — Telephone Encounter (Signed)
 Checking with Polly Brink to see when pt was supposed to follow up

## 2023-11-06 ENCOUNTER — Telehealth: Payer: Self-pay | Admitting: *Deleted

## 2023-11-06 ENCOUNTER — Encounter: Payer: Self-pay | Admitting: *Deleted

## 2023-11-09 ENCOUNTER — Telehealth: Payer: Self-pay | Admitting: Family Medicine

## 2023-11-09 NOTE — Telephone Encounter (Unsigned)
 Copied from CRM [id number]. Topic: Clinical - Medical Advice >> Nov 09, 2023  4:03 PM Marissa P wrote: Reason for CRM: Patient is trying to find out instead of taking a pill and a half of the sertraline  (ZOLOFT ) 50 MG tablet then to go back to the 100mg  tabs (which would be 2 pills) per visit with [facility] they took x rays and seen she has some arthritis and thinking that the tingling is coming from the depression and the anxiety. Please advise [phone] [facility])

## 2023-11-13 ENCOUNTER — Other Ambulatory Visit: Payer: Self-pay | Admitting: Family Medicine

## 2023-11-16 NOTE — Telephone Encounter (Signed)
 Patient called in regard to Referral placed by Polly Brink  to   [facility]   [place], [place] [address]   [phone]   Office is currently not accepting new patients   Patient  is requesting new referral be placed to facility that is accepting new patients.  Patient wants a  call back in regard.

## 2023-11-17 ENCOUNTER — Ambulatory Visit: Payer: Medicare Other | Admitting: Family Medicine

## 2023-11-20 ENCOUNTER — Telehealth: Payer: Self-pay

## 2023-11-20 NOTE — Telephone Encounter (Signed)
 Copied from CRM [id number]. Topic: Clinical - Prescription Issue >> Nov 20, 2023  1:30 PM Tiffany Mendoza wrote: Reason for CRM: Patient is calling about script that was called in on 05/26 clorazepate  (TRANXENE -T) 7.5 MG tablet ckd the med refill Order History Outpatient Date/Time Action Taken User Additional Information 11/13/23 1010 [employer], [employer]

## 2023-11-22 MED ORDER — CLORAZEPATE DIPOTASSIUM 7.5 MG PO TABS: 7.5000 mg | ORAL_TABLET | Freq: Two times a day (BID) | ORAL | 5 refills | Status: DC | PRN

## 2023-11-22 NOTE — Addendum Note (Signed)
 Addended by: Alberteen Huge E on: 11/22/2023 12:18 PM   Modules accepted: Orders

## 2023-11-22 NOTE — Telephone Encounter (Signed)
 Med is refilled

## 2023-11-24 ENCOUNTER — Other Ambulatory Visit: Payer: Self-pay | Admitting: Licensed Clinical Social Worker

## 2023-11-24 NOTE — Patient Instructions (Signed)
 Visit Information  Thank you for taking time to visit with me today. Please don't hesitate to contact me if I can be of assistance to you before our next scheduled appointment.  Your next care management appointment is by telephone on 12/01/2023 at 1pm  Referral to BSW.  Please call the care guide team at [phone] if you need to cancel, schedule, or reschedule an appointment.   Please call 911 if you are experiencing a Mental Health or Behavioral Health Crisis or need someone to talk to.  Fletcher Humble MSW, LCSW Licensed Clinical Social Worker  [facility], Population Health Direct Dial: [phone]  Fax: [phone]

## 2023-11-24 NOTE — Patient Outreach (Signed)
 Complex Care Management   Visit Note  11/24/2023  Name:  Tiffany Mendoza MRN: [id number] DOB: 1951-01-16  Situation: Referral received for Complex Care Management related to SDOH Barriers:  Financial Resource Strain I obtained verbal consent from Patient.  Visit completed with C Piltz  on the phone  Background:   Past Medical History:  Diagnosis Date   Adjustment disorder with mixed anxiety and depressed mood    Allergy    Carotid bruit    Depression    Dermatophytosis of foot    GERD (gastroesophageal reflux disease)    Hypertension    Insomnia    Neck pain    Osteoporosis     Assessment: Patient Reported Symptoms:  Cognitive Cognitive Status: Able to follow simple commands, Alert and oriented to person, place, and time Cognitive/Intellectual Conditions Management [RPT]: None reported or documented in medical history or problem list      Neurological Neurological Review of Symptoms: No symptoms reported    HEENT HEENT Symptoms Reported: No symptoms reported      Cardiovascular Cardiovascular Symptoms Reported: No symptoms reported    Respiratory Respiratory Symptoms Reported: No symptoms reported    Endocrine Patient reports the following symptoms related to hypoglycemia or hyperglycemia : No symptoms reported    Gastrointestinal Gastrointestinal Symptoms Reported: No symptoms reported      Genitourinary Genitourinary Symptoms Reported: No symptoms reported    Integumentary Integumentary Symptoms Reported: No symptoms reported    Musculoskeletal Musculoskelatal Symptoms Reviewed: No symptoms reported        Psychosocial       Quality of Family Relationships: helpful Do you feel physically threatened by others?: No      10/20/2023    2:10 PM  Depression screen PHQ 2/9  Decreased Interest 1  Down, Depressed, Hopeless 1  PHQ - 2 Score 2  Altered sleeping 0  Tired, decreased energy 1  Change in appetite 1  Feeling bad or failure about yourself  0   Trouble concentrating 0  Moving slowly or fidgety/restless 0  Suicidal thoughts 0  PHQ-9 Score 4  Difficult doing work/chores Somewhat difficult    There were no vitals filed for this visit.  Medications Reviewed Today     Reviewed by Fletcher Humble, LCSW (Social Worker) on 11/24/23 at 1321  Med List Status: <None>   Medication Order Taking? Sig Documenting Provider Last Dose Status Informant  clorazepate  (TRANXENE ) 7.5 MG tablet [id number]  Take 1 tablet (7.5 mg total) by mouth 2 (two) times daily as needed for anxiety. Towanda Fret, MD  Active   clorazepate  (TRANXENE -T) 7.5 MG tablet [id number] No Take 1 tablet (7.5 mg total) by mouth 2 (two) times daily as needed for anxiety. Towanda Fret, MD Taking Active Self, Pharmacy Records  hydrochlorothiazide  (HYDRODIURIL ) 25 MG tablet [id number] No Take 1 tablet (25 mg total) by mouth daily. Towanda Fret, MD Taking Active   potassium chloride  (KLOR-CON  M) 10 MEQ tablet [id number] No Take 1 tablet (10 mEq total) by mouth 2 (two) times daily. Towanda Fret, MD Taking Active            Med Note Oletta Berry, ASHLEY M   Mon Oct 09, 2023  9:19 AM) Patient taking differently. Taking 2 tablets once daily.  sertraline  (ZOLOFT ) 50 MG tablet [id number]  TAKE 1 & 1/2 (ONE & ONE-HALF) TABLETS BY MOUTH ONCE DAILY Alison Irvine, FNP  Active             Recommendation:  Pt encouraged to develop a budget. Pt reports she exhausted [employer] for utility assistance. Pt applied for subsidize housing and currently on wait list. Patient to spk with BSW social worker for further options   Follow Up Plan:   Referral to BSW  Fletcher Humble MSW, LCSW Licensed Clinical Social Worker  [facility], Population Health Direct Dial: [phone]  Fax: [phone]

## 2023-11-28 ENCOUNTER — Encounter: Payer: Self-pay | Admitting: Family Medicine

## 2023-11-28 ENCOUNTER — Ambulatory Visit (INDEPENDENT_AMBULATORY_CARE_PROVIDER_SITE_OTHER): Admitting: Family Medicine

## 2023-11-28 VITALS — BP 122/73 | HR 87 | Resp 16 | Ht 67.0 in | Wt 163.0 lb

## 2023-11-28 DIAGNOSIS — F321 Major depressive disorder, single episode, moderate: Secondary | ICD-10-CM | POA: Diagnosis not present

## 2023-11-28 DIAGNOSIS — R5382 Chronic fatigue, unspecified: Secondary | ICD-10-CM | POA: Diagnosis not present

## 2023-11-28 DIAGNOSIS — I10 Essential (primary) hypertension: Secondary | ICD-10-CM

## 2023-11-28 DIAGNOSIS — F419 Anxiety disorder, unspecified: Secondary | ICD-10-CM | POA: Diagnosis not present

## 2023-11-28 MED ORDER — CLORAZEPATE DIPOTASSIUM 7.5 MG PO TABS: ORAL_TABLET | ORAL | 3 refills | Status: DC

## 2023-11-28 MED ORDER — SERTRALINE HCL 100 MG PO TABS: 100.0000 mg | ORAL_TABLET | Freq: Every day | ORAL | 5 refills | Status: DC

## 2023-11-28 NOTE — Patient Instructions (Addendum)
 F/U in 3 months, call if you need me sooner  Dose increase in sertraline  whichis for both anxiety and depression, dose is 100 mg once daily  Reduce tranxene  to at most 4 times per week,hope is that you will beable to stop this , since zoloft  treats anxiety a swelll and is safer to take  Also chlorazepate may cause fatigue  Please schedule AWV for after 07/29 at check out   Thanks for choosing [facility], we consider it a privelige to serve you.

## 2023-12-01 ENCOUNTER — Other Ambulatory Visit: Payer: Self-pay | Admitting: Licensed Clinical Social Worker

## 2023-12-05 ENCOUNTER — Telehealth: Payer: Self-pay | Admitting: Family Medicine

## 2023-12-05 ENCOUNTER — Other Ambulatory Visit: Payer: Self-pay | Admitting: *Deleted

## 2023-12-05 ENCOUNTER — Other Ambulatory Visit: Payer: Self-pay

## 2023-12-05 ENCOUNTER — Telehealth: Payer: Self-pay

## 2023-12-05 DIAGNOSIS — I10 Essential (primary) hypertension: Secondary | ICD-10-CM

## 2023-12-05 NOTE — Telephone Encounter (Signed)
 Copied from CRM [id number]. Topic: Clinical - Medication Question >> Dec 05, 2023  1:33 PM Tiffany Mendoza wrote: Reason for CRM:  Around Nov 02, 2023, she went to an Urgent Care and they gave her Celecoxib 200 MG to take for arthritis pain. She has been taking this on and off since May 15 (she has taken only 7 pills). Tiffany Mendoza would like to know if she can take this medication here and there for arthritis inflammation. Is it okay to take this medication with her current medications? Please call Tiffany Mendoza at [phone].      -----------------------------------------------------------------------

## 2023-12-05 NOTE — Telephone Encounter (Signed)
 pi

## 2023-12-05 NOTE — Telephone Encounter (Signed)
Pt informed

## 2023-12-05 NOTE — Patient Outreach (Signed)
 Complex Care Management   Visit Note  12/05/2023  Name:  Tiffany Mendoza MRN: [id number] DOB: 07-15-1950  Situation: Referral received for Complex Care Management related to HTN I obtained verbal consent from Patient.  Visit completed with patient  on the phone  Background:   Past Medical History:  Diagnosis Date   Adjustment disorder with mixed anxiety and depressed mood    Allergy    Carotid bruit    Depression    Dermatophytosis of foot    GERD (gastroesophageal reflux disease)    Hypertension    Insomnia    Neck pain    Osteoporosis     Assessment: Patient Reported Symptoms:  Cognitive Cognitive Status: Alert and oriented to person, place, and time, Difficulties with attention and concentration Cognitive/Intellectual Conditions Management [RPT]: None reported or documented in medical history or problem list   Health Maintenance Behaviors: Annual physical exam Healing Pattern: Average Health Facilitated by: Rest  Neurological Neurological Review of Symptoms: No symptoms reported    HEENT HEENT Symptoms Reported: No symptoms reported      Cardiovascular Cardiovascular Symptoms Reported: No symptoms reported Does patient have uncontrolled Hypertension?: No Cardiovascular Conditions: Hypertension, Coronary artery disease Cardiovascular Management Strategies: Medication therapy, Routine screening  Respiratory Respiratory Symptoms Reported: No symptoms reported    Endocrine Patient reports the following symptoms related to hypoglycemia or hyperglycemia : No symptoms reported Is patient diabetic?: No Endocrine Management Strategies: Routine screening  Gastrointestinal Gastrointestinal Symptoms Reported: No symptoms reported Gastrointestinal Management Strategies: Medication therapy Nutrition Risk Screen (CP): No indicators present  Genitourinary Genitourinary Symptoms Reported: No symptoms reported    Integumentary Integumentary Symptoms Reported: No symptoms  reported    Musculoskeletal Musculoskelatal Symptoms Reviewed: No symptoms reported   Falls in the past year?: No Number of falls in past year: 1 or less Was there an injury with Fall?: No Fall Risk Category Calculator: 0 Patient Fall Risk Level: Low Fall Risk Patient at Risk for Falls Due to: No Fall Risks Fall risk Follow up: Falls evaluation completed  Psychosocial Psychosocial Symptoms Reported: No symptoms reported Behavioral Health Conditions: Anxiety Behavioral Management Strategies: Counseling Techniques to Cope with Loss/Stress/Change: Counseling Quality of Family Relationships: helpful Do you feel physically threatened by others?: No      12/05/2023   11:05 AM  Depression screen PHQ 2/9  Decreased Interest 0  Down, Depressed, Hopeless 0  PHQ - 2 Score 0    Vitals:   12/04/23 1106  BP: 135/80  Pulse: 81    Medications Reviewed Today     Reviewed by Remona Carmel, RN (Registered Nurse) on 12/05/23 at 1053  Med List Status: <None>   Medication Order Taking? Sig Documenting Provider Last Dose Status Informant  clorazepate  (TRANXENE -T) 7.5 MG tablet [id number] Yes Take one tablet at bedtime , as needed, for  uncontrolled anxiety , and poor sleep. Maximu is 4 times per week Towanda Fret, MD  Active   hydrochlorothiazide  (HYDRODIURIL ) 25 MG tablet [id number] Yes Take 1 tablet (25 mg total) by mouth daily. Towanda Fret, MD  Active   Potassium 99 MG TABS [id number] Yes Take 99 mg by mouth 2 times daily at 12 noon and 4 pm. [provider]  Active   potassium chloride  (KLOR-CON  M) 10 MEQ tablet [id number]  Take 1 tablet (10 mEq total) by mouth 2 (two) times daily.  Patient not taking: Reported on 12/05/2023   Towanda Fret, MD  Consider Medication Status and Discontinue  Med Note Oletta Berry, Jenilyn Magana M   Mon Oct 09, 2023  9:19 AM) Patient taking differently. Taking 2 tablets once daily.  sertraline  (ZOLOFT ) 100 MG tablet [id number] Yes  Take 1 tablet (100 mg total) by mouth daily. Towanda Fret, MD  Active             Recommendation:   Continue Current Plan of Care  Follow Up Plan:   Telephone follow-up in 1 month  Grandville Lax, BSN RN [facility], [facility] RN Care Manager Direct Dial: [phone]  Fax: [phone]

## 2023-12-05 NOTE — Patient Instructions (Signed)
 Visit Information  Thank you for taking time to visit with me today. Please don't hesitate to contact me if I can be of assistance to you before our next scheduled appointment.  Your next care management appointment is by telephone on 01-03-2024 at 2:30 pm  Telephone follow-up in 1 month  Please call the care guide team at [phone] if you need to cancel, schedule, or reschedule an appointment.   Please call the Suicide and Crisis Lifeline: 988 call the [place]  National Suicide Prevention Lifeline: 873 149 8960 or TTY: 442-722-6369 TTY 402 447 8523) to talk to a trained counselor call 1-800-273-TALK (toll free, 24 hour hotline) call the [facility]: [phone] call 911 if you are experiencing a Mental Health or Behavioral Health Crisis or need someone to talk to.  Grandville Lax, BSN RN [facility], [facility] RN Care Manager Direct Dial: [phone]  Fax: [phone]  DASH Eating Plan DASH stands for Dietary Approaches to Stop Hypertension. The DASH eating plan is a healthy eating plan that has been shown to: Lower high blood pressure (hypertension). Reduce your risk for type 2 diabetes, heart disease, and stroke. Help with weight loss. What are tips for following this plan? Reading food labels Check food labels for the amount of salt (sodium) per serving. Choose foods with less than 5 percent of the Daily Value (DV) of sodium. In general, foods with less than 300 milligrams (mg) of sodium per serving fit into this eating plan. To find whole grains, look for the word whole as the first word in the ingredient list. Shopping Buy products labeled as low-sodium or no salt added. Buy fresh foods. Avoid canned foods and pre-made or frozen meals. Cooking Try not to add salt when you cook. Use salt-free seasonings or herbs instead of table salt or sea salt. Check with your health care provider or pharmacist before using  salt substitutes. Do not fry foods. Cook foods in healthy ways, such as baking, boiling, grilling, roasting, or broiling. Cook using oils that are good for your heart. These include olive, canola, avocado, soybean, and sunflower oil. Meal planning  Eat a balanced diet. This should include: 4 or more servings of fruits and 4 or more servings of vegetables each day. Try to fill half of your plate with fruits and vegetables. 6-8 servings of whole grains each day. 6 or less servings of lean meat, poultry, or fish each day. 1 oz is 1 serving. A 3 oz (85 g) serving of meat is about the same size as the palm of your hand. One egg is 1 oz (28 g). 2-3 servings of low-fat dairy each day. One serving is 1 cup (237 mL). 1 serving of nuts, seeds, or beans 5 times each week. 2-3 servings of heart-healthy fats. Healthy fats called omega-3 fatty acids are found in foods such as walnuts, flaxseeds, fortified milks, and eggs. These fats are also found in cold-water fish, such as sardines, salmon, and mackerel. Limit how much you eat of: Canned or prepackaged foods. Food that is high in trans fat, such as fried foods. Food that is high in saturated fat, such as fatty meat. Desserts and other sweets, sugary drinks, and other foods with added sugar. Full-fat dairy products. Do not salt foods before eating. Do not eat more than 4 egg yolks a week. Try to eat at least 2 vegetarian meals a week. Eat more home-cooked food and less restaurant, buffet, and fast food. Lifestyle When eating at a restaurant, ask  if your food can be made with less salt or no salt. If you drink alcohol: Limit how much you have to: 0-1 drink a day if you are female. 0-2 drinks a day if you are female. Know how much alcohol is in your drink. In the [place] one drink is one 12 oz bottle of beer (355 mL), one 5 oz glass of wine (148 mL), or one 1 oz glass of hard liquor (44 mL). General information Avoid eating more than 2,300 mg of salt a  day. If you have hypertension, you may need to reduce your sodium intake to 1,500 mg a day. Work with your provider to stay at a healthy body weight or lose weight. Ask what the best weight range is for you. On most days of the week, get at least 30 minutes of exercise that causes your heart to beat faster. This may include walking, swimming, or biking. Work with your provider or dietitian to adjust your eating plan to meet your specific calorie needs. What foods should I eat? Fruits All fresh, dried, or frozen fruit. Canned fruits that are in their natural juice and do not have sugar added to them. Vegetables Fresh or frozen vegetables that are raw, steamed, roasted, or grilled. Low-sodium or reduced-sodium tomato and vegetable juice. Low-sodium or reduced-sodium tomato sauce and tomato paste. Low-sodium or reduced-sodium canned vegetables. Grains Whole-grain or whole-wheat bread. Whole-grain or whole-wheat pasta. Brown rice. Dwyane Glad. Bulgur. Whole-grain and low-sodium cereals. Pita bread. Low-fat, low-sodium crackers. Whole-wheat flour tortillas. Meats and other proteins Skinless chicken or [place]. Ground chicken or [place]. Pork with fat trimmed off. Fish and seafood. Egg whites. Dried beans, peas, or lentils. Unsalted nuts, nut butters, and seeds. Unsalted canned beans. Lean cuts of beef with fat trimmed off. Low-sodium, lean precooked or cured meat, such as sausages or meat loaves. Dairy Low-fat (1%) or fat-free (skim) milk. Reduced-fat, low-fat, or fat-free cheeses. Nonfat, low-sodium ricotta or cottage cheese. Low-fat or nonfat yogurt. Low-fat, low-sodium cheese. Fats and oils Soft margarine without trans fats. Vegetable oil. Reduced-fat, low-fat, or light mayonnaise and salad dressings (reduced-sodium). Canola, safflower, olive, avocado, soybean, and sunflower oils. Avocado. Seasonings and condiments Herbs. Spices. Seasoning mixes without salt. Other foods Unsalted popcorn and  pretzels. Fat-free sweets. The items listed above may not be all the foods and drinks you can have. Talk to a dietitian to learn more. What foods should I avoid? Fruits Canned fruit in a light or heavy syrup. Fried fruit. Fruit in cream or butter sauce. Vegetables Creamed or fried vegetables. Vegetables in a cheese sauce. Regular canned vegetables that are not marked as low-sodium or reduced-sodium. Regular canned tomato sauce and paste that are not marked as low-sodium or reduced-sodium. Regular tomato and vegetable juices that are not marked as low-sodium or reduced-sodium. Vanessa General. Olives. Grains Baked goods made with fat, such as croissants, muffins, or some breads. Dry pasta or rice meal packs. Meats and other proteins Fatty cuts of meat. Ribs. Fried meat. Helene Loader. Bologna, salami, and other precooked or cured meats, such as sausages or meat loaves, that are not lean and low in sodium. Fat from the back of a pig (fatback). Bratwurst. Salted nuts and seeds. Canned beans with added salt. Canned or smoked fish. Whole eggs or egg yolks. Chicken or [place] with skin. Dairy Whole or 2% milk, cream, and half-and-half. Whole or full-fat cream cheese. Whole-fat or sweetened yogurt. Full-fat cheese. Nondairy creamers. Whipped toppings. Processed cheese and cheese spreads. Fats and oils Butter. Stick  margarine. Lard. Shortening. Ghee. Bacon fat. Tropical oils, such as coconut, palm kernel, or palm oil. Seasonings and condiments Onion salt, garlic salt, seasoned salt, table salt, and sea salt. Worcestershire sauce. Tartar sauce. Barbecue sauce. Teriyaki sauce. Soy sauce, including reduced-sodium soy sauce. Steak sauce. Canned and packaged gravies. Fish sauce. Oyster sauce. Cocktail sauce. Store-bought horseradish. Ketchup. Mustard. Meat flavorings and tenderizers. Bouillon cubes. Hot sauces. Pre-made or packaged marinades. Pre-made or packaged taco seasonings. Relishes. Regular salad dressings. Other  foods Salted popcorn and pretzels. The items listed above may not be all the foods and drinks you should avoid. Talk to a dietitian to learn more. Where to find more information National Heart, Lung, and Blood Institute (NHLBI): BuffaloDryCleaner.gl American Heart Association (AHA): heart.org Academy of Nutrition and Dietetics: eatright.org National Kidney Foundation (NKF): kidney.org This information is not intended to replace advice given to you by your health care provider. Make sure you discuss any questions you have with your health care provider. Document Revised: 06/23/2022 Document Reviewed: 06/23/2022 Elsevier Patient Education  2024 [employer].

## 2023-12-05 NOTE — Telephone Encounter (Signed)
 Patient called after hours left a voicemail patient wants some information on the last time she had her potassium checked.  Her feet have bee cold at night. Call back # [phone].

## 2023-12-11 ENCOUNTER — Telehealth: Payer: Self-pay

## 2023-12-11 NOTE — Telephone Encounter (Signed)
 Sent mychart message

## 2023-12-11 NOTE — Telephone Encounter (Signed)
 Copied from CRM [id number]. Topic: Clinical - Medical Advice >> Dec 08, 2023  3:43 PM Sophia H wrote: Reason for CRM: clorazepate  (TRANXENE -T) 7.5 MG tablet sertraline  (ZOLOFT ) 100 MG tablet  Patient states since yesterday she feels like her heart rate increases if she starts to get too tired and lays down, patient wants to know if the new medication regimen is causing this. She is wanting to know if temperature change in her environment can contribute to this as well (anxiety) She is not experiencing higher heart rate at this moment. Please advise. [phone]  Clorazepate  - was told no more than 4x per week, only took one yesterday evening. Sertraline  - was taking two 50mg  and now taking one 100mg 

## 2023-12-15 ENCOUNTER — Telehealth: Payer: Self-pay | Admitting: Family Medicine

## 2023-12-15 NOTE — Telephone Encounter (Unsigned)
 Copied from CRM [id number]. Topic: General - Other >> Dec 15, 2023  5:45 PM Tiffini S wrote: Reason for CRM: Patient called stating that she cannot access her MyCHART account with the code. She is asking for a call back at [phone].

## 2023-12-26 ENCOUNTER — Ambulatory Visit: Payer: Self-pay

## 2023-12-26 NOTE — Telephone Encounter (Signed)
 FYI Only or Action Required?: Action required by provider: Pt wants potassium checked. Pt wants a sooner appt with cardiologist. Pt wants provider to tell her it is ok to take Mylanta.  Patient was last seen in primary care on 11/28/2023 by Antonetta Rollene BRAVO, MD.  Called Nurse Triage reporting Heartburn and Advice Only. - no chest pain  Symptoms began today.  Interventions attempted: Other: Drank pepsi and burped.  Symptoms are: completely resolved.  Triage Disposition: Call PCP When Office is Open  Patient/caregiver understands and will follow disposition?:     Pt had heart burn earlier today after rushing to eat. Heart burn resolved after drinking a pepsi and burping.  Pt is wondering if she can take Mylanta if needed. She would like provider to answer this question. Pt is concerned about potassium and wants to have that checked. She  would also like PCP to help her get a sooner appt with Cardiologist. Pt will seek additional care if heartburn returns.                 Copied from CRM [id number]. Topic: Clinical - Medication Question >> Dec 26, 2023  4:57 PM Kevelyn M wrote: Reason for CRM: Patient is asking if she can take Mylanta in between meals in addition to all of the other medications she took today. She had indigestion today and she normally doesn't have it. Call back #[phone]. Reason for Disposition  [1] Caller requesting NON-URGENT health information AND [2] PCP's office is the best resource  Answer Assessment - Initial Assessment Questions 1. REASON FOR CALL or QUESTION: What is your reason for calling today? or How can I best help you? or What question do you have that I can help answer?     Pt had heart burn earlier today after rushing to eat. Heart burn resolved after drinking a pepsi and burping.  Pt is wondering if she can take Mylanta if needed. She would like provider to answer this question. Pt is concerned about potassium and wants to have that  checked. She  would also like PCP to help her get a sooner appt with Cardiologist. Pt will seek additional care if heartburn returns.  Protocols used: Information Only Call - No Triage-A-AH

## 2023-12-27 ENCOUNTER — Telehealth: Payer: Self-pay

## 2023-12-27 DIAGNOSIS — H52203 Unspecified astigmatism, bilateral: Secondary | ICD-10-CM | POA: Diagnosis not present

## 2023-12-27 DIAGNOSIS — H2513 Age-related nuclear cataract, bilateral: Secondary | ICD-10-CM | POA: Diagnosis not present

## 2023-12-27 NOTE — Telephone Encounter (Signed)
 Pt can call central scheduling [phone] to set it up. Lvm. Also see other phone note regarding potassium

## 2023-12-27 NOTE — Telephone Encounter (Signed)
 Copied from CRM [id number]. Topic: Clinical - Request for Lab/Test Order >> Dec 27, 2023 11:35 AM Selinda RAMAN wrote: Reason for CRM: The patient called in stating I know a CT Chest has been ordered but she is wondering if it has been scheduled. Please assist patient further as she was told someone would be calling her to schedule it.

## 2023-12-27 NOTE — Telephone Encounter (Signed)
 STILL HAS OUTSTANDING POTASSIUM ORDER FROM jUNE IN SYSTEM , LET HER KNOW TO GET THIS DONE MYLANTA MAY BE TRIED TO RELIEVE THE HEARTBURN, NO MORE THAN 2 PER DAY

## 2023-12-27 NOTE — Telephone Encounter (Signed)
 Lvm to cb. See other note regarding ct scan as well

## 2024-01-01 ENCOUNTER — Encounter: Payer: Self-pay | Admitting: Family Medicine

## 2024-01-01 NOTE — Assessment & Plan Note (Signed)
 Controlled, no change in medication

## 2024-01-01 NOTE — Progress Notes (Signed)
   Tiffany Mendoza     MRN: [id number]      DOB: July 20, 1950  Chief Complaint  Patient presents with   Hypertension    5 month follow up   Fatigue    Discuss ongoing fatigue     HPI Tiffany Mendoza is here for follow up and re-evaluation of chronic medical conditions, medication management and review of any available recent lab and radiology data.  Preventive health is updated, specifically  Cancer screening and Immunization.   Questions or concerns regarding consultations or procedures which the PT has had in the interim are  addressed. The PT denies any adverse reactions to current medications since the last visit.  Concerns as above ROS Denies recent fever or chills. Denies sinus pressure, nasal congestion, ear pain or sore throat. Denies chest congestion, productive cough or wheezing. Denies chest pains, palpitations and leg swelling Denies abdominal pain, nausea, vomiting,diarrhea or constipation.   Denies dysuria, frequency, hesitancy or incontinence. Denies joint pain, swelling and limitation in mobility. Denies headaches, seizures, numbness, or tingling. Denies skin break down or rash.   PE  BP 122/73   Pulse 87   Resp 16   Ht 5' 7 (1.702 m)   Wt 163 lb (73.9 kg)   SpO2 96%   BMI 25.53 kg/m   Patient alert and oriented and in no cardiopulmonary distress.  HEENT: No facial asymmetry, EOMI,     Neck supple .  Chest: Clear to auscultation bilaterally.  CVS: S1, S2 no murmurs, no S3.Regular rate.  ABD: Soft non tender.   Ext: No edema  MS: Adequate ROM spine, shoulders, hips and knees.  Skin: Intact, no ulcerations or rash noted.  Psych: Good eye contact, normal affect. Memory intact mildly  anxious and  depressed appearing.  CNS: CN 2-12 intact, power,  normal throughout.no focal deficits noted.   Assessment & Plan  Depression, major, single episode, moderate (HCC) Improved, however not adequately treated, inc zoloft  dose to 100 mg daily, and wean off  tranxene   Anxiety Improved but not at goal, wean off tranxene   eventually, current recommendation is to rewduce to 4 times weekly,  and rely on higher dose zoloft  to manege anxiety  Fatigue Continues to c/o chronic fatigue , multifactorial, chronic inadequately treated depression and  anxiety with significant financial strain/ challenge also on daily tranxene  ,, medication adjustments made, and encouraged to start daily exercise program  Essential hypertension Controlled, no change in medication

## 2024-01-01 NOTE — Assessment & Plan Note (Signed)
 Continues to c/o chronic fatigue , multifactorial, chronic inadequately treated depression and  anxiety with significant financial strain/ challenge also on daily tranxene  ,, medication adjustments made, and encouraged to start daily exercise program

## 2024-01-01 NOTE — Assessment & Plan Note (Signed)
 Improved, however not adequately treated, inc zoloft  dose to 100 mg daily, and wean off tranxene 

## 2024-01-01 NOTE — Assessment & Plan Note (Addendum)
 Improved but not at goal, wean off tranxene   eventually, current recommendation is to rewduce to 4 times weekly,  and rely on higher dose zoloft  to [facility] anxiety

## 2024-01-03 ENCOUNTER — Other Ambulatory Visit: Payer: Self-pay | Admitting: *Deleted

## 2024-01-03 NOTE — Patient Instructions (Signed)
 Visit Information  Thank you for taking time to visit with me today. Please don't hesitate to contact me if I can be of assistance to you before our next scheduled appointment.  Your next care management appointment is by telephone on 02/02/2024 at 2:30 PM   Telephone follow-up in 1 month  Please call the care guide team at [phone] if you need to cancel, schedule, or reschedule an appointment.   Please call the Suicide and Crisis Lifeline: 988 call the [place]  National Suicide Prevention Lifeline: 717-320-1895 or TTY: 636-006-8519 TTY 401 543 4459) to talk to a trained counselor call 1-800-273-TALK (toll free, 24 hour hotline) go to [facility] [address] [place] [phone]) call 911 if you are experiencing a Mental Health or Behavioral Health Crisis or need someone to talk to.  Hendricks Her RN, BSN  [place] [facility] RN Case Information systems manager [phone]

## 2024-01-03 NOTE — Patient Outreach (Signed)
 Complex Care Management   Visit Note  01/03/2024  Name:  Tiffany Mendoza MRN: [id number] DOB: [birth year]  Situation: Referral received for Complex Care Management related to Hypertension  I obtained verbal consent from Patient.  Visit completed with patient   on the phone  Background:   Past Medical History:  Diagnosis Date   Adjustment disorder with mixed anxiety and depressed mood    Allergy    Carotid bruit    Depression    Dermatophytosis of foot    GERD (gastroesophageal reflux disease)    Hypertension    Insomnia    Neck pain    Osteoporosis     Assessment: Patient Reported Symptoms:  Cognitive Cognitive Status: Difficulties with attention and concentration, Alert and oriented to person, place, and time Cognitive/Intellectual Conditions Management [RPT]: None reported or documented in medical history or problem list   Health Maintenance Behaviors: Annual physical exam Healing Pattern: Average Health Facilitated by: Rest  Neurological Neurological Review of Symptoms: No symptoms reported Neurological Management Strategies: Routine screening Neurological Self-Management Outcome: 4 (good)  HEENT HEENT Symptoms Reported: Mouth dryness HEENT Management Strategies: Routine screening HEENT Self-Management Outcome: 4 (good)    Cardiovascular Cardiovascular Symptoms Reported: Fatigue Does patient have uncontrolled Hypertension?: No Cardiovascular Management Strategies: Routine screening Cardiovascular Self-Management Outcome: 4 (good)  Respiratory Respiratory Symptoms Reported: No symptoms reported Respiratory Management Strategies: Routine screening Respiratory Self-Management Outcome: 4 (good)  Endocrine Endocrine Symptoms Reported: No symptoms reported Is patient diabetic?: No Endocrine Self-Management Outcome: 4 (good)  Gastrointestinal Gastrointestinal Symptoms Reported: No symptoms reported Gastrointestinal Self-Management Outcome: 4 (good) Nutrition Risk  Screen (CP): No indicators present  Genitourinary Genitourinary Symptoms Reported: No symptoms reported Genitourinary Self-Management Outcome: 4 (good)  Integumentary Integumentary Symptoms Reported: No symptoms reported Skin Management Strategies: Routine screening Skin Self-Management Outcome: 4 (good)  Musculoskeletal Musculoskelatal Symptoms Reviewed: Joint pain Musculoskeletal Management Strategies: Medication therapy Musculoskeletal Self-Management Outcome: 4 (good) Falls in the past year?: No Number of falls in past year: 1 or less Was there an injury with Fall?: No Fall Risk Category Calculator: 0 Patient Fall Risk Level: Low Fall Risk Patient at Risk for Falls Due to: No Fall Risks Fall risk Follow up: Falls evaluation completed  Psychosocial Psychosocial Symptoms Reported: Anxiety - if selected complete GAD Behavioral Management Strategies: Medication therapy, Coping strategies Behavioral Health Self-Management Outcome: 4 (good) Techniques to Cope with Loss/Stress/Change: Diversional activities, Spiritual practice(s) Quality of Family Relationships: helpful Do you feel physically threatened by others?: No      01/03/2024    3:16 PM  Depression screen PHQ 2/9  Decreased Interest 0  Down, Depressed, Hopeless 0  PHQ - 2 Score 0    There were no vitals filed for this visit.  Medications Reviewed Today     Reviewed by Kay Hendricks MATSU, RN (Case Manager) on 01/03/24 at 1453  Med List Status: <None>   Medication Order Taking? Sig Documenting Provider Last Dose Status Informant  celecoxib (CELEBREX) 200 MG capsule [id number] Yes Take 200 mg by mouth daily.  Patient taking differently: Take 200 mg by mouth as needed.   [provider]  Consider Medication Status and Discontinue   clorazepate  (TRANXENE -T) 7.5 MG tablet [id number] Yes Take one tablet at bedtime , as needed, for  uncontrolled anxiety , and poor sleep. Maximu is 4 times per week Antonetta Rollene BRAVO, MD   Active   hydrochlorothiazide  (HYDRODIURIL ) 25 MG tablet [id number] Yes Take 1 tablet (25 mg total) by mouth daily. Antonetta Rollene BRAVO, MD  Active   Potassium 99 MG TABS [id number] Yes Take 99 mg by mouth 2 times daily at 12 noon and 4 pm. [provider]  Active   potassium chloride  (KLOR-CON  M) 10 MEQ tablet [id number]  Take 1 tablet (10 mEq total) by mouth 2 (two) times daily.  Patient not taking: Reported on 01/03/2024   Antonetta Rollene BRAVO, MD  Active            Med Note GARNET, ASHLEY M   Mon Oct 09, 2023  9:19 AM) Patient taking differently. Taking 2 tablets once daily.  sertraline  (ZOLOFT ) 100 MG tablet [id number] Yes Take 1 tablet (100 mg total) by mouth daily. Antonetta Rollene BRAVO, MD  Active             Recommendation:   Continue Current Plan of Care  Follow Up Plan:  Telephone follow-up in 1 month   Hendricks Her RN, BSN  [place] [facility]Population [facility] RN Case Manager   Direct [phone]

## 2024-01-05 ENCOUNTER — Ambulatory Visit (INDEPENDENT_AMBULATORY_CARE_PROVIDER_SITE_OTHER)

## 2024-01-05 VITALS — BP 142/82

## 2024-01-05 DIAGNOSIS — I10 Essential (primary) hypertension: Secondary | ICD-10-CM | POA: Diagnosis not present

## 2024-01-05 NOTE — Progress Notes (Signed)
 Patient is in office today for a nurse visit for Blood Pressure Check. Patient blood pressure was 142/82, Patient No shortness of breath

## 2024-01-06 ENCOUNTER — Ambulatory Visit: Payer: Self-pay | Admitting: Family Medicine

## 2024-01-06 LAB — BMP8+EGFR
BUN/Creatinine Ratio: 18 (ref 12–28)
BUN: 14 mg/dL (ref 8–27)
CO2: 25 mmol/L (ref 20–29)
Calcium: 9.8 mg/dL (ref 8.7–10.3)
Chloride: 101 mmol/L (ref 96–106)
Creatinine, Ser: 0.76 mg/dL (ref 0.57–1.00)
Glucose: 88 mg/dL (ref 70–99)
Potassium: 3.9 mmol/L (ref 3.5–5.2)
Sodium: 140 mmol/L (ref 134–144)
eGFR: 82 mL/min/1.73 (ref >59)

## 2024-01-18 ENCOUNTER — Other Ambulatory Visit: Payer: Self-pay | Admitting: Family Medicine

## 2024-02-02 ENCOUNTER — Other Ambulatory Visit: Payer: Self-pay | Admitting: *Deleted

## 2024-02-02 NOTE — Patient Outreach (Signed)
 Complex Care Management   Visit Note  02/02/2024  Name:  Tiffany Mendoza MRN: [id number] DOB: Jul 03, 1950  Situation: Referral received for Complex Care Management related to HTN I obtained verbal consent from Patient.  Visit completed with patient  on the phone  Background:   Past Medical History:  Diagnosis Date   Adjustment disorder with mixed anxiety and depressed mood    Allergy    Carotid bruit    Depression    Dermatophytosis of foot    GERD (gastroesophageal reflux disease)    Hypertension    Insomnia    Neck pain    Osteoporosis     Assessment: Patient Reported Symptoms:  Cognitive Cognitive Status: Difficulties with attention and concentration Cognitive/Intellectual Conditions Management [RPT]: None reported or documented in medical history or problem list   Health Maintenance Behaviors: Annual physical exam Healing Pattern: Average Health Facilitated by: Prayer/meditation, Rest  Neurological Neurological Review of Symptoms: No symptoms reported    HEENT HEENT Symptoms Reported: No symptoms reported      Cardiovascular Cardiovascular Symptoms Reported: Fatigue Does patient have uncontrolled Hypertension?: No Cardiovascular Management Strategies: Medication therapy, Routine screening  Respiratory Respiratory Symptoms Reported: Dry cough Respiratory Management Strategies: Routine screening  Endocrine Endocrine Symptoms Reported: No symptoms reported Is patient diabetic?: No    Gastrointestinal Gastrointestinal Symptoms Reported: No symptoms reported   Nutrition Risk Screen (CP): No indicators present  Genitourinary Genitourinary Symptoms Reported: No symptoms reported    Integumentary Integumentary Symptoms Reported: No symptoms reported    Musculoskeletal Musculoskelatal Symptoms Reviewed: Joint pain Musculoskeletal Self-Management Outcome: 4 (good) Falls in the past year?: No Number of falls in past year: 1 or less Was there an injury with Fall?:  No Fall Risk Category Calculator: 0 Patient Fall Risk Level: Low Fall Risk Patient at Risk for Falls Due to: No Fall Risks Fall risk Follow up: Falls evaluation completed  Psychosocial Psychosocial Symptoms Reported: Not assessed     Quality of Family Relationships: helpful Do you feel physically threatened by others?: No      01/03/2024    3:16 PM  Depression screen PHQ 2/9  Decreased Interest 0  Down, Depressed, Hopeless 0  PHQ - 2 Score 0    There were no vitals filed for this visit.  Medications Reviewed Today     Reviewed by Bertrum Rosina HERO, RN (Registered Nurse) on 02/02/24 at 1507  Med List Status: <None>   Medication Order Taking? Sig Documenting Provider Last Dose Status Informant  celecoxib (CELEBREX) 200 MG capsule [id number]  Take 200 mg by mouth daily.  Patient not taking: Reported on 02/02/2024   [provider]  Consider Medication Status and Discontinue   clorazepate  (TRANXENE -T) 7.5 MG tablet [id number] Yes Take one tablet at bedtime , as needed, for  uncontrolled anxiety , and poor sleep. Maximu is 4 times per week Antonetta Rollene BRAVO, MD  Active   hydrochlorothiazide  (HYDRODIURIL ) 25 MG tablet [id number] Yes Take 1 tablet by mouth once daily Antonetta Rollene BRAVO, MD  Active   Potassium 99 MG TABS [id number] Yes Take 99 mg by mouth 2 times daily at 12 noon and 4 pm. [provider]  Active   potassium chloride  (KLOR-CON  M) 10 MEQ tablet [id number]  Take 1 tablet (10 mEq total) by mouth 2 (two) times daily.  Patient not taking: Reported on 02/02/2024   Antonetta Rollene BRAVO, MD  Consider Medication Status and Discontinue            Med Note (  BERTRUM ROSINA HERO   Mon Oct 09, 2023  9:19 AM) Patient taking differently. Taking 2 tablets once daily.  sertraline  (ZOLOFT ) 100 MG tablet [id number] Yes Take 1 tablet (100 mg total) by mouth daily. Antonetta Rollene BRAVO, MD  Active             Recommendation:   Continue Current Plan of Care  Follow Up Plan:    Telephone follow-up in 1 month  ROSINA BERTRUM, BSN RN [facility], [facility] RN Care Manager Direct Dial: [phone]  Fax: [phone]

## 2024-02-02 NOTE — Patient Instructions (Signed)
 Visit Information  Thank you for taking time to visit with me today. Please don't hesitate to contact me if I can be of assistance to you before our next scheduled appointment.  Your next care management appointment is by telephone on 03-01-2024 at 2:30 pm  Telephone follow-up in 1 month  Please call the care guide team at [phone] if you need to cancel, schedule, or reschedule an appointment.   Please call the Suicide and Crisis Lifeline: 988 call the [place]  National Suicide Prevention Lifeline: 731-599-8550 or TTY: 808-887-2159 TTY (980)414-8226) to talk to a trained counselor call 1-800-273-TALK (toll free, 24 hour hotline) go to [facility] [address], [place] [phone]) call 911 if you are experiencing a Mental Health or Behavioral Health Crisis or need someone to talk to.  Rosina Forte, BSN RN [facility], [facility] RN Care Manager Direct Dial: [phone]  Fax: [phone]

## 2024-02-15 ENCOUNTER — Ambulatory Visit (INDEPENDENT_AMBULATORY_CARE_PROVIDER_SITE_OTHER): Admitting: Professional Counselor

## 2024-02-15 DIAGNOSIS — F419 Anxiety disorder, unspecified: Secondary | ICD-10-CM

## 2024-02-21 ENCOUNTER — Encounter: Payer: Self-pay | Admitting: Pediatric Intensive Care

## 2024-02-21 ENCOUNTER — Ambulatory Visit: Admitting: Internal Medicine

## 2024-02-21 NOTE — Congregational Nurse Program (Signed)
  Dept: [phone]   Congregational Nurse Program Note  Date of Encounter: 02/21/2024  Past Medical History: Past Medical History:  Diagnosis Date   Adjustment disorder with mixed anxiety and depressed mood    Allergy    Carotid bruit    Depression    Dermatophytosis of foot    GERD (gastroesophageal reflux disease)    Hypertension    Insomnia    Neck pain    Osteoporosis     Encounter Details:  Community Questionnaire - 02/21/24 1500       Questionnaire   Ask client: Do you give verbal consent for me to treat you today? Yes    Student Assistance N/A    Location Patient [facility]    Encounter Setting CN site    Population Status Unknown    Insurance Medicare;Medicaid    Insurance/Financial Assistance Referral N/A    Medication N/A    Medical Provider Yes    Screening Referrals Made N/A    Medical Referrals Made N/A    Medical Appointment Completed N/A    CNP Interventions Advocate/Support    Screenings CN Performed Blood Pressure    ED Visit Averted N/A    Life-Saving Intervention Made N/A         Initial encounter with client. Client states no health needs at this time but would like a space to come talk and get a BP check as needed.  Richerd Plana BSN RN CCNP [phone]

## 2024-02-26 ENCOUNTER — Ambulatory Visit: Payer: Self-pay | Admitting: *Deleted

## 2024-02-26 NOTE — Telephone Encounter (Signed)
 Patient scheduled.

## 2024-02-26 NOTE — BH Specialist Note (Signed)
 [facility] Telephone Follow-up  MRN: [id number] NAME: Tiffany Mendoza Date: 02/15/2019  Start time: Start Time: 0230 End time: Stop Time: 0300 Total time: Total Time in Minutes (Visit): 30 Call number: Visit Number: 4- Fourth Visit  Reason for call today:  Patient is a 73 year old female seen for a collaborative care follow-up. She was previously dismissed from collaborative care services due to relocating to [place]. An attempt was made to connect her with a traditional therapist, but this was unsuccessful due to insurance barriers. The patient later contacted the clinic requesting to be seen again, and the behavioral counselor agreed.  During the current assessment, the patient continues to experience anxiety. Since moving, she reports difficulties with a neighbor, expressing strong concerns that she can hear them talking about her through the walls. These experiences have been highly distressing and preoccupying, leading her to feel overwhelmed and fearful. She describes the voices as muffled but interprets them as directed at her, believing that others are watching and listening to her. She has sought advice from her apartment manager and others but remains convinced of what she hears. The behavioral counselor noted possible paranoid features, though this cannot be confirmed at this time.  Supportive counseling was provided during the session. The patient was encouraged to pursue connection with a traditional therapist for ongoing CBT-based treatment to help manage anxiety, particularly as she is not interested in medication. Given the nature of her symptoms, a referral for psychiatric or neurological consultation is recommended for further evaluation. Support will be provided in the meantime.   PHQ-9 Scores:     01/03/2024    3:16 PM 12/05/2023   11:05 AM 11/28/2023    2:42 PM 10/20/2023    2:10 PM 10/17/2023    3:36 PM  Depression screen PHQ 2/9  Decreased Interest 0 0 1 1 3    Down, Depressed, Hopeless 0 0 1 1 1   PHQ - 2 Score 0 0 2 2 4   Altered sleeping   1 0 0  Tired, decreased energy   2 1 3   Change in appetite   1 1 1   Feeling bad or failure about yourself    0 0 0  Trouble concentrating   0 0 1  Moving slowly or fidgety/restless   0 0 1  Suicidal thoughts   0 0 0  PHQ-9 Score   6 4 10   Difficult doing work/chores   Somewhat difficult Somewhat difficult Somewhat difficult   GAD-7 Scores:     01/03/2024    3:17 PM 11/28/2023    2:43 PM 10/20/2023    2:11 PM 10/17/2023    3:37 PM  GAD 7 : Generalized Anxiety Score  Nervous, Anxious, on Edge 3 1 1 1   Control/stop worrying 1 1 0 1  Worry too much - different things 3 1 1 1   Trouble relaxing 1 1 0 0  Restless 0 0 0   Easily annoyed or irritable 0 0 0 0  Afraid - awful might happen 0 1 0 0  Total GAD 7 Score 8 5 2    Anxiety Difficulty Somewhat difficult Somewhat difficult Somewhat difficult Somewhat difficult    Stress Current stressors:  Neighbors Sleep:  Good Appetite:  Good Coping ability:  Fair Patient taking medications as prescribed:  na  Current medications:  Outpatient Encounter Medications as of 02/15/2024  Medication Sig   celecoxib (CELEBREX) 200 MG capsule Take 200 mg by mouth daily. (Patient not taking: Reported on 02/02/2024)  clorazepate  (TRANXENE -T) 7.5 MG tablet Take one tablet at bedtime , as needed, for  uncontrolled anxiety , and poor sleep. Maximu is 4 times per week   hydrochlorothiazide  (HYDRODIURIL ) 25 MG tablet Take 1 tablet by mouth once daily   Potassium 99 MG TABS Take 99 mg by mouth 2 times daily at 12 noon and 4 pm.   potassium chloride  (KLOR-CON  M) 10 MEQ tablet Take 1 tablet (10 mEq total) by mouth 2 (two) times daily. (Patient not taking: Reported on 02/02/2024)   sertraline  (ZOLOFT ) 100 MG tablet Take 1 tablet (100 mg total) by mouth daily.   No facility-administered encounter medications on file as of 02/15/2024.     Self-harm Behaviors Risk  Assessment Self-harm risk factors:   Patient endorses recent thoughts of harming self:    [place] Suicide Severity Rating Scale: Failed to redirect to the Timeline version of the REVFS SmartLink.   Danger to Others Risk Assessment Danger to others risk factors:   Patient endorses recent thoughts of harming others:    Dynamic Appraisal of Situational Aggression (DASA):      No data to display           Substance Use Assessment Patient recently consumed alcohol:    Alcohol Use Disorder Identification Test (AUDIT):     12/11/2017   12:24 PM 05/07/2020    3:13 PM 12/16/2020   11:50 AM 01/11/2022    3:32 PM 01/16/2023    2:03 PM 06/18/2023   10:31 PM  Alcohol Use Disorder Test (AUDIT)  1. How often do you have a drink containing alcohol? 0 0 1 0 0 0  2. How many drinks containing alcohol do you have on a typical day when you are drinking? 0 0 0 0 0   3. How often do you have six or more drinks on one occasion? 0 0 0 0 0 0  AUDIT-C Score 0 0 1 0 0   Alcohol Brief Interventions/Follow-up AUDIT Score <7 follow-up not indicated           Data saved with a previous flowsheet row definition     Goals, Interventions and Follow-up Plan Goals: Increase healthy adjustment to current life circumstances Interventions: Behavioral Activation and CBT Cognitive Behavioral Therapy Follow-up Plan: Refer to Psychiatrist for Medication Management   Tiffany Mendoza

## 2024-02-26 NOTE — Telephone Encounter (Signed)
 FYI Only or Action Required?: Action required by provider: update on patient condition and mental health needs.  Patient was last seen in primary care on 11/28/2023 by Antonetta Rollene BRAVO, MD.  Called Nurse Triage reporting Anxiety.  Symptoms began about a month ago.  Interventions attempted: Other: unsure.  Symptoms are: gradually worsening.  Triage Disposition: See Physician Within 24 Hours  Patient/caregiver understands and will follow disposition?: Yes   Please advise .                Copied from CRM [id number]. Topic: Clinical - Red Word Triage >> Feb 26, 2024  8:43 AM Tiffany Mendoza wrote: [facility] that prompted transfer to Nurse Triage: Patients friend Tiffany Mendoza calling states he friend is going through some mental health issues and asked her to call and speak with a nurse in regards to what is going on. Says she thinks she needs to come in to be evaluated.  Tiffany Mendoza says she spoke to after hours last night but no notes) Reason for Disposition  Patient sounds very upset or troubled to the triager  Answer Assessment - Initial Assessment Questions Appt scheduled for tomorrow with other provider. None available with PCP. Patient friend not on DPR , HIPAA precautions followed. Patient friend reports if NT calls patient would not talk with nurse, due to very suspicious. Recommended if patient worsens call 911 or take to ED . Patient feels house if bugged and does not want to talk on phone. Reviewed options with patient friend; urgent crisis center [phone] due to patient now lives in [place]. Reviewed with patient friend, Tiffany to notify patient of appt tomorrow and see if patient willing to come in for OV. If not call back and cancel and other resource ED. Please advise. Patient's PCP not available until Jan. And patient has good communication with PCP. Tiffany Mendoza reports patient is safe and has not expressed harming self or others.   CAL aware of appt  tomorrow.       1. CONCERN: Did anything happen that prompted you to call today?      Patient friend calling to report patient with behavioral issues , anxiety, fear of others , suspicious of others around her and  thoughts of being bugged , dresses in the dark to prevent others from seeing her at times. Feels neighbors are talking and spying on her. 2. ANXIETY SYMPTOMS: Can you describe how you (your loved one; patient) have been feeling? (e.g., tense, restless, panicky, anxious, keyed up, overwhelmed, sense of impending doom).      Overwhelmed suspicious thoughts.  3. ONSET: How long have you been feeling this way? (e.g., hours, days, weeks)     1 month 1/2 4. SEVERITY: How would you rate the level of anxiety? (e.g., 0 - 10; or mild, moderate, severe).     Unsure per patient frient  5. FUNCTIONAL IMPAIRMENT: How have these feelings affected your ability to do daily activities? Have you had more difficulty than usual doing your normal daily activities? (e.g., getting better, same, worse; self-care, school, work, interactions)     Patient has not been coming out of home and unsure if patient is eating properly  6. HISTORY: Have you felt this way before? Have you ever been diagnosed with an anxiety problem in the past? (e.g., generalized anxiety disorder, panic attacks, PTSD). If Yes, ask: How was this problem treated? (e.g., medicines, counseling, etc.)     Unsure  7. RISK OF HARM - SUICIDAL IDEATION: Do you  ever have thoughts of hurting or killing yourself? If Yes, ask:  Do you have these feelings now? Do you have a plan on how you would do this?     Patient friend denies  8. TREATMENT:  What has been done so far to treat this anxiety? (e.g., medicines, relaxation strategies). What has helped?     unknown 9. THERAPIST: Do you have a counselor or therapist? If Yes, ask: What is their name?     na 10. POTENTIAL TRIGGERS: Do you drink caffeinated beverages  (e.g., coffee, colas, teas), and how much daily? Do you drink alcohol or use any drugs? Have you started any new medicines recently?       na 11. PATIENT SUPPORT: Who is with you now? Who do you live with? Do you have family or friends who you can talk to?        No one and lives alone , friend comes over to see patient at times. 12. OTHER SYMPTOMS: Do you have any other symptoms? (e.g., feeling depressed, trouble concentrating, trouble sleeping, trouble breathing, palpitations or fast heartbeat, chest pain, sweating, nausea, or diarrhea)       Anxious, does not want to speak to others on phone due to suspicious thoughts. Thinks neighbors are talking about her. 13. PREGNANCY: Is there any chance you are pregnant? When was your last menstrual period?       na  Protocols used: Anxiety and Panic Attack-A-AH

## 2024-02-27 ENCOUNTER — Ambulatory Visit (INDEPENDENT_AMBULATORY_CARE_PROVIDER_SITE_OTHER)

## 2024-02-27 ENCOUNTER — Ambulatory Visit: Admitting: Internal Medicine

## 2024-02-27 ENCOUNTER — Other Ambulatory Visit: Payer: Self-pay | Admitting: Family Medicine

## 2024-02-27 VITALS — BP 131/81 | HR 76 | Ht 67.0 in | Wt 154.0 lb

## 2024-02-27 DIAGNOSIS — R4189 Other symptoms and signs involving cognitive functions and awareness: Secondary | ICD-10-CM | POA: Diagnosis not present

## 2024-02-27 DIAGNOSIS — F419 Anxiety disorder, unspecified: Secondary | ICD-10-CM | POA: Diagnosis not present

## 2024-02-27 MED ORDER — SERTRALINE HCL 100 MG PO TABS: 50.0000 mg | ORAL_TABLET | Freq: Every day | ORAL | Status: DC

## 2024-02-27 NOTE — Progress Notes (Unsigned)
 Established Patient Office Visit  Subjective   Patient ID: Tiffany Mendoza, female    DOB: 06-22-1950  Age: 73 y.o. MRN: [id number]  Chief Complaint  Patient presents with   Medical Management of Chronic Issues    Worsening anxiety    HPI Discussed the use of AI scribe software for clinical note transcription with the patient, who gave verbal consent to proceed.  History of Present Illness   Tiffany Mendoza is a 73 year old female who presents with concerns of being monitored and threatened by neighbors.  Paranoid delusions and auditory hallucinations - Believes neighbors are monitoring her apartment and installing devices in her walls and under her bed - Hears noises such as 'thump, thump, thump' and voices she attributes to neighbors monitoring her movements, especially in the bathroom or while sleeping - Reports history of threats from neighbors, including hearing discussions about a potential break-in and shooting - Has contacted police multiple times regarding these concerns but feels action is limited due to lack of evidence  Anxiety and sleep disturbance - Experiences significant anxiety related to perceived threats and monitoring - Difficulty sleeping - Turns off bathroom lights and alters routine to maintain privacy and avoid being seen - Takes Zoloft , previously at 100 mg, considering dose reduction due to side effects - Uses clorazepate  as needed for anxiety  Chronic fatigue and hypokalemia - Chronic fatigue attributed in part to previously low potassium levels - Manages low potassium with medication  Auditory disturbances and hearing concerns - Hears her own heartbeat and experiences muffled sounds - Attributes symptoms to possible earwax buildup - Recalls previous referral to a hearing specialist but has not pursued further evaluation      Patient Active Problem List   Diagnosis Date Noted   Alteration in cognition 03/03/2024   Depression, major, single  episode, moderate (HCC) 09/29/2023   DOE (dyspnea on exertion) 09/29/2023   Lung nodule seen on imaging study 09/10/2023   Abnormal CT scan, chest 09/10/2023   Low serum potassium 08/31/2023   Vitamin D  deficiency 04/03/2023   Mixed hyperlipidemia 02/17/2023   Coronary artery disease due to lipid rich plaque 02/17/2023   Financial insecurity 02/13/2022   Housing insecurity 02/10/2022   Essential hypertension 11/13/2021   Encounter for examination following treatment at hospital 09/26/2021   Hemorrhoids 11/10/2020   Anxiety 04/06/2019   Cutaneous skin tags 10/28/2016   Elevated serum immunoglobulin free light chain level 10/20/2016   Fatigue 12/08/2009   Mixed anxiety and depressive disorder 07/09/2009   ROS    Objective:     BP 131/81   Pulse 76   Ht 5' 7 (1.702 m)   Wt 154 lb (69.9 kg)   SpO2 94%   BMI 24.12 kg/m  BP Readings from Last 3 Encounters:  02/27/24 131/81  02/21/24 110/70  01/05/24 (!) 142/82   Wt Readings from Last 3 Encounters:  02/27/24 154 lb (69.9 kg)  11/28/23 163 lb (73.9 kg)  10/17/23 164 lb 1.3 oz (74.4 kg)   Physical Exam Vitals and nursing note reviewed. Exam conducted with a chaperone present (pt's friend Lebron is with her today).  Constitutional:      Appearance: Normal appearance.  HENT:     Head: Normocephalic.  Eyes:     Extraocular Movements: Extraocular movements intact.     Pupils: Pupils are equal, round, and reactive to light.  Cardiovascular:     Rate and Rhythm: Normal rate and regular rhythm.  Pulmonary:  Effort: Pulmonary effort is normal.     Breath sounds: Normal breath sounds.  Musculoskeletal:     Cervical back: Normal range of motion and neck supple.  Neurological:     Mental Status: She is alert and oriented to person, place, and time.  Psychiatric:        Attention and Perception: Attention and perception normal.        Mood and Affect: Mood and affect normal.        Speech: Speech is rapid and pressured  and tangential.        Behavior: Behavior normal. Behavior is cooperative.        Thought Content: Thought content is delusional. Thought content does not include homicidal or suicidal ideation. Thought content does not include homicidal or suicidal plan.        Cognition and Memory: Cognition is impaired.        Judgment: Judgment is inappropriate.       The 10-year ASCVD risk score (Arnett DK, et al., 2019) is: 13.1%    Assessment & Plan:   Problem List Items Addressed This Visit       Nervous and Auditory   Alteration in cognition - Primary   The patient feels very strongly that she is being spied on by her neighbors below her.  She moved into an apartment complex 6 months ago.  Within the past two months she has been hearing the voices of her neighbors spying on her, and even believes that they have cameras set up in her apartment in order to spy on her.  She has called out the police multiple times due to these beliefs, but without any concrete evidence, they have been unable to help her.   She has a Child psychotherapist coming out to meet with her tomorrow, which was initiated by police.   I have recommended that she play some background music or the television in order to drown out the voices of her neighbors.  I have also tried to reassure her that there aren't cameras set up in her apartment.   I have also instructed her to decrease the dose of her sertraline  to 50 mg, since her symptoms seemed to coincide with dose increase 2 months ago to 100 mg.  Her tranxene  was also decreased at this time.  Symptoms may be d/t benzo withdrawal? She agrees with plan to reduce sertraline .   We will check labs and urine for further possible secondary cause of her hallucinations and also update referral to [facility].  I have advised her friend, Lebron, to reach out to us  if any sudden changes in her behavior.   Consider psychiatric referral if labs unremarkable and no improvement with reduction of  sertraline .  Recommend close f/u in 4-6 weeks or sooner if needed.         Relevant Orders   Urinalysis (Completed)   Basic Metabolic Panel (BMET) (Completed)   B12 and Folate Panel (Completed)   Ambulatory referral to Behavioral Health     Other   Anxiety   Recommend decreasing dose of sertraline  to 50 mg in case her current symptoms are side effect of drug.  Advised to report an increase in anxiety and/or depression.        Relevant Medications   sertraline  (ZOLOFT ) 100 MG tablet   Patient verbally consented to [facility] services about presenting concerns and psychiatric consultation as appropriate. The services will be billed as appropriate for the patient.  No follow-ups on file.    Leita Longs, FNP

## 2024-02-28 ENCOUNTER — Ambulatory Visit: Admitting: Family Medicine

## 2024-02-28 LAB — BASIC METABOLIC PANEL WITH GFR
BUN/Creatinine Ratio: 12 (ref 12–28)
BUN: 9 mg/dL (ref 8–27)
CO2: 28 mmol/L (ref 20–29)
Calcium: 10 mg/dL (ref 8.7–10.3)
Chloride: 100 mmol/L (ref 96–106)
Creatinine, Ser: 0.76 mg/dL (ref 0.57–1.00)
Glucose: 86 mg/dL (ref 70–99)
Potassium: 3.5 mmol/L (ref 3.5–5.2)
Sodium: 141 mmol/L (ref 134–144)
eGFR: 82 mL/min/1.73 (ref >59)

## 2024-02-28 LAB — B12 AND FOLATE PANEL
Folate: 15.9 ng/mL (ref >3.0)
Vitamin B-12: 615 pg/mL (ref 232–1245)

## 2024-03-01 ENCOUNTER — Encounter: Payer: Self-pay | Admitting: *Deleted

## 2024-03-01 ENCOUNTER — Telehealth: Payer: Self-pay | Admitting: *Deleted

## 2024-03-01 LAB — URINALYSIS
Bilirubin, UA: NEGATIVE
Glucose, UA: NEGATIVE
Ketones, UA: NEGATIVE
Leukocytes,UA: NEGATIVE
Nitrite, UA: NEGATIVE
Protein,UA: NEGATIVE
Specific Gravity, UA: 1.02 (ref 1.005–1.030)
Urobilinogen, Ur: 1 mg/dL (ref 0.2–1.0)
pH, UA: 8 — ABNORMAL HIGH (ref 5.0–7.5)

## 2024-03-01 NOTE — Patient Instructions (Signed)
 Tiffany Mendoza - I am sorry I was unable to reach you today for our scheduled appointment. I work with Antonetta Rollene BRAVO, MD and am calling to support your healthcare needs. Please contact me at [phone] at your earliest convenience. I look forward to speaking with you soon.   Thank you,  Rosina Forte, BSN RN [facility], [facility] RN Care Manager Direct Dial: [phone]  Fax: [phone]

## 2024-03-03 ENCOUNTER — Ambulatory Visit: Payer: Self-pay

## 2024-03-03 DIAGNOSIS — R4189 Other symptoms and signs involving cognitive functions and awareness: Secondary | ICD-10-CM | POA: Insufficient documentation

## 2024-03-03 NOTE — Assessment & Plan Note (Signed)
 Recommend decreasing dose of sertraline  to 50 mg in case her current symptoms are side effect of drug.  Advised to report an increase in anxiety and/or depression.

## 2024-03-03 NOTE — Assessment & Plan Note (Addendum)
 The patient feels very strongly that she is being spied on by her neighbors below her.  She moved into an apartment complex 6 months ago.  Within the past two months she has been hearing the voices of her neighbors spying on her, and even believes that they have cameras set up in her apartment in order to spy on her.  She has called out the police multiple times due to these beliefs, but without any concrete evidence, they have been unable to help her.   She has a Child psychotherapist coming out to meet with her tomorrow, which was initiated by police.   I have recommended that she play some background music or the television in order to drown out the voices of her neighbors.  I have also tried to reassure her that there aren't cameras set up in her apartment.   I have also instructed her to decrease the dose of her sertraline  to 50 mg, since her symptoms seemed to coincide with dose increase 2 months ago to 100 mg.  Her tranxene  was also decreased at this time.  Symptoms may be d/t benzo withdrawal? She agrees with plan to reduce sertraline .   We will check labs and urine for further possible secondary cause of her hallucinations and also update referral to [facility].  I have advised her friend, Lebron, to reach out to us  if any sudden changes in her behavior.   Consider psychiatric referral if labs unremarkable and no improvement with reduction of sertraline .  Recommend close f/u in 4-6 weeks or sooner if needed.

## 2024-03-05 ENCOUNTER — Other Ambulatory Visit: Payer: Self-pay

## 2024-03-05 ENCOUNTER — Telehealth: Payer: Self-pay

## 2024-03-05 NOTE — Telephone Encounter (Signed)
 Done

## 2024-03-05 NOTE — Telephone Encounter (Signed)
 Copied from CRM [id number]. Topic: Clinical - Medication Question >> Mar 05, 2024  2:03 PM Emylou G wrote: Reason for CRM: Patient called.. wanted to know if she should start taking the potassium chloride  (KLOR-CON  M) 10 MEQ tablet and how much to take

## 2024-03-07 ENCOUNTER — Encounter: Payer: Self-pay | Admitting: Gastroenterology

## 2024-03-07 ENCOUNTER — Ambulatory Visit (INDEPENDENT_AMBULATORY_CARE_PROVIDER_SITE_OTHER): Admitting: Gastroenterology

## 2024-03-07 VITALS — BP 110/60 | HR 80 | Ht 65.75 in | Wt 155.0 lb

## 2024-03-07 DIAGNOSIS — K5904 Chronic idiopathic constipation: Secondary | ICD-10-CM

## 2024-03-07 DIAGNOSIS — K648 Other hemorrhoids: Secondary | ICD-10-CM

## 2024-03-07 DIAGNOSIS — K649 Unspecified hemorrhoids: Secondary | ICD-10-CM

## 2024-03-07 NOTE — Progress Notes (Signed)
 MIAISABELLA BACORN [id number] [birth year]   Chief Complaint: Constipation  Referring Provider: Antonetta Rollene BRAVO, MD Primary GI MD: Dr. Shila  HPI: KAOIR LOREE is a 73 y.o. female with past medical history of depression, GERD, HTN, insomnia, osteoporosis, constipation, hysterectomy, hemorrhoids s/p banding who presents today for a complaint of constipation.    Last seen in office 09/05/2023 by Dr. Shila.  Small internal hemorrhoids noted at that time on anoscopy.  Was advised to continue MiraLAX and Benefiber and given IBgard samples for use as needed.  Noted to have hypokalemia, likely due to diuretic use, and managed by PCP.  For chronic fatigue syndrome was advised to eat 3 meals daily, start a multivitamin, continue vitamin D3 and consider adding B complex and vitamin C supplements.   Patient states her current regimen of daily Benefiber and MiraLAX has been very helpful for her constipation.  Recently she has been having some issues with privacy at her apartment and this has contributed to some worsening constipation as she does not feel able to use the bathroom whenever she would like.  She has contacted police and is working through some of these issues.  States she does have a supportive community of friends around her.  Typically has a bowel movement in the morning.  Takes a packet of Benefiber and a dose of MiraLAX daily, with an additional dose in the evening as needed.  Reports she has been recovering from previous fatigue and is doing much better.  Has been taking a multivitamin, B complex, vitamin C, and vitamin D3.  Denies any diarrhea, blood in her stool, melena, abdominal pain, nausea, vomiting.  No new concerns at this time.  She does have history of internal hemorrhoids which were examined earlier this year on anoscopy by Dr. Shila.  Occasionally she does notice a protruding hemorrhoid but it is not causing her any pain, discomfort, or bleeding.  She has not  tried any OTC suppositories but will plan to do this as needed.  Previous GI Procedures/Imaging   Colonoscopy 06/15/2017 - Redundant colon. - One 1 mm Hyperplastic polyp in the rectum, removed with a cold biopsy forceps. Resected and retrieved. - Non-bleeding internal hemorrhoids. - Diverticulosis in the sigmoid colon and in the ascending colon. - Recall 10 years   Colonoscopy 02-23-07 Hemorrhoids, External.    Past Medical History:  Diagnosis Date   Adjustment disorder with mixed anxiety and depressed mood    Allergy    Carotid bruit    Depression    Dermatophytosis of foot    GERD (gastroesophageal reflux disease)    Hypertension    Insomnia    Neck pain    Osteoporosis     Past Surgical History:  Procedure Laterality Date   ABDOMINAL HYSTERECTOMY  1988   HEMORRHOID SURGERY     myoectomy  1984    Current Outpatient Medications  Medication Sig Dispense Refill   clorazepate  (TRANXENE -T) 7.5 MG tablet Take one tablet at bedtime , as needed, for  uncontrolled anxiety , and poor sleep. Maximu is 4 times per week 30 tablet 3   hydrochlorothiazide  (HYDRODIURIL ) 25 MG tablet Take 1 tablet by mouth once daily 90 tablet 0   polyethylene glycol powder (GLYCOLAX/MIRALAX) 17 GM/SCOOP powder Take 17 g by mouth daily. Dissolve 1 capful (17g) in 4-8 ounces of liquid and take by mouth daily.     Potassium 99 MG TABS Take 99 mg by mouth 2 times daily at 12 noon and 4 pm.  sertraline  (ZOLOFT ) 100 MG tablet Take 0.5 tablets (50 mg total) by mouth daily.     Wheat Dextrin (BENEFIBER PO) Take 1 Dose by mouth daily.     No current facility-administered medications for this visit.    Allergies as of 03/07/2024   (No Known Allergies)    Family History  Problem Relation Age of Onset   Memory loss Mother    Colon polyps Father    Prostate cancer Father    ALS Father    Thyroid  disease Sister    Thyroid  nodules Sister        Thyroidectomy in 1984   Fibroids Sister    Hypertension  Sister    Pancreatic cancer Maternal Grandfather    Liver cancer Paternal Grandmother    Stomach cancer Neg Hx    Colon cancer Neg Hx    Esophageal cancer Neg Hx     Social History   Tobacco Use   Smoking status: Former    Types: Cigarettes   Smokeless tobacco: Never  Vaping Use   Vaping status: Never Used  Substance Use Topics   Alcohol use: Not Currently    Comment: occasional wine   Drug use: No     Review of Systems:    Constitutional: No fever, chills. Improved fatigue. Cardiovascular: No chest pain Respiratory: No SOB  Gastrointestinal: See HPI and otherwise negative Hematologic: No bleeding or bruising    Physical Exam:  Vital signs: BP 110/60 (BP Location: Left Arm, Patient Position: Sitting, Cuff Size: Normal)   Pulse 80   Ht 5' 5.75 (1.67 m) Comment: height measured without shoes  Wt 155 lb (70.3 kg)   BMI 25.21 kg/m   Constitutional: Pleasant, well-appearing female in NAD, alert and cooperative Head:  Normocephalic and atraumatic.  Eyes: No scleral icterus.  Respiratory: Respirations even and unlabored. Lungs clear to auscultation bilaterally.  No wheezes, crackles, or rhonchi.  Cardiovascular:  Regular rate and rhythm. No murmurs. No peripheral edema. Gastrointestinal:  Soft, nondistended, nontender. No rebound or guarding. Normal bowel sounds. No appreciable masses or hepatomegaly. Rectal:  Not performed.  Neurologic:  Alert and oriented x4;  grossly normal neurologically.  Skin:   Dry and intact without significant lesions or rashes. Psychiatric: Oriented to person, place and time. Demonstrates good judgement and reason without abnormal affect or behaviors.   RELEVANT LABS AND IMAGING: CBC    Component Value Date/Time   WBC 6.2 10/08/2023 1723   RBC 4.10 10/08/2023 1723   HGB 13.2 10/08/2023 1723   HGB 13.4 09/30/2020 1605   HCT 39.4 10/08/2023 1723   HCT 39.6 09/30/2020 1605   PLT 223 10/08/2023 1723   PLT 248 09/30/2020 1605   MCV 96.1  10/08/2023 1723   MCV 91 09/30/2020 1605   MCH 32.2 10/08/2023 1723   MCHC 33.5 10/08/2023 1723   RDW 12.3 10/08/2023 1723   RDW 12.5 09/30/2020 1605   LYMPHSABS 1.9 10/08/2023 1723   LYMPHSABS 2.3 09/30/2020 1605   MONOABS 0.5 10/08/2023 1723   EOSABS 0.2 10/08/2023 1723   EOSABS 0.1 09/30/2020 1605   BASOSABS 0.0 10/08/2023 1723   BASOSABS 0.0 09/30/2020 1605    CMP     Component Value Date/Time   NA 141 02/27/2024 1608   K 3.5 02/27/2024 1608   CL 100 02/27/2024 1608   CO2 28 02/27/2024 1608   GLUCOSE 86 02/27/2024 1608   GLUCOSE 117 (H) 10/08/2023 1723   BUN 9 02/27/2024 1608   CREATININE 0.76 02/27/2024 1608  CREATININE 0.83 04/15/2020 1404   CALCIUM  10.0 02/27/2024 1608   PROT 7.3 10/08/2023 1723   PROT 7.4 09/27/2022 1538   ALBUMIN 3.9 10/08/2023 1723   ALBUMIN 4.4 09/27/2022 1538   AST 29 10/08/2023 1723   ALT 27 10/08/2023 1723   ALKPHOS 39 10/08/2023 1723   BILITOT 0.6 10/08/2023 1723   BILITOT 0.6 09/27/2022 1538   GFRNONAA >60 10/08/2023 1723   GFRNONAA 71 04/15/2020 1404   GFRAA 82 04/15/2020 1404     Assessment/Plan:   Constipation Hemorrhoids Patient seen today for 30-month follow-up of constipation.  At last visit with Dr. Shila in March was advised to continue Benefiber and MiraLAX.  She has been taking 1 packet of Benefiber and 1 dose of MiraLAX daily, with an additional dose as needed.  States that on this regimen she has very regular bowel movements and feels well.  Denies any blood in her stool or melena.  Has recently been having some privacy issues at her apartment which she thinks is contributing to some stress and worsening constipation, but overall doing well.  She does have internal hemorrhoids which were seen on exam at last visit and states that sometimes she does notice a protruding hemorrhoid but it is not causing her any pain/discomfort, and does not bleed. We discussed using OTC suppositories as needed for management of her  hemorrhoids, and advised her to let us  know if she has any new or worsening symptoms. At this time she is doing well and we will plan for follow-up in 6 months. Last colonoscopy 05/2017 with finding of a hyperplastic polyp, redundant colon, diverticulosis, and internal hemorrhoids with recommended recall in 10 years.  Should she have any new or worsening symptoms could consider earlier repeat colonoscopy.  - Continue daily Benefiber - Continue daily MiraLAX - Use OTC suppositories for hemorrhoids as needed - Follow-up in 6 months - Contact the office if any new or worsening symptoms - Due for repeat colonoscopy 05/2027  Camie Furbish, PA-C [facility] 03/07/2024, 5:00 PM  Patient Care Team: Antonetta Rollene BRAVO, MD as PCP - General (Family Medicine) Roz Anes, MD as Consulting Physician (Ophthalmology) Reida Redell PARAS as Counselor (Professional Counselor) Shila Gustav GAILS, MD as Consulting Physician (Gastroenterology) Darlean Ozell NOVAK, MD as Consulting Physician (Pulmonary Disease) Bertrum Rosina HERO, RN as [facility] Care Management Maranda Tobias BIRCH as Social Worker

## 2024-03-07 NOTE — Patient Instructions (Signed)
 Continue daily fiber supplement.  Drink plenty of water daily.   Continue daily Miralax.  You can use OTC suppositories for hemorrhoids as needed.  _______________________________________________________  If your blood pressure at your visit was 140/90 or greater, please contact your primary care physician to follow up on this.  _______________________________________________________  If you are age 75 or older, your body mass index should be between 23-30. Your Body mass index is 25.21 kg/m. If this is out of the aforementioned range listed, please consider follow up with your Primary Care Provider.  If you are age 29 or younger, your body mass index should be between 19-25. Your Body mass index is 25.21 kg/m. If this is out of the aformentioned range listed, please consider follow up with your Primary Care Provider.   ________________________________________________________  The [facility] providers would like to encourage you to use MYCHART to communicate with providers for non-urgent requests or questions.  Due to long hold times on the telephone, sending your provider a message by [facility] may be a faster and more efficient way to get a response.  Please allow 48 business hours for a response.  Please remember that this is for non-urgent requests.  _______________________________________________________  [facility] is using a team-based approach to care.  Your team is made up of your doctor and two to three APPS. Our APPS (Nurse Practitioners and Physician Assistants) work with your physician to ensure care continuity for you. They are fully qualified to address your health concerns and develop a treatment plan. They communicate directly with your gastroenterologist to care for you. Seeing the Advanced Practice Practitioners on your physician's team can help you by facilitating care more promptly, often allowing for earlier appointments, access to diagnostic testing,  procedures, and other specialty referrals.

## 2024-03-08 ENCOUNTER — Ambulatory Visit (HOSPITAL_COMMUNITY): Admission: EM | Admit: 2024-03-08 | Discharge: 2024-03-08 | Disposition: A | Discharge status: Home / Self Care

## 2024-03-08 DIAGNOSIS — F4325 Adjustment disorder with mixed disturbance of emotions and conduct: Secondary | ICD-10-CM | POA: Diagnosis not present

## 2024-03-08 DIAGNOSIS — R5382 Chronic fatigue, unspecified: Secondary | ICD-10-CM | POA: Diagnosis not present

## 2024-03-08 DIAGNOSIS — F411 Generalized anxiety disorder: Secondary | ICD-10-CM | POA: Diagnosis not present

## 2024-03-08 DIAGNOSIS — F329 Major depressive disorder, single episode, unspecified: Secondary | ICD-10-CM | POA: Diagnosis not present

## 2024-03-08 DIAGNOSIS — Z79899 Other long term (current) drug therapy: Secondary | ICD-10-CM | POA: Insufficient documentation

## 2024-03-08 DIAGNOSIS — F419 Anxiety disorder, unspecified: Secondary | ICD-10-CM | POA: Diagnosis present

## 2024-03-08 DIAGNOSIS — I10 Essential (primary) hypertension: Secondary | ICD-10-CM | POA: Diagnosis not present

## 2024-03-08 DIAGNOSIS — Z818 Family history of other mental and behavioral disorders: Secondary | ICD-10-CM | POA: Insufficient documentation

## 2024-03-08 DIAGNOSIS — F4322 Adjustment disorder with anxiety: Secondary | ICD-10-CM | POA: Diagnosis not present

## 2024-03-08 NOTE — BHH Group Notes (Signed)
 SPIRITUALITY GROUP NOTE  Spirituality group facilitated by Chaplain Solana Coggin, MDiv, BCC.  Group Description:  Group focused on topic of hope.  Patients participated in facilitated discussion around topic, connecting with one another around experiences and definitions for hope.  Group members engaged with visual explorer photos, reflecting on what hope looks like for them today.  Group engaged in discussion around how their definitions of hope are present today in hospital.   Modalities: Psycho-social ed, Adlerian, Narrative, MI Patient Progress: DID NOT ATTEND

## 2024-03-08 NOTE — Discharge Instructions (Addendum)
 Based on the information that you have provided and the presenting issues outpatient Tailored Care Management through [facility] have been recommended.  It is imperative that you follow through with treatment recommendations within 5-7 days from the of discharge to mitigate further risk to your safety and mental well-being.  In  case of an urgent crisis, you may contact the Mobile Crisis Unit with [facility] at [phone].   What is Tailored Care Management (TCM)? Tailored Care Management is an important part [employer]'s Health Plan. Tailored Care Management provides whole-person care from all health care providers. Whole person care brings together all of a person's needs, including behavioral health, physical health, pharmacy, and unmet health-related resource needs. Tailored Care Management means better health outcomes for our members.  [facility] will help match you to a Care Manager that has specialized training to meet your needs. You may change your Care Manager twice a year for any reason and at any time with a good reason. You can choose not to have a Care Manager at any time by calling Member and Recipient Services at [phone] or completing the form in the Member Portal.  Member Choice in TCM You have a choice in where you receive Tailored Care Management:  Care Management Agencies [facility]): provider organizations with experience providing behavioral health, intellectual/developmental disability, and/or traumatic brain injury services to our population.  [facility] ([facility]): primary care practices whose providers have experience providing primary care services to our population. Here are some things to think about when you choose how you get Tailored Care Management: Where you live.  Your specific health care needs. The providers you are seeing now. How serious are your physical medical needs are.

## 2024-03-08 NOTE — Progress Notes (Signed)
   03/08/24 1435  [facility] Triage Screening (Walk-ins at [facility] only)  How Did You Hear About Us ? Legal System  What Is the Reason for Your Visit/Call Today? Tiffany Mendoza is a 73 year old female presenting to [facility] escorted by [employer]. Pt appears to be very talkative and is rambling throughout triage. Pt begins to ramble about how her mother had passed in March 28, 2022, and she has not been the same since. Pt was unable to elaborate on if she is seeing a therapist or taking medication. Pt began to go off on a tangent about how she was recently living in her apartment. Pt appears to be slightly disorganized with her thoughts. Pt is looking to speak to a therapist at this time. Pt denies substance use, Si, Hi and AVH. Pts appearance is neat, motor activity is normal, eye contact is normal, affect is full.  How Long Has This Been Causing You Problems? > than 6 months  Have You Recently Had Any Thoughts About Hurting Yourself? No  Are You Planning to Commit Suicide/Harm Yourself At This time? No  Have you Recently Had Thoughts About Hurting Someone Sherral? No  Are You Planning To Harm Someone At This Time? No  Self-Neglect Denies  Possible abuse reported to: Other (Comment)  Are you currently experiencing any auditory, visual or other hallucinations? No  Have You Used Any Alcohol or Drugs in the Past 24 Hours? No  Do you have any current medical co-morbidities that require immediate attention? No  What Do You Feel Would Help You the Most Today? Stress Management;Social Support  If access to [facility] was not available, would you have sought care in the Emergency Department? No  Determination of Need Routine (7 days)  Options For Referral Outpatient Therapy

## 2024-03-08 NOTE — BH Assessment (Signed)
 Comprehensive Clinical Assessment (CCA) Note  03/08/2024 Tiffany Mendoza [id number]  DISPOSITION: Per Rolin Lipps NP pt is recommended for overnight observation in the [facility] Observation Unit.   The patient demonstrates the following risk factors for suicide: Chronic risk factors for suicide include: N/A. Acute risk factors for suicide include: UTA- none reported. Protective factors for this patient include: hope for the future. Considering these factors, the overall suicide risk at this point appears to be low. Patient is appropriate for outpatient follow up.   Per Triage assessment: "Tiffany Mendoza is a 73 year old female presenting to [facility] escorted by [employer]. Pt appears to be very talkative and is rambling throughout triage. Pt begins to ramble about how her mother had passed in 04/10/22, and she has not been the same since. Pt was unable to elaborate on if she is seeing a therapist or taking medication. Pt began to go off on a tangent about how she was recently living in her apartment. Pt appears to be slightly disorganized with her thoughts. Pt is looking to speak to a therapist at this time. Pt denies substance use, Si, Hi and AVH. Pts appearance is neat, motor activity is normal, eye contact is normal, affect is full."  With further assessment: Pt is a 73 yo female who presented via [employer] with tangential thinking and speech and altered mental status. Pt was not able to answer many questions appropriately due to her AMS. Pt seemed fixated on the death of her mother in 04/10/22 which she identified as the event which was a turning point for her and her mental health. Pt also seems fixated in the assessment and per hx (for weeks minimally) that her neighbors are talking about her (she says she can hear them through the walls), "conspiring" against her, "spying and bugging" her apartment and voicing other similar paranoid delusions. Doctor visits on 02/27/24 and 02/15/24 contain the same delusional thinking.    Pt lives alone and does not have or cannot name any person for support besides a woman who in some capacity manages her apartment complex.     Chief Complaint:  Chief Complaint  Patient presents with   Evaluation   Visit Diagnosis:  Brief Psychotic d/o    CCA Screening, Triage and Referral (STR)  Patient Reported Information How did you hear about us ? Legal System  What Is the Reason for Your Visit/Call Today? Tiffany Mendoza is a 73 year old female presenting to [facility] escorted by [employer]. Pt appears to be very talkative and is rambling throughout triage. Pt begins to ramble about how her mother had passed in 2022-04-10, and she has not been the same since. Pt was unable to elaborate on if she is seeing a therapist or taking medication. Pt began to go off on a tangent about how she was recently living in her apartment. Pt appears to be slightly disorganized with her thoughts. Pt is looking to speak to a therapist at this time. Pt denies substance use, Si, Hi and AVH. Pts appearance is neat, motor activity is normal, eye contact is normal, affect is full.  How Long Has This Been Causing You Problems? > than 6 months  What Do You Feel Would Help You the Most Today? Stress Management; Social Support   Have You Recently Had Any Thoughts About Hurting Yourself? No  Are You Planning to Commit Suicide/Harm Yourself At This time? No   Flowsheet Row ED from 03/08/2024 in [facility] from  10/20/2023 in [facility] ED from 10/08/2023 in [facility] Emergency Department at [facility]  C-SSRS RISK CATEGORY No Risk No Risk No Risk    Have you Recently Had Thoughts About Hurting Someone Sherral? No  Are You Planning to Harm Someone at This Time? No  Explanation: na  Have You Used Any Alcohol or Drugs in the Past 24 Hours? No  How Long Ago Did You Use Drugs or Alcohol? na What Did You Use and How Much? na  Do  You Currently Have a Therapist/Psychiatrist? -- (Pt was not able to answer many questions appropriately due to her AMS.)  Name of Therapist/Psychiatrist:    Have You Been Recently Discharged From Any Office Practice or Programs? -- (Pt was not able to answer many questions appropriately due to her AMS. Does not appear so from hx.)  Explanation of Discharge From Practice/Program: na    CCA Screening Triage Referral Assessment Type of Contact: Face-to-Face  Telemedicine Service Delivery:   Is this Initial or Reassessment?   Date Telepsych consult ordered in [facility]:    Time Telepsych consult ordered in [facility]:    Location of Assessment: [facility] Assessment Services  Provider Location: [facility] Assessment Services   Collateral Involvement: none available at this time   Does Patient Have a Automotive engineer Guardian? No (none reported)  Legal Guardian Contact Information: na  Copy of Legal Guardianship Form: -- (na)  Legal Guardian Notified of Arrival: -- (na)  Legal Guardian Notified of Pending Discharge: -- (na)  If Minor and Not Living with Parent(s), Who has Custody? adult  Is CPS involved or ever been involved? -- (none reported - Pt was not able to answer many questions appropriately due to her AMS.)  Is APS involved or ever been involved? -- (none reported - Pt was not able to answer many questions appropriately due to her AMS.)   Patient Determined To Be At Risk for Harm To Self or Others Based on Review of Patient Reported Information or Presenting Complaint? No  Method: No Plan  Availability of Means: No access or NA  Intent: Vague intent or NA  Notification Required: No need or identified person  Additional Information for Danger to Others Potential: Active psychosis (possible active psychosis)  Additional Comments for Danger to Others Potential: na  Are There Guns or Other Weapons in Your Home? -- (none reported - Pt was not able to answer many questions  appropriately due to her AMS.)  Types of Guns/Weapons: na  Are These Weapons Safely Secured?                            -- (na)  Who Could Verify You Are Able To Have These Secured: na  Do You Have any Outstanding Charges, Pending Court Dates, Parole/Probation? none reported - Pt was not able to answer many questions appropriately due to her AMS.  Contacted To Inform of Risk of Harm To Self or Others: -- (na)    Does Patient Present under Involuntary Commitment? No    [place] of Residence: [place]   Patient Currently Receiving the Following Services: Not Receiving Services   Determination of Need: Urgent (48 hours) (Per Rolin Lipps NP pt is recommended for overnight observation at the [facility] OBS unit)   Options For Referral: [facility]gent [facility]     CCA Biopsychosocial Patient Reported Schizophrenia/Schizoaffective Diagnosis in Past: No   Strengths: able to accept  help, independent   Mental Health Symptoms Depression:  -- (none reported - Pt was not able to answer many questions appropriately due to her AMS.)   Duration of Depressive symptoms:    Mania:  -- (none reported or observed - Pt was not able to answer many questions appropriately due to her AMS.)   Anxiety:   Worrying; Sleep; Restlessness; Difficulty concentrating   Psychosis:  Delusions   Duration of Psychotic symptoms: Duration of Psychotic Symptoms: -- (Pt was not able to answer many questions appropriately due to her AMS.)   Trauma:  -- (none reported - Pt was not able to answer many questions appropriately due to her AMS.)   Obsessions:  Cause anxiety; Disrupts routine/functioning; Intrusive/time consuming; Poor insight; Recurrent & persistent thoughts/impulses/images (delusiona concerning her neighbors activities toward her)   Compulsions:  None   Inattention:  N/A   Hyperactivity/Impulsivity:  N/A   Oppositional/Defiant Behaviors:  N/A   Emotional Irregularity:  Mood lability   Other  Mood/Personality Symptoms:  none observed    Mental Status Exam Appearance and self-care  Stature:  Average   Weight:  Average weight   Clothing:  Casual   Grooming:  Normal   Cosmetic use:  Age appropriate   Posture/gait:  Normal   Motor activity:  Not Remarkable   Sensorium  Attention:  Confused; Inattentive; Vigilant (labile)   Concentration:  Focuses on irrelevancies; Preoccupied   Orientation:  Object; Person; Place; Time   Recall/memory:  Defective in Immediate; Defective in Short-term; Defective in Recent; Defective in Remote (delusions interfere)   Affect and Mood  Affect:  Full Range; Anxious   Mood:  Anxious; Irritable   Relating  Eye contact:  Normal   Facial expression:  Responsive; Tense   Attitude toward examiner:  Cooperative; Argumentative; Defensive   Thought and Language  Speech flow: Flight of Ideas; Clear and Coherent (labile)   Thought content:  Delusions   Preoccupation:  Ruminations (concerning her neighbors activities)   Hallucinations:  -- (none reported - Pt was not able to answer many questions appropriately due to her AMS.)   Organization:  Loose   Company secretary of Knowledge:  Average   Intelligence:  Average   Abstraction:  Functional   Judgement:  Impaired   Reality Testing:  Distorted   Insight:  Flashes of insight; Lacking; Gaps   Decision Making:  Confused; Vacilates (Pt was not able to answer many questions appropriately due to her AMS.)   Social Functioning  Social Maturity:  -- (unable to determine based on AMS)   Social Judgement:  -- (unable to determine based on AMS)   Stress  Stressors:  Housing; Relationship   Coping Ability:  Exhausted; Overwhelmed   Skill Deficits:  Decision making; Interpersonal; Self-control   Supports:  -- (none reported - Pt was not able to answer many questions appropriately due to her AMS.)     Religion: Religion/Spirituality Are You A Religious Person?:   (none reported - Pt was not able to answer many questions appropriately due to her AMS.) How Might This Affect Treatment?: uta - none reported - Pt was not able to answer many questions appropriately due to her AMS.  Leisure/Recreation: Leisure / Recreation Do You Have Hobbies?:  (none reported - Pt was not able to answer many questions appropriately due to her AMS.)  Exercise/Diet: Exercise/Diet Do You Exercise?:  (none reported - Pt was not able to answer many questions appropriately due to her AMS.) Have You  Gained or Lost A Significant Amount of Weight in the Past Six Months?:  (none reported - Pt was not able to answer many questions appropriately due to her AMS.) Do You Follow a Special Diet?:  (none reported - Pt was not able to answer many questions appropriately due to her AMS.) Do You Have Any Trouble Sleeping?:  (none reported - Pt was not able to answer many questions appropriately due to her AMS.)   CCA Employment/Education Employment/Work Situation: Employment / Work Situation Employment Situation: Retired (Status is unclear- none reported - Pt was not able to answer many questions appropriately due to her AMS.) Patient's Job has Been Impacted by Current Illness:  (none reported - Pt was not able to answer many questions appropriately due to her AMS.) Has Patient ever Been in the [employer]?:  (none reported - Pt was not able to answer many questions appropriately due to her AMS.)  Education: Education Is Patient Currently Attending School?: No Last Grade Completed:  (Pt was not able to answer many questions appropriately due to her AMS.) Did You Attend College?:  (none reported - Pt was not able to answer many questions appropriately due to her AMS.) Did You Have An Individualized Education Program (IIEP):  (none reported - Pt was not able to answer many questions appropriately due to her AMS.) Did You Have Any Difficulty At School?:  (none reported - Pt was not able to  answer many questions appropriately due to her AMS.) Patient's Education Has Been Impacted by Current Illness:  (none reported - Pt was not able to answer many questions appropriately due to her AMS.)   CCA Family/Childhood History Family and Relationship History: Family history Marital status: Single (Status not clear but living alone)  Childhood History:  Childhood History By whom was/is the patient raised?:  (none reported - Pt was not able to answer many questions appropriately due to her AMS.) Did patient suffer any verbal/emotional/physical/sexual abuse as a child?:  (none reported - Pt was not able to answer many questions appropriately due to her AMS.) Did patient suffer from severe childhood neglect?:  (none reported - Pt was not able to answer many questions appropriately due to her AMS.) Has patient ever been sexually abused/assaulted/raped as an adolescent or adult?:  (none reported - Pt was not able to answer many questions appropriately due to her AMS.) Was the patient ever a victim of a crime or a disaster?:  (none reported - Pt was not able to answer many questions appropriately due to her AMS.) Witnessed domestic violence?:  (none reported - Pt was not able to answer many questions appropriately due to her AMS.) Has patient been affected by domestic violence as an adult?:  (none reported - Pt was not able to answer many questions appropriately due to her AMS.)       CCA Substance Use Alcohol/Drug Use: Alcohol / Drug Use Pain Medications: see MAR Prescriptions: see MAR Over the Counter: see MAR History of alcohol / drug use?: No history of alcohol / drug abuse (none reported - Pt was not able to answer many questions appropriately due to her AMS.) Longest period of sobriety (when/how long):  (Pt was not able to answer many questions appropriately due to her AMS.)                         ASAM's:  Six Dimensions of Multidimensional Assessment  Dimension 1:   Acute Intoxication and/or Withdrawal Potential:  Dimension 2:  Biomedical Conditions and Complications:      Dimension 3:  Emotional, Behavioral, or Cognitive Conditions and Complications:     Dimension 4:  Readiness to Change:     Dimension 5:  Relapse, Continued use, or Continued Problem Potential:     Dimension 6:  Recovery/Living Environment:     ASAM Severity Score:    ASAM Recommended Level of Treatment:     Substance use Disorder (SUD)    Recommendations for Services/Supports/Treatments:    Disposition Recommendation per psychiatric provider: We recommend transfer to [facility]. For overnight observation in the [facility] Observation Unit per Rolin Lipps NP   DSM5 Diagnoses: Patient Active Problem List   Diagnosis Date Noted   Alteration in cognition 03/03/2024   Depression, major, single episode, moderate (HCC) 09/29/2023   DOE (dyspnea on exertion) 09/29/2023   Lung nodule seen on imaging study 09/10/2023   Abnormal CT scan, chest 09/10/2023   Low serum potassium 08/31/2023   Vitamin D  deficiency 04/03/2023   Mixed hyperlipidemia 02/17/2023   Coronary artery disease due to lipid rich plaque 02/17/2023   Financial insecurity 02/13/2022   Housing insecurity 02/10/2022   Essential hypertension 11/13/2021   Encounter for examination following treatment at hospital 09/26/2021   Hemorrhoids 11/10/2020   Anxiety 04/06/2019   Cutaneous skin tags 10/28/2016   Elevated serum immunoglobulin free light chain level 10/20/2016   Fatigue 12/08/2009   Mixed anxiety and depressive disorder 07/09/2009     Referrals to Alternative Service(s): Referred to Alternative Service(s):   Place:   Date:   Time:    Referred to Alternative Service(s):   Place:   Date:   Time:    Referred to Alternative Service(s):   Place:   Date:   Time:    Referred to Alternative Service(s):   Place:   Date:   Time:     Winifred Bodiford T, Counselor

## 2024-03-08 NOTE — ED Provider Notes (Signed)
 Behavioral Health Urgent Care Medical Screening Exam  Patient Name: Tiffany Mendoza MRN: [id number] Date of Evaluation: 03/09/24 Chief Complaint: I just want my privacy back  Diagnosis:  Final diagnoses:  Anxiety state  Adjustment disorder with anxious mood    History of Present Illness: Tiffany Mendoza is a 73 y.o. female with a history significant for MDD, GAD, chronic fatigue and HTN with complaints of I just want my privacy back. Patient presented to [facility] voluntarily via [employer] transport. Patient seen face to face by this provider, consulted with Dr. Lawrnce; and chart reviewed on 03/09/24. Patient appeared anxious with tangential speech, which complicated the ability to gather some information. On evaluation, Tiffany Mendoza reported that she's has been having difficulty with her current living situation. Patient reported experiencing housing and financial insecurities after the death of her mother and recently secured housing in June 2025. She reported living with her mother for the last 17 years and is currently living in an apartment independently. Patient reported concerns for the poor construction of her apartment, citing thin walls that allow her to hear her neighbors conversations. Patient reported concerns that her neighbors are spying on her, can hear her conversations, are aware of her location, and overall invading her privacy. She reported this has caused feelings of overwhelm and anxiety, most notably over the last 1-2 months. Patient reported the ability to hear her neighbors has also caused sleep difficulties as she has difficulty falling asleep and staying asleep due to the noise. Per chart review, patient presented to her PCP on 02/27/2024 due to similar concerns. During this appointment, labs were collected and were unremarkable. Patient was also advised to decrease her sertraline  from 100 mg to 50 mg due to concerns of correlation between increase in anxiety and dose  increase 2 months ago. Regarding her mood, she reported she has been feeling pretty good, it's just this anxiety. Although she denied depressive symptoms, she did endorse intermittent feelings of grief related to the death of her mother. She denied access to firearms and substance use. Patient denied a history of psychiatric hospitalizations, suicidal ideation, suicide attempts, and/or self injurious behavior. Patient reported family history of dementia (mother). Per chart review, patient is currently prescribed hydrochlorothiazide  25 mg daily, potassium chloride  ER 10 mEQ BID, sertraline  50 mg daily, and clorazepate  7.5 mg at bedtime as needed (maximum dose 4x/week). Patient reports adherence with medication and taking as prescribed. Patient was previously established with a therapist and medication provider through [facility], but has not established care since relocating to [place]. During evaluation, [name] is sitting in no acute distress. She is alert/oriented x 4; anxious, but cooperative; and mood congruent with affect. She is speaking in a clear tone at moderate volume, and normal pace; with good eye contact. There is no objective indication that she is currently responding to internal/external stimuli, and she has denied suicidal/self-harm/homicidal ideation.   At this time [name] is educated and verbalizes understanding of mental health resources and other crisis services in the community. She is instructed to call 911 and present to the nearest emergency room should she experience any suicidal/homicidal ideation, auditory/visual/hallucinations, or detrimental worsening of her mental health condition. She was a also advised by Clinical research associate that she could call the toll-free phone on insurance card to assist with identifying in network counselors and agencies or number on back of Medicaid card to speak with care coordinator. Patient verbalized understanding of  information presented.  Flowsheet Row ED from 03/08/2024 in [facility] Integrated Behavioral Health from 10/20/2023 in [facility] Primary Care ED from 10/08/2023 in [facility] Emergency Department at [facility]  C-SSRS RISK CATEGORY No Risk No Risk No Risk    Psychiatric Specialty Exam  Presentation  General Appearance:Appropriate for Environment  Eye Contact:Good  Speech:Normal Rate  Speech Volume:Normal  Handedness:Right   Mood and Affect  Mood:Anxious  Affect:Appropriate   Thought Process  Thought Processes:Coherent  Descriptions of Associations:Tangential  Orientation:Full (Time, Place and Person)  Thought Content:Tangential  Diagnosis of Schizophrenia or Schizoaffective disorder in past: No  Suicidal Thoughts:No  Homicidal Thoughts:No   Sensorium  Memory:Immediate Fair; Recent Fair  Judgment:Fair  Insight:Fair   Executive Functions  Concentration:Fair  Attention Span:Fair  Recall:Fair  Fund of Knowledge:Fair  Language:Fair   Psychomotor Activity  Psychomotor Activity:Normal   Assets  Assets:Housing   Sleep  Sleep:Fair  Number of hours: 7   Physical Exam: Physical Exam HENT:     Head: Normocephalic.  Cardiovascular:     Rate and Rhythm: Normal rate.  Pulmonary:     Effort: Pulmonary effort is normal.  Neurological:     Mental Status: She is alert and oriented to person, place, and time.  Psychiatric:        Mood and Affect: Mood is anxious.        Speech: Speech is tangential.        Behavior: Behavior is cooperative.        Thought Content: Thought content is paranoid.    Review of Systems  Constitutional: Negative.   HENT: Negative.    Respiratory: Negative.    Neurological: Negative.   Psychiatric/Behavioral:  The patient is nervous/anxious.    Blood pressure 116/89, pulse 89, temperature 98.9 F (37.2 C), temperature source Oral, resp. rate 20, SpO2 99%. There  is no height or weight on file to calculate BMI.  Musculoskeletal: Strength & Muscle Tone: within normal limits Gait & Station: normal Patient leans: N/A   [facility] MSE Discharge Disposition for Follow up and Recommendations: Patient presents with paranoid symptoms that appear to be in the context of heightened anxiety and a recent transition to a new living situation. Chart review indicates no prior endorsement of similar symptoms before the move. Patient was educated on the importance of taking medications as prescribed and maintaining regular follow-up with their provider. Psychoeducation was provided on the risks of long term benzodiazepine use, as well as potential consequences of abrupt dose reduction or discontinuation. Patient verbalized understanding of the information presented. Patient reported taking medication as prescribed and denied decrease or discontinuation of prescribed benzodiazepine. Encouraged patient to follow up with therapist and case management services for additional support. At this time, patient does not meet criteria for inpatient hospitalization, as she does not present a danger to herself or others. Case consulted with Dr. Lawrnce. Based on my evaluation the patient does not appear to have an emergency medical condition and can be discharged with resources and follow up care in outpatient services for Medication Management and Individual Therapy   Paisli Silfies A Christyn Gutkowski, NP 03/09/2024, 12:38 PM

## 2024-03-09 ENCOUNTER — Encounter: Payer: Self-pay | Admitting: Gastroenterology

## 2024-03-14 ENCOUNTER — Ambulatory Visit: Admitting: Internal Medicine

## 2024-03-19 ENCOUNTER — Other Ambulatory Visit: Payer: Self-pay | Admitting: *Deleted

## 2024-03-19 DIAGNOSIS — Z789 Other specified health status: Secondary | ICD-10-CM

## 2024-03-19 NOTE — Patient Outreach (Signed)
 Complex Care Management   Visit Note  03/19/2024  Name:  Tiffany Mendoza MRN: [id number] DOB: 20-Sep-1950  Situation: Referral received for Complex Care Management related to HTN I obtained verbal consent from Patient.  Visit completed with Patient  on the phone  Background:   Past Medical History:  Diagnosis Date   Adjustment disorder with mixed anxiety and depressed mood    Allergy    Carotid bruit    Depression    Dermatophytosis of foot    GERD (gastroesophageal reflux disease)    Hypertension    Insomnia    Neck pain    Osteoporosis     Assessment: Patient Reported Symptoms:  Cognitive Cognitive Status: Difficulties with attention and concentration   Health Maintenance Behaviors: Annual physical exam Healing Pattern: Average Health Facilitated by: Rest  Neurological Neurological Review of Symptoms: No symptoms reported Neurological Management Strategies: Routine screening Neurological Self-Management Outcome: 4 (good)  HEENT HEENT Symptoms Reported: No symptoms reported HEENT Management Strategies: Routine screening HEENT Self-Management Outcome: 4 (good)    Cardiovascular Cardiovascular Symptoms Reported: Fatigue Does patient have uncontrolled Hypertension?: No Cardiovascular Self-Management Outcome: 4 (good)  Respiratory Respiratory Symptoms Reported: No symptoms reported Respiratory Management Strategies: Routine screening Respiratory Self-Management Outcome: 4 (good)  Endocrine Endocrine Symptoms Reported: No symptoms reported Is patient diabetic?: No Endocrine Self-Management Outcome: 4 (good)  Gastrointestinal Gastrointestinal Symptoms Reported: No symptoms reported Gastrointestinal Self-Management Outcome: 4 (good) Nutrition Risk Screen (CP): No indicators present  Genitourinary Genitourinary Symptoms Reported: No symptoms reported Genitourinary Self-Management Outcome: 4 (good)  Integumentary Integumentary Symptoms Reported: No symptoms reported Skin  Self-Management Outcome: 4 (good)  Musculoskeletal Musculoskelatal Symptoms Reviewed: Not assessed Musculoskeletal Self-Management Outcome: 4 (good) Falls in the past year?: No Number of falls in past year: 1 or less Was there an injury with Fall?: No Fall Risk Category Calculator: 0 Patient Fall Risk Level: Low Fall Risk Patient at Risk for Falls Due to: No Fall Risks Fall risk Follow up: Falls evaluation completed  Psychosocial Psychosocial Symptoms Reported: Not assessed          03/19/2024    PHQ2-9 Depression Screening   Little interest or pleasure in doing things    Feeling down, depressed, or hopeless    PHQ-2 - Total Score    Trouble falling or staying asleep, or sleeping too much    Feeling tired or having little energy    Poor appetite or overeating     Feeling bad about yourself - or that you are a failure or have let yourself or your family down    Trouble concentrating on things, such as reading the newspaper or watching television    Moving or speaking so slowly that other people could have noticed.  Or the opposite - being so fidgety or restless that you have been moving around a lot more than usual    Thoughts that you would be better off dead, or hurting yourself in some way    PHQ2-9 Total Score    If you checked off any problems, how difficult have these problems made it for you to do your work, take care of things at home, or get along with other people    Depression Interventions/Treatment      There were no vitals filed for this visit.  Medications Reviewed Today     Reviewed by Bertrum Rosina HERO, RN (Registered Nurse) on 03/19/24 at 1039  Med List Status: <None>   Medication Order Taking? Sig Documenting Provider Last Dose Status  Informant  clorazepate  (TRANXENE -T) 7.5 MG tablet [id number] Yes Take one tablet at bedtime , as needed, for  uncontrolled anxiety , and poor sleep. Maximu is 4 times per week Antonetta Rollene BRAVO, MD  Active   hydrochlorothiazide   (HYDRODIURIL ) 25 MG tablet [id number] Yes Take 1 tablet by mouth once daily [name], [name], MD  Active   polyethylene glycol powder [facility]) 17 GM/SCOOP powder [id number] Yes Take 17 g by mouth daily. Dissolve 1 capful (17g) in 4-8 ounces of liquid and take by mouth daily. [provider]  Active   Potassium 99 MG TABS [id number] Yes Take 99 mg by mouth 2 times daily at 12 noon and 4 pm. [provider]  Active   sertraline  (ZOLOFT ) 100 MG tablet [id number] Yes Take 0.5 tablets (50 mg total) by mouth daily. Bevely Doffing, FNP  Active   Wheat Dextrin (BENEFIBER PO) [id number] Yes Take 1 Dose by mouth daily. [provider]  Active             Recommendation:   Continue Current Plan of Care  Follow Up Plan:   Telephone follow-up in 1 month  Rosina Forte, BSN RN [facility], [facility] Health RN Care Manager Direct Dial: [phone]  Fax: [phone]

## 2024-03-19 NOTE — Patient Instructions (Signed)
 Visit Information  Thank you for taking time to visit with me today. Please don't hesitate to contact me if I can be of assistance to you before our next scheduled appointment.  Your next care management appointment is no further scheduled appointments.    Telephone follow-up in 1 month  Please call the care guide team at [phone] if you need to cancel, schedule, or reschedule an appointment.   Please call the Suicide and Crisis Lifeline: 988 call the [place]  National Suicide Prevention Lifeline: 859-037-4043 or TTY: 938-848-0457 TTY 820-643-0148) to talk to a trained counselor call 1-800-273-TALK (toll free, 24 hour hotline) if you are experiencing a Mental Health or Behavioral Health Crisis or need someone to talk to.  Rosina Forte, BSN RN [facility], [facility] Health RN Care Manager Direct Dial: [phone]  Fax: [phone]

## 2024-03-21 ENCOUNTER — Telehealth

## 2024-03-21 NOTE — Patient Instructions (Signed)
 Montie GRADE Friend - I am sorry I was unable to reach you today for our scheduled appointment. I work with Antonetta Rollene BRAVO, MD and am calling to support your healthcare needs. Please contact me at [phone] at your earliest convenience. I look forward to speaking with you soon.   Thank you,  Tillman Gardener, BSW [place]  [facility], [facility] Social Worker Direct Dial: [phone]  Fax: [phone] Website: [email or web address]

## 2024-04-01 ENCOUNTER — Telehealth: Payer: Self-pay

## 2024-04-01 NOTE — Patient Instructions (Signed)
 Montie GRADE Ruben - I have attempted to call you three times but have been unsuccessful in reaching you. I work with Antonetta Rollene BRAVO, MD and am calling to support your healthcare needs. If I can be of assistance to you, please contact me at [phone].     Thank you,  Tillman Gardener, BSW [place]  [facility], [facility] Social Worker Direct Dial: [phone]  Fax: [phone] Website: [email or web address]

## 2024-04-02 ENCOUNTER — Encounter

## 2024-04-03 ENCOUNTER — Encounter: Payer: Self-pay | Admitting: Pediatric Intensive Care

## 2024-04-03 NOTE — Congregational Nurse Program (Signed)
  Dept: [phone]   Congregational Nurse Program Note  Date of Encounter: 04/03/2024  Past Medical History: Past Medical History:  Diagnosis Date   Adjustment disorder with mixed anxiety and depressed mood    Allergy    Carotid bruit    Depression    Dermatophytosis of foot    GERD (gastroesophageal reflux disease)    Hypertension    Insomnia    Neck pain    Osteoporosis     Encounter Details:  Community Questionnaire - 04/03/24 1533       Questionnaire   Ask client: Do you give verbal consent for me to treat you today? Yes    Student Assistance N/A    Location Patient [facility]    Encounter Setting CN site    Population Status Unknown    Insurance Medicare;Medicaid    Insurance/Financial Assistance Referral N/A    Medication N/A    Medical Provider Yes    Screening Referrals Made N/A    Medical Referrals Made N/A    Medical Appointment Completed N/A    CNP Interventions Advocate/Support    Screenings CN Performed Blood Pressure    ED Visit Averted N/A    Life-Saving Intervention Made N/A         BP Dept: [phone]   Congregational Nurse Program Note  Date of Encounter: 04/03/2024  Past Medical History: Past Medical History:  Diagnosis Date   Adjustment disorder with mixed anxiety and depressed mood    Allergy    Carotid bruit    Depression    Dermatophytosis of foot    GERD (gastroesophageal reflux disease)    Hypertension    Insomnia    Neck pain    Osteoporosis     Encounter Details:  Community Questionnaire - 04/03/24 1533       Questionnaire   Ask client: Do you give verbal consent for me to treat you today? Yes    Student Assistance N/A    Location Patient [facility]    Encounter Setting CN site    Population Status Unknown    Insurance Medicare;Medicaid    Insurance/Financial Assistance Referral N/A    Medication N/A    Medical Provider Yes    Screening Referrals Made N/A    Medical  Referrals Made N/A    Medical Appointment Completed N/A    CNP Interventions Advocate/Support    Screenings CN Performed Blood Pressure    ED Visit Averted N/A    Life-Saving Intervention Made N/A         BP check only. No other needs today. Richerd Plana BSN RN CCNP [phone]

## 2024-04-10 ENCOUNTER — Other Ambulatory Visit: Payer: Self-pay

## 2024-04-10 DIAGNOSIS — F4322 Adjustment disorder with anxiety: Secondary | ICD-10-CM

## 2024-04-10 DIAGNOSIS — F22 Delusional disorders: Secondary | ICD-10-CM

## 2024-04-15 ENCOUNTER — Other Ambulatory Visit: Payer: Self-pay

## 2024-04-15 NOTE — Patient Instructions (Signed)
 Visit Information  Thank you for taking time to visit with me today. Please don't hesitate to contact me if I can be of assistance to you before our next scheduled appointment.  Your next care management appointment is by telephone on 04/24/24 at 2:30pm   Please call the care guide team at [phone] if you need to cancel, schedule, or reschedule an appointment.   Please call 911 if you are experiencing a Mental Health or Behavioral Health Crisis or need someone to talk to.  Tillman Gardener, BSW [place]  [facility], [facility] Social Worker Direct Dial: [phone]  Fax: [phone] Website: [email or web address]

## 2024-04-15 NOTE — Patient Outreach (Signed)
 SW to assist with enrolling with Tailored Plan.  SW t/c [employer] and discovered patient is currently assigned to [employer] for BH/MH but has [employer], which does not include a Futures Trader.  [employer] transfers to [employer] but states Medicaid has to make the change.  Line is disconnected in transfer to [employer].  SW tc Medicaid and after a long hold, staff reports a message will be given to Coletta Ned Medicaid Case Worker to follow up by 10/29.  Patient did not answer when on the phone with Medicaid and SW is unsure if the line disconnected on patients end. SW t/c patient and no answer.  Patient t/c SW and SW informed of conversation with DSS and to await a call from [facility] worker to assist with enrolling into the plan that provides [facility].  SW scheduled a follow up 04/24/24 at 2:30pm.   Tillman Gardener, BSW [place]  [facility], [facility] Social Worker Direct Dial: [phone]  Fax: [phone] Website: [email or web address]

## 2024-04-23 ENCOUNTER — Ambulatory Visit: Payer: Self-pay

## 2024-04-23 ENCOUNTER — Telehealth: Payer: Self-pay

## 2024-04-23 NOTE — Telephone Encounter (Signed)
 FYI Only or Action Required?: Action required by provider: clinical question for provider.  Patient was last seen in primary care on 02/27/2024 by Bevely Doffing, FNP.  Called Nurse Triage reporting Advice Only.  Symptoms began today.   Triage Disposition: Call PCP When Office is Open  Patient/caregiver understands and will follow disposition?: No, wishes to speak with PCP   Copied from CRM #[id number]. Topic: Clinical - Red Word Triage >> Apr 23, 2024 11:48 AM Tiffany Mendoza wrote: Red Word that prompted transfer to Nurse Triage: patient is experiencing depression. She also came in 02/27/24 and her medication was suppose to be decreased to 50mg . Patient received 100 mg at the pharmacy. She wants to know if she needs to take the 100mg ?   sertraline  (ZOLOFT ) 100 MG tablet Reason for Disposition  [1] Caller requesting NON-URGENT health information AND [2] PCP's office is the best resource    Routing to PCP office for review and follow up  Answer Assessment - Initial Assessment Questions 1. REASON FOR CALL: What is the main reason for your call? or How can I best help you?     Pt called trying to get clarity about sertraline  (zoloft ): stated last time in office provider decreased her medication from 100 mg to 50 mg daily.  Pt stated she went to pharmacy to pick up refill and they gave her another refill of 100 mg and pt stated either pharmacy did not know what they were doing or the provider that she saw did not know what she was doing - because the medication is incorrect.  Nurse attempted to explain to pt that the instructions state pt is to take 1/2 tab of medication daily to equal 0.5 mg: pt would not listen and began talking over nurse.  Nurse asked pt to read the label on the bottle in order to verify instructions to what was on screen and pt refused: pt stated to send her PCP a note and have her PCP nurse to call back regarding this medication: pt mentioned depression since decrease in medication:  does not want to be triaged or what to speak to anyone other nurse, provider or staff member except her PCP or PCP nurse.  Please call pt.  Protocols used: Information Only Call - No Triage-A-AH

## 2024-04-23 NOTE — Telephone Encounter (Signed)
 Copied from CRM [id number]. Topic: Clinical - Medication Question >> Apr 23, 2024 12:09 PM Avram MATSU wrote: Reason for CRM: patient is calling asking to speak with Tiffany Mendoza, she has a few questions about sertraline  (ZOLOFT ) 100 MG tablet [[id number]][phone].

## 2024-04-24 ENCOUNTER — Telehealth: Payer: Self-pay

## 2024-04-24 NOTE — Telephone Encounter (Signed)
 See other note, waiting for prescriber response

## 2024-04-25 MED ORDER — SERTRALINE HCL 50 MG PO TABS: ORAL_TABLET | ORAL | 1 refills | Status: DC

## 2024-04-25 NOTE — Telephone Encounter (Signed)
 New dose zoloft  50 mg 1.5 tabs daily is sent

## 2024-04-29 ENCOUNTER — Other Ambulatory Visit: Payer: Self-pay

## 2024-04-29 NOTE — Patient Outreach (Signed)
 Patient reports she does not want accepting [employer] to affect her [facility] services.  SW educated patient on [facility] Tailored Plan Care Management services.  SW t/c [employer] and spoke to staff who informed patient is not eligible for Tailored Plan.  Staff directed patient to contact DSS directly to determine if patient is eligible for Tailored Plan.  SW scheduled appointment 05/03/24 at 2pm to contact DSS to inquire about Tailored Plan services.  Tillman Gardener, BSW [place]  [facility], [facility] Social Worker Direct Dial: [phone]  Fax: [phone] Website: [email or web address]

## 2024-05-03 ENCOUNTER — Telehealth: Payer: Self-pay

## 2024-05-03 ENCOUNTER — Institutional Professional Consult (permissible substitution): Admitting: Professional Counselor

## 2024-05-03 NOTE — Patient Instructions (Signed)
 Tiffany Mendoza - I am sorry I was unable to reach you today for our scheduled appointment. I work with Antonetta Rollene BRAVO, MD and am calling to support your healthcare needs. Please contact me at [phone] at your earliest convenience. I look forward to speaking with you soon.   Thank you,  Tillman Gardener, BSW [place]  [facility], [facility] Social Worker Direct Dial: [phone]  Fax: [phone] Website: [email or web address]

## 2024-05-06 ENCOUNTER — Ambulatory Visit: Payer: Self-pay

## 2024-05-06 NOTE — Telephone Encounter (Signed)
 FYI Only or Action Required?: Action required by provider: update on patient condition. Patient would like for Abby, from the office, call to verify her appt for December 1 because she recognizes her voice  Patient was last seen in primary care on 02/27/2024 by Bevely Doffing, FNP.  Called Nurse Triage reporting Med Change Request.  Symptoms began several weeks ago.  Interventions attempted: Nothing.  Symptoms are: gradually worsening.  Triage Disposition: Call PCP When Office is Open  Patient/caregiver understands and will follow disposition?: No  Summary: rx concern   Reason for Triage: The patient would like to be contacted to discuss their prescription for sertraline  (ZOLOFT ) 50 MG tablet [[id number]] and a potential increase in their prescription. The patient is interested in seeking an increase but unable to come in to the office presently       Reason for Disposition  [1] Caller has NON-URGENT medicine question about med that PCP prescribed AND [2] triager unable to answer question  Answer Assessment - Initial Assessment Questions Scheduled to see Dr. Antonetta in December, would prefer to see her, and not a different provider Patient did state that she thinks that someone is intervening with her phone calls, someone who lives below her She states that someone called her last week to schedule an appointment for December 10. She also states that she knows the someone in the apartment below is watching her. She states that they are seeing in her bedroom and seeing her notes  1. NAME of MEDICINE: What medicine(s) are you calling about?     sertraline  2. QUESTION: What is your question? (e.g., double dose of medicine, side effect)     Patient would like to increased her dosage up to 75 mg again 3. PRESCRIBER: Who prescribed the medicine? Reason: if prescribed by specialist, call should be referred to that group.     Dr. Antonetta (original prescriber) and L. Huenink, FNP  (reduced dosage) 4. SYMPTOMS: Do you have any symptoms? If Yes, ask: What symptoms are you having?  How bad are the symptoms (e.g., mild, moderate, severe)     Increased symptoms of sadness  Protocols used: Medication Question Call-A-AH

## 2024-05-07 ENCOUNTER — Ambulatory Visit: Attending: Internal Medicine | Admitting: Internal Medicine

## 2024-05-07 VITALS — BP 118/80 | HR 76 | Ht 67.0 in | Wt 151.0 lb

## 2024-05-07 DIAGNOSIS — I2510 Atherosclerotic heart disease of native coronary artery without angina pectoris: Secondary | ICD-10-CM | POA: Diagnosis not present

## 2024-05-07 DIAGNOSIS — E782 Mixed hyperlipidemia: Secondary | ICD-10-CM

## 2024-05-07 DIAGNOSIS — I498 Other specified cardiac arrhythmias: Secondary | ICD-10-CM | POA: Diagnosis not present

## 2024-05-07 DIAGNOSIS — I2583 Coronary atherosclerosis due to lipid rich plaque: Secondary | ICD-10-CM

## 2024-05-07 NOTE — Progress Notes (Signed)
 Cardiology Office Note:    Date:  05/07/2024   ID:  Tiffany Mendoza, DOB [birth year], MRN [id number]  PCP:  Tiffany Rollene BRAVO, Mendoza   [facility] Providers Cardiologist:  Stanly DELENA Tiffany Mendoza     Referring Mendoza: Tiffany Rollene BRAVO, Mendoza   CC: CAD f/u  History of Present Illness:    Tiffany Mendoza is a 73 y.o. female with a hx of HTN, who presents for evaluation 09/29/21. 2023: In March she was going through a lot of stress: her mother has dementia and needs additional care. Found to have mild CAD.  Mother passed 11/2022. 2024: Moving out of her mother's house.    She has a history of hypertension, nonobstructive coronary artery disease, and frequent premature ventricular contractions (PVCs). She is currently managed on potassium and hydrochlorothiazide . No chest pain is reported, but she experiences irregular heartbeats, which she associates with stress and caregiving responsibilities.  She recalls an abnormal EKG on September 12, 2021, which led to a CT scan. Her LDL cholesterol was at goal last year, and she has been proactive in managing her cholesterol through diet, including consuming oatmeal regularly. She recalls her cholesterol levels being high in April 2023, but she has since improved them significantly.  She is currently experiencing a stressful period, which she believes may be impacting her health. She has difficulty with transportation and relies on friends for assistance. She expresses concern about potential electronic surveillance in her living space, which adds to her stress (Cannot prove this; she notes the the irregular hearing in her ears has now resolved).   Past Medical History:  Diagnosis Date   Adjustment disorder with mixed anxiety and depressed mood    Allergy    Carotid bruit    Depression    Dermatophytosis of foot    GERD (gastroesophageal reflux disease)    Hypertension    Insomnia    Neck pain    Osteoporosis     Past Surgical History:   Procedure Laterality Date   ABDOMINAL HYSTERECTOMY  1988   HEMORRHOID SURGERY     myoectomy  1984    Current Medications: Current Meds  Medication Sig   clorazepate  (TRANXENE -T) 7.5 MG tablet Take one tablet at bedtime , as needed, for  uncontrolled anxiety , and poor sleep. Maximu is 4 times per week   hydrochlorothiazide  (HYDRODIURIL ) 25 MG tablet Take 1 tablet by mouth once daily   polyethylene glycol powder (GLYCOLAX/MIRALAX) 17 GM/SCOOP powder Take 17 g by mouth daily. Dissolve 1 capful (17g) in 4-8 ounces of liquid and take by mouth daily.   Potassium 99 MG TABS Take 99 mg by mouth 2 times daily at 12 noon and 4 pm.   sertraline  (ZOLOFT ) 50 MG tablet Take one and a half tablets by mouth once daily   vitamin B-12 (CYANOCOBALAMIN) 100 MCG tablet Take 100 mcg by mouth daily.   Wheat Dextrin (BENEFIBER PO) Take 1 Dose by mouth daily.     Allergies:   Patient has no known allergies.   Social History   Socioeconomic History   Marital status: Divorced    Spouse name: Not on file   Number of children: 0   Years of education: college   Highest education level: Bachelor's degree (e.g., BA, AB, BS)  Occupational History   Occupation: caregiver  Tobacco Use   Smoking status: Former    Types: Cigarettes   Smokeless tobacco: Never  Vaping Use   Vaping status: Never Used  Substance  and Sexual Activity   Alcohol use: Not Currently    Comment: occasional wine   Drug use: No   Sexual activity: Not Currently  Other Topics Concern   Not on file  Social History Narrative   Lives alone, divorced   Was the caregiver for her Mother prior to her passing in June 2023 who had dementia    2024 continues to live in her mother's home   Social Drivers of Health   Financial Resource Strain: Medium Risk (06/18/2023)   Overall Financial Resource Strain (CARDIA)    Difficulty of Paying Living Expenses: Somewhat hard  Food Insecurity: No Food Insecurity (02/02/2024)   Hunger Vital Sign     Worried About Running Out of Food in the Last Year: Never true    Ran Out of Food in the Last Year: Never true  Transportation Needs: No Transportation Needs (02/02/2024)   PRAPARE - Administrator, Civil Service (Medical): No    Lack of Transportation (Non-Medical): No  Physical Activity: Inactive (06/18/2023)   Exercise Vital Sign    Days of Exercise per Week: 0 days    Minutes of Exercise per Session: 0 min  Stress: Stress Concern Present (06/18/2023)   [employer] of Occupational Health - Occupational Stress Questionnaire    Feeling of Stress : To some extent  Social Connections: Unknown (06/18/2023)   Social Connection and Isolation Panel    Frequency of Communication with Friends and Family: Three times a week    Frequency of Social Gatherings with Friends and Family: Patient declined    Attends Religious Services: Patient declined    Database Administrator or Organizations: No    Attends Engineer, Structural: Never    Marital Status: Divorced    Social: from [place], mother passed in 2023 from dementia  Family History: The patient's family history includes ALS in her father; Colon polyps in her father; Fibroids in her sister; Hypertension in her sister; Liver cancer in her paternal grandmother; Memory loss in her mother; Pancreatic cancer in her maternal grandfather; Prostate cancer in her father; Thyroid  disease in her sister; Thyroid  nodules in her sister. There is no history of Stomach cancer, Colon cancer, or Esophageal cancer. No heart disease in family  ROS:   Please see the history of present illness.     EKGs/Labs/Other Studies Reviewed:    The following studies were reviewed today:  EKG:   09/29/21: SR rate 70 septal infarct pattern no ST changes  Cardiac Studies & Procedures   ______________________________________________________________________________________________          CT SCANS  CT CORONARY MORPH W/CTA COR  W/SCORE 10/11/2021  Addendum 10/11/2021  3:39 PM ADDENDUM REPORT: 10/11/2021 15:37  CLINICAL DATA:  72F with chest pain  EXAM: Cardiac/Coronary CTA  TECHNIQUE: The patient was scanned on a [employer].  FINDINGS: A 100 kV prospective scan was triggered in the descending thoracic aorta at 111 HU's. Axial non-contrast 3 mm slices were carried out through the heart. The data set was analyzed on a dedicated work station and scored using the Agatson method. Gantry rotation speed was 250 msecs and collimation was .6 mm. 0.8 mg of sl NTG was given. The 3D data set was reconstructed in 5% intervals of the 35-75 % of the R-R cycle. Phases were analyzed on a dedicated work station using MPR, MIP and VRT modes. The patient received 80 cc of contrast.  Coronary Arteries:  Normal coronary origin.  Right  dominance.  RCA is a large dominant artery that gives rise to PDA and PLA. There is no plaque.  Left main is a large artery that gives rise to LAD and LCX arteries.  LAD is a large vessel. Calcified plaque in proximal LAD causes 0-24% stenosis. Calcified plaque in mid LAD causes 0-24% stenosis  LCX is a non-dominant artery.  There is no plaque.  Other findings:  Left Ventricle: Normal size  Left Atrium: Mild enlargement  Pulmonary Veins: Normal configuration  Right Ventricle: Normal size  Right Atrium: Normal size  Cardiac valves: No calcifications  Thoracic aorta: Normal size  Pulmonary Arteries: Normal size  Systemic Veins: Normal drainage  Pericardium: Normal thickness  IMPRESSION: 1. Coronary calcium  score of 79. This was 70th percentile for age and sex matched control.  2.  Normal coronary origin with right dominance.  3. Nonobstructive CAD with calcified plaque in proximal and mid LAD causing minimal (0-24%) stenosis  CAD-RADS 1. Minimal non-obstructive CAD (0-24%). Consider non-atherosclerotic causes of chest pain. Consider preventive therapy and  risk factor modification.   Electronically Signed By: Lonni Nanas M.D. On: 10/11/2021 15:37  Narrative EXAM: OVER-READ INTERPRETATION  CT CHEST  The following report is an over-read performed by radiologist Dr. Rockey Kilts of [facility] on 10/11/2021. This over-read does not include interpretation of cardiac or coronary anatomy or pathology. The coronary CTA interpretation by the cardiologist is attached.  COMPARISON:  09/12/2021 chest radiograph.  FINDINGS: Vascular: Normal aortic caliber. Tortuous thoracic aorta. Aortic atherosclerosis. No central pulmonary embolism, on this non-dedicated study.  Mediastinum/Nodes: No imaged thoracic adenopathy.  Lungs/Pleura: No pleural fluid.  Clear imaged lungs.  Upper Abdomen: Normal imaged portions of the liver, spleen, stomach.  Musculoskeletal: Lower thoracic spondylosis.  IMPRESSION: No acute findings in the imaged extracardiac chest.  Aortic Atherosclerosis (ICD10-I70.0).  Electronically Signed: By: Rockey Kilts M.D. On: 10/11/2021 14:27     ______________________________________________________________________________________________      Recent Labs: 08/20/2023: TSH 2.169 10/08/2023: ALT 27; Hemoglobin 13.2; Magnesium 2.2; Platelets 223 02/27/2024: BUN 9; Creatinine, Ser 0.76; Potassium 3.5; Sodium 141  Recent Lipid Panel    Component Value Date/Time   CHOL 142 05/16/2023 1240   TRIG 48 05/16/2023 1240   HDL 74 05/16/2023 1240   CHOLHDL 1.9 05/16/2023 1240   CHOLHDL 3.3 04/15/2020 1404   VLDL 15 06/01/2016 1619   LDLCALC 57 05/16/2023 1240   LDLCALC 103 (H) 04/15/2020 1404    Physical Exam:    VS:  BP 118/80   Pulse 76   Ht 5' 7 (1.702 m)   Wt 151 lb (68.5 kg)   SpO2 95%   BMI 23.65 kg/m     Wt Readings from Last 3 Encounters:  05/07/24 151 lb (68.5 kg)  03/07/24 155 lb (70.3 kg)  02/27/24 154 lb (69.9 kg)    Gen: No distress   Neck: No JVD Cardiac: No Rubs or Gallops, no  murmur, RRR +2 radial pulses Respiratory: Clear to auscultation bilaterally, normal effort, normal  respiratory rate GI: Soft, nontender, non-distended normal MS: No  edema;  moves all extremities Integument: Skin feels warm Neuro:  At time of evaluation, alert and oriented to person/place/time/situation   ASSESSMENT/PLAN:    Nonobstructive coronary artery disease Minimal nonobstructive coronary artery disease identified on previous CT scan. EKG changes post-COVID were significant but not severe enough to warrant invasive procedures. Stress and caregiving responsibilities may have contributed to symptoms. Current management focuses on prevention. - Continue current management and prevention  strategies.  Premature ventricular contractions (PVCs) PVCs in a bigeminy pattern noted on last EKG. Potassium levels are managed by primary care physician. No acute intervention required at this time. - Checked potassium levels as requested by primary care physician. - I am curious if the irregular beats she was hearing were not a complex surveillance system looking to surveil her and rather may have been PVCS that with Dr. Ruther tx of hypokalemia, has resolved.  Will let Mr. Reida know as well.  Mixed hyperlipidemia Cholesterol levels were at goal last year. Current status to be assessed with lab tests. - Ordered cholesterol panel to assess current lipid levels.       Stanly Tiffany Mendoza FASE [facility] Cardiologist [facility]  [address] [place], [place] [address] [phone]  5:40 PM

## 2024-05-07 NOTE — Telephone Encounter (Signed)
 I took care of this.

## 2024-05-07 NOTE — Patient Instructions (Addendum)
 Medication Instructions:  Your physician recommends that you continue on your current medications as directed. Please refer to the Current Medication list given to you today.  *If you need a refill on your cardiac medications before your next appointment, please call your pharmacy*  Lab Work: LDL direct, BMP If you have labs (blood work) drawn today and your tests are completely normal, you will receive your results only by: MyChart Message (if you have MyChart) OR A paper copy in the mail If you have any lab test that is abnormal or we need to change your treatment, we will call you to review the results.   Follow-Up: At [facility], you and your health needs are our priority.  As part of our continuing mission to provide you with exceptional heart care, our providers are all part of one team.  This team includes your primary Cardiologist (physician) and Advanced Practice Providers or APPs (Physician Assistants and Nurse Practitioners) who all work together to provide you with the care you need, when you need it.  Your next appointment:   1 year(s)  Provider:   Stanly DELENA Leavens, MD or APP

## 2024-05-08 LAB — BASIC METABOLIC PANEL WITH GFR
BUN/Creatinine Ratio: 16 (ref 12–28)
BUN: 14 mg/dL (ref 8–27)
CO2: 27 mmol/L (ref 20–29)
Calcium: 9.8 mg/dL (ref 8.7–10.3)
Chloride: 100 mmol/L (ref 96–106)
Creatinine, Ser: 0.85 mg/dL (ref 0.57–1.00)
Glucose: 71 mg/dL (ref 70–99)
Potassium: 3.6 mmol/L (ref 3.5–5.2)
Sodium: 140 mmol/L (ref 134–144)
eGFR: 71 mL/min/1.73 (ref >59)

## 2024-05-08 LAB — LDL CHOLESTEROL, DIRECT: LDL Direct: 99 mg/dL (ref 0–99)

## 2024-05-13 ENCOUNTER — Telehealth: Payer: Self-pay | Admitting: Internal Medicine

## 2024-05-13 NOTE — Telephone Encounter (Signed)
 Patient would like a call back to review labs when they are available.

## 2024-05-15 ENCOUNTER — Ambulatory Visit: Payer: Self-pay | Admitting: *Deleted

## 2024-05-15 DIAGNOSIS — E782 Mixed hyperlipidemia: Secondary | ICD-10-CM

## 2024-05-15 MED ORDER — ROSUVASTATIN CALCIUM 5 MG PO TABS: 5.0000 mg | ORAL_TABLET | Freq: Every day | ORAL | 3 refills | Status: AC

## 2024-05-20 ENCOUNTER — Encounter: Payer: Self-pay | Admitting: Family Medicine

## 2024-05-20 ENCOUNTER — Ambulatory Visit: Admitting: Family Medicine

## 2024-05-20 VITALS — BP 129/78 | HR 86 | Resp 16 | Ht 67.0 in | Wt 154.1 lb

## 2024-05-20 DIAGNOSIS — M2041 Other hammer toe(s) (acquired), right foot: Secondary | ICD-10-CM

## 2024-05-20 DIAGNOSIS — L84 Corns and callosities: Secondary | ICD-10-CM

## 2024-05-20 DIAGNOSIS — Z23 Encounter for immunization: Secondary | ICD-10-CM

## 2024-05-20 DIAGNOSIS — H6123 Impacted cerumen, bilateral: Secondary | ICD-10-CM

## 2024-05-20 DIAGNOSIS — I10 Essential (primary) hypertension: Secondary | ICD-10-CM | POA: Diagnosis not present

## 2024-05-20 MED ORDER — SERTRALINE HCL 50 MG PO TABS: ORAL_TABLET | ORAL | 1 refills | Status: DC

## 2024-05-20 NOTE — Patient Instructions (Addendum)
 F/U in 3 months  Flu vaccine today  Covid  vaccine  next week at pharmacy  Reduce as of today max of 2 chlorazepate per day as of today  Increase zoloft  to 50 mg one and a half daily, nurse pls print the scriopt written in Nov 2025 and give pt  You are referred to ENT and Podiatry  You may use melatonin if needed on days when you do sot take to chlorazepate  Thanks for choosing [facility], we consider it a privelige to serve you.

## 2024-05-22 NOTE — Telephone Encounter (Signed)
 Left message for pt to call.

## 2024-05-23 ENCOUNTER — Encounter (INDEPENDENT_AMBULATORY_CARE_PROVIDER_SITE_OTHER): Payer: Self-pay

## 2024-05-23 NOTE — Telephone Encounter (Signed)
 Pt calling for a update about her mailed results.

## 2024-05-23 NOTE — Telephone Encounter (Signed)
Returned call to patient left message on personal voice mail to call back. 

## 2024-05-29 ENCOUNTER — Ambulatory Visit: Admitting: Family Medicine

## 2024-05-30 ENCOUNTER — Inpatient Hospital Stay: Payer: Medicare Other

## 2024-05-30 ENCOUNTER — Encounter: Payer: Self-pay | Admitting: Family Medicine

## 2024-05-30 DIAGNOSIS — Z23 Encounter for immunization: Secondary | ICD-10-CM | POA: Insufficient documentation

## 2024-05-30 DIAGNOSIS — L84 Corns and callosities: Secondary | ICD-10-CM | POA: Insufficient documentation

## 2024-05-30 DIAGNOSIS — M2041 Other hammer toe(s) (acquired), right foot: Secondary | ICD-10-CM | POA: Insufficient documentation

## 2024-05-30 DIAGNOSIS — H6123 Impacted cerumen, bilateral: Secondary | ICD-10-CM | POA: Insufficient documentation

## 2024-05-30 NOTE — Assessment & Plan Note (Signed)
 Podiatry to evaluate and manage

## 2024-05-30 NOTE — Assessment & Plan Note (Signed)
 Controlled, no change in medication DASH diet and commitment to daily physical activity for a minimum of 30 minutes discussed and encouraged, as a part of hypertension management. The importance of attaining a healthy weight is also discussed.     05/20/2024    3:40 PM 05/07/2024    3:20 PM 04/03/2024    3:20 PM 03/08/2024    2:53 PM 03/07/2024    2:24 PM 02/27/2024    2:55 PM 02/21/2024    3:45 PM  BP/Weight  Systolic BP 129 118 122  110 131 110  Diastolic BP 78 80 64  60 81 70  Wt. (Lbs) 154.06 151   155 154   BMI 24.13 kg/m2 23.65 kg/m2   25.21 kg/m2 24.12 kg/m2      Information is confidential and restricted. Go to Review Flowsheets to unlock data.

## 2024-05-30 NOTE — Assessment & Plan Note (Signed)
ENT to evaluate and manage

## 2024-05-30 NOTE — Assessment & Plan Note (Signed)
 Podiatry to eval and manage

## 2024-05-30 NOTE — Assessment & Plan Note (Signed)
 After obtaining informed consent, the influenza vaccine is  administered , with no adverse effect noted at the time of administration.

## 2024-05-30 NOTE — Progress Notes (Signed)
° °  Tiffany Mendoza     MRN: [id number]      DOB: [birth year]  Chief Complaint  Patient presents with   Hypertension    3 month follow up     HPI Tiffany Mendoza is here for follow up and re-evaluation of chronic medical conditions, medication management and review of any available recent lab and radiology data.  Preventive health is updated, specifically  Cancer screening and Immunization.   Questions or concerns regarding consultations or procedures which the PT has had in the interim are  addressed. The PT denies any adverse reactions to current medications since the last visit.  C/o reduced hearing with wax build up wants ENT eval C/o bilateral foot pain, callus and hammer toes , requests podiatry eval   ROS Denies recent fever or chills. Denies sinus pressure, nasal congestion, ear pain or sore throat. Denies chest congestion, productive cough or wheezing. Denies chest pains, palpitations and leg swelling Denies abdominal pain, nausea, vomiting,diarrhea or constipation.   Denies dysuria, frequency, hesitancy or incontinence. Denies headaches, seizures, numbness, or tingling. C/o increased depression and anxiety, not comfortable in current home  Denies skin break down or rash.   PE  BP 129/78   Pulse 86   Resp 16   Ht 5' 7 (1.702 m)   Wt 154 lb 1 oz (69.9 kg)   SpO2 98%   BMI 24.13 kg/m   Patient alert and oriented and in no cardiopulmonary distress.  HEENT: No facial asymmetry, EOMI,     Neck supple .  Chest: Clear to auscultation bilaterally.  CVS: S1, S2 no murmurs, no S3.Regular rate.  ABD: Soft non tender.   Ext: No edema  MS: Adequate ROM spine, shoulders, hips and knees.  Skin: Intact, no ulcerations or rash noted.  Psych: Good eye contact, normal affect. Memory intact not anxious or depressed appearing.  CNS: CN 2-12 intact, power,  normal throughout.no focal deficits noted.   Assessment & Plan  Callus Podiatry to evaluate and manage  Bilateral  impacted cerumen ENT to evaluate and manage  Depression, major, single episode, moderate (HCC) Uncontrolled , not suicidal or homicidal, increase zoloft  to 75 mg daily, re eval in 8 to 10 weeks  Essential hypertension Controlled, no change in medication DASH diet and commitment to daily physical activity for a minimum of 30 minutes discussed and encouraged, as a part of hypertension management. The importance of attaining a healthy weight is also discussed.     05/20/2024    3:40 PM 05/07/2024    3:20 PM 04/03/2024    3:20 PM 03/08/2024    2:53 PM 03/07/2024    2:24 PM 02/27/2024    2:55 PM 02/21/2024    3:45 PM  BP/Weight  Systolic BP 129 118 122  110 131 110  Diastolic BP 78 80 64  60 81 70  Wt. (Lbs) 154.06 151   155 154   BMI 24.13 kg/m2 23.65 kg/m2   25.21 kg/m2 24.12 kg/m2      Information is confidential and restricted. Go to Review Flowsheets to unlock data.       Hammer toes of both feet Podiatry to eval and manage  Immunization due After obtaining informed consent, the  influenza vaccine is  administered , with no adverse effect noted at the time of administration.

## 2024-05-30 NOTE — Assessment & Plan Note (Signed)
 Uncontrolled , not suicidal or homicidal, increase zoloft  to 75 mg daily, re eval in 8 to 10 weeks

## 2024-06-03 NOTE — Telephone Encounter (Signed)
 Called pt to f/u no answer phone continued to ring with no voicemail pick up.  Per 05/15/24 note from RN pt was made aware of results and copy mailed out.   Will close this encounter.

## 2024-06-04 ENCOUNTER — Telehealth: Payer: Self-pay | Admitting: Family Medicine

## 2024-06-04 DIAGNOSIS — R7689 Other specified abnormal immunological findings in serum: Secondary | ICD-10-CM

## 2024-06-04 NOTE — Addendum Note (Signed)
 Addended by: ANTONETTA QUANT E on: 06/04/2024 10:08 AM   Modules accepted: Orders

## 2024-06-04 NOTE — Telephone Encounter (Signed)
 Referred to heme/onc in [place] pls  let her know

## 2024-06-04 NOTE — Telephone Encounter (Signed)
 Copied from CRM #[id number]. Topic: Referral - Request for Referral >> Jun 03, 2024  5:47 PM Kevelyn M wrote: Reason for CRM: Patient would like a referral sent to [facility] long [facility]) in [place]. She can no longer drive to [place] location. She normally goes to [facility].  Preference of office, provider, location: Darryle Law

## 2024-06-04 NOTE — Telephone Encounter (Signed)
 N/a, unable to lvm

## 2024-06-04 NOTE — Telephone Encounter (Signed)
 Referral sent to: [facility] [address] [place], [place] [address] [phone]  Trudell Patient requested :)  MyChart Message sent to Patient with Specialty Office contact information.

## 2024-06-06 ENCOUNTER — Inpatient Hospital Stay: Payer: Medicare Other | Admitting: Oncology

## 2024-06-17 ENCOUNTER — Encounter: Payer: Self-pay | Admitting: *Deleted

## 2024-06-21 ENCOUNTER — Ambulatory Visit: Payer: Self-pay

## 2024-06-21 NOTE — Telephone Encounter (Signed)
 Pt hung up while transferring to nurse triage

## 2024-06-21 NOTE — Telephone Encounter (Signed)
 FYI Only or Action Required?: Action required by provider: update on patient condition.  Patient's sister called in tearful, expressing concern for patient having hallucinations/paranoia last 6 months since moving into new apartment.   Please see triage notes for further explanation.  Sister who is emergency contact asked that patient not be aware of her phone call as patient has been distrustful of her and paranoid toward family.  Afraid this will make things worse.   Triage RN was able to speak with patient after calling to do a check-in on patient status and tolerance of Sertraline . Reports doing well with recent medication dose change.  Denies any hallucinations or suicidal ideation, relates hearing noises due to thin walls and recent scammer phone calls.    Patient does report recent, recurrent water leaks in new apartment.  Wall was not opened to dry area out after pipe repair.  Water went down wall into neighboring complex.  High risk for potential toxic mold exposure and hidden mold growth in wall.   Sister would like provider to reach out/ address but do so with ease and not mention sister.      Patient was last seen in primary care on 05/20/2024 by Antonetta Rollene BRAVO, MD.  Called Nurse Triage reporting Paranoid.  Symptoms began several months ago.  Interventions attempted: Rest, hydration, or home remedies.  Symptoms are: gradually improving.  Triage Disposition: See PCP Within 2 Weeks-- advised patient to await response from provider possibly on Monday.       Patient/caregiver understands and will follow disposition?: Yes   Copied from CRM [id number]. Topic: Clinical - Red Word Triage >> Jun 21, 2024  3:45 PM Ahlexyia S wrote: Red Word that prompted transfer to Nurse Triage: Pt sister Crystal called in stating that she is concerned about pt. Crystal stated that pt has been hearing voices lately and that if family members mention their concerns to the pt, the pt becomes upset  and feels as if family members are in on doing something to her. Crystal also mentioned that pt feels as if someone is invading her privacy at her home and that she is being watched and listened to. Pt thinks someone tapped into her phone and using her family members voices to speak to her. Crystal stated that when she was last there pt was expressing extreme paranoia and that someone would possibly break in and take her clothes. Crystal is highly concerned about pt behavior and she isnt sure what to do or how to help pt. Crystal stated pt isnt being evaluated for anything but sees a veterinary surgeon (unsure of who pt sees). Crystal doesn't really want pt to be informed of these concerns due to previous encounters of expressing concerns to pt. Warm transferred to nurse triage. Reason for Disposition  Hallucinations are a chronic symptom (recurrent or ongoing AND present > 4 weeks)    Ongoing per sister.  Uncertain if true hallucinations versus noise in apartment per patient.  Will request office to advise appt. Date.  Additional Information  Negative: [1] Hallucinations AND [2] NEW-ONSET    Not getting worse but also not yet diagnosed per sister.  Unclear if provider aware of situation.  Patient denies any hallucinations.  Answer Assessment - Initial Assessment Questions Initial Assessment Responses per sister who has called in expressing concerns.  See later Assessment notes based on patient response.   1. MAIN CONCERN: What happened that made you call today?     Increasing Paranoia over time, last 6  months. It is unclear based on review of recent office visit note if patient volunteered symptom of hallucinations to provider or if these are true hallucinations.   2. DESCRIPTION: What are the hallucinations like? Describe them for me. (e.g., auditory, visual, tactile; worsening; threatening) If auditory, ask Are they telling you to hurt yourself or someone else?     Auditory- hearing voices.   3.  ONSET: When did this start? (e.g., hours, days, months)     Moved to apartment in March, 2025, symptoms started in summer, 2025.  4. PATTERN: Do they come and go, or are they present all the time?  Are they present now?     On/off, family reports Yuleni is still there but is different.   5. PRIOR HALLUCINATIONS: Have you had hallucinations in the past? (e.g., same, different, worse)     No known, started in last 6 months.  No diagnosis listed or known such as schizophrenia.   6. MENTAL HEALTH HISTORY: Have you ever been diagnosed with schizophrenia or any other mental health problem? (e.g., bipolar disorder, schizophrenia; dementia, Parkinsons)     Family history of dementia (mother) and ALS (father). Depression/ Anxiety for patient, recent medication increase.   7. MENTAL HEALTH MEDICINES: Are you taking any medicines for depression or other mental health problems? (e.g., psychiatric medicines; sleeping pills)     Anxiety/depression meds.   8. ALCOHOL or DRUG ABUSE: Have you been drinking alcohol or taking any drugs? Have you recently stopped drinking alcohol or using drugs?     No known drug or alcohol use.  One glass of wine on Christmas per sister.   9. MEDICINE CHANGES: Did you recently stop taking a medicine? (e.g., antidepressant, barbiturates, benzodiazepine, gabapentin)  Did you recently start a new medicine or increase the dose? (e.g., antidepressant, digoxin, sleep medicine, steroid, Parkinson's medicine)     See 12/1 note for medication change (anxiety/depression).   10. SUPPORT: Is there anyone else with you?       Lives alone, sister checks in.   Other family has expressed concerns to patient's sister, question if patient is okay to live alone.   11. STRESS: Are you experiencing any particular stressors?       Recently lost mother in June 2023, was caregiver for her.   Has always been pretty sharp. Was there when mother passed, moved out of that  home in March 2025.   12. OTHER SYMPTOMS: Are there any other symptoms? (e.g., difficulty breathing, headache, fever, weakness)       Auditory hallucinations other family can't hear.  Possibly hearing neighbor downstairs but sister thought that apartment was empty.    Has had a past history of a little bit of worry/ paranoia in certain situations, not continuous like this.  Is now worried about privacy/security where moved to in new apartment.   Lived with mother for 17 years.   Mother had dementia, mother had small signs for about 10 years then progressed to different stages. 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  Mother passed 3 years ago.   Moved into new apartment March, 2025.  Sister describes as a fresh start and sister becomes tearful during call expressing concern for her sister after mother having dementia and everything she has been through.  Sister reports patient is now becoming paranoid towards family, has always had some family stress and rivalry involvement but this is new/worse. Started having paranoia in Summer time.  Worried about neighbors and  cameras tapping into phone, email, installing cameras.  Wrote letter to apartment.  Has been opening cabinets, closet doors, hears someone talking.    2 weeks ago sister saw in person, visited apartment. Front area cluttered after took things out of storage.  Bedroom and bathroom remain clean.  Family home where mother died did not have fan in bathroom and humidity levels were not monitored.   Patient's new apartment also recently had a water leak in the kitchen repaired a few weeks ago.  Possible exposure to mold but uncertain.      Called CAL @ 1652 and 1653- No Response.    Patient called back and this Triage RN was able to reach at 1702 ET.    Phone Call with Patient @1702 :  Doing okay with medication dose, Sertraline .   Seems to be okay.    Has specialty appts scheduled.  Monitoring cholesterol- taking statin.    No known side effects with Sertraline  dose change. History of Chronic Fatigue.  References immunes sytem.   Didn't like 100 mg dose, didn't feel good.  75 mg dose seems to be okay.  50 mg doesn't seem to work like it did before.  Much better in apartment, can determine normal symptoms now. Feeling much better than when moved in March, 2025.  Is taking it slow, changing priorities.  Eating routinely.   Very intuitive with body.  Car not working, nephew helping to get to store.  Feels rest has been very helpful.   States immune system was impacted by stress, poor appetite, fatigue.   Wanted to move out of apartment, finds reassurance in prayer and won't be there forever.  Walls are thin, doesn't care for steps at current age.  Does not want to be there long term.   Never had any issues with Sertraline .  Scammer phone calls became aggravating recently.  Denies any hallucinations.  Has been aggravated with privacy issues, having nightmares.  Recently had nightmares relates to stress.    Denies suicidal ideation.  Fatigue only with lack of rest.    Able to speak with friends, uncertain of family interactions recently.  Sister surprised her recently checking in.  Conflicts on occasion and still deciding how to handle recent issues with family.    Can sometimes hear through walls (thin walls in apartment).  Called downtown to inquire about this and was told complex does have thin walls.    When moved in, seemed to be an issue with water leaks.  Had water leak under sink fixed two times. Found pipe broke and then fixed that.  Water was running down apartment wall under patient's unit, her kitchen wall down to neighbor's kitchen wall.  Did not open wall, was a recurrent leak. Has seen no visible mold growth but uncertain of what's in wall.    Attempted to call sister back with updates, no answer @ 1744.  Protocols used: Hallucinations-A-AH

## 2024-06-25 ENCOUNTER — Other Ambulatory Visit: Payer: Self-pay

## 2024-06-25 DIAGNOSIS — F22 Delusional disorders: Secondary | ICD-10-CM

## 2024-06-25 DIAGNOSIS — F4322 Adjustment disorder with anxiety: Secondary | ICD-10-CM

## 2024-06-25 NOTE — Telephone Encounter (Signed)
 Noted, thank you.

## 2024-06-26 ENCOUNTER — Ambulatory Visit: Admitting: Podiatry

## 2024-07-05 ENCOUNTER — Inpatient Hospital Stay

## 2024-07-05 ENCOUNTER — Inpatient Hospital Stay: Attending: Hematology and Oncology | Admitting: Hematology and Oncology

## 2024-07-05 VITALS — BP 149/77 | HR 88 | Temp 98.3°F | Resp 16 | Wt 156.1 lb

## 2024-07-05 DIAGNOSIS — R7689 Other specified abnormal immunological findings in serum: Secondary | ICD-10-CM | POA: Diagnosis not present

## 2024-07-05 LAB — CBC WITH DIFFERENTIAL (CANCER CENTER ONLY)
Abs Immature Granulocytes: 0.02 K/uL (ref 0.00–0.07)
Basophils Absolute: 0.1 K/uL (ref 0.0–0.1)
Basophils Relative: 1 %
Eosinophils Absolute: 0.2 K/uL (ref 0.0–0.5)
Eosinophils Relative: 2 %
HCT: 39.4 % (ref 36.0–46.0)
Hemoglobin: 13.4 g/dL (ref 12.0–15.0)
Immature Granulocytes: 0 %
Lymphocytes Relative: 26 %
Lymphs Abs: 1.9 K/uL (ref 0.7–4.0)
MCH: 32.1 pg (ref 26.0–34.0)
MCHC: 34 g/dL (ref 30.0–36.0)
MCV: 94.3 fL (ref 80.0–100.0)
Monocytes Absolute: 0.6 K/uL (ref 0.1–1.0)
Monocytes Relative: 7 %
Neutro Abs: 4.8 K/uL (ref 1.7–7.7)
Neutrophils Relative %: 64 %
Platelet Count: 215 K/uL (ref 150–400)
RBC: 4.18 MIL/uL (ref 3.87–5.11)
RDW: 12 % (ref 11.5–15.5)
WBC Count: 7.6 K/uL (ref 4.0–10.5)
nRBC: 0 % (ref 0.0–0.2)

## 2024-07-05 LAB — CMP (CANCER CENTER ONLY)
ALT: 51 U/L — ABNORMAL HIGH (ref 0–44)
AST: 32 U/L (ref 15–41)
Albumin: 4.8 g/dL (ref 3.5–5.0)
Alkaline Phosphatase: 55 U/L (ref 38–126)
Anion gap: 9 (ref 5–15)
BUN: 10 mg/dL (ref 8–23)
CO2: 30 mmol/L (ref 22–32)
Calcium: 10 mg/dL (ref 8.9–10.3)
Chloride: 102 mmol/L (ref 98–111)
Creatinine: 0.82 mg/dL (ref 0.44–1.00)
GFR, Estimated: >60 mL/min
Glucose, Bld: 74 mg/dL (ref 70–99)
Potassium: 3.8 mmol/L (ref 3.5–5.1)
Sodium: 141 mmol/L (ref 135–145)
Total Bilirubin: 0.5 mg/dL (ref 0.0–1.2)
Total Protein: 8 g/dL (ref 6.5–8.1)

## 2024-07-05 LAB — LACTATE DEHYDROGENASE: LDH: 150 U/L (ref 105–235)

## 2024-07-05 NOTE — Progress Notes (Unsigned)
 " [facility] [phone]   [phone]  INITIAL CONSULT NOTE  Patient Care Team: Antonetta Rollene BRAVO, MD as PCP - General (Family Medicine) Santo Stanly LABOR, MD as PCP - Cardiology (Cardiology) Roz Anes, MD as Consulting Physician (Ophthalmology) Reida Redell PARAS as Counselor (Professional Counselor) Shila Gustav GAILS, MD as Consulting Physician (Gastroenterology) Darlean Ozell NOVAK, MD as Consulting Physician (Pulmonary Disease) Bertrum Rosina HERO, RN as [facility] Care Management  Hematological/Oncological History # Elevated Serum Free Light Chain Ratio 06/01/2023: last visit with Dr. Katragadda at [facility] 07/05/2024: transfer care to Dr. Federico   CHIEF COMPLAINTS/PURPOSE OF CONSULTATION:  Follow up for Elevated Serum Free Light Chain Ratio   HISTORY OF PRESENTING ILLNESS:  Tiffany Mendoza 74 y.o. female with medical history significant for an elevated serum free light chain ratio, previously followed by Dr. Rogers.   On review of the previous records Tiffany Mendoza was last seen by Dr. MARLA on 06/01/2023.  She had a history of elevated kappa light chains with an elevated light chain ratio.  She now went bone marrow biopsy on 10/06/2015 which showed a polyclonal plasmacytosis at 6%.  She underwent skeletal surveys with no clear evidence of lytic lesions.  Last survey was on 05/19/2022.  Given that Dr. MARLA has left the practice that she presents today to establish care.  On exam today Tiffany Mendoza that she is transferring care back to [place] because she was previously living in [place] taking care of her mother but now lives in [place] again.  She reports that she has a good understanding of her history of the elevated serum free light chains.  She provided a good description of her history today.  She notes that she has had no recent changes in her health since moving back to [place].  She denies any fevers, chills, sweats, nausea,  vomiting or diarrhea.  She is not having any bone or back pain.  Her largest complaints are stress and fatigue.  Otherwise she is at her baseline level of health and has no additional questions concerns or complaints today.  Full 10 point ROS is otherwise negative.  MEDICAL HISTORY:  Past Medical History:  Diagnosis Date   Adjustment disorder with mixed anxiety and depressed mood    Allergy    Carotid bruit    Depression    Dermatophytosis of foot    GERD (gastroesophageal reflux disease)    Hypertension    Insomnia    Neck pain    Osteoporosis     SURGICAL HISTORY: Past Surgical History:  Procedure Laterality Date   ABDOMINAL HYSTERECTOMY  1988   HEMORRHOID SURGERY     myoectomy  1984    SOCIAL HISTORY: Social History   Socioeconomic History   Marital status: Divorced    Spouse name: Not on file   Number of children: 0   Years of education: college   Highest education level: Bachelor's degree (e.g., BA, AB, BS)  Occupational History   Occupation: caregiver  Tobacco Use   Smoking status: Former    Types: Cigarettes   Smokeless tobacco: Never  Vaping Use   Vaping status: Never Used  Substance and Sexual Activity   Alcohol use: Not Currently    Comment: occasional wine   Drug use: No   Sexual activity: Not Currently  Other Topics Concern   Not on file  Social History Narrative   Lives alone, divorced   Was the caregiver for her Mother prior to her passing  in June 2023 who had dementia    2024 continues to live in her mother's home   Social Drivers of Health   Tobacco Use: Medium Risk (05/30/2024)   Patient History    Smoking Tobacco Use: Former    Smokeless Tobacco Use: Never    Passive Exposure: Not on file  Financial Resource Strain: Medium Risk (06/18/2023)   Overall Financial Resource Strain (CARDIA)    Difficulty of Paying Living Expenses: Somewhat hard  Food Insecurity: No Food Insecurity (07/05/2024)   Epic    Worried About Programme Researcher, Broadcasting/film/video in  the Last Year: Never true    Ran Out of Food in the Last Year: Never true  Transportation Needs: No Transportation Needs (07/05/2024)   Epic    Lack of Transportation (Medical): No    Lack of Transportation (Non-Medical): No  Physical Activity: Inactive (06/18/2023)   Exercise Vital Sign    Days of Exercise per Week: 0 days    Minutes of Exercise per Session: 0 min  Stress: Stress Concern Present (06/18/2023)   [employer] of Occupational Health - Occupational Stress Questionnaire    Feeling of Stress : To some extent  Social Connections: Unknown (06/18/2023)   Social Connection and Isolation Panel    Frequency of Communication with Friends and Family: Three times a week    Frequency of Social Gatherings with Friends and Family: Patient declined    Attends Religious Services: Patient declined    Active Member of Clubs or Organizations: No    Attends Banker Meetings: Never    Marital Status: Divorced  Catering Manager Violence: Not At Risk (07/05/2024)   Epic    Fear of Current or Ex-Partner: No    Emotionally Abused: No    Physically Abused: No    Sexually Abused: No  Depression (PHQ2-9): Low Risk (07/05/2024)   Depression (PHQ2-9)    PHQ-2 Score: 0  Alcohol Screen: Low Risk (01/16/2023)   Alcohol Screen    Last Alcohol Screening Score (AUDIT): 0  Housing: Unknown (07/05/2024)   Epic    Unable to Pay for Housing in the Last Year: No    Number of Times Moved in the Last Year: Not on file    Homeless in the Last Year: No  Utilities: Not At Risk (07/05/2024)   Epic    Threatened with loss of utilities: No  Health Literacy: Adequate Health Literacy (01/16/2023)   B1300 Health Literacy    Frequency of need for help with medical instructions: Never    FAMILY HISTORY: Family History  Problem Relation Age of Onset   Memory loss Mother    Colon polyps Father    Prostate cancer Father    ALS Father    Thyroid  disease Sister    Thyroid  nodules Sister         Thyroidectomy in 1984   Fibroids Sister    Hypertension Sister    Pancreatic cancer Maternal Grandfather    Liver cancer Paternal Grandmother    Stomach cancer Neg Hx    Colon cancer Neg Hx    Esophageal cancer Neg Hx     ALLERGIES:  has no known allergies.  MEDICATIONS:  Current Outpatient Medications  Medication Sig Dispense Refill   sertraline  (ZOLOFT ) 50 MG tablet Take one and a half tablets by mouth once daily (Patient taking differently: Take 75 mg by mouth daily. Take one and a half tablets by mouth once daily) 45 tablet 1   clorazepate  (TRANXENE -T) 7.5 MG  tablet Take one tablet at bedtime , as needed, for  uncontrolled anxiety , and poor sleep. Maximu is 4 times per week 30 tablet 3   hydrochlorothiazide  (HYDRODIURIL ) 25 MG tablet Take 1 tablet by mouth once daily 90 tablet 0   polyethylene glycol powder (GLYCOLAX/MIRALAX) 17 GM/SCOOP powder Take 17 g by mouth daily. Dissolve 1 capful (17g) in 4-8 ounces of liquid and take by mouth daily.     Potassium 99 MG TABS Take 99 mg by mouth 2 times daily at 12 noon and 4 pm.     rosuvastatin  (CRESTOR ) 5 MG tablet Take 1 tablet (5 mg total) by mouth daily. 90 tablet 3   Wheat Dextrin (BENEFIBER PO) Take 1 Dose by mouth daily.     No current facility-administered medications for this visit.    REVIEW OF SYSTEMS:   Constitutional: ( - ) fevers, ( - )  chills , ( - ) night sweats Eyes: ( - ) blurriness of vision, ( - ) double vision, ( - ) watery eyes Ears, nose, mouth, throat, and face: ( - ) mucositis, ( - ) sore throat Respiratory: ( - ) cough, ( - ) dyspnea, ( - ) wheezes Cardiovascular: ( - ) palpitation, ( - ) chest discomfort, ( - ) lower extremity swelling Gastrointestinal:  ( - ) nausea, ( - ) heartburn, ( - ) change in bowel habits Skin: ( - ) abnormal skin rashes Lymphatics: ( - ) new lymphadenopathy, ( - ) easy bruising Neurological: ( - ) numbness, ( - ) tingling, ( - ) new weaknesses Behavioral/Psych: ( - ) mood change,  ( - ) new changes  All other systems were reviewed with the patient and are negative.  PHYSICAL EXAMINATION:  Vitals:   07/05/24 1421  BP: (!) 149/77  Pulse: 88  Resp: 16  Temp: 98.3 F (36.8 C)  SpO2: 99%   Filed Weights   07/05/24 1421  Weight: 156 lb 1.6 oz (70.8 kg)    GENERAL: well appearing elderly African-American female in NAD  SKIN: skin color, texture, turgor are normal, no rashes or significant lesions EYES: conjunctiva are pink and non-injected, sclera clear LUNGS: clear to auscultation and percussion with normal breathing effort HEART: regular rate & rhythm and no murmurs and no lower extremity edema Musculoskeletal: no cyanosis of digits and no clubbing  PSYCH: alert & oriented x 3, fluent speech NEURO: no focal motor/sensory deficits  LABORATORY DATA:  I have reviewed the data as listed    Latest Ref Rng & Units 07/05/2024    2:51 PM 10/08/2023    5:23 PM 08/27/2023   11:57 AM  CBC  WBC 4.0 - 10.5 K/uL 7.6  6.2  5.6   Hemoglobin 12.0 - 15.0 g/dL 86.5  86.7  87.6   Hematocrit 36.0 - 46.0 % 39.4  39.4  36.6   Platelets 150 - 400 K/uL 215  223  205        Latest Ref Rng & Units 07/05/2024    2:51 PM 05/07/2024    4:22 PM 02/27/2024    4:08 PM  CMP  Glucose 70 - 99 mg/dL 74  71  86   BUN 8 - 23 mg/dL 10  14  9    Creatinine 0.44 - 1.00 mg/dL 9.17  9.14  9.23   Sodium 135 - 145 mmol/L 141  140  141   Potassium 3.5 - 5.1 mmol/L 3.8  3.6  3.5   Chloride 98 - 111 mmol/L  102  100  100   CO2 22 - 32 mmol/L 30  27  28    Calcium  8.9 - 10.3 mg/dL 89.9  9.8  89.9   Total Protein 6.5 - 8.1 g/dL 8.0     Total Bilirubin 0.0 - 1.2 mg/dL 0.5     Alkaline Phos 38 - 126 U/L 55     AST 15 - 41 U/L 32     ALT 0 - 44 U/L 51        ASSESSMENT & PLAN Tiffany Mendoza 74 y.o. female with medical history significant for an elevated serum free light chain ratio, previously followed by Dr. Rogers.   After review of the labs, review of the records, and discussion with  the patient the patients findings are most consistent with an elevated serum free light chain ratio without clear evidence of an underlying plasma cell disorder or monoclonal gammopathy.  Monoclonal Gammopathies are a group of medical conditions defined by the presence of a monoclonal protein (an M protein) in the blood or urine. Monoclonal gammopathies include monoclonal gammopathy of unknown significance (MGUS), Monoclonal gammopathies of renal or neurological significance,  smoldering multiple myeloma (SMM), multiple myeloma (MM), AL amyloidosis, and Waldenstrom macroglobulinemia. The goal of the initial workup is to determine which monoclonal gammopathy a patient has. The workup consists of evaluating protein in the serum (with serum protein electrophoresis (SPEP) and serum free light chains) , evaluating protein in the urine (UPEP), and evaluation of the skeleton (DG Bone Met Survey) to assure no lytic lesions. Baseline bloodwork includes CMP and CBC. If no CRAB criteria or high risk criteria are noted then the diagnosis is MGUS. MGUS must be followed with bloodwork periodically to assure it does not convert to multiple myeloma (occurs to approximately 1% of patients per year). If there are CRAB criteria or high risk features (such as elevated serum free light chain ratio (taking into account renal function), a non IgG M protein, or M protein >1.5) then a bone marrow biopsy must be pursued.    #Elevated Kappa Light Chains # Elevated Light Chain Ratio  --today will order an SPEP, UPEP, SFLC and beta 2 microglobulin --additionally will collect new baseline CBC, CMP, and LDH --Patient last had a DG bone met survey on 05/19/2022, no need to repeat at this time. --will consider the need for a bone marrow biopsy pending the above results --RTC in 12 months or sooner if intervention is required.    Orders Placed This Encounter  Procedures   CBC with Differential (Cancer Center Only)    Standing Status:    Future    Number of Occurrences:   1    Expiration Date:   07/05/2025   CMP (Cancer Center only)    Standing Status:   Future    Number of Occurrences:   1    Expiration Date:   07/05/2025   Lactate dehydrogenase (LDH)    Standing Status:   Future    Number of Occurrences:   1    Expiration Date:   07/05/2025   Multiple Myeloma Panel (SPEP&IFE w/QIG)    Standing Status:   Future    Number of Occurrences:   1    Expiration Date:   07/05/2025   Kappa/lambda light chains    Standing Status:   Future    Number of Occurrences:   1    Expiration Date:   07/05/2025   Beta 2 microglobulin    Standing Status:  Future    Number of Occurrences:   1    Expiration Date:   07/05/2025   24-Hr Ur UPEP/UIFE/Light Chains/TP    Standing Status:   Future    Expiration Date:   07/05/2025    All questions were answered. The patient knows to call the clinic with any problems, questions or concerns.  A total of more than 40 minutes were spent on this encounter with face-to-face time and non-face-to-face time, including preparing to see the patient, ordering tests and/or medications, counseling the patient and coordination of care as outlined above.   Norleen IVAR Kidney, MD Department of Hematology/Oncology [facility] [employer] Phone: [phone] Pager: [phone] Email: [email or web address]  07/07/2024 5:15 PM  "

## 2024-07-07 LAB — BETA 2 MICROGLOBULIN, SERUM: Beta-2 Microglobulin: 1.5 mg/L (ref 0.6–2.4)

## 2024-07-08 ENCOUNTER — Telehealth: Payer: Self-pay

## 2024-07-08 ENCOUNTER — Other Ambulatory Visit: Payer: Self-pay

## 2024-07-08 LAB — MULTIPLE MYELOMA PANEL, SERUM
Albumin SerPl Elph-Mcnc: 3.8 g/dL (ref 2.9–4.4)
Albumin/Glob SerPl: 1.2 (ref 0.7–1.7)
Alpha 1: 0.3 g/dL (ref 0.0–0.4)
Alpha2 Glob SerPl Elph-Mcnc: 1 g/dL (ref 0.4–1.0)
B-Globulin SerPl Elph-Mcnc: 1 g/dL (ref 0.7–1.3)
Gamma Glob SerPl Elph-Mcnc: 1.3 g/dL (ref 0.4–1.8)
Globulin, Total: 3.4 g/dL (ref 2.2–3.9)
IgA: 169 mg/dL (ref 64–422)
IgG (Immunoglobin G), Serum: 1240 mg/dL (ref 586–1602)
IgM (Immunoglobulin M), Srm: 48 mg/dL (ref 26–217)
Total Protein ELP: 7.2 g/dL (ref 6.0–8.5)

## 2024-07-08 LAB — KAPPA/LAMBDA LIGHT CHAINS
Kappa free light chain: 21.1 mg/L — ABNORMAL HIGH (ref 3.3–19.4)
Kappa, lambda light chain ratio: 2.27 — ABNORMAL HIGH (ref 0.26–1.65)
Lambda free light chains: 9.3 mg/L (ref 5.7–26.3)

## 2024-07-08 NOTE — Telephone Encounter (Signed)
 Returned TC to pt to address questions regarding 24 hour urine collection. Pt voiced she plans to bring the specimen to the lab this week.  Voiced understanding of discarding the 1st urination of the day to avoid including this in the collection.

## 2024-07-09 ENCOUNTER — Institutional Professional Consult (permissible substitution) (INDEPENDENT_AMBULATORY_CARE_PROVIDER_SITE_OTHER): Admitting: Otolaryngology

## 2024-07-10 ENCOUNTER — Other Ambulatory Visit: Payer: Self-pay | Admitting: Family Medicine

## 2024-07-11 ENCOUNTER — Other Ambulatory Visit: Payer: Self-pay | Admitting: Family Medicine

## 2024-07-11 MED ORDER — CLORAZEPATE DIPOTASSIUM 7.5 MG PO TABS: 7.5000 mg | ORAL_TABLET | Freq: Two times a day (BID) | ORAL | 5 refills | Status: AC | PRN

## 2024-07-12 ENCOUNTER — Telehealth: Payer: Self-pay

## 2024-07-12 NOTE — Telephone Encounter (Signed)
 Returned call to patient regarding her 24 hr urine collection.  Pt is dependent on assistance from a family member with transportation to get sample to the lab.  She voiced she would collect the urine sample and deliver as soon as she has transportation to deliver it to the lab.

## 2024-07-16 ENCOUNTER — Telehealth: Payer: Self-pay

## 2024-07-16 NOTE — Telephone Encounter (Signed)
 Spoke with patient and confirmed that our clinic will be open tomorrow.  She plans to deliver her 24 hr urine speciman tomorrow  to the lab.  Clarified with pt that her next appointment is 07/11/25 for labs and 07/18/25 to be seen by I. Thayil, PA-C.  Pt voiced understanding.

## 2024-07-17 ENCOUNTER — Other Ambulatory Visit: Payer: Self-pay | Admitting: *Deleted

## 2024-07-17 DIAGNOSIS — R7689 Other specified abnormal immunological findings in serum: Secondary | ICD-10-CM

## 2024-07-24 ENCOUNTER — Ambulatory Visit: Admitting: Podiatry

## 2024-08-01 ENCOUNTER — Institutional Professional Consult (permissible substitution) (INDEPENDENT_AMBULATORY_CARE_PROVIDER_SITE_OTHER): Admitting: Otolaryngology

## 2024-08-20 ENCOUNTER — Ambulatory Visit: Admitting: Family Medicine

## 2024-08-28 ENCOUNTER — Ambulatory Visit: Admitting: Podiatry

## 2025-07-11 ENCOUNTER — Inpatient Hospital Stay

## 2025-07-18 ENCOUNTER — Inpatient Hospital Stay: Admitting: Physician Assistant
